# Patient Record
Sex: Male | Born: 1965
Health system: Southern US, Community
[De-identification: ages and names within clinical notes are randomized; demographics above are authoritative.]

## PROBLEM LIST (undated history)

## (undated) DIAGNOSIS — J189 Pneumonia, unspecified organism: Secondary | ICD-10-CM

## (undated) DIAGNOSIS — D649 Anemia, unspecified: Secondary | ICD-10-CM

## (undated) DIAGNOSIS — E785 Hyperlipidemia, unspecified: Secondary | ICD-10-CM

## (undated) DIAGNOSIS — Z8739 Personal history of other diseases of the musculoskeletal system and connective tissue: Secondary | ICD-10-CM

## (undated) DIAGNOSIS — L4050 Arthropathic psoriasis, unspecified: Secondary | ICD-10-CM

## (undated) HISTORY — DX: Arthropathic psoriasis, unspecified: L40.50

## (undated) HISTORY — DX: Hyperlipidemia, unspecified: E78.5

## (undated) HISTORY — DX: Personal history of other diseases of the musculoskeletal system and connective tissue: Z87.39

## (undated) HISTORY — PX: JOINT REPLACEMENT: SHX530

---

## 1999-08-06 ENCOUNTER — Emergency Department (HOSPITAL_COMMUNITY): Admission: EM | Admit: 1999-08-06 | Discharge: 1999-08-06 | Payer: Self-pay | Admitting: Emergency Medicine

## 1999-08-06 ENCOUNTER — Encounter: Payer: Self-pay | Admitting: Emergency Medicine

## 2001-05-02 DIAGNOSIS — L4050 Arthropathic psoriasis, unspecified: Secondary | ICD-10-CM | POA: Insufficient documentation

## 2004-07-29 ENCOUNTER — Encounter (INDEPENDENT_AMBULATORY_CARE_PROVIDER_SITE_OTHER): Payer: Self-pay | Admitting: *Deleted

## 2004-07-29 ENCOUNTER — Inpatient Hospital Stay (HOSPITAL_COMMUNITY): Admission: RE | Admit: 2004-07-29 | Discharge: 2004-08-02 | Payer: Self-pay | Admitting: Orthopedic Surgery

## 2004-10-04 ENCOUNTER — Inpatient Hospital Stay (HOSPITAL_COMMUNITY): Admission: RE | Admit: 2004-10-04 | Discharge: 2004-10-07 | Payer: Self-pay | Admitting: Orthopedic Surgery

## 2008-06-24 ENCOUNTER — Ambulatory Visit: Payer: Self-pay | Admitting: Family Medicine

## 2008-06-24 LAB — CONVERTED CEMR LAB
Basophils Absolute: 0 10*3/uL (ref 0.0–0.1)
Basophils Relative: 0.5 % (ref 0.0–3.0)
Bilirubin Urine: NEGATIVE
Bilirubin, Direct: 0.1 mg/dL (ref 0.0–0.3)
Calcium: 9.5 mg/dL (ref 8.4–10.5)
Chloride: 107 meq/L (ref 96–112)
Cholesterol: 201 mg/dL (ref 0–200)
Creatinine, Ser: 0.9 mg/dL (ref 0.4–1.5)
Direct LDL: 133.7 mg/dL
GFR calc non Af Amer: 97 mL/min
Glucose, Urine, Semiquant: NEGATIVE
HCT: 47.8 % (ref 39.0–52.0)
Ketones, urine, test strip: NEGATIVE
MCHC: 34.6 g/dL (ref 30.0–36.0)
MCV: 91.5 fL (ref 78.0–100.0)
Neutro Abs: 3.9 10*3/uL (ref 1.4–7.7)
Platelets: 147 10*3/uL — ABNORMAL LOW (ref 150–400)
RBC: 5.22 M/uL (ref 4.22–5.81)
Sodium: 144 meq/L (ref 135–145)
TSH: 0.81 microintl units/mL (ref 0.35–5.50)
Total CHOL/HDL Ratio: 5.4
WBC Urine, dipstick: NEGATIVE
WBC: 5.2 10*3/uL (ref 4.5–10.5)

## 2008-07-03 ENCOUNTER — Ambulatory Visit: Payer: Self-pay | Admitting: Family Medicine

## 2008-07-03 DIAGNOSIS — M87059 Idiopathic aseptic necrosis of unspecified femur: Secondary | ICD-10-CM | POA: Insufficient documentation

## 2008-07-03 DIAGNOSIS — E785 Hyperlipidemia, unspecified: Secondary | ICD-10-CM

## 2008-07-03 DIAGNOSIS — E669 Obesity, unspecified: Secondary | ICD-10-CM | POA: Insufficient documentation

## 2008-07-12 ENCOUNTER — Emergency Department (HOSPITAL_COMMUNITY): Admission: EM | Admit: 2008-07-12 | Discharge: 2008-07-12 | Payer: Self-pay | Admitting: Emergency Medicine

## 2010-03-10 ENCOUNTER — Encounter: Payer: Self-pay | Admitting: Family Medicine

## 2010-03-10 ENCOUNTER — Ambulatory Visit: Payer: Self-pay | Admitting: Family Medicine

## 2010-06-01 ENCOUNTER — Telehealth: Payer: Self-pay | Admitting: Internal Medicine

## 2010-06-01 NOTE — Assessment & Plan Note (Signed)
Summary: high bp 160/100/cjr   Vital Signs:  Patient profile:   45 year old male Weight:      236 pounds Temp:     98.4 degrees F oral BP sitting:   120 / 60  (left arm) Cuff size:   large  Vitals Entered By: Sid Falcon LPN (March 10, 2010 4:50 PM)  History of Present Illness: Patient seen with episode of atypical chest symptoms last night. This occurred at home at rest. He described "prickly sensation" anterior chest without radiation which lasted about 10 minutes. He denied any tightness, heaviness, chest wall soreness, dyspnea, nausea, or vomiting. No radiation of symptoms. No recurrent symptoms today.  Patient also noted elevated blood pressure the home machine of around 160/100 last night. His wife is an Charity fundraiser who took his blood pressure. No history of hypertension. Denies any recent headaches or dizziness. He does drink excessive caffeine with approximately 60 ounces of coffee per day. No regular alcohol use.  no family history of premature coronary artery disease. Nonsmoker. Mild dyslipidemia. No history of diabetes.  Allergies (verified): No Known Drug Allergies  Past History:  Past Medical History: Last updated: 07/03/2008 Hyperlipidemia, slight elevation Arthritis Anemia in the past avascular necrosis of the hips.  Past Surgical History: Last updated: 07/03/2008 Total hip replacement left 2006 Total hip replacement right 2007  Family History: Last updated: 07/03/2008 Glaucoma, mother father, died of head trauma complications at age 40  Social History: Last updated: 07/03/2008 Occupation:  Occupational psychologist Never Smoked Alcohol use-yes, occasionally Drug use-no Regular exercise-yes, recently started  Risk Factors: Exercise: yes (07/03/2008)  Risk Factors: Smoking Status: never (07/03/2008) PMH-FH-SH reviewed for relevance  Review of Systems  The patient denies anorexia, fever, weight loss, hoarseness, syncope, dyspnea on exertion, peripheral edema,  prolonged cough, headaches, hemoptysis, abdominal pain, melena, hematochezia, and severe indigestion/heartburn.    Physical Exam  General:  Well-developed,well-nourished,in no acute distress; alert,appropriate and cooperative throughout examination Mouth:  Oral mucosa and oropharynx without lesions or exudates.  Teeth in good repair. Neck:  No deformities, masses, or tenderness noted. Chest Wall:  No deformities, masses, tenderness or gynecomastia noted. Lungs:  Normal respiratory effort, chest expands symmetrically. Lungs are clear to auscultation, no crackles or wheezes. Heart:  Normal rate and regular rhythm. S1 and S2 normal without gallop, murmur, click, rub or other extra sounds. Abdomen:  soft, non-tender, and no masses.   Extremities:  No clubbing, cyanosis, edema, or deformity noted with normal full range of motion of all joints.     Impression & Recommendations:  Problem # 1:  CHEST PAIN, ATYPICAL (ICD-786.59) Assessment New  EKG no acute findings.  Transient elev BP by reading last night ?anxiety but normal today.  Orders: EKG w/ Interpretation (93000)  Problem # 2:  ELEVATED BLOOD PRESSURE (ICD-796.2) Assessment: New as above.  Rec gradual reduction in caffeine. Continue to monitor.  Patient Instructions: 1)  Your blood pressure is normal today 120/60. 2)  EKG shows no acute findings. 3)  Follow up promptly if you notice any exertion related chest pain or increased shortness of breath.   Orders Added: 1)  Est. Patient Level IV [61607] 2)  EKG w/ Interpretation [93000]

## 2010-06-02 ENCOUNTER — Telehealth: Payer: Self-pay | Admitting: *Deleted

## 2010-06-02 NOTE — Telephone Encounter (Signed)
Spoke with wife.  He apparently had Tamiflu called in last night though Hasn't started today.  He worked today.  Advised to f/u here if symptoms worsen or persist

## 2010-06-02 NOTE — Telephone Encounter (Signed)
[facility] Triage Call Report Triage Record Num: [id number] Operator: Martie Lee Long Patient Name: [name] Call Date & Time: 06/01/2010 6:01:33PM Patient Phone: [phone] PCP: Evelena Peat Patient Gender: Male PCP Fax : [phone] Patient DOB: 21-Dec-1965 Practice Name: [facility] Reason for Call: Judeth Porch calling about fever, body aches and headaches. Onset 05/31/10. Temp has not been taken. Wife informed that she is not currently at home with patient. Instructed that triage needed to be while with patient. Called [name]/Pt at number given by wife [phone]) no answer, left message to call office line back. Triage not able to be completed. No triage. Protocol(s) Used: Office Note Recommended Outcome per Protocol: Information Noted and Sent to Office Reason for Outcome: Caller information to office Care Advice: ~ 06/01/2010 6:13:50PM Page 1 of 1 [email or web address]_[email or web address]

## 2010-06-09 NOTE — Progress Notes (Signed)
  Phone Note Call from Patient   Caller: Dad Call For: Evelena Peat MD Summary of Call: Karin Golden Friendly Feels like he is getting the flu........Marland Kitchenfever last night, doesn't know how high, and headache with mylagias.  Wants Tamiflu. [phone]  Initial call taken by: Lynann Beaver CMA AAMA,  June 01, 2010 4:48 PM  Follow-up for Phone Call        75 mg  #10 Follow-up by: Gordy Savers  MD,  June 02, 2010 7:32 AM    New/Updated Medications: TAMIFLU 75 MG CAPS (OSELTAMIVIR PHOSPHATE) one by mouth daily Prescriptions: TAMIFLU 75 MG CAPS (OSELTAMIVIR PHOSPHATE) one by mouth daily  #10 x 0   Entered by:   Lynann Beaver CMA AAMA   Authorized by:   Gordy Savers  MD   Signed by:   Lynann Beaver CMA AAMA on 06/02/2010   Method used:   Electronically to        [facility] (retail)       [address]       [place], [place]  [address]       Ph: [phone]       Fax: [phone]   RxID:   [id number]  Left pt message.

## 2010-09-17 NOTE — Op Note (Signed)
[name], [name]                ACCOUNT NO.:  [id number]   MEDICAL RECORD NO.:  [id number]          PATIENT TYPE:  INP   LOCATION:  X002                         FACILITY:  [facility]   PHYSICIAN:  Ollen Gross, M.D.    DATE OF BIRTH:  01/17/1966   DATE OF PROCEDURE:  07/29/2004  DATE OF DISCHARGE:                                 OPERATIVE REPORT   PREOPERATIVE DIAGNOSIS:  Osteoarthritis left hip.   POSTOPERATIVE DIAGNOSIS:  Osteoarthritis left hip.   PROCEDURE:  Left total hip arthroplasty.   SURGEON:  Ollen Gross, M.D.   ASSISTANT:  Alexzandrew L. Julien Girt, P.A.   ANESTHESIA:  Spinal, which was then converted to general.   ESTIMATED BLOOD LOSS:  200.   DRAIN:  Hemovac x1.   COMPLICATIONS:  None.   CONDITION:  Stable to recovery.   BRIEF CLINICAL NOTE:  [name] is a 45 year old male who has severe arthritic  change of both hips.  He has a history of rheumatoid arthritis.  He has end-  stage arthritic change and has had intractable pain.  He presents now for  left total hip arthroplasty, as the left is more symptomatic than the right.   PROCEDURE IN DETAIL:  After successful administration of spinal anesthetic,  a Foley catheter was placed, and the patient was placed on the right lateral  decubitus position, with the left side up and held with the hip positioner.  Left lower extremity was isolated from his perineum with plastic drapes and  prepped and draped in the usual sterile fashion.  A short posterolateral  incision was made with a 10 blade through subcutaneous tissue.  The patient  felt the incision, and thus his anesthetic was converted to a general via  LMA.  Skin was cut to the subcutaneous tissue to the fascia lata.  The  fascia lata was incised in line with the skin incision.  Sciatic nerve was  palpated and protected, and the short external rotators isolated off the  femur.  Capsulectomy was performed, and the hip was dislocated.  The center  of the femoral  head was marked and a trial prosthesis placed such that the  center of the trial head corresponds to the center of his native femoral  head.  Osteotomy line was marked on the femoral neck, and osteotomy was made  with an oscillating saw.  The femoral head was removed and then the femur  retracted anteriorly to gain acetabular exposure.  On inspecting the femoral  head, he had barely any cartilage left, and the cartilage that was left was  stained black.  We subsequently sent the femoral head as a pathologic  specimen to analyze this.  Once acetabular exposure was obtained and the  labrum was removed, reaming was performed starting at 49 mm, coursing in  increments of 2 mm up to 59 mm.  A 60 mm Pinnacle acetabular shell was then  placed in anatomic position.  We had excellent press fit.  I did not need to  use any supplemental screws.  A trial 36 mm neutral liner was placed.  The  femur was then prepared with the canal finder and irrigation.  Axial reaming  was performed to 13.5 mm, proximal reaming to an 57F, and the sleeve machine  to a large.  An 57F large trial sleeve was placed, with an 18 x 13 stem, a  36 +8 neck, matching his native anteversion.  A 36 +0 head was placed, and  the hip was reduced, with excellent stability with full extension, full  external rotation, 70 degrees flexion, 40 degrees adduction, 90 degrees  internal rotation, and 90 degrees flexion, 90 degrees internal rotation.  Placing the left leg on top of the right, leg lengths were found to be  equal.  The hip was then dislocated, and all trials were removed.  The  permanent Apex hole eliminator was placed into the acetabular shell.  Then,  the permanent 36 mm neutral Ultamet metal liner was placed.  It was a metal  on metal hip replacement.  In the femur, the permanent 57F large sleeve was  placed with the 18 x 13 stem and a 36 +8 neck, matching his native  anteversion, 36 +0 head was placed, and the hip was reduced  with the same  stability parameters.  The wound was copiously irrigated with saline  solution, and the short rotators were reattached to the femur through drill  holes.  Fascia lata was closed over a Hemovac drain with interrupted #1  Vicryl.  Subcutaneous closed with #1 and 2-0 Vicryl, and subcuticular  running 4-0 Monocryl.  The incision was cleaned and dried, and Steri-Strips  and a bulky sterile dressing applied.  He was then placed into a knee  immobilizer, drains hooked to suction, and he was awakened and transported  to recovery in stable condition.      FA/MEDQ  D:  07/29/2004  T:  07/29/2004  Job:  [id number]

## 2010-09-17 NOTE — Discharge Summary (Signed)
NAMEKYLAN, LIBERATI                ACCOUNT NO.:  [id number]   MEDICAL RECORD NO.:  [id number]          PATIENT TYPE:  INP   LOCATION:  0469                         FACILITY:  [facility]   PHYSICIAN:  Ollen Gross, M.D.    DATE OF BIRTH:  [birth year]   DATE OF ADMISSION:  07/29/2004  DATE OF DISCHARGE:                                 DISCHARGE SUMMARY   ADMITTING DIAGNOSES:  1.  Bilateral hips end-stage osteoarthritis, left greater than right.  2.  Rheumatoid arthritis.   DISCHARGE DIAGNOSES:  1.  Osteoarthritis left hip status post left total hip replacement      arthroplasty.  2.  Bilateral hips end-stage osteoarthritis, left greater than right.      1.  Rheumatoid arthritis.   PROCEDURES:  July 29, 2004 - left total hip. Surgeon:  Dr. Lequita Halt.  Assistant:  Avel Peace, P.A.-C. Anesthesia:  Spinal, converted to general.  Blood loss 200 mL. Hemovac drain x1.   CONSULTS:  None.   BRIEF HISTORY:  Nieko is a 45 year old male with severe arthritic changes in  both hips. He has a history of rheumatoid arthritis, end-stage changes with  intractable pain, and now presents for a left total hip; left is more  symptomatic than right.   LABORATORY DATA:  CBC on admission:  Hemoglobin 15.8, hematocrit of 46.4,  white cell count 4.6, differential within normal limits. Postoperative  hemoglobin 13.8. Last noted H&H 13.1 and 37.7. PT/PTT preoperatively 11.5  and 28 respectively. INR 0.8. Last noted PT/INR 17.9 and 1.8. Chem panel on  admission all within normal limits. Serial BMETs were followed. Sodium  dropped from 135 to 133. Calcium dropped from 9.0 to 7.9. Urinalysis  preoperatively negative. Blood group/type O positive.   X-rays:  Pelvis:  Advanced severe arthritis right hip status post left total  hip arthroplasty. Left hip film:  Status post left hip arthroplasty.  Preoperative chest:  No active disease. Preoperative left hip film:  Severe  bilateral hip arthritis.   EKG dated  July 23, 2004:  Normal sinus rhythm with occasional premature  ectopic complexes, confirmed by Dr. Lenise Herald.   HOSPITAL COURSE:  Admitted to [facility], taken to the OR,  underwent the above procedure without complication. The patient tolerated  the procedure well, later to the recovery room and then the orthopedic floor  with continued postoperative care. Hemovac drain placed at the time of  surgery pulled on day #1. He was placed on PCA and p.o. analgesics, had some  pain on day #1. He was started on therapy, started to get up with therapy  very well by day #2. He was weaned over to p.o. medications and IV fluids  and PCA and Foley were discontinued. Dressing change was done on day #2.  Incision was healing well. Continued to progress well with his therapy on  day #3 and by Monday - August 02, 2004 - the patient did well with therapy  through the weekend, seen back on rounds by Dr. Lequita Halt, doing well, and was  discharged home.   DISCHARGE MEDICATIONS/PLAN:  1.  Discharged  home on August 02, 2004.  2.  Discharge diagnoses:  Please see above.  3.  Discharge medications:  Coumadin, Percocet, Robaxin. Recommend over-the-      counter Colace.  4.  Follow up 2 weeks from surgery.  5.  Activity:  Partial weightbearing left lower extremity 25-50%. Home      health PT, home health nursing. Total hip protocol.   FOLLOW-UP:  Two weeks from the day of surgery. Please contact the office at  [phone].   DIET:  As tolerated.   DISPOSITION:  Home.   CONDITION UPON DISCHARGE:  Improved.      ALP/MEDQ  D:  08/02/2004  T:  08/02/2004  Job:  [id number]

## 2010-09-17 NOTE — Op Note (Signed)
[name], [name]                ACCOUNT NO.:  [id number]   MEDICAL RECORD NO.:  [id number]          PATIENT TYPE:  INP   LOCATION:  0009                         FACILITY:  [facility]   PHYSICIAN:  Ollen Gross, M.D.    DATE OF BIRTH:  20-May-1965   DATE OF PROCEDURE:  10/04/2004  DATE OF DISCHARGE:                                 OPERATIVE REPORT   PREOPERATIVE DIAGNOSIS:  Rheumatoid arthritis of the right hip.   POSTOPERATIVE DIAGNOSIS:  Rheumatoid arthritis of the right hip.   PROCEDURE:  Right total hip arthroplasty.   SURGEON:  Ollen Gross, M.D.   ASSISTANT:  Alexzandrew L. Julien Girt, P.A.   ANESTHESIA:  General.   ESTIMATED BLOOD LOSS:  450.   DRAIN:  Hemovac x1.   COMPLICATIONS:  None.   CONDITION ON DISCHARGE:  Stable to recovery.   BRIEF CLINICAL NOTE:  [name] is a 45 year old male who has had severe end-  stage arthritis of both hips with intractable pain. He has had a successful  left total hip arthroplasty approximately two months ago and presents now  for right total hip arthroplasty.   PROCEDURE IN DETAIL:  After successful administration of general anesthetic,  the patient was placed in the left lateral decubitus position with his right  side up and held with the hip positioner. The right lower extremity was  isolated from his perineum with plastic drapes and prepped and draped in the  usual sterile fashion. A short posterolateral incision was made with a 10  blade through subcutaneous tissue to the level of fascia lata which was  incised in line with the skin incision. The sciatic nerve was palpated and  protected, and the short rotators isolated off the femur. Capsulectomy was  performed, and the hip was dislocated. The center of the femoral head was  marked and a trial prosthesis placed at the center of the trial head and  corresponds to the center of his native femoral head. Osteotomy lines were  marked on the femoral neck, and osteotomy made with an  oscillating saw. The  femur was retracted anteriorly to gain acetabular exposure.   Acetabular osteophytes and labrum were removed. Reaming starts at 53 in  coursing increments up to a 59 and then a 60-mm Pinnacle acetabular shell  was placed in anatomic position and transfixed with two dome screws. A trial  36-mm neutral liner was placed.   The femur was then prepared with the canal finder and irrigation. Axial  reaming was performed to 13.5 mm. Proximal reaming was then performed to a  18 and the sleeve machine to a large.  An 18-F large trial sleeve was placed  and an 18 x 13 stem with 36+8 neck matching his native anteversion. A 36-0  head was placed, and the hip was reduced with outstanding stability. Full  extension, full external rotation to 70 degrees, flexion to 40 degrees  ,adduction to 90 degrees, and  internal rotation to 90 degrees, flexion to  70 degrees internal rotation. The hip was then dislocated and the trials  removed. The 36-mm neutral Ultamet metal liner  was placed. It was a metal-on-  metal hip replacement. The 18-F large sleeve was then placed in the proximal  femur with an 18 x 13 stem and a 36+8 neck matching his native anteversion.  The permanent 36+0 head was then placed. The hip was reduced with the same  stability parameters. The wound was copiously irrigated with saline solution  and the short rotators reattached to the femur with drill holes. The fascia  was closed over a Hemovac drain with interrupted #1 Vicryl, subcutaneous  tissue closed with #1 then 2-0 Vicryl and subcuticular running 4-0 Monocryl.  The incision was cleaned and dried. Thirty cc of 0.25% Marcaine with  epinephrine were injected into the subcutaneous tissues. Steri-Strips and a  bulky sterile dressing were applied. Drain was hooked to suction. He was  then placed into a knee immobilizer, awakened, and transported to recovery  in stable condition.       FA/MEDQ  D:  10/04/2004  T:   10/04/2004  Job:  [id number]

## 2010-09-17 NOTE — H&P (Signed)
[name], Joshua                ACCOUNT NO.:  [id number]   MEDICAL RECORD NO.:  [id number]          PATIENT TYPE:  INP   LOCATION:  NA                           FACILITY:  [facility]   PHYSICIAN:  Ollen Gross, M.D.    DATE OF BIRTH:  11/10/1965   DATE OF ADMISSION:  07/29/2004  DATE OF DISCHARGE:                                HISTORY & PHYSICAL   REASON FOR ADMISSION:  Left hip pain.   HISTORY OF PRESENT ILLNESS:  [name] is a 45 year old male who has a 3-4 year  history of progressively worsening dysfunction in regard to his hips.  Currently his left hip is hurting more than the right. He never had any  specific injury leading to this. He has a history of rheumatoid arthritis.  He has developed significant stiffness in his hips. It is difficult for him  to do things such as tie his shoes, get in and out of the car, go up and  down stairs.  Stiffness is more limiting in pain originally but now the pain  is just as bad as the stiffness. 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  He is at a stage  now where it is negatively affecting his life and he is here to pursue total  hip arthroplasty.   PAST MEDICAL HISTORY:  Significant for rheumatoid arthritis.   PAST SURGICAL HISTORY:  Noncontributory.   CURRENT MEDICATIONS:  Daypro.   ALLERGIES:  He has no known drug allergies.   FAMILY HISTORY:  Not significant for any inflammatory arthritis or arthritis  at an early age.   SOCIAL HISTORY:  He is married, lives in a townhome with two floors and 14  stairs.   REVIEW OF SYMPTOMS:  Negative with the exception of one bout of pneumonia  several years ago.   PHYSICAL EXAMINATION:  GENERAL:  Well-developed male, alert and oriented in  no apparent distress.  VITAL SIGNS:  Temperature 98.6, pulse 75, respirations 16, blood pressure  120/80.  HEENT:  Within normal limits.  NECK:  Supple without adenopathy or thyromegaly.  LUNGS:   Clear to auscultation bilaterally.  CARDIAC:  Shows a regular rate and rhythm, no murmurs, rubs or gallops.  ABDOMEN:  Nontender, nondistended with positive bowel sounds and no  organomegaly.  MUSCULOSKELETAL:  Specifically the hips shows right hip flexion to 80, no  internal rotation, 20 degrees external rotation, 20 degrees of abduction.  Left hip flexion is 80, no internal or external rotation only 20 degrees of  abduction. He has intact strength, sensation and pulses both lower  extremities. Walks with a very stiff legged gait pattern.   Radiographs show an AP pelvis and lateral of his hips, end-stage arthritis  both hips, bone on bone throughout.   ASSESSMENT:  Bilateral hip pain, he has got end-stage arthritis of both  hips. At this point, the most predictable means of improving pain and  function will be a total hip arthroplasty. The left is currently more  symptomatic than the right. He presents now  for left total hip arthroplasty.  The procedure risks, potential complications and rehab course have been  discussed in detail and he elects to proceed.      FA/MEDQ  D:  07/09/2004  T:  07/09/2004  Job:  [id number]

## 2010-09-17 NOTE — H&P (Signed)
[name], [name]                ACCOUNT NO.:  [id number]   MEDICAL RECORD NO.:  [id number]          PATIENT TYPE:  INP   LOCATION:  NA                           [facility]ACILITY:  [facility]   PHYSICIAN:  Ollen Gross, M.D.    DATE OF BIRTH:  [birth year]   DATE OF ADMISSION:  10/04/2004  DATE OF DISCHARGE:                                HISTORY & PHYSICAL   CHIEF COMPLAINT:  Right hip pain.   HISTORY OF PRESENT ILLNESS:  The patient is a 44 year old male who has been  seen by Dr. Lequita Halt for ongoing hip arthritis. He has previously undergone a  left total hip arthroplasty back in the end of March and the first of April  of this year and has been doing quite well with regards to the left hip. He  has continued pain on the right side. He was known to have end stage  arthritis in both hips. He has successfully undergone a left total hip and  now presents for the right total hip replacement. Risks and benefits  discussed. The patient is subsequently admitted to the hospital.   ALLERGIES:  No known drug allergies.   CURRENT MEDICATIONS:  Tylenol.   PAST MEDICAL HISTORY:  Rheumatoid arthritis.   PAST SURGICAL HISTORY:  Left total hip arthroplasty July 29, 2004.   FAMILY HISTORY:  Mother with history of glaucoma. She is 19 years old.   SOCIAL HISTORY:  He is married. His wife works as a Engineer, civil (consulting) for [employer]. He works as a Occupational psychologist. Non-smoker. No alcohol. One child. Wife  will be assisting with care after surgery.   REVIEW OF SYSTEMS:  GENERAL:  No fever, chills, night sweats. NEUROLOGIC:  No seizures, syncope, or paralysis. RESPIRATORY:  No shortness of breath,  productive cough, or hemoptysis. CARDIOVASCULAR:  No chest pain, angina, or  orthopnea. GASTROINTESTINAL:  No nausea, vomiting, diarrhea, constipation.  GENITOURINARY:  No dysuria, hematuria, or discharge. MUSCULOSKELETAL:  Right  hip, history of present illness.   PHYSICAL EXAMINATION:  VITAL SIGNS:  Pulse 64,  respiratory rate 12, blood  pressure 122/78.  GENERAL:  A 45 year old white male, well developed, well nourished, in no  acute distress. Alert, oriented and cooperative.  HEENT:  Normocephalic and atraumatic. Pupils are equal, round, and reactive.  Oropharynx clear.  NECK:  Supple.  CHEST:  Clear anterior and posterior chest walls.  HEART:  Regular rate and rhythm. No murmur or S1 and S2 noted.  ABDOMEN:  Soft, nontender. Bowel sounds present.  RECTAL/BREAST/GENITALIA:  Not done. Not pertinent to present illness.  EXTREMITIES:  Right hip flexion of only about 85 to 90 degrees. There is no  internal rotation, about 10 degrees of external rotation, and about 10  degrees of abduction with antalgic gait.   IMPRESSION:  1.  Rheumatoid arthritis, right hip.  2.  Status post left total hip replacement arthroplasty March of 2006.   PLAN:  The patient admitted to [facility] to undergo a  right total hip arthroplasty. The surgery will be performed by Dr. Ollen Gross.  ALP/MEDQ  D:  10/03/2004  T:  10/03/2004  Job:  [id number]   cc:   [facility]

## 2011-03-31 ENCOUNTER — Other Ambulatory Visit (HOSPITAL_COMMUNITY): Payer: Self-pay | Admitting: Rheumatology

## 2011-03-31 ENCOUNTER — Ambulatory Visit (HOSPITAL_COMMUNITY)
Admission: RE | Admit: 2011-03-31 | Discharge: 2011-03-31 | Disposition: A | Discharge status: Home / Self Care | Payer: 59 | Source: Ambulatory Visit | Attending: Rheumatology | Admitting: Rheumatology

## 2011-03-31 DIAGNOSIS — Z9225 Personal history of immunosupression therapy: Secondary | ICD-10-CM

## 2011-04-01 ENCOUNTER — Other Ambulatory Visit (HOSPITAL_BASED_OUTPATIENT_CLINIC_OR_DEPARTMENT_OTHER): Payer: Self-pay | Admitting: Rheumatology

## 2011-04-01 DIAGNOSIS — M542 Cervicalgia: Secondary | ICD-10-CM

## 2011-04-05 ENCOUNTER — Other Ambulatory Visit (HOSPITAL_BASED_OUTPATIENT_CLINIC_OR_DEPARTMENT_OTHER): Payer: 59

## 2011-04-12 ENCOUNTER — Ambulatory Visit (HOSPITAL_BASED_OUTPATIENT_CLINIC_OR_DEPARTMENT_OTHER)
Admission: RE | Admit: 2011-04-12 | Discharge: 2011-04-12 | Disposition: A | Discharge status: Home / Self Care | Payer: 59 | Source: Ambulatory Visit | Attending: Rheumatology | Admitting: Rheumatology

## 2011-04-12 DIAGNOSIS — M5030 Other cervical disc degeneration, unspecified cervical region: Secondary | ICD-10-CM

## 2011-04-12 DIAGNOSIS — M542 Cervicalgia: Secondary | ICD-10-CM | POA: Insufficient documentation

## 2011-04-12 DIAGNOSIS — R209 Unspecified disturbances of skin sensation: Secondary | ICD-10-CM

## 2011-04-12 DIAGNOSIS — M5020 Other cervical disc displacement, unspecified cervical region: Secondary | ICD-10-CM | POA: Insufficient documentation

## 2011-07-22 ENCOUNTER — Other Ambulatory Visit (HOSPITAL_BASED_OUTPATIENT_CLINIC_OR_DEPARTMENT_OTHER): Payer: Self-pay | Admitting: Urology

## 2011-07-22 DIAGNOSIS — R3129 Other microscopic hematuria: Secondary | ICD-10-CM

## 2011-07-23 ENCOUNTER — Ambulatory Visit (HOSPITAL_BASED_OUTPATIENT_CLINIC_OR_DEPARTMENT_OTHER)
Admission: RE | Admit: 2011-07-23 | Discharge: 2011-07-23 | Disposition: A | Discharge status: Home / Self Care | Payer: 59 | Source: Ambulatory Visit | Attending: Urology | Admitting: Urology

## 2011-07-23 DIAGNOSIS — R3129 Other microscopic hematuria: Secondary | ICD-10-CM

## 2011-07-30 ENCOUNTER — Ambulatory Visit (HOSPITAL_BASED_OUTPATIENT_CLINIC_OR_DEPARTMENT_OTHER)
Admission: RE | Admit: 2011-07-30 | Discharge: 2011-07-30 | Disposition: A | Discharge status: Home / Self Care | Payer: 59 | Source: Ambulatory Visit | Attending: Urology | Admitting: Urology

## 2011-07-30 DIAGNOSIS — Q619 Cystic kidney disease, unspecified: Secondary | ICD-10-CM | POA: Insufficient documentation

## 2011-07-30 DIAGNOSIS — R3129 Other microscopic hematuria: Secondary | ICD-10-CM | POA: Insufficient documentation

## 2011-07-30 DIAGNOSIS — N281 Cyst of kidney, acquired: Secondary | ICD-10-CM

## 2011-07-30 DIAGNOSIS — R9389 Abnormal findings on diagnostic imaging of other specified body structures: Secondary | ICD-10-CM | POA: Insufficient documentation

## 2011-07-30 DIAGNOSIS — K5730 Diverticulosis of large intestine without perforation or abscess without bleeding: Secondary | ICD-10-CM | POA: Insufficient documentation

## 2011-07-30 DIAGNOSIS — N289 Disorder of kidney and ureter, unspecified: Secondary | ICD-10-CM

## 2011-07-30 MED ORDER — IOHEXOL 300 MG/ML  SOLN
125.0000 mL | Freq: Once | INTRAMUSCULAR | Status: AC | PRN
  Administered 2011-07-30: 125 mL via INTRAVENOUS

## 2012-04-13 ENCOUNTER — Ambulatory Visit (INDEPENDENT_AMBULATORY_CARE_PROVIDER_SITE_OTHER): Payer: Self-pay | Admitting: Pharmacist

## 2012-04-13 ENCOUNTER — Encounter: Payer: Self-pay | Admitting: Pharmacist

## 2012-04-13 VITALS — BP 157/86 | HR 73 | Ht 69.25 in | Wt 248.2 lb

## 2012-04-13 DIAGNOSIS — L4050 Arthropathic psoriasis, unspecified: Secondary | ICD-10-CM

## 2012-04-13 NOTE — Progress Notes (Signed)
  Subjective:    Patient ID: [name], male    DOB: May 19, 1965, 46 y.o.   MRN: [id number]  HPI Patient arrives in good spirits for medication review.  He is seen late in the day at 5:15PM as he is self-employed and finds it difficult to come in during the business day.  Reports seeing Evelena Peat as primary care provider at [facility], Dr. Titus Dubin as rheumatologist.  Reports being diagnosed with psoriatic arthritis for about 10 years. He had previously been diagnosed with rheumatoid arthritis since his teens.   He states he is currently under an acceptable level of control.      Review of Systems     Objective:   Physical Exam        Assessment & Plan:  Following medication review, no suggestions for change.  Complete medication list provided to patient.  Total time in face to face medication review:  15 minutes.  Patient seen with: Juanita Craver, PharmD Candidate.

## 2012-04-13 NOTE — Patient Instructions (Signed)
Thanks for coming in today.   Your medications should be eligible for lower cost after this visit.

## 2012-04-16 NOTE — Progress Notes (Signed)
  Subjective:    Patient ID: [name], male    DOB: 02-17-1966, 46 y.o.   MRN: [id number]  HPI Reviewed and agree with Dr. Macky Lower documentation and management.   Review of Systems     Objective:   Physical Exam        Assessment & Plan:

## 2012-07-03 ENCOUNTER — Encounter (INDEPENDENT_AMBULATORY_CARE_PROVIDER_SITE_OTHER): Payer: Self-pay | Admitting: General Surgery

## 2012-07-03 ENCOUNTER — Ambulatory Visit (INDEPENDENT_AMBULATORY_CARE_PROVIDER_SITE_OTHER): Payer: Commercial Managed Care - PPO | Admitting: General Surgery

## 2012-07-03 VITALS — BP 138/84 | HR 68 | Temp 97.1°F | Resp 16 | Ht 69.0 in | Wt 238.4 lb

## 2012-07-03 DIAGNOSIS — L0231 Cutaneous abscess of buttock: Secondary | ICD-10-CM

## 2012-07-03 MED ORDER — SULFAMETHOXAZOLE-TRIMETHOPRIM 800-160 MG PO TABS: 1.0000 | ORAL_TABLET | Freq: Two times a day (BID) | ORAL | Status: AC

## 2012-07-03 NOTE — Progress Notes (Signed)
Subjective:     Patient ID: [name], male   DOB: Sep 07, 1965, 47 y.o.   MRN: [id number]  HPI   Review of Systems  Constitutional: Negative.   HENT: Negative.   Respiratory: Negative.   Cardiovascular: Negative.   Gastrointestinal: Negative.   Neurological: Negative.        Objective:   Physical Exam  Constitutional: He is oriented to person, place, and time. He appears well-developed and well-nourished.  HENT:  Head: Normocephalic and atraumatic.  Eyes: Conjunctivae and EOM are normal. Pupils are equal, round, and reactive to light.  Neck: Normal range of motion. Neck supple.  Cardiovascular: Normal rate, regular rhythm and normal heart sounds.   Pulmonary/Chest: Effort normal and breath sounds normal.  Abdominal: Soft. Bowel sounds are normal. He exhibits no distension and no mass. There is no tenderness. There is no rebound and no guarding.  Musculoskeletal: Normal range of motion.  Neurological: He is alert and oriented to person, place, and time.  Skin:          Assessment:     47 year old male with a right gluteal cleft abscess     Plan:     1.  I will prescribe the patient a prescription for Bactrim for 10 days. 2. The patient follow up in 10-14 days.

## 2012-07-07 ENCOUNTER — Ambulatory Visit (INDEPENDENT_AMBULATORY_CARE_PROVIDER_SITE_OTHER): Payer: 59 | Admitting: Family Medicine

## 2012-07-07 ENCOUNTER — Encounter: Payer: Self-pay | Admitting: Family Medicine

## 2012-07-07 VITALS — BP 154/84 | HR 84 | Temp 97.1°F | Wt 238.0 lb

## 2012-07-07 DIAGNOSIS — J209 Acute bronchitis, unspecified: Secondary | ICD-10-CM

## 2012-07-07 DIAGNOSIS — J069 Acute upper respiratory infection, unspecified: Secondary | ICD-10-CM

## 2012-07-07 MED ORDER — AZITHROMYCIN 250 MG PO TABS: ORAL_TABLET | ORAL | Status: DC

## 2012-07-07 MED ORDER — MOMETASONE FURO-FORMOTEROL FUM 200-5 MCG/ACT IN AERO: INHALATION_SPRAY | RESPIRATORY_TRACT | Status: DC

## 2012-07-07 MED ORDER — HYDROCODONE-HOMATROPINE 5-1.5 MG/5ML PO SYRP: ORAL_SOLUTION | ORAL | Status: DC

## 2012-07-07 NOTE — Progress Notes (Signed)
OFFICE NOTE  07/07/2012  CC:  Chief Complaint  Patient presents with  . Pt here with c/o cough with head and chest congestion interm     HPI: Patient is a 47 y.o. Caucasian male who is here for head congestion and cough. Onset was around a month ago, waxing and waning sx's/severity, coughing makes head and chest hurt.   No recent fevers.  No wheezing.  Cough comes in "fits" with some clear mucous production.  Has thicker mucous in nose. Saw rheumatologist last week but the current illness was not addressed. Has been trying dayquil and emergen-C, mucinex DM.   Pertinent PMH:  Past Medical History  Diagnosis Date  . Psoriatic arthritis   . History of avascular necrosis of capital femoral epiphysis 2006, 2007    Bilateral Femoral Head  . HLD (hyperlipidemia)    Past Surgical History  Procedure Laterality Date  . Joint replacement Bilateral 2006, 2007    Necrosis femoral head (bilateral)    MEDS:  Outpatient Prescriptions Prior to Visit  Medication Sig Dispense Refill  . acetaminophen (TYLENOL) 500 MG tablet Take 500 mg by mouth every 6 (six) hours as needed.      . etanercept (ENBREL) 50 MG/ML injection Inject 0.98 mLs (50 mg total) into the skin once a week.      . Naproxen Sodium 220 MG CAPS Take 1 capsule by mouth as needed.       No facility-administered medications prior to visit.  **Pt's last dose of enbrel was about 5 wks ago.  PE: Blood pressure 154/84, pulse 84, temperature 97.1 F (36.2 C), temperature source Oral, weight 238 lb (107.956 kg), SpO2 95.00%. Gen: alert, appears weary/tired.  In NAD.  Still smiles and interacts fine. HEENT: eyes without injection, drainage, or swelling.  Ears: EACs clear, TMs with normal light reflex and landmarks.  Nose: Clear rhinorrhea, with some dried, crusty exudate adherent to mildly injected mucosa.  No purulent d/c.  No paranasal sinus TTP.  No facial swelling.  Throat and mouth without focal lesion.  No pharyngial swelling,  erythema, or exudate.   Neck: supple, no LAD.   LUNGS: CTA bilat, nonlabored resps.   CV: RRR, no m/r/g. EXT: no c/c/e   IMPRESSION AND PLAN:  Acute bronchitis He has had a prolonged URI that is possibly acute bacterial sinusitis at this point. Also with LRTI, but no objective signs of pneumonia/consolidation.  Will treat with Z-pack, dulera 200/5 inhaler 2 puffs bid, and hycodan 1-2 tsp q6h prn. Therapeutic expectations and side effect profiles of medications discussed today.  Patient's questions answered. Signs/symptoms to call or return for were reviewed and pt expressed understanding.    An After Visit Summary was printed and given to the patient.  FOLLOW UP: 1 wk with Dr. Caryl Never.

## 2012-07-07 NOTE — Assessment & Plan Note (Signed)
He has had a prolonged URI that is possibly acute bacterial sinusitis at this point. Also with LRTI, but no objective signs of pneumonia/consolidation.  Will treat with Z-pack, dulera 200/5 inhaler 2 puffs bid, and hycodan 1-2 tsp q6h prn. Therapeutic expectations and side effect profiles of medications discussed today.  Patient's questions answered. Signs/symptoms to call or return for were reviewed and pt expressed understanding.

## 2012-07-09 ENCOUNTER — Telehealth: Payer: Self-pay | Admitting: Family Medicine

## 2012-07-09 NOTE — Telephone Encounter (Signed)
[facility] Triage Call Report Triage Record Num: [id number] Operator: Tomasita Crumble Patient Name: [name] Call Date & Time: 07/07/2012 9:56:17AM Patient Phone: [phone] PCP: Evelena Peat Patient Gender: Male PCP Fax : [phone] Patient DOB: [birth year] Practice Name: [facility] Reason for Call: Spouse/ Maryann calling about [place]. States he has Cold for estimated 4 weeks. Cough with chest pain worsening and history of pneumonia. Currently afebrile. "Sharp, fleeting chest pain only occurring with deep breath or cough AND not responsive to home care" triggers See Provider within 24 hours per Cough protocol. Appointment at 11:15 with Dr. Dan Humphreys at [facility] Saturday clinic as caller states 1 hour travel time needed. Protocol(s) Used: Cough - Adult Recommended Outcome per Protocol: See Provider within 24 hours Reason for Outcome: Sharp, fleeting chest pain only occurring with deep breath or cough AND not responsive to home care Care Advice: ~ 03/

## 2012-07-13 ENCOUNTER — Encounter: Payer: Self-pay | Admitting: Family Medicine

## 2012-07-13 ENCOUNTER — Ambulatory Visit (INDEPENDENT_AMBULATORY_CARE_PROVIDER_SITE_OTHER): Payer: 59 | Admitting: Family Medicine

## 2012-07-13 VITALS — BP 138/88 | HR 78 | Temp 98.2°F | Wt 235.0 lb

## 2012-07-13 DIAGNOSIS — R059 Cough, unspecified: Secondary | ICD-10-CM

## 2012-07-13 DIAGNOSIS — R05 Cough: Secondary | ICD-10-CM

## 2012-07-13 NOTE — Patient Instructions (Addendum)
Take Advair one puff twice daily Let's plan to stop inhaler after 2 more weeks if cough resolved.

## 2012-07-13 NOTE — Progress Notes (Signed)
  Subjective:    Patient ID: [name], male    DOB: 11-09-1965, 47 y.o.   MRN: [id number]  HPI Patient seen recent Saturday visit to [facility]. Nonsmoker but had several weeks, if not months, of coughing He was placed on Zithromax and Dulera inhaler along with Hycodan cough syrup Dramatically improved. Cough has all but ceased. No fever. No hemoptysis. No pleuritic pain. He does take Enbrel for psoriatic arthritis per rheumatology  Patient did have some gagging while taking Dulera.   Review of Systems  Constitutional: Negative for fever, chills, appetite change and unexpected weight change.  HENT: Negative for congestion.   Respiratory: Negative for shortness of breath and wheezing.   Cardiovascular: Negative for chest pain.       Objective:   Physical Exam  Constitutional: He appears well-developed and well-nourished.  HENT:  Right Ear: External ear normal.  Left Ear: External ear normal.  Mouth/Throat: Oropharynx is clear and moist.  Neck: Neck supple.  Cardiovascular: Normal rate and regular rhythm.   Pulmonary/Chest: Effort normal and breath sounds normal. No respiratory distress. He has no rales. He exhibits no tenderness.  Couple of very faint wheezes noted  Lymphadenopathy:    He has no cervical adenopathy.         Assessment & Plan:   recent cough. Question infectious. Question reactive airway component.  Improved following antibiotic and steroid inhaler. Provided samples of Advair 250/50 milligrams one puff twice daily. Consider discontinuing in 2-3 weeks if symptoms fully resolved. Followup for any recurrent cough.  He will D/C [facility] which caused some "gagging".

## 2013-07-10 ENCOUNTER — Ambulatory Visit (INDEPENDENT_AMBULATORY_CARE_PROVIDER_SITE_OTHER): Payer: 59 | Admitting: Family Medicine

## 2013-07-10 ENCOUNTER — Encounter: Payer: Self-pay | Admitting: Family Medicine

## 2013-07-10 VITALS — BP 132/78 | HR 72 | Temp 97.8°F | Wt 242.0 lb

## 2013-07-10 DIAGNOSIS — J019 Acute sinusitis, unspecified: Secondary | ICD-10-CM

## 2013-07-10 MED ORDER — AMOXICILLIN-POT CLAVULANATE 875-125 MG PO TABS: 1.0000 | ORAL_TABLET | Freq: Two times a day (BID) | ORAL | Status: DC

## 2013-07-10 NOTE — Progress Notes (Signed)
   Subjective:    Patient ID: [name], male    DOB: [birth year], 48 y.o.   MRN: [id number]  Cough Pertinent negatives include no chills or fever.   Patient is seen for acute visit. Onset over a month ago of URI symptoms. He presents now with persistent nasal congestion, intermittent mild headaches, greenish and occasional bloody nasal discharge. He's had occasional nonproductive cough. Intermittent malaise. Denies any sore throat. He's tried saline nasal irrigation without much improvement. He has history of psoriatic arthritis and takes Enbrel though he has not taken any injections over the past week.  Past Medical History  Diagnosis Date  . Psoriatic arthritis   . History of avascular necrosis of capital femoral epiphysis 2006, 2007    Bilateral Femoral Head  . HLD (hyperlipidemia)    Past Surgical History  Procedure Laterality Date  . Joint replacement Bilateral 2006, 2007    Necrosis femoral head (bilateral)    reports that he has never smoked. He has never used smokeless tobacco. He reports that he drinks alcohol. He reports that he does not use illicit drugs. family history is not on file. Allergies  Allergen Reactions  . Gold-Containing Drug Products Rash    Denies oral or airway involvement - occurred in the 1980s.       Review of Systems  Constitutional: Positive for fatigue. Negative for fever and chills.  HENT: Positive for congestion and sinus pressure.   Respiratory: Positive for cough.        Objective:   Physical Exam  Constitutional: He appears well-developed and well-nourished.  HENT:  Right Ear: External ear normal.  Left Ear: External ear normal.  Mouth/Throat: Oropharynx is clear and moist.  Neck: Neck supple.  Cardiovascular: Normal rate.   Pulmonary/Chest: Effort normal and breath sounds normal. No respiratory distress. He has no wheezes. He has no rales.  Lymphadenopathy:    He has no cervical adenopathy.          Assessment & Plan:    Acute sinusitis. Augmentin 875 mg twice a day for 10 days. Followup as needed

## 2013-07-10 NOTE — Patient Instructions (Signed)

## 2013-07-10 NOTE — Progress Notes (Signed)
Pre visit review using our clinic review tool, if applicable. No additional management support is needed unless otherwise documented below in the visit note. 

## 2013-07-15 ENCOUNTER — Telehealth: Payer: Self-pay | Admitting: Family Medicine

## 2013-07-15 MED ORDER — BENZONATATE 200 MG PO CAPS: 200.0000 mg | ORAL_CAPSULE | Freq: Three times a day (TID) | ORAL | Status: DC | PRN

## 2013-07-15 NOTE — Telephone Encounter (Signed)
RX sent to pharmacy and pt is aware 

## 2013-07-15 NOTE — Telephone Encounter (Signed)
Tessalon Perles 200 mg one every 8 hours as needed for cough #30 with no refill

## 2013-07-15 NOTE — Telephone Encounter (Signed)
Pt would like a cough med rx. Pt was seen on 07-10-13

## 2013-12-03 ENCOUNTER — Other Ambulatory Visit (HOSPITAL_COMMUNITY): Payer: Self-pay | Admitting: Orthopedic Surgery

## 2013-12-03 DIAGNOSIS — M545 Low back pain, unspecified: Secondary | ICD-10-CM

## 2013-12-21 ENCOUNTER — Ambulatory Visit (HOSPITAL_BASED_OUTPATIENT_CLINIC_OR_DEPARTMENT_OTHER): Admission: RE | Admit: 2013-12-21 | Payer: 59 | Source: Ambulatory Visit

## 2013-12-21 ENCOUNTER — Ambulatory Visit (HOSPITAL_BASED_OUTPATIENT_CLINIC_OR_DEPARTMENT_OTHER)
Admission: RE | Admit: 2013-12-21 | Discharge: 2013-12-21 | Disposition: A | Discharge status: Home / Self Care | Payer: 59 | Source: Ambulatory Visit | Attending: Orthopedic Surgery | Admitting: Orthopedic Surgery

## 2013-12-21 DIAGNOSIS — M47817 Spondylosis without myelopathy or radiculopathy, lumbosacral region: Secondary | ICD-10-CM | POA: Insufficient documentation

## 2013-12-21 DIAGNOSIS — M545 Low back pain, unspecified: Secondary | ICD-10-CM

## 2013-12-24 ENCOUNTER — Ambulatory Visit (HOSPITAL_COMMUNITY): Payer: 59

## 2014-01-15 ENCOUNTER — Other Ambulatory Visit (HOSPITAL_COMMUNITY): Payer: Self-pay | Admitting: Orthopedic Surgery

## 2014-01-15 DIAGNOSIS — M25552 Pain in left hip: Secondary | ICD-10-CM

## 2014-01-15 DIAGNOSIS — M25551 Pain in right hip: Secondary | ICD-10-CM

## 2014-01-29 ENCOUNTER — Ambulatory Visit (HOSPITAL_COMMUNITY): Payer: 59

## 2014-02-10 ENCOUNTER — Ambulatory Visit (HOSPITAL_COMMUNITY): Admission: RE | Admit: 2014-02-10 | Payer: 59 | Source: Ambulatory Visit

## 2014-03-17 ENCOUNTER — Other Ambulatory Visit (HOSPITAL_COMMUNITY): Payer: Self-pay | Admitting: Orthopedic Surgery

## 2014-03-17 DIAGNOSIS — M25551 Pain in right hip: Secondary | ICD-10-CM

## 2014-03-26 ENCOUNTER — Ambulatory Visit (HOSPITAL_COMMUNITY)
Admission: RE | Admit: 2014-03-26 | Discharge: 2014-03-26 | Disposition: A | Discharge status: Home / Self Care | Payer: 59 | Source: Ambulatory Visit | Attending: Orthopedic Surgery | Admitting: Orthopedic Surgery

## 2014-03-26 DIAGNOSIS — M545 Low back pain: Secondary | ICD-10-CM | POA: Insufficient documentation

## 2014-03-26 DIAGNOSIS — M89552 Osteolysis, left thigh: Secondary | ICD-10-CM | POA: Insufficient documentation

## 2014-03-26 DIAGNOSIS — Z96643 Presence of artificial hip joint, bilateral: Secondary | ICD-10-CM | POA: Diagnosis not present

## 2014-03-26 DIAGNOSIS — L4050 Arthropathic psoriasis, unspecified: Secondary | ICD-10-CM | POA: Diagnosis not present

## 2014-03-26 DIAGNOSIS — M25551 Pain in right hip: Secondary | ICD-10-CM | POA: Insufficient documentation

## 2014-03-28 ENCOUNTER — Ambulatory Visit (HOSPITAL_COMMUNITY): Payer: 59

## 2014-03-28 ENCOUNTER — Other Ambulatory Visit (HOSPITAL_COMMUNITY): Payer: 59

## 2014-08-26 ENCOUNTER — Other Ambulatory Visit: Payer: Self-pay | Admitting: Family Medicine

## 2014-08-26 ENCOUNTER — Telehealth: Payer: Self-pay | Admitting: Family Medicine

## 2014-08-26 DIAGNOSIS — Z0000 Encounter for general adult medical examination without abnormal findings: Secondary | ICD-10-CM

## 2014-08-26 NOTE — Telephone Encounter (Signed)
Labs are ordered 

## 2014-08-26 NOTE — Telephone Encounter (Signed)
Patient is scheduled for CPX on 09/22/14 and his wife states he would like to go to [place] to have labs drawn.  Can you please enter the orders?

## 2014-09-15 ENCOUNTER — Other Ambulatory Visit (INDEPENDENT_AMBULATORY_CARE_PROVIDER_SITE_OTHER): Payer: 59

## 2014-09-15 DIAGNOSIS — Z0000 Encounter for general adult medical examination without abnormal findings: Secondary | ICD-10-CM | POA: Diagnosis not present

## 2014-09-15 LAB — CBC WITH DIFFERENTIAL/PLATELET
BASOS PCT: 0.4 % (ref 0.0–3.0)
Basophils Absolute: 0 10*3/uL (ref 0.0–0.1)
EOS PCT: 1.5 % (ref 0.0–5.0)
Eosinophils Absolute: 0.1 10*3/uL (ref 0.0–0.7)
HCT: 47.9 % (ref 39.0–52.0)
Hemoglobin: 16.3 g/dL (ref 13.0–17.0)
LYMPHS PCT: 30.7 % (ref 12.0–46.0)
Lymphs Abs: 1.5 10*3/uL (ref 0.7–4.0)
MCHC: 34 g/dL (ref 30.0–36.0)
MCV: 91.1 fl (ref 78.0–100.0)
Monocytes Absolute: 0.2 10*3/uL (ref 0.1–1.0)
Monocytes Relative: 4.1 % (ref 3.0–12.0)
NEUTROS PCT: 63.3 % (ref 43.0–77.0)
Neutro Abs: 3.1 10*3/uL (ref 1.4–7.7)
Platelets: 136 10*3/uL — ABNORMAL LOW (ref 150.0–400.0)
RBC: 5.26 Mil/uL (ref 4.22–5.81)
RDW: 13.8 % (ref 11.5–15.5)
WBC: 4.9 10*3/uL (ref 4.0–10.5)

## 2014-09-15 LAB — BASIC METABOLIC PANEL
BUN: 21 mg/dL (ref 6–23)
CALCIUM: 9.8 mg/dL (ref 8.4–10.5)
CO2: 28 mEq/L (ref 19–32)
Chloride: 103 mEq/L (ref 96–112)
Creatinine, Ser: 1.06 mg/dL (ref 0.40–1.50)
GFR: 78.27 mL/min (ref >60.00)
GLUCOSE: 101 mg/dL — AB (ref 70–99)
Potassium: 4.3 mEq/L (ref 3.5–5.1)
SODIUM: 139 meq/L (ref 135–145)

## 2014-09-15 LAB — LIPID PANEL
CHOLESTEROL: 240 mg/dL — AB (ref 0–200)
HDL: 52.7 mg/dL (ref >39.00)
LDL CALC: 173 mg/dL — AB (ref 0–99)
NonHDL: 187.3
Total CHOL/HDL Ratio: 5
Triglycerides: 70 mg/dL (ref 0.0–149.0)
VLDL: 14 mg/dL (ref 0.0–40.0)

## 2014-09-15 LAB — HEPATIC FUNCTION PANEL
ALT: 33 U/L (ref 0–53)
AST: 22 U/L (ref 0–37)
Albumin: 4.1 g/dL (ref 3.5–5.2)
Alkaline Phosphatase: 91 U/L (ref 39–117)
Bilirubin, Direct: 0.1 mg/dL (ref 0.0–0.3)
TOTAL PROTEIN: 7.1 g/dL (ref 6.0–8.3)
Total Bilirubin: 0.6 mg/dL (ref 0.2–1.2)

## 2014-09-15 LAB — PSA: PSA: 0.67 ng/mL (ref 0.10–4.00)

## 2014-09-22 ENCOUNTER — Ambulatory Visit (INDEPENDENT_AMBULATORY_CARE_PROVIDER_SITE_OTHER): Payer: 59 | Admitting: Family Medicine

## 2014-09-22 ENCOUNTER — Encounter: Payer: Self-pay | Admitting: Family Medicine

## 2014-09-22 VITALS — BP 130/80 | HR 74 | Temp 97.9°F | Ht 69.0 in | Wt 246.0 lb

## 2014-09-22 DIAGNOSIS — R7309 Other abnormal glucose: Secondary | ICD-10-CM

## 2014-09-22 DIAGNOSIS — Z23 Encounter for immunization: Secondary | ICD-10-CM | POA: Diagnosis not present

## 2014-09-22 DIAGNOSIS — E785 Hyperlipidemia, unspecified: Secondary | ICD-10-CM | POA: Diagnosis not present

## 2014-09-22 DIAGNOSIS — Z0000 Encounter for general adult medical examination without abnormal findings: Secondary | ICD-10-CM

## 2014-09-22 DIAGNOSIS — R7303 Prediabetes: Secondary | ICD-10-CM | POA: Insufficient documentation

## 2014-09-22 NOTE — Patient Instructions (Signed)
Fat and Cholesterol Control Diet Fat and cholesterol levels in your blood and organs are influenced by your diet. High levels of fat and cholesterol may lead to diseases of the heart, small and large blood vessels, gallbladder, liver, and pancreas. CONTROLLING FAT AND CHOLESTEROL WITH DIET Although exercise and lifestyle factors are important, your diet is key. That is because certain foods are known to raise cholesterol and others to lower it. The goal is to balance foods for their effect on cholesterol and more importantly, to replace saturated and trans fat with other types of fat, such as monounsaturated fat, polyunsaturated fat, and omega-3 fatty acids. On average, a person should consume no more than 15 to 17 g of saturated fat daily. Saturated and trans fats are considered "bad" fats, and they will raise LDL cholesterol. Saturated fats are primarily found in animal products such as meats, butter, and cream. However, that does not mean you need to give up all your favorite foods. Today, there are good tasting, low-fat, low-cholesterol substitutes for most of the things you like to eat. Choose low-fat or nonfat alternatives. Choose round or loin cuts of red meat. These types of cuts are lowest in fat and cholesterol. Chicken (without the skin), fish, veal, and ground turkey breast are great choices. Eliminate fatty meats, such as hot dogs and salami. Even shellfish have little or no saturated fat. Have a 3 oz (85 g) portion when you eat lean meat, poultry, or fish. Trans fats are also called "partially hydrogenated oils." They are oils that have been scientifically manipulated so that they are solid at room temperature resulting in a longer shelf life and improved taste and texture of foods in which they are added. Trans fats are found in stick margarine, some tub margarines, cookies, crackers, and baked goods.  When baking and cooking, oils are a great substitute for butter. The monounsaturated oils are  especially beneficial since it is believed they lower LDL and raise HDL. The oils you should avoid entirely are saturated tropical oils, such as coconut and palm.  Remember to eat a lot from food groups that are naturally free of saturated and trans fat, including fish, fruit, vegetables, beans, grains (barley, rice, couscous, bulgur wheat), and pasta (without cream sauces).  IDENTIFYING FOODS THAT LOWER FAT AND CHOLESTEROL  Soluble fiber may lower your cholesterol. This type of fiber is found in fruits such as apples, vegetables such as broccoli, potatoes, and carrots, legumes such as beans, peas, and lentils, and grains such as barley. Foods fortified with plant sterols (phytosterol) may also lower cholesterol. You should eat at least 2 g per day of these foods for a cholesterol lowering effect.  Read package labels to identify low-saturated fats, trans fat free, and low-fat foods at the supermarket. Select cheeses that have only 2 to 3 g saturated fat per ounce. Use a heart-healthy tub margarine that is free of trans fats or partially hydrogenated oil. When buying baked goods (cookies, crackers), avoid partially hydrogenated oils. Breads and muffins should be made from whole grains (whole-wheat or whole oat flour, instead of "flour" or "enriched flour"). Buy non-creamy canned soups with reduced salt and no added fats.  FOOD PREPARATION TECHNIQUES  Never deep-fry. If you must fry, either stir-fry, which uses very little fat, or use non-stick cooking sprays. When possible, broil, bake, or roast meats, and steam vegetables. Instead of putting butter or margarine on vegetables, use lemon and herbs, applesauce, and cinnamon (for squash and sweet potatoes). Use nonfat   yogurt, salsa, and low-fat dressings for salads.  LOW-SATURATED FAT / LOW-FAT FOOD SUBSTITUTES Meats / Saturated Fat (g)  Avoid: Steak, marbled (3 oz/85 g) / 11 g  Choose: Steak, lean (3 oz/85 g) / 4 g  Avoid: Hamburger (3 oz/85 g) / 7  g  Choose: Hamburger, lean (3 oz/85 g) / 5 g  Avoid: Ham (3 oz/85 g) / 6 g  Choose: Ham, lean cut (3 oz/85 g) / 2.4 g  Avoid: Chicken, with skin, dark meat (3 oz/85 g) / 4 g  Choose: Chicken, skin removed, dark meat (3 oz/85 g) / 2 g  Avoid: Chicken, with skin, light meat (3 oz/85 g) / 2.5 g  Choose: Chicken, skin removed, light meat (3 oz/85 g) / 1 g Dairy / Saturated Fat (g)  Avoid: Whole milk (1 cup) / 5 g  Choose: Low-fat milk, 2% (1 cup) / 3 g  Choose: Low-fat milk, 1% (1 cup) / 1.5 g  Choose: Skim milk (1 cup) / 0.3 g  Avoid: Hard cheese (1 oz/28 g) / 6 g  Choose: Skim milk cheese (1 oz/28 g) / 2 to 3 g  Avoid: Cottage cheese, 4% fat (1 cup) / 6.5 g  Choose: Low-fat cottage cheese, 1% fat (1 cup) / 1.5 g  Avoid: Ice cream (1 cup) / 9 g  Choose: Sherbet (1 cup) / 2.5 g  Choose: Nonfat frozen yogurt (1 cup) / 0.3 g  Choose: Frozen fruit bar / trace  Avoid: Whipped cream (1 tbs) / 3.5 g  Choose: Nondairy whipped topping (1 tbs) / 1 g Condiments / Saturated Fat (g)  Avoid: Mayonnaise (1 tbs) / 2 g  Choose: Low-fat mayonnaise (1 tbs) / 1 g  Avoid: Butter (1 tbs) / 7 g  Choose: Extra light margarine (1 tbs) / 1 g  Avoid: Coconut oil (1 tbs) / 11.8 g  Choose: Olive oil (1 tbs) / 1.8 g  Choose: Corn oil (1 tbs) / 1.7 g  Choose: Safflower oil (1 tbs) / 1.2 g  Choose: Sunflower oil (1 tbs) / 1.4 g  Choose: Soybean oil (1 tbs) / 2.4 g  Choose: Canola oil (1 tbs) / 1 g Document Released: 04/18/2005 Document Revised: 08/13/2012 Document Reviewed: 07/17/2013 [employer] Patient Information 2015 [place], [place]. This information is not intended to replace advice given to you by your health care provider. Make sure you discuss any questions you have with your health care provider. Hyperglycemia Hyperglycemia occurs when the glucose (sugar) in your blood is too high. Hyperglycemia can happen for many reasons, but it most often happens to people who do not know  they have diabetes or are not managing their diabetes properly.  CAUSES  Whether you have diabetes or not, there are other causes of hyperglycemia. Hyperglycemia can occur when you have diabetes, but it can also occur in other situations that you might not be as aware of, such as: Diabetes  If you have diabetes and are having problems controlling your blood glucose, hyperglycemia could occur because of some of the following reasons:  Not following your meal plan.  Not taking your diabetes medications or not taking it properly.  Exercising less or doing less activity than you normally do.  Being sick. Pre-diabetes  This cannot be ignored. Before people develop Type 2 diabetes, they almost always have "pre-diabetes." This is when your blood glucose levels are higher than normal, but not yet high enough to be diagnosed as diabetes. Research has shown that some long-term  damage to the body, especially the heart and circulatory system, may already be occurring during pre-diabetes. If you take action to manage your blood glucose when you have pre-diabetes, you may delay or prevent Type 2 diabetes from developing. Stress  If you have diabetes, you may be "diet" controlled or on oral medications or insulin to control your diabetes. However, you may find that your blood glucose is higher than usual in the hospital whether you have diabetes or not. This is often referred to as "stress hyperglycemia." Stress can elevate your blood glucose. This happens because of hormones put out by the body during times of stress. If stress has been the cause of your high blood glucose, it can be followed regularly by your caregiver. That way he/she can make sure your hyperglycemia does not continue to get worse or progress to diabetes. Steroids  Steroids are medications that act on the infection fighting system (immune system) to block inflammation or infection. One side effect can be a rise in blood glucose. Most  people can produce enough extra insulin to allow for this rise, but for those who cannot, steroids make blood glucose levels go even higher. It is not unusual for steroid treatments to "uncover" diabetes that is developing. It is not always possible to determine if the hyperglycemia will go away after the steroids are stopped. A special blood test called an A1c is sometimes done to determine if your blood glucose was elevated before the steroids were started. SYMPTOMS  Thirsty.  Frequent urination.  Dry mouth.  Blurred vision.  Tired or fatigue.  Weakness.  Sleepy.  Tingling in feet or leg. DIAGNOSIS  Diagnosis is made by monitoring blood glucose in one or all of the following ways:  A1c test. This is a chemical found in your blood.  Fingerstick blood glucose monitoring.  Laboratory results. TREATMENT  First, knowing the cause of the hyperglycemia is important before the hyperglycemia can be treated. Treatment may include, but is not be limited to:  Education.  Change or adjustment in medications.  Change or adjustment in meal plan.  Treatment for an illness, infection, etc.  More frequent blood glucose monitoring.  Change in exercise plan.  Decreasing or stopping steroids.  Lifestyle changes. HOME CARE INSTRUCTIONS   Test your blood glucose as directed.  Exercise regularly. Your caregiver will give you instructions about exercise. Pre-diabetes or diabetes which comes on with stress is helped by exercising.  Eat wholesome, balanced meals. Eat often and at regular, fixed times. Your caregiver or nutritionist will give you a meal plan to guide your sugar intake.  Being at an ideal weight is important. If needed, losing as little as 10 to 15 pounds may help improve blood glucose levels. SEEK MEDICAL CARE IF:   You have questions about medicine, activity, or diet.  You continue to have symptoms (problems such as increased thirst, urination, or weight gain). SEEK  IMMEDIATE MEDICAL CARE IF:   You are vomiting or have diarrhea.  Your breath smells fruity.  You are breathing faster or slower.  You are very sleepy or incoherent.  You have numbness, tingling, or pain in your feet or hands.  You have chest pain.  Your symptoms get worse even though you have been following your caregiver's orders.  If you have any other questions or concerns. Document Released: 10/12/2000 Document Revised: 07/11/2011 Document Reviewed: 08/15/2011 [facility] Patient Information 2015 [place], [place]. This information is not intended to replace advice given to you by your health  care provider. Make sure you discuss any questions you have with your health care provider.  Let me know if you change your mind regarding screening colonoscopy.

## 2014-09-22 NOTE — Progress Notes (Signed)
   Subjective:    Patient ID: [name], male    DOB: [birth year], 49 y.o.   MRN: [id number]  HPI Here for complete physical. 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 He has history of psoriatic arthritis and receives Enbrel injections. He gets yearly PPD through rheumatologist states this was normal four months ago. He has not had previous screening colonoscopy but might have had sigmoidoscopy. He was uncertain of details. No consistent exercise. Last tetanus unknown. Nonsmoker. Takes Enbrel but no other regular prescription medications.  Past Medical History  Diagnosis Date  . Psoriatic arthritis   . History of avascular necrosis of capital femoral epiphysis 2006, 2007    Bilateral Femoral Head  . HLD (hyperlipidemia)    Past Surgical History  Procedure Laterality Date  . Joint replacement Bilateral 2006, 2007    Necrosis femoral head (bilateral)    reports that he has never smoked. He has never used smokeless tobacco. He reports that he drinks alcohol. He reports that he does not use illicit drugs. family history is not on file. Allergies  Allergen Reactions  . Gold-Containing Drug Products Rash    Denies oral or airway involvement - occurred in the 1980s.       Review of Systems  Constitutional: Negative for fever, activity change, appetite change and fatigue.  HENT: Negative for congestion, ear pain and trouble swallowing.   Eyes: Negative for pain and visual disturbance.  Respiratory: Negative for cough, shortness of breath and wheezing.   Cardiovascular: Negative for chest pain and palpitations.  Gastrointestinal: Negative for nausea, vomiting, abdominal pain, diarrhea, constipation, blood in stool, abdominal distention and rectal pain.  Genitourinary: Negative for dysuria, hematuria and testicular pain.  Musculoskeletal: Negative for joint swelling and arthralgias.  Skin: Negative for rash.  Neurological: Negative for dizziness, syncope and headaches.    Hematological: Negative for adenopathy.  Psychiatric/Behavioral: Negative for confusion and dysphoric mood.       Objective:   Physical Exam  Constitutional: He is oriented to person, place, and time. He appears well-developed and well-nourished. No distress.  HENT:  Head: Normocephalic and atraumatic.  Right Ear: External ear normal.  Left Ear: External ear normal.  Mouth/Throat: Oropharynx is clear and moist.  Eyes: Conjunctivae and EOM are normal. Pupils are equal, round, and reactive to light.  Neck: Normal range of motion. Neck supple. No thyromegaly present.  Cardiovascular: Normal rate, regular rhythm and normal heart sounds.   No murmur heard. Pulmonary/Chest: No respiratory distress. He has no wheezes. He has no rales.  Abdominal: Soft. Bowel sounds are normal. He exhibits no distension and no mass. There is no tenderness. There is no rebound and no guarding.  Musculoskeletal: He exhibits no edema.  Lymphadenopathy:    He has no cervical adenopathy.  Neurological: He is alert and oriented to person, place, and time. He displays normal reflexes. No cranial nerve deficit.  Skin: No rash noted.  Psychiatric: He has a normal mood and affect.          Assessment & Plan:  Physical exam. Tetanus booster given. Recommend screening colonoscopy. He is uncertain at this point. Touch base when he is ready to schedule. Hemoccult cards given. Labs reviewed. He has blood sugar prediabetes range and hyperlipidemia. We discussed dietary and lifestyle control.

## 2014-09-22 NOTE — Progress Notes (Signed)
Pre visit review using our clinic review tool, if applicable. No additional management support is needed unless otherwise documented below in the visit note. 

## 2015-04-01 ENCOUNTER — Encounter: Payer: Self-pay | Admitting: Family Medicine

## 2015-04-01 ENCOUNTER — Telehealth: Payer: Self-pay | Admitting: Family Medicine

## 2015-04-01 ENCOUNTER — Ambulatory Visit (INDEPENDENT_AMBULATORY_CARE_PROVIDER_SITE_OTHER): Payer: 59 | Admitting: Family Medicine

## 2015-04-01 VITALS — BP 140/82 | HR 67 | Temp 98.8°F | Ht 69.0 in | Wt 250.0 lb

## 2015-04-01 DIAGNOSIS — R42 Dizziness and giddiness: Secondary | ICD-10-CM

## 2015-04-01 NOTE — Telephone Encounter (Signed)
Patient Name: [name]  DOB: 02-29-1966    Initial Comment Caller states he husband is getting dizzy, sweating. Head rush.   Nurse Assessment  Nurse: Raphael Gibney, RN, Vanita Ingles Date/Time Eilene Ghazi Time): 04/01/2015 10:23:57 AM  Confirm and document reason for call. If symptomatic, describe symptoms. ---Caller states spouse gets dizzy. He gets lightheaded. Has a little cough. History of pneumonia. No fever. Slight headache.  Has the patient traveled out of the country within the last 30 days? ---No  Does the patient have any new or worsening symptoms? ---Yes  Will a triage be completed? ---Yes  Related visit to physician within the last 2 weeks? ---No  Does the PT have any chronic conditions? (i.e. diabetes, asthma, etc.) ---Yes  List chronic conditions. ---psoriatic arthritis  Is this a behavioral health call? ---No     Guidelines    Guideline Title Affirmed Question Affirmed Notes  Dizziness - Lightheadedness [1] MODERATE dizziness (e.g., interferes with normal activities) AND [2] has NOT been evaluated by physician for this (Exception: dizziness caused by heat exposure, sudden standing, or poor fluid intake)    Final Disposition User   See Physician within 24 Hours [place], RN, Vera    Comments  Appt scheduled for 4 pm 04/01/2015 with Dr. Alysia Penna   Referrals  REFERRED TO PCP OFFICE   Disagree/Comply: Comply

## 2015-04-01 NOTE — Progress Notes (Signed)
Pre visit review using our clinic review tool, if applicable. No additional management support is needed unless otherwise documented below in the visit note. 

## 2015-04-01 NOTE — Progress Notes (Signed)
   Subjective:    Patient ID: [name], male    DOB: 09/07/1965, 49 y.o.   MRN: [id number]  HPI Here for 2 days of intermittent lightheadedness which is more pronounced when he takes a deep breath. No vertigo type sx, no HA, no fever, no nausea. He does have sinus pressure, PND, and nasal congestion. No ST or cough. No chest pain or SOB.    Review of Systems  Constitutional: Negative.   HENT: Positive for congestion, postnasal drip and sinus pressure. Negative for ear pain, facial swelling, sore throat and tinnitus.   Eyes: Negative.   Respiratory: Negative.   Cardiovascular: Negative.   Neurological: Positive for light-headedness. Negative for dizziness, tremors, seizures, syncope, facial asymmetry, speech difficulty, weakness, numbness and headaches.       Objective:   Physical Exam  Constitutional: He is oriented to person, place, and time. He appears well-developed and well-nourished. No distress.  HENT:  Right Ear: External ear normal.  Left Ear: External ear normal.  Nose: Nose normal.  Mouth/Throat: Oropharynx is clear and moist.  Eyes: Conjunctivae and EOM are normal. Pupils are equal, round, and reactive to light.  Neck: Neck supple. No thyromegaly present.  Cardiovascular: Normal rate, regular rhythm, normal heart sounds and intact distal pulses.   Pulmonary/Chest: Effort normal and breath sounds normal.  Lymphadenopathy:    He has no cervical adenopathy.  Neurological: He is alert and oriented to person, place, and time. He has normal reflexes. No cranial nerve deficit. He exhibits normal muscle tone. Coordination normal.          Assessment & Plan:  Lightheadedness, probably related to sinus congestion from allergies. Try Mucinex and Flonase prn. Drink fluids.

## 2015-04-01 NOTE — Telephone Encounter (Signed)
Pt has an appt with Dr Sarajane Jews today at 4:00

## 2015-04-02 LAB — BASIC METABOLIC PANEL
BUN: 21 mg/dL (ref 6–23)
CALCIUM: 9.5 mg/dL (ref 8.4–10.5)
CO2: 29 meq/L (ref 19–32)
Chloride: 104 mEq/L (ref 96–112)
Creatinine, Ser: 1.12 mg/dL (ref 0.40–1.50)
GFR: 73.3 mL/min (ref >60.00)
GLUCOSE: 85 mg/dL (ref 70–99)
Potassium: 4.3 mEq/L (ref 3.5–5.1)
Sodium: 141 mEq/L (ref 135–145)

## 2015-04-02 LAB — TSH: TSH: 1.42 u[IU]/mL (ref 0.35–4.50)

## 2015-05-06 MED FILL — ENBREL 50 MG/ML SYRINGE: 50 | 28 days supply | Qty: 4 | Fill #0

## 2015-06-10 DIAGNOSIS — T5691XD Toxic effect of unspecified metal, accidental (unintentional), subsequent encounter: Secondary | ICD-10-CM | POA: Diagnosis not present

## 2015-06-10 DIAGNOSIS — Z79899 Other long term (current) drug therapy: Secondary | ICD-10-CM | POA: Diagnosis not present

## 2015-06-22 DIAGNOSIS — L408 Other psoriasis: Secondary | ICD-10-CM | POA: Diagnosis not present

## 2015-06-22 DIAGNOSIS — Z09 Encounter for follow-up examination after completed treatment for conditions other than malignant neoplasm: Secondary | ICD-10-CM | POA: Diagnosis not present

## 2015-06-22 MED FILL — ENBREL 50 MG/ML SYRINGE: 50 | 28 days supply | Qty: 4 | Fill #1

## 2015-08-18 MED FILL — ENBREL 50 MG/ML SYRINGE: 50 | 28 days supply | Qty: 4 | Fill #2

## 2015-10-01 MED FILL — AMOXICILLIN 500 MG CAPSULE: 500 | 3 days supply | Qty: 12 | Fill #0

## 2015-10-12 MED FILL — ENBREL 50 MG/ML SYRINGE: 50 | 28 days supply | Qty: 4 | Fill #0

## 2015-10-26 MED FILL — CLINDAMYCIN HCL 150 MG CAP: 150 | 10 days supply | Qty: 30 | Fill #0

## 2015-10-26 MED FILL — HYDROCODON-APAP 7.5-325: 7.5-325 | 2 days supply | Qty: 8 | Fill #0

## 2015-11-04 ENCOUNTER — Telehealth: Payer: Self-pay | Admitting: Pharmacist

## 2015-11-04 NOTE — Telephone Encounter (Signed)
Returning patient call to schedule appointment for [facility]. Unable to reach patient or leave a message. Will attempt to reach out again 11/05/15.

## 2015-11-05 NOTE — Telephone Encounter (Signed)
Patient returned call, appointment scheduled.

## 2015-11-12 ENCOUNTER — Ambulatory Visit (HOSPITAL_BASED_OUTPATIENT_CLINIC_OR_DEPARTMENT_OTHER): Payer: 59 | Admitting: Pharmacist

## 2015-11-12 DIAGNOSIS — L4050 Arthropathic psoriasis, unspecified: Secondary | ICD-10-CM

## 2015-11-12 NOTE — Progress Notes (Signed)
   S: Patient presents today to the [facility].  Patient is currently taking Enbrel for psoriatic arthritis. Patient is managed by Dr. Estanislado Pandy for this.   Adherence: denies any missed doses.  Humira is a TNF blocking agent indicated for ankylosing spondylitis, Crohn's disease, Hidradenitis suppurativa, psoriatic arthritis, plaque psoriasis, ulcerative colitis, and uveitis. The most common adverse effects are infections, headache, and injection site reactions. There is the possibility of an increased risk of malignancy but it is not well understood if this increased risk is due to there medication or the disease state. There are rare cases of pancytopenia and aplastic anemia.  Dosing: Enbrel 50 mg weekly  Drug-drug interactions: none  Screening: TB test: completed per patient Hepatitis: completed per patient  Monitoring: S/sx of infection: denies CBC: done q3 months, all have been normal S/sx of hypersensitivity: denies S/sx of malignancy: denies S/sx of heart failure: denies    O:     Lab Results  Component Value Date   WBC 4.9 09/15/2014   HGB 16.3 09/15/2014   HCT 47.9 09/15/2014   MCV 91.1 09/15/2014   PLT 136.0* 09/15/2014      Chemistry      Component Value Date/Time   NA 141 04/01/2015 1705   K 4.3 04/01/2015 1705   CL 104 04/01/2015 1705   CO2 29 04/01/2015 1705   BUN 21 04/01/2015 1705   CREATININE 1.12 04/01/2015 1705      Component Value Date/Time   CALCIUM 9.5 04/01/2015 1705   ALKPHOS 91 09/15/2014 0735   AST 22 09/15/2014 0735   ALT 33 09/15/2014 0735   BILITOT 0.6 09/15/2014 0735       A/P: 1. Medication review: patient on Enbrel for psoriatic arthritis and is tolerating it well with no adverse effects and with improved control of his psoriatic arthritis. Reviewed the medication with him, including the increase risk of infection, the need for close monitoring of lab work, the possible  increased risk of malignancy, and the risk of injection site reactions. No recommendations for changes.    Nicoletta Ba, PharmD, BCPS, [facility] [phone]

## 2015-11-25 DIAGNOSIS — Z79899 Other long term (current) drug therapy: Secondary | ICD-10-CM | POA: Diagnosis not present

## 2015-11-25 DIAGNOSIS — Z9225 Personal history of immunosupression therapy: Secondary | ICD-10-CM | POA: Diagnosis not present

## 2015-12-02 ENCOUNTER — Other Ambulatory Visit: Payer: Self-pay | Admitting: Pharmacist

## 2015-12-02 DIAGNOSIS — L4050 Arthropathic psoriasis, unspecified: Secondary | ICD-10-CM

## 2015-12-02 MED ORDER — ETANERCEPT 50 MG/ML ~~LOC~~ SOSY: 50.0000 mg | PREFILLED_SYRINGE | SUBCUTANEOUS | 0 refills | Status: DC

## 2015-12-02 MED FILL — ENBREL 50 MG/ML SYRINGE: 50 | 28 days supply | Qty: 4 | Fill #0

## 2015-12-25 DIAGNOSIS — M79672 Pain in left foot: Secondary | ICD-10-CM | POA: Diagnosis not present

## 2015-12-25 DIAGNOSIS — M79671 Pain in right foot: Secondary | ICD-10-CM | POA: Diagnosis not present

## 2015-12-25 DIAGNOSIS — M79642 Pain in left hand: Secondary | ICD-10-CM | POA: Diagnosis not present

## 2015-12-25 DIAGNOSIS — L408 Other psoriasis: Secondary | ICD-10-CM | POA: Diagnosis not present

## 2015-12-25 DIAGNOSIS — Z09 Encounter for follow-up examination after completed treatment for conditions other than malignant neoplasm: Secondary | ICD-10-CM | POA: Diagnosis not present

## 2015-12-25 DIAGNOSIS — M79641 Pain in right hand: Secondary | ICD-10-CM | POA: Diagnosis not present

## 2015-12-28 ENCOUNTER — Other Ambulatory Visit: Payer: Self-pay | Admitting: Pharmacist

## 2015-12-28 MED ORDER — ETANERCEPT 50 MG/ML ~~LOC~~ SOSY: 50.0000 mg | PREFILLED_SYRINGE | SUBCUTANEOUS | 0 refills | Status: DC

## 2016-02-08 MED FILL — ENBREL 50 MG/ML SYRINGE: 50 | 28 days supply | Qty: 4 | Fill #0

## 2016-03-21 ENCOUNTER — Other Ambulatory Visit: Payer: Self-pay | Admitting: Rheumatology

## 2016-03-21 DIAGNOSIS — Z79899 Other long term (current) drug therapy: Secondary | ICD-10-CM

## 2016-03-21 MED FILL — AMOXICILLIN 500 MG CAPSULE: 500 | 3 days supply | Qty: 12 | Fill #1

## 2016-03-21 NOTE — Telephone Encounter (Signed)
8/25/17last visit Labs WNL 11/24/15 TB neg7/25/17 Patients labs are due/ lab orders placed  Do not have valid phone numbers in either computer system

## 2016-03-23 ENCOUNTER — Other Ambulatory Visit: Payer: Self-pay | Admitting: Internal Medicine

## 2016-03-23 ENCOUNTER — Other Ambulatory Visit: Payer: Self-pay | Admitting: Rheumatology

## 2016-03-28 ENCOUNTER — Telehealth: Payer: Self-pay | Admitting: Radiology

## 2016-03-28 NOTE — Telephone Encounter (Signed)
Call patient / CBC and hepatic profile are due for refill of Enbrel, TB gold neg 11/30/15

## 2016-03-28 NOTE — Telephone Encounter (Signed)
Patient would like a sample of Enbrel since he cannot get the refill until labs are done. Patient states he is in pain

## 2016-03-28 NOTE — Telephone Encounter (Signed)
I called patient to discuss, his number is busy.. If he comes by for labs I can ask about providing a sample

## 2016-03-29 ENCOUNTER — Other Ambulatory Visit: Payer: Self-pay | Admitting: Rheumatology

## 2016-03-29 DIAGNOSIS — Z79899 Other long term (current) drug therapy: Secondary | ICD-10-CM | POA: Diagnosis not present

## 2016-03-29 LAB — CBC WITH DIFFERENTIAL/PLATELET
BASOS ABS: 0 {cells}/uL (ref 0–200)
Basophils Relative: 0 %
EOS PCT: 1 %
Eosinophils Absolute: 68 cells/uL (ref 15–500)
HEMATOCRIT: 46.7 % (ref 38.5–50.0)
HEMOGLOBIN: 15.9 g/dL (ref 13.2–17.1)
LYMPHS ABS: 1904 {cells}/uL (ref 850–3900)
Lymphocytes Relative: 28 %
MCH: 31.2 pg (ref 27.0–33.0)
MCHC: 34 g/dL (ref 32.0–36.0)
MCV: 91.7 fL (ref 80.0–100.0)
MONO ABS: 272 {cells}/uL (ref 200–950)
MPV: 11.1 fL (ref 7.5–12.5)
Monocytes Relative: 4 %
NEUTROS ABS: 4556 {cells}/uL (ref 1500–7800)
Neutrophils Relative %: 67 %
Platelets: 149 10*3/uL (ref 140–400)
RBC: 5.09 MIL/uL (ref 4.20–5.80)
RDW: 13.6 % (ref 11.0–15.0)
WBC: 6.8 10*3/uL (ref 3.8–10.8)

## 2016-03-29 LAB — COMPLETE METABOLIC PANEL WITH GFR
ALBUMIN: 4.1 g/dL (ref 3.6–5.1)
ALK PHOS: 68 U/L (ref 40–115)
ALT: 31 U/L (ref 9–46)
AST: 24 U/L (ref 10–35)
BUN: 23 mg/dL (ref 7–25)
CHLORIDE: 105 mmol/L (ref 98–110)
CO2: 26 mmol/L (ref 20–31)
Calcium: 9.4 mg/dL (ref 8.6–10.3)
Creat: 1.12 mg/dL (ref 0.70–1.33)
GFR, EST NON AFRICAN AMERICAN: 75 mL/min (ref >=60)
GFR, Est African American: 87 mL/min (ref >=60)
GLUCOSE: 82 mg/dL (ref 65–99)
POTASSIUM: 4.5 mmol/L (ref 3.5–5.3)
SODIUM: 139 mmol/L (ref 135–146)
Total Bilirubin: 0.7 mg/dL (ref 0.2–1.2)
Total Protein: 7 g/dL (ref 6.1–8.1)

## 2016-03-30 ENCOUNTER — Telehealth: Payer: Self-pay | Admitting: Radiology

## 2016-03-30 ENCOUNTER — Other Ambulatory Visit: Payer: Self-pay | Admitting: Pharmacist

## 2016-03-30 MED ORDER — ETANERCEPT 50 MG/ML ~~LOC~~ SOSY
50.0000 mg | PREFILLED_SYRINGE | SUBCUTANEOUS | 2 refills | Status: DC
Start: 2016-03-30 — End: 2016-07-18

## 2016-03-30 MED ORDER — ETANERCEPT 50 MG/ML ~~LOC~~ SOSY: 50.0000 mg | PREFILLED_SYRINGE | SUBCUTANEOUS | 2 refills | Status: DC

## 2016-03-30 MED FILL — ENBREL 50 MG/ML SYRINGE: 50 | 28 days supply | Qty: 4 | Fill #0

## 2016-03-30 NOTE — Telephone Encounter (Signed)
12/25/15 last visit  05/17/16 next visit  Ok to refill per Dr Estanislado Pandy

## 2016-03-30 NOTE — Addendum Note (Signed)
Addended byCandice Camp on: 03/30/2016 01:35 PM   Modules accepted: Orders

## 2016-03-30 NOTE — Telephone Encounter (Signed)
I have called patient to advise labs are normal his Enbrel refill was sent in for him, line busy x2

## 2016-04-05 MED FILL — AMOXICILLIN 875 MG TABLET: 875 | 10 days supply | Qty: 20 | Fill #0

## 2016-04-13 MED FILL — CLINDAMYCIN HCL 150 MG CAPS: 150 | 3 days supply | Qty: 12 | Fill #0

## 2016-04-18 MED FILL — HYDROCODON-APAP 7.5-325: 7.5-325 | 2 days supply | Qty: 8 | Fill #0

## 2016-04-18 MED FILL — AMOXICILLIN 875 MG TABLET: 875 | 7 days supply | Qty: 14 | Fill #0

## 2016-04-20 MED FILL — ENBREL 50 MG/ML SYRINGE: 50 | 28 days supply | Qty: 4 | Fill #1

## 2016-05-17 ENCOUNTER — Ambulatory Visit (INDEPENDENT_AMBULATORY_CARE_PROVIDER_SITE_OTHER): Payer: 59 | Admitting: Rheumatology

## 2016-05-17 ENCOUNTER — Encounter: Payer: Self-pay | Admitting: Rheumatology

## 2016-05-17 VITALS — BP 134/70 | HR 70 | Resp 16 | Ht 70.0 in | Wt 246.0 lb

## 2016-05-17 DIAGNOSIS — L408 Other psoriasis: Secondary | ICD-10-CM

## 2016-05-17 DIAGNOSIS — M79642 Pain in left hand: Secondary | ICD-10-CM

## 2016-05-17 DIAGNOSIS — Z79899 Other long term (current) drug therapy: Secondary | ICD-10-CM | POA: Diagnosis not present

## 2016-05-17 DIAGNOSIS — M79641 Pain in right hand: Secondary | ICD-10-CM

## 2016-05-17 DIAGNOSIS — L4050 Arthropathic psoriasis, unspecified: Secondary | ICD-10-CM

## 2016-05-17 LAB — COMPLETE METABOLIC PANEL WITH GFR
ALBUMIN: 4.4 g/dL (ref 3.6–5.1)
ALK PHOS: 73 U/L (ref 40–115)
ALT: 46 U/L (ref 9–46)
AST: 29 U/L (ref 10–35)
BUN: 20 mg/dL (ref 7–25)
CALCIUM: 9.3 mg/dL (ref 8.6–10.3)
CO2: 25 mmol/L (ref 20–31)
Chloride: 104 mmol/L (ref 98–110)
Creat: 1.01 mg/dL (ref 0.70–1.33)
GFR, EST NON AFRICAN AMERICAN: 85 mL/min (ref >=60)
Glucose, Bld: 86 mg/dL (ref 65–99)
POTASSIUM: 4.7 mmol/L (ref 3.5–5.3)
Sodium: 139 mmol/L (ref 135–146)
Total Bilirubin: 0.8 mg/dL (ref 0.2–1.2)
Total Protein: 7.3 g/dL (ref 6.1–8.1)

## 2016-05-17 LAB — CBC WITH DIFFERENTIAL/PLATELET
BASOS ABS: 0 {cells}/uL (ref 0–200)
Basophils Relative: 0 %
Eosinophils Absolute: 122 cells/uL (ref 15–500)
Eosinophils Relative: 2 %
HEMATOCRIT: 49.1 % (ref 38.5–50.0)
HEMOGLOBIN: 16.4 g/dL (ref 13.2–17.1)
LYMPHS ABS: 1952 {cells}/uL (ref 850–3900)
Lymphocytes Relative: 32 %
MCH: 31.2 pg (ref 27.0–33.0)
MCHC: 33.4 g/dL (ref 32.0–36.0)
MCV: 93.5 fL (ref 80.0–100.0)
MONO ABS: 305 {cells}/uL (ref 200–950)
MPV: 10.7 fL (ref 7.5–12.5)
Monocytes Relative: 5 %
NEUTROS PCT: 61 %
Neutro Abs: 3721 cells/uL (ref 1500–7800)
Platelets: 141 10*3/uL (ref 140–400)
RBC: 5.25 MIL/uL (ref 4.20–5.80)
RDW: 13.8 % (ref 11.0–15.0)
WBC: 6.1 10*3/uL (ref 3.8–10.8)

## 2016-05-17 NOTE — Addendum Note (Signed)
Addended byEliezer Lofts on: 05/17/2016 09:15 AM   Modules accepted: Orders

## 2016-05-17 NOTE — Progress Notes (Signed)
Office Visit Note  Patient: [name]             Date of Birth: [birth year]           MRN: [id number]             PCP: Eulas Post, MD Referring: Eulas Post, MD Visit Date: 05/17/2016 Occupation: _0 @    Subjective:  Follow-up (does not feel well today feels stiffness ) and Fatigue Follow-up on psoriatic arthritis and psoriasis and high risk prescription  History of Present Illness: [name] is a 51 y.o. male  Last seen 12/25/2015 Doing well with Enbrel according to the patient. He is taking it every 10 days. He does state that he has some swelling and stiffness in his hands at times where he feels like he cannot make a full fist like he normally does. He already has decreased first formation in the past.  History of DDD of the C and L-spine with decreased range of motion on ongoing stiffness.  Psoriasis is doing well. No flare.  We discussed the importance of taking Enbrel on the schedule that works for his joints. We don't want any subclinical swelling and erosions to be happening and we discussed the importance of getting a repeat ultrasound to verify this. In the meanwhile, if every 10 day schedule is appropriate for the patient would like him to continue but perhaps every 7 days might be appropriate. As a result we've encouraged the patient to do every 7 days of Enbrel. If he sees a noticeable difference between every 7 days and every 10 days, he is to notify me. In the meanwhile we will schedule an ultrasound which will allow [place] a more insight into how his joints are actually doing to his current every 10 day schedule.  He is due for labs in a couple of weeks but it is more convenient to get them done today.  He has some fatigue and I'll draw labs again today to see how he's doing on his labs if he can explain some of his fatigue. If not he will need to speak with his PCP regarding this.   Activities of Daily Living:  Patient reports morning  stiffness for 10 minutes.   Patient Denies nocturnal pain.  Difficulty dressing/grooming: Denies Difficulty climbing stairs: Denies Difficulty getting out of chair: Denies Difficulty using hands for taps, buttons, cutlery, and/or writing: Reports   Review of Systems  Constitutional: Negative for fatigue.  HENT: Negative for mouth sores and mouth dryness.   Eyes: Negative for dryness.  Respiratory: Negative for shortness of breath.   Gastrointestinal: Negative for constipation and diarrhea.  Musculoskeletal: Negative for myalgias and myalgias.  Skin: Negative for sensitivity to sunlight.  Neurological: Negative for memory loss.  Psychiatric/Behavioral: Negative for sleep disturbance.    PMFS History:  Patient Active Problem List   Diagnosis Date Noted  . Prediabetes 09/22/2014  . Hyperlipidemia 09/22/2014  . URI (upper respiratory infection) 07/07/2012  . Acute bronchitis 07/07/2012  . HYPERLIPIDEMIA 07/03/2008  . OBESITY 07/03/2008  . AVASCULAR NECROSIS, FEMORAL HEAD 07/03/2008  . Psoriatic arthritis ([place]) 05/02/2001    Past Medical History:  Diagnosis Date  . History of avascular necrosis of capital femoral epiphysis 2006, 2007   Bilateral Femoral Head  . HLD (hyperlipidemia)   . Psoriatic arthritis ([place])     No family history on file. Past Surgical History:  Procedure Laterality Date  . JOINT REPLACEMENT Bilateral 2006, 2007   Necrosis  femoral head (bilateral)   Social History   Social History Narrative  . No narrative on file     Objective: Vital Signs: BP 134/70   Pulse 70   Resp 16   Ht 5' 10" (1.778 m)   Wt 246 lb (111.6 kg)   BMI 35.30 kg/m    Physical Exam  Constitutional: He is oriented to person, place, and time. He appears well-developed and well-nourished.  HENT:  Head: Normocephalic and atraumatic.  Eyes: Conjunctivae and EOM are normal. Pupils are equal, round, and reactive to light.  Neck: Normal range of motion. Neck supple.    Cardiovascular: Normal rate, regular rhythm and normal heart sounds.  Exam reveals no gallop and no friction rub.   No murmur heard. Pulmonary/Chest: Effort normal and breath sounds normal. No respiratory distress. He has no wheezes. He has no rales. He exhibits no tenderness.  Abdominal: Soft. He exhibits no distension and no mass. There is no tenderness. There is no guarding.  Musculoskeletal: Normal range of motion.  Lymphadenopathy:    He has no cervical adenopathy.  Neurological: He is alert and oriented to person, place, and time. He exhibits normal muscle tone. Coordination normal.  Skin: Skin is warm and dry. Capillary refill takes less than 2 seconds. No rash noted.  Psychiatric: He has a normal mood and affect. His behavior is normal. Judgment and thought content normal.  Nursing note and vitals reviewed.    Musculoskeletal Exam:  Full range of motion of all joints except neck and back About 80% fist formation bilaterally Fiber myalgia tender points are all absent  CDAI Exam: CDAI Homunculus Exam:   Joint Counts:  CDAI Tender Joint count: 0 CDAI Swollen Joint count: 0   No synovitis on examination but patient does have decreased fist formation and occasional swelling/stiffness of his hands. I'm uncertain whether this is from old damage or this is from new temporary inflammation from taking Enbrel every 10 days.  Investigation: No additional findings.  Orders Only on 03/29/2016  Component Date Value Ref Range Status  . Sodium 03/29/2016 139  135 - 146 mmol/L Final  . Potassium 03/29/2016 4.5  3.5 - 5.3 mmol/L Final  . Chloride 03/29/2016 105  98 - 110 mmol/L Final  . CO2 03/29/2016 26  20 - 31 mmol/L Final  . Glucose, Bld 03/29/2016 82  65 - 99 mg/dL Final  . BUN 03/29/2016 23  7 - 25 mg/dL Final  . Creat 03/29/2016 1.12  0.70 - 1.33 mg/dL Final   Comment:   For patients > or = 53 years of age: The upper reference limit for Creatinine is approximately 13% higher  for people identified as African-American.     . Total Bilirubin 03/29/2016 0.7  0.2 - 1.2 mg/dL Final  . Alkaline Phosphatase 03/29/2016 68  40 - 115 U/L Final  . AST 03/29/2016 24  10 - 35 U/L Final  . ALT 03/29/2016 31  9 - 46 U/L Final  . Total Protein 03/29/2016 7.0  6.1 - 8.1 g/dL Final  . Albumin 03/29/2016 4.1  3.6 - 5.1 g/dL Final  . Calcium 03/29/2016 9.4  8.6 - 10.3 mg/dL Final  . GFR, Est African American 03/29/2016 87  >=60 mL/min Final  . GFR, Est Non African American 03/29/2016 75  >=60 mL/min Final  . WBC 03/29/2016 6.8  3.8 - 10.8 K/uL Final  . RBC 03/29/2016 5.09  4.20 - 5.80 MIL/uL Final  . Hemoglobin 03/29/2016 15.9  13.2 - 17.1  g/dL Final  . HCT 03/29/2016 46.7  38.5 - 50.0 % Final  . MCV 03/29/2016 91.7  80.0 - 100.0 fL Final  . MCH 03/29/2016 31.2  27.0 - 33.0 pg Final  . MCHC 03/29/2016 34.0  32.0 - 36.0 g/dL Final  . RDW 03/29/2016 13.6  11.0 - 15.0 % Final  . Platelets 03/29/2016 149  140 - 400 K/uL Final  . MPV 03/29/2016 11.1  7.5 - 12.5 fL Final  . Neutro Abs 03/29/2016 4556  1,500 - 7,800 cells/uL Final  . Lymphs Abs 03/29/2016 1904  850 - 3,900 cells/uL Final  . Monocytes Absolute 03/29/2016 272  200 - 950 cells/uL Final  . Eosinophils Absolute 03/29/2016 68  15 - 500 cells/uL Final  . Basophils Absolute 03/29/2016 0  0 - 200 cells/uL Final  . Neutrophils Relative % 03/29/2016 67  % Final  . Lymphocytes Relative 03/29/2016 28  % Final  . Monocytes Relative 03/29/2016 4  % Final  . Eosinophils Relative 03/29/2016 1  % Final  . Basophils Relative 03/29/2016 0  % Final  . Smear Review 03/29/2016 Criteria for review not met   Final     Imaging: No results found.  Speciality Comments: No specialty comments available.    Procedures:  No procedures performed Allergies: Gold-containing drug products   Assessment / Plan:     Visit Diagnoses: Psoriatic arthropathy ([place])  Other psoriasis  High risk medications (not anticoagulants) long-term use  - Plan: CBC with Differential/Platelet, COMPLETE METABOLIC PANEL WITH GFR  Bilateral hand pain   Plan: #1: Psoriatic arthritis. No synovitis on examination but patient does have minor stiffness/swelling in his joints of the hands. Possibility of old damage versus new inflammation due to taking Enbrel every 10 days.  #2: Psoriasis. No active lesions  #3: High-risk prescription.; Taking Enbrel every 10 days. This may be adequate for the patient but I'm concerned that the minor swelling that he is having in his hands from time to time may be coming from taking Enbrel every 10 days instead of every 7 days. The patient and I discuss switching his schedule to every 7 days. He is going to take a mental note of how he's feeling now versus how he feels a month from now after taking for Enbrel injections one week apart and see how he feels. We will also schedule ultrasound for the patient to be sure there is no erosions going on. Reviewing his SRS notes shows that the last ultrasound was done [facility] 12/19/2012.  #4: DDD of the C-spine. Ongoing pain with decreased range of motion  #5: DDD of the L-spine. Decreased range of motion and ongoing pain.  #6: CBC with differential CMP with GFR today and then again 3 months from now.; He'll be due for TB Gold in July 2018.  #7: Physical therapy evaluate and treat, neck bilateral shoulders, bilateral elbows, bilateral wrists, bilateral hands, bilateral hips, bilateral knees, bilateral ankles and feet with decreased range of motion and stiffness. Evaluate and treat.  #8: Sample of Enbrel to go so that patient can take it every 7 days  #9: Schedule ultrasound of bilateral hands and wrist to rule out erosions  #10:  Room 1 CBC with differential CMP with GFR Last labs were 03/28/2016 and so we will do labs today. He'll be due for repeat labs in 3 months from today and then 3 months after that and we will add TB gold at that time which will be due in July  2018     Orders: Orders Placed This Encounter  Procedures  . CBC with Differential/Platelet  . COMPLETE METABOLIC PANEL WITH GFR   No orders of the defined types were placed in this encounter.   Face-to-face time spent with patient was 30 minutes. 50% of time was spent in counseling and coordination of care.  Follow-Up Instructions: Return in about 5 months (around 10/15/2016) for PsA, PS, EBREL Q 10 days, hand pain, .   Eliezer Lofts, PA-C I examined patient today. He did not have much synovitis but continues to have some tenderness in his joints. He is also having intermittent increased pain. He's been taking Enbrel every 10 days right now we discussed switching to once a week for right now. We will schedule ultrasound of his bilateral hands to look for synovitis.  Bo Merino, MD, FACR  Note - This record has been created using [employer].  Chart creation errors have been sought, but may not always  have been located. Such creation errors do not reflect on  the standard of medical care.

## 2016-05-17 NOTE — Progress Notes (Signed)
Rheumatology Medication Review by a Pharmacist Does the patient feel that his/her medications are working for him/her?  Yes Has the patient been experiencing any side effects to the medications prescribed?  No Does the patient have any problems obtaining medications?  No  Issues to address at subsequent visits: None   Pharmacist comments:  [name] is a pleasant 51 yo M who presents for follow up of his psoriatic arthritis, psoriasis.  He is currently taking Enbrel 50 mg every 10 days.  Patient reports his symptoms were well controlled so he started spacing his Enbrel out.  Patient had most recent standing labs on 03/29/16.  CBC and CMP were normal.  He will be due for standing labs again in February 2018.  Most recent TB Gold was in July 2017 which was negative.  He will be due for TB Gold again in July 2018.  Patient denies any questions regarding his medications at this time.     Elisabeth Most, Pharm.D., BCPS, CPP Clinical Pharmacist Pager: [phone] Phone: [phone] 05/17/2016 8:34 AM

## 2016-05-17 NOTE — Patient Instructions (Addendum)
Standing Labs We placed an order today for your standing lab work.    Please come back and get your standing labs in February 2018 and every 3 months.  You will be due for your TB Gold in July 2018.    We have open lab Monday through Friday from 8:30-11:30 AM and 1:30-4 PM at the office of Dr. Tresa Moore, PA.   The office is located at [address], [place], [place], [place] [address] No appointment is necessary.   Labs are drawn by [facility].  You may receive a bill from [facility] for your lab work.

## 2016-05-20 NOTE — Progress Notes (Signed)
Please tell patient his labs are okay. Please send copy of the labs to PCP

## 2016-06-06 ENCOUNTER — Telehealth: Payer: Self-pay

## 2016-06-06 MED FILL — ENBREL 50 MG/ML SYRINGE: 50 | 28 days supply | Qty: 4 | Fill #2

## 2016-06-06 NOTE — Telephone Encounter (Signed)
Noted patient's last refill of Enbrel was on 04/20/16 at [facility] I called patient with a refill reminder call.  Patient confirms he does need a refill at this time.  He says "[name] suggested that I take it every 7 days vs 9 days. There is no difference as of yet." Talked to him about his copay and applying for a new enbrel co-pay card. He must call [phone] to get enrolled and they should send him a new card. The refill was processed at the pharmacy. Patient voiced understanding and denies any other questions.  [name], [place], CPhT

## 2016-06-10 ENCOUNTER — Ambulatory Visit: Payer: 59 | Attending: Rheumatology | Admitting: Rehabilitation

## 2016-06-10 DIAGNOSIS — R2689 Other abnormalities of gait and mobility: Secondary | ICD-10-CM | POA: Insufficient documentation

## 2016-06-10 DIAGNOSIS — R29898 Other symptoms and signs involving the musculoskeletal system: Secondary | ICD-10-CM | POA: Insufficient documentation

## 2016-06-10 NOTE — Therapy (Signed)
[facility] [address] [place], [place], [address] Phone: [phone]   Fax:  [phone]  Physical Therapy Evaluation  Patient Details  Name: [name] MRN: [id number] Date of Birth: 04-09-1966 Referring Provider: Eliezer Lofts, PA  Encounter Date: 06/10/2016      PT End of Session - 06/10/16 0957    Visit Number 1   Number of Visits 4   Date for PT Re-Evaluation 08/09/16   Authorization Type UMR   PT Start Time 0900   PT Stop Time 0950   PT Time Calculation (min) 50 min   Activity Tolerance Patient tolerated treatment well   Behavior During Therapy [facility] for tasks assessed/performed      Past Medical History:  Diagnosis Date  . History of avascular necrosis of capital femoral epiphysis 2006, 2007   Bilateral Femoral Head  . HLD (hyperlipidemia)   . Psoriatic arthritis [facility])     Past Surgical History:  Procedure Laterality Date  . JOINT REPLACEMENT Bilateral 2006, 2007   Necrosis femoral head (bilateral)    There were no vitals filed for this visit.       Subjective Assessment - 06/10/16 0903    Subjective "I was diagnosed with this in my early 20's with RA, however was then changed to psoriatic.  I was told early on to apply for disability which was gloomy.  I pushed through and worked til 47.  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  I tried to go back and do some work, but it wasn't working.  I have good times and not so good times.  I used to walk for exercise, but lately with the cold it is hard.  I need more discipline."   Limitations House hold activities;Walking   How long can you stand comfortably? If needed, up to 15-20 mins   How long can you walk comfortably? 30-40 mins (grocery shopping trip)   Patient Stated Goals "better drive and good habits to do some exercises."    Currently in Pain? Yes   Pain Score 3    Pain Location Generalized   Pain Descriptors / Indicators Tightness   Pain Type Chronic pain   Pain Onset More than a month ago   Pain Frequency Intermittent   Aggravating Factors  sitting for long periods of time   Pain Relieving Factors getting up and moving, shower            [facility] PT Assessment - 06/10/16 0912      Assessment   Medical Diagnosis psoriatic arthritis   Referring Provider Eliezer Lofts, PA   Onset Date/Surgical Date --  has good periods, but winter brings on harder times   Hand Dominance Right   Prior Therapy following THP, had HHPT     Precautions   Precautions Fall     Restrictions   Weight Bearing Restrictions No     Balance Screen   Has the patient fallen in the past 6 months Yes   How many times? 1   Has the patient had a decrease in activity level because of a fear of falling?  No   Is the patient reluctant to leave their home because of a fear of falling?  No     Home Environment   Living Environment Private residence   Living Arrangements Spouse/significant other   Available Help at Discharge Family;Available PRN/intermittently   Type of Home House  townhome   Home Access Stairs  to enter  1 small stoop   Entrance Stairs-Number of Steps 1   Entrance Stairs-Rails None   Home Layout Two level;Bed/bath upstairs   Alternate Level Stairs-Number of Steps 16-20   Alternate Level Stairs-Rails Right   Home Equipment Walker - 2 wheels;Cane - single point  walk in shower     Prior Function   Level of Independence Independent   Vocation On disability   Leisure walking more, biking, light hiking     Cognition   Overall Cognitive Status Within Functional Limits for tasks assessed     Sensation   Light Touch Appears Intact   Hot/Cold Appears Intact   Proprioception Appears Intact     Coordination   Gross Motor Movements are Fluid and Coordinated Yes   Fine Motor Movements are Fluid and Coordinated Yes     ROM / Strength   AROM / PROM / Strength Strength     Strength   Overall Strength Within functional  limits for tasks performed   Overall Strength Comments overall 4/5 strength     Flexibility   Soft Tissue Assessment /Muscle Length yes   Hamstrings tightness noted   Quadriceps tightness noted   Piriformis tightness noted     Transfers   Transfers Sit to Stand;Stand to Sit   Sit to Stand 7: Independent   Stand to Sit 7: Independent     Ambulation/Gait   Ambulation/Gait Yes   Ambulation/Gait Assistance 6: Modified independent (Device/Increase time)   Ambulation Distance (Feet) 100 Feet   Assistive device None   Gait Pattern Decreased stride length  stiff gait pattern initially, better as he continued   Ambulation Surface Level;Indoor                   [facility] Adult PT Treatment/Exercise - 06/10/16 0001      Transfers   Transfers Sit to Stand;Stand to Sit   Sit to Stand 7: Independent   Stand to Sit 7: Independent     Ambulation/Gait   Ambulation/Gait Yes   Ambulation/Gait Assistance 6: Modified independent (Device/Increase time)   Ambulation Distance (Feet) 100 Feet   Assistive device None   Gait Pattern Decreased stride length  pt with stiff gait initially, improved with movement   Ambulation Surface Level;Indoor                PT Education - 06/10/16 [phone]    Education provided Yes   Education Details Education on stretching program, see pt instruction, education on initiation of gym program, POC, goals   Person(s) Educated Patient   Methods Explanation;Demonstration;Handout   Comprehension Verbalized understanding;Returned demonstration          PT Short Term Goals - 06/10/16 1004      PT SHORT TERM GOAL #1   Title Pt will report compliance with initial HEP.  (Target Date:  by the end of 1st visit)   Status New           PT Long Term Goals - 06/10/16 1004      PT LONG TERM GOAL #1   Title Pt will be independent with HEP and report compliance with HEP daily.  (Target Date: By the end of 3rd visit)   Status New     PT LONG TERM  GOAL #2   Title Pt will verbalize being able to tolerate up to 20 mins on recumbant bike at personal gym in order to indicate improved cardiovascular endurance.    Status New  PT LONG TERM GOAL #3   Title Pt will report plans to attend pool for therapy either indoor or outdoor when weather permits in order to maintain gains made with gym program and maintain joint mobility.     Status New               Plan - 06/10/16 [id number]    Clinical Impression Statement Pt presents with diagnosis of psoriatic arthritis (diagnosed approx 30 years ago) with recent increase in tightness, esp during the winter months and lack of motivation to work out.  Note history of B hip replacements as well.  Upon PT evaluation, note that pt demonstrates decreased flexibility in B hamstrings, B quadriceps, and B piriformis, therefore provided initial HEP to address this.  Pt also reports performing lower pelvic rotation but cued to perform at slower pace for increased stretch.  Also eduated on initiaion of gym program (reccumbant bike) at his complex's gym to improve cardiovascular health and maintain joint mobility.  Educated on ways to make this easier for compliance.  Pt is of evolving presentation and low complexity from PT POC standpoint.  Pt will benefit from 3 follow up visits to ensure gym/exercise program going well or adapt as needed.     Rehab Potential Good   Clinical Impairments Affecting Rehab Potential pt motivation   PT Frequency Other (comment)  1 visit every 2 weeks for 3 visits total   PT Duration Other (comment)  see above   PT Treatment/Interventions Functional mobility training;Therapeutic activities;Therapeutic exercise;Patient/family education;Passive range of motion;Energy conservation;Manual techniques   PT Next Visit Plan check compliance with HEP, make additions as needed, assess on nustep to assist with gym program at home (can try elliptical),    PT Home Exercise Plan provided HEP/gym  program 06/10/16   Consulted and Agree with Plan of Care Patient      Patient will benefit from skilled therapeutic intervention in order to improve the following deficits and impairments:  Decreased mobility, Decreased range of motion, Hypomobility, Impaired perceived functional ability, Impaired flexibility, Improper body mechanics, Postural dysfunction  Visit Diagnosis: Other abnormalities of gait and mobility - Plan: PT plan of care cert/re-cert  Other symptoms and signs involving the musculoskeletal system - Plan: PT plan of care cert/re-cert     Problem List Patient Active Problem List   Diagnosis Date Noted  . Prediabetes 09/22/2014  . Hyperlipidemia 09/22/2014  . URI (upper respiratory infection) 07/07/2012  . Acute bronchitis 07/07/2012  . HYPERLIPIDEMIA 07/03/2008  . OBESITY 07/03/2008  . AVASCULAR NECROSIS, FEMORAL HEAD 07/03/2008  . Psoriatic arthritis ([place]) 05/02/2001    Cameron Sprang, PT, MPT [facility] [address] [place], [place], [address] Phone: [phone]   Fax:  [phone] 06/10/16, 10:13 AM  Name: [name] MRN: [id number] Date of Birth: 1966-02-21

## 2016-06-10 NOTE — Patient Instructions (Addendum)
Knee to Chest    Lying supine, bend involved knee to chest _2-3__ times. Repeat with other leg.  Try to put your hands on the outside of your leg just below your knee and hold for at least 60 seconds.   Do _2-3__ times per day.  Copyright  VHI. All rights reserved.   Hamstring Step 1    Straighten left knee. Keep knee level with other knee or on bolster. Hold _60__ seconds. Relax knee by returning foot to start. Keep knee straight.  Repeat _2-3__ times.  Extensors / Rotators, Supine    Lie supine, one leg straight, other leg bent, knee held by opposite hand. Gently pull knee toward opposite shoulder. Feel stretch in buttocks and outside of hip. Hold _60_ seconds. Repeat _2-3__ times per session. Do _2-3_ sessions per day.  Copyright  VHI. All rights reserved.     Start going to the gym at your complex 3 times per week:  Set an alarm on your phone so you are reminded and pick the same time to go all three days so that it becomes routine.  Start with 10 mins and use low resistance.  Build up from there time wise.  Always keep resistance mid to low range, I wouldn't go above that.    If you can get to a pool (indoor now and outdoor when warmer outside).  Start with just walking in the pool, gentle swimming and keeping arms on the outside of the pool and gently kicking legs up and down.

## 2016-06-13 ENCOUNTER — Telehealth: Payer: Self-pay | Admitting: Rheumatology

## 2016-06-13 NOTE — Telephone Encounter (Signed)
Yes, ok to give sample of enbrel

## 2016-06-13 NOTE — Telephone Encounter (Signed)
Patient advised to reach bake out to the Enbrel Coupon Support to make sure that his request has been processed.   Patient is in need of a sample.   Okay to provide sample?

## 2016-06-13 NOTE — Telephone Encounter (Signed)
Patient states he takes enbrel every 7 days and he had reapplied for the coupon card over a week ago and has not heard anything yet. Patient is now completely out of enbrel. Patient is requesting a call back as to what he should do next.

## 2016-06-14 NOTE — Telephone Encounter (Signed)
Medication Samples have been provided to the patient.  Drug name: Enbrel       Strength: 50 mg/mL       Qty: 1  [id number]  Exp.Date: 05/2018  Dosing instructions: Inject one pen SQ weekly  The patient has been instructed regarding the correct time, dose, and frequency of taking this medication, including desired effects and most common side effects.   Gwenlyn Perking 8:20 AM 06/14/2016

## 2016-06-27 ENCOUNTER — Ambulatory Visit: Payer: 59 | Admitting: Rehabilitation

## 2016-06-27 ENCOUNTER — Encounter: Payer: Self-pay | Admitting: Rehabilitation

## 2016-06-27 DIAGNOSIS — R29898 Other symptoms and signs involving the musculoskeletal system: Secondary | ICD-10-CM

## 2016-06-27 DIAGNOSIS — R2689 Other abnormalities of gait and mobility: Secondary | ICD-10-CM | POA: Diagnosis not present

## 2016-06-27 NOTE — Patient Instructions (Signed)
Bracing With Leg Raise (Quadruped)    On hands and knees find neutral spine. Tighten pelvic floor and abdominals and hold. Alternating legs, straighten and lift to hip level. Repeat _10__ times. Do __1_ times a day.  You can push through your fists if your wrist are painful placed how pictured.    Copyright  VHI. All rights reserved.   Functional Quadriceps: Sit to Stand    Sit on edge of chair, feet flat on floor. Stand upright, extending knees fully. Repeat _10___ times per set. Do __1__ sets per session. Do __2__ sessions per day.  http://orth.exer.us/734   Copyright  VHI. All rights reserved.       Start on both knees with couch or sturdy chair beside of you.  Lift one leg into position pictured above.  Then lift into standing.  You can use the couch for support as you need, but as you get stronger, try to do more and more with your legs.  Do 5 on one side (lifting and lowering the same leg) then switch and do 5 more on the other side.

## 2016-06-27 NOTE — Therapy (Signed)
[facility] [address] [place], [place], [address] Phone: [phone]   Fax:  [phone]  Physical Therapy Treatment  Patient Details  Name: [name] MRN: [id number] Date of Birth: 05/02/1966 Referring Provider: Eliezer Lofts, PA  Encounter Date: 06/27/2016      PT End of Session - 06/27/16 1303    Visit Number 2   Number of Visits 4   Date for PT Re-Evaluation 08/09/16   Authorization Type UMR   PT Start Time 0846   PT Stop Time 0929   PT Time Calculation (min) 43 min   Activity Tolerance Patient tolerated treatment well   Behavior During Therapy [facility] for tasks assessed/performed      Past Medical History:  Diagnosis Date  . History of avascular necrosis of capital femoral epiphysis 2006, 2007   Bilateral Femoral Head  . HLD (hyperlipidemia)   . Psoriatic arthritis [facility])     Past Surgical History:  Procedure Laterality Date  . JOINT REPLACEMENT Bilateral 2006, 2007   Necrosis femoral head (bilateral)    There were no vitals filed for this visit.      Subjective Assessment - 06/27/16 0851    Subjective "I've not been motivated to go to the gym yet, but I have been stretching some."    Limitations House hold activities;Walking   How long can you stand comfortably? If needed, up to 15-20 mins   How long can you walk comfortably? 30-40 mins (grocery shopping trip)   Patient Stated Goals "better drive and good habits to do some exercises."    Currently in Pain? Yes   Pain Score 0-No pain                         [facility] Adult PT Treatment/Exercise - 06/27/16 0001      Self-Care   Self-Care Other Self-Care Comments   Other Self-Care Comments  Continue to have lengthy discussion with pt regarding being motivated to go to gym to work out.  Mentioned that he may benefit from going to gym with partner/friend, walking dogs to make task more functional, again trying to plan certain  time of day and make routine.  Pt with questions regarding stretching.  Recommended he do them both before and after work out for improved flexibility and to avoid soreness.  Also recommended searching online for short work out routines that he could initiate at home.  Pt verbalized understanding.      Exercises   Exercises Other Exercises   Other Exercises  Performed seated nustep x 5 mins at level 4 resistance with BUE/LEs maintaining pace in the 80's.  Discussed with pt trying to get to 10 mins at his gym at home and therefore decreasing speed to allow for improved distance.  Performed child's pose stretch and discussed how to perform to the R or L in order to improve stretch to one side.  Performed quadruped alternating LEs x 10 reps and transitions from tall kneeling>half kneeling>standing and vice versa.  Added this to HEP, see pt instruction.                 PT Education - 06/27/16 1302    Education provided Yes   Education Details see self care, additions to HEP   Person(s) Educated Patient   Methods Explanation;Demonstration;Handout   Comprehension Verbalized understanding;Returned demonstration          PT Short Term Goals - 06/27/16 1305  PT SHORT TERM GOAL #1   Title Pt will report compliance with initial HEP.  (Target Date:  by the end of 1st visit)   Baseline partially met   Status Partially Met           PT Long Term Goals - 06/10/16 1004      PT LONG TERM GOAL #1   Title Pt will be independent with HEP and report compliance with HEP daily.  (Target Date: By the end of 3rd visit)   Status New     PT LONG TERM GOAL #2   Title Pt will verbalize being able to tolerate up to 20 mins on recumbant bike at personal gym in order to indicate improved cardiovascular endurance.    Status New     PT LONG TERM GOAL #3   Title Pt will report plans to attend pool for therapy either indoor or outdoor when weather permits in order to maintain gains made with gym  program and maintain joint mobility.     Status New               Plan - 06/27/16 1303    Clinical Impression Statement Skilled session with long discussion regarding motivation and working out and how to make as functional and compliant as possible.  Performed several therex tasks during session including nustep that he can carry over to his gym and also that he can perform in home.  Pt tolerated all exercises well.    Rehab Potential Good   Clinical Impairments Affecting Rehab Potential pt motivation   PT Frequency Other (comment)  1 visit every 2 weeks for 3 visits total   PT Duration Other (comment)  see above   PT Treatment/Interventions Functional mobility training;Therapeutic activities;Therapeutic exercise;Patient/family education;Passive range of motion;Energy conservation;Manual techniques   PT Next Visit Plan check compliance with HEP, make additions as needed, can try elliptical   PT Home Exercise Plan provided HEP/gym program 06/10/16   Consulted and Agree with Plan of Care Patient      Patient will benefit from skilled therapeutic intervention in order to improve the following deficits and impairments:  Decreased mobility, Decreased range of motion, Hypomobility, Impaired perceived functional ability, Impaired flexibility, Improper body mechanics, Postural dysfunction  Visit Diagnosis: Other abnormalities of gait and mobility  Other symptoms and signs involving the musculoskeletal system     Problem List Patient Active Problem List   Diagnosis Date Noted  . Prediabetes 09/22/2014  . Hyperlipidemia 09/22/2014  . URI (upper respiratory infection) 07/07/2012  . Acute bronchitis 07/07/2012  . HYPERLIPIDEMIA 07/03/2008  . OBESITY 07/03/2008  . AVASCULAR NECROSIS, FEMORAL HEAD 07/03/2008  . Psoriatic arthritis ([place]) 05/02/2001   Cameron Sprang, PT, MPT [facility] [address] [place], [place], [address] Phone:  [phone]   Fax:  [phone] 06/27/16, 1:14 PM  Name: [name] MRN: [id number] Date of Birth: 05/31/1965

## 2016-06-29 ENCOUNTER — Inpatient Hospital Stay (INDEPENDENT_AMBULATORY_CARE_PROVIDER_SITE_OTHER): Payer: Self-pay

## 2016-06-29 ENCOUNTER — Ambulatory Visit (INDEPENDENT_AMBULATORY_CARE_PROVIDER_SITE_OTHER): Payer: 59 | Admitting: Rheumatology

## 2016-06-29 DIAGNOSIS — M79641 Pain in right hand: Secondary | ICD-10-CM

## 2016-06-29 DIAGNOSIS — M79642 Pain in left hand: Secondary | ICD-10-CM | POA: Diagnosis not present

## 2016-06-29 NOTE — Progress Notes (Signed)
Chief complaint: Pain in bilateral hands  Patient was here to get ultrasound examination of bilateral hands. The findings are described in the imaging section. As he had very mild synovitis only we decided not to change therapy at this point. He is been spacing his Enbrel to every 10 days have advised him to go back to every 7 days on his Enbrel injections.  He'll be returning for follow-up and schedule. Joshua Merino, MD

## 2016-07-11 ENCOUNTER — Ambulatory Visit: Payer: 59 | Attending: Rheumatology | Admitting: Rehabilitation

## 2016-07-11 ENCOUNTER — Encounter: Payer: Self-pay | Admitting: Rehabilitation

## 2016-07-11 DIAGNOSIS — R29898 Other symptoms and signs involving the musculoskeletal system: Secondary | ICD-10-CM | POA: Insufficient documentation

## 2016-07-11 DIAGNOSIS — R2689 Other abnormalities of gait and mobility: Secondary | ICD-10-CM | POA: Diagnosis not present

## 2016-07-11 NOTE — Therapy (Signed)
[facility] [address] [place], [place], [address] Phone: [phone]   Fax:  [phone]  Physical Therapy Treatment  Patient Details  Name: [name] MRN: [id number] Date of Birth: Oct 24, 1965 Referring Provider: Eliezer Lofts, PA  Encounter Date: 07/11/2016      PT End of Session - 07/11/16 1137    Visit Number 3   Number of Visits 4   Date for PT Re-Evaluation 08/09/16   Authorization Type UMR   PT Start Time 0847   PT Stop Time 0930   PT Time Calculation (min) 43 min   Activity Tolerance Patient tolerated treatment well   Behavior During Therapy [facility] for tasks assessed/performed      Past Medical History:  Diagnosis Date  . History of avascular necrosis of capital femoral epiphysis 2006, 2007   Bilateral Femoral Head  . HLD (hyperlipidemia)   . Psoriatic arthritis [facility])     Past Surgical History:  Procedure Laterality Date  . JOINT REPLACEMENT Bilateral 2006, 2007   Necrosis femoral head (bilateral)    There were no vitals filed for this visit.      Subjective Assessment - 07/11/16 0852    Subjective "I did go to the gym the last day I saw you, but I have been doing the exercises pretty religiously."   Limitations House hold activities;Walking   How long can you stand comfortably? If needed, up to 15-20 mins   How long can you walk comfortably? 30-40 mins (grocery shopping trip)   Patient Stated Goals "better drive and good habits to do some exercises."    Currently in Pain? Yes   Pain Score 1    Pain Location Generalized   Pain Descriptors / Indicators Tightness   Pain Type Chronic pain   Pain Onset More than a month ago   Pain Frequency Intermittent   Aggravating Factors  sitting for long periods of time   Pain Relieving Factors getting up and moving, shower                         [facility] Adult PT Treatment/Exercise - 07/11/16 0001      Self-Care   Self-Care  Other Self-Care Comments   Other Self-Care Comments  Continue to discuss making exercise regimine and the need for continuous stretching throughout the day to maintain flexibility.      Exercises   Exercises Other Exercises   Other Exercises  Attempted to perform elliptical however once he performed x 30 secs, note increased pain in R hip, therefore discontinued.  Prior to therex, performed several stretches as pt states he did not this morning and feels somewhat tight.  Perfomed supine knee to chest stretch x 1 min each side, hamstring stretch x 1 min each side, R hip flex stretch off EOM x 2 mins.  Also attempted hip flex stretch in tall kneeling sitting back onto heels as much as possible with 30 sec hold (tolerated well).  Performed Thomas test type stretch for RLE and tolerated well with no real "stretch" therefore did not have him continue this stretch.  Did provide EOB one for home, see pt instructions.  quadruped alternating UE/LE x 5 reps with cues for maintaining wrist position as comfortable as possible.   Ended session with seated nustep x 5 mins at level 3 resistance without rest break.  Pt maintained steps per minute in the 80's.  Tolerated well but with tightness following exercise.  Educated to stretch once home.                 PT Education - 07/11/16 1137    Education provided Yes   Education Details see self care   Person(s) Educated Patient   Methods Explanation   Comprehension Verbalized understanding          PT Short Term Goals - 06/27/16 1305      PT SHORT TERM GOAL #1   Title Pt will report compliance with initial HEP.  (Target Date:  by the end of 1st visit)   Baseline partially met   Status Partially Met           PT Long Term Goals - 06/10/16 1004      PT LONG TERM GOAL #1   Title Pt will be independent with HEP and report compliance with HEP daily.  (Target Date: By the end of 3rd visit)   Status New     PT LONG TERM GOAL #2   Title Pt will  verbalize being able to tolerate up to 20 mins on recumbant bike at personal gym in order to indicate improved cardiovascular endurance.    Status New     PT LONG TERM GOAL #3   Title Pt will report plans to attend pool for therapy either indoor or outdoor when weather permits in order to maintain gains made with gym program and maintain joint mobility.     Status New               Plan - 07/11/16 1138    Clinical Impression Statement Skilled session reviewed home stretches with addition of hip flexor stretch and therex for BLE strengthening and endurance.  Continue to educate on importance of stretching multiple times per day and encouraging him to go to gym to use bike daily.    Rehab Potential Good   Clinical Impairments Affecting Rehab Potential pt motivation   PT Frequency Other (comment)  1 visit every 2 weeks for 3 visits total   PT Duration Other (comment)  see above   PT Treatment/Interventions Functional mobility training;Therapeutic activities;Therapeutic exercise;Patient/family education;Passive range of motion;Energy conservation;Manual techniques   PT Next Visit Plan check goals   PT Home Exercise Plan provided HEP/gym program 06/10/16   Consulted and Agree with Plan of Care Patient      Patient will benefit from skilled therapeutic intervention in order to improve the following deficits and impairments:  Decreased mobility, Decreased range of motion, Hypomobility, Impaired perceived functional ability, Impaired flexibility, Improper body mechanics, Postural dysfunction  Visit Diagnosis: Other abnormalities of gait and mobility  Other symptoms and signs involving the musculoskeletal system     Problem List Patient Active Problem List   Diagnosis Date Noted  . Prediabetes 09/22/2014  . Hyperlipidemia 09/22/2014  . URI (upper respiratory infection) 07/07/2012  . Acute bronchitis 07/07/2012  . HYPERLIPIDEMIA 07/03/2008  . OBESITY 07/03/2008  . AVASCULAR  NECROSIS, FEMORAL HEAD 07/03/2008  . Psoriatic arthritis ([place]) 05/02/2001    Cameron Sprang, PT, MPT [facility] [address] [place], [place], [address] Phone: [phone]   Fax:  [phone] 07/11/16, 11:40 AM  Name: [name] MRN: [id number] Date of Birth: November 10, 1965

## 2016-07-11 NOTE — Patient Instructions (Signed)
Hip flexor stretch     Lie on edge of bed or couch and let right leg hang off edge like pictured above.  You do not have to hold your left leg, just let it be comfortable.  As you improve, the more you bend your right knee, the more stretch you will get.  Hold for at least 2 mins   http://orth.exer.us/1032   Copyright  VHI. All rights reserved.

## 2016-07-18 ENCOUNTER — Telehealth: Payer: Self-pay

## 2016-07-18 NOTE — Telephone Encounter (Signed)
Last Visit: 05/17/16 Next Visit: 10/17/16 Labs: 05/17/16 WNL TB Gold: 10/2015 Neg  Okay to refill Enbrel?

## 2016-07-18 NOTE — Telephone Encounter (Signed)
Okay to refill Enbrel

## 2016-07-18 NOTE — Addendum Note (Signed)
Addended by: Carole Binning on: 07/18/2016 11:28 AM   Modules accepted: Orders

## 2016-07-18 NOTE — Telephone Encounter (Addendum)
Patient called with a refill request for Enbrel. It was last filed at [facility] on 06/06/2016 for a 30 day supply. He is out of refills and is due for his next dose on Thursday, March 22nd. Can a new Rx be sent in for him? He is requesting a 90 day supply.    Thanks! Teryl Gubler, [place], CPhT

## 2016-07-19 ENCOUNTER — Other Ambulatory Visit: Payer: Self-pay | Admitting: Pharmacist

## 2016-07-19 MED ORDER — ETANERCEPT 50 MG/ML ~~LOC~~ SOSY: 50.0000 mg | PREFILLED_SYRINGE | SUBCUTANEOUS | 0 refills | Status: DC

## 2016-07-19 MED FILL — ENBREL 50 MG/ML SYRINGE: 50 | 28 days supply | Qty: 4 | Fill #0

## 2016-07-19 NOTE — Addendum Note (Signed)
Addended by: Carole Binning on: 07/19/2016 08:20 AM   Modules accepted: Orders

## 2016-07-19 NOTE — Telephone Encounter (Signed)
Prescription sent to the pharmacy and left message to advise patient  

## 2016-07-25 ENCOUNTER — Ambulatory Visit: Payer: 59 | Admitting: Rehabilitation

## 2016-07-25 ENCOUNTER — Encounter: Payer: Self-pay | Admitting: Rehabilitation

## 2016-07-25 DIAGNOSIS — R2689 Other abnormalities of gait and mobility: Secondary | ICD-10-CM | POA: Diagnosis not present

## 2016-07-25 DIAGNOSIS — R29898 Other symptoms and signs involving the musculoskeletal system: Secondary | ICD-10-CM | POA: Diagnosis not present

## 2016-07-25 NOTE — Therapy (Signed)
[facility] [address] [place], [place], [address] Phone: [phone]   Fax:  [phone]  Physical Therapy Treatment and D/C Summary  Patient Details  Name: [name] MRN: [id number] Date of Birth: 09-12-1965 Referring Provider: Eliezer Lofts, PA  Encounter Date: 07/25/2016      PT End of Session - 07/25/16 0926    Visit Number 4   Number of Visits 4   Date for PT Re-Evaluation 08/09/16   Authorization Type UMR   PT Start Time 0847   PT Stop Time 0930   PT Time Calculation (min) 43 min   Activity Tolerance Patient tolerated treatment well   Behavior During Therapy [facility] for tasks assessed/performed      Past Medical History:  Diagnosis Date  . History of avascular necrosis of capital femoral epiphysis 2006, 2007   Bilateral Femoral Head  . HLD (hyperlipidemia)   . Psoriatic arthritis [facility])     Past Surgical History:  Procedure Laterality Date  . JOINT REPLACEMENT Bilateral 2006, 2007   Necrosis femoral head (bilateral)    There were no vitals filed for this visit.      Subjective Assessment - 07/25/16 0853    Subjective "I haven't been going to the gym.  I'm just not motivated to go and my medication makes me feel drained.  When I did the one exercise where I kick my right leg behind me, I got a sharp pain that took me out of commission for a few days."   Limitations House hold activities;Walking   How long can you stand comfortably? If needed, up to 15-20 mins   How long can you walk comfortably? 30-40 mins (grocery shopping trip)   Patient Stated Goals "better drive and good habits to do some exercises."    Currently in Pain? No/denies               TE:  Went over current HEP, see pt instruction.  Removed quadruped activity as this was causing increased R hip pain.    Self care:  See education section.                   PT Education - 07/25/16 1210    Education  provided Yes   Education Details joining gym if possible for pool therapy/exercise, continue to educate on making exercises a routine that he does at the same time daily for increased compliance.   Recommended he seek ortho MD consult again if R hip pain continues to be severe and continues to radiate to R knee.     Person(s) Educated Patient   Methods Explanation   Comprehension Verbalized understanding          PT Short Term Goals - 06/27/16 1305      PT SHORT TERM GOAL #1   Title Pt will report compliance with initial HEP.  (Target Date:  by the end of 1st visit)   Baseline partially met   Status Partially Met           PT Long Term Goals - 07/25/16 0856      PT LONG TERM GOAL #1   Title Pt will be independent with HEP and report compliance with HEP daily.  (Target Date: By the end of 3rd visit)   Baseline met 07/25/16   Status Achieved     PT LONG TERM GOAL #2   Title Pt will verbalize being able to tolerate up to 20 mins on  recumbant bike at personal gym in order to indicate improved cardiovascular endurance.    Baseline has not been going to gym regularly.   Status Not Met     PT LONG TERM GOAL #3   Title Pt will report plans to attend pool for therapy either indoor or outdoor when weather permits in order to maintain gains made with gym program and maintain joint mobility.     Status Not Met               Plan - 07/25/16 1211    Clinical Impression Statement Pt has met 1/3 LTGs due to lack of motivation to seek out gym/pool programs or attend his complex's gym.  Will D/C at this time due to lack of progression.  He has been performing his exercises, however is still only stretching up to 2 times daily.     Rehab Potential Good   Clinical Impairments Affecting Rehab Potential pt motivation   PT Frequency Other (comment)  1 visit every 2 weeks for 3 visits total   PT Duration Other (comment)  see above   PT Treatment/Interventions Functional mobility  training;Therapeutic activities;Therapeutic exercise;Patient/family education;Passive range of motion;Energy conservation;Manual techniques   PT Home Exercise Plan provided HEP/gym program 06/10/16   Consulted and Agree with Plan of Care Patient      Patient will benefit from skilled therapeutic intervention in order to improve the following deficits and impairments:  Decreased mobility, Decreased range of motion, Hypomobility, Impaired perceived functional ability, Impaired flexibility, Improper body mechanics, Postural dysfunction  Visit Diagnosis: Other abnormalities of gait and mobility  Other symptoms and signs involving the musculoskeletal system    PHYSICAL THERAPY DISCHARGE SUMMARY  Visits from Start of Care: 4  Current functional level related to goals / functional outcomes: See LTGs   Remaining deficits: Pt continues to be limited by joint stiffness, overall weakness, poor compliance with HEP and decreased motivation to attend gym/exercise program outside of therapy.     Education / Equipment: HEP  Plan: Patient agrees to discharge.  Patient goals were partially met. Patient is being discharged due to meeting the stated rehab goals.  ?????        Problem List Patient Active Problem List   Diagnosis Date Noted  . Prediabetes 09/22/2014  . Hyperlipidemia 09/22/2014  . URI (upper respiratory infection) 07/07/2012  . Acute bronchitis 07/07/2012  . HYPERLIPIDEMIA 07/03/2008  . OBESITY 07/03/2008  . AVASCULAR NECROSIS, FEMORAL HEAD 07/03/2008  . Psoriatic arthritis ([place]) 05/02/2001    Cameron Sprang, PT, MPT [facility] [address] [place], [place], [address] Phone: [phone]   Fax:  [phone] 07/25/16, 12:14 PM  Name: [name] MRN: [id number] Date of Birth: 12/27/1965

## 2016-07-25 NOTE — Patient Instructions (Addendum)
Knee to Chest    Lying supine, bend involved knee to chest _2-3__ times. Repeat with other leg.  Try to put your hands on the outside of your leg just below your knee and hold for at least 60 seconds.   Do _2-3__ times per day.  Copyright  VHI. All rights reserved.   Hamstring Step 1    Straighten left knee. Keep knee level with other knee or on bolster. Hold _60__ seconds. Relax knee by returning foot to start. Keep knee straight.  Repeat _2-3__ times.  Extensors / Rotators, Supine    Lie supine, one leg straight, other leg bent, knee held by opposite hand. Gently pull knee toward opposite shoulder. Feel stretch in buttocks and outside of hip. Hold _60_ seconds. Repeat _2-3__ times per session. Do _2-3_ sessions per day.  Copyright  VHI. All rights reserved.     Start going to the gym at your complex 3 times per week:  Set an alarm on your phone so you are reminded and pick the same time to go all three days so that it becomes routine.  Start with 10 mins and use low resistance.  Build up from there time wise.  Always keep resistance mid to low range, I wouldn't go above that.    If you can get to a pool (indoor now and outdoor when warmer outside).  Start with just walking in the pool, gentle swimming and keeping arms on the outside of the pool and gently kicking legs up and down.      Functional Quadriceps: Sit to Stand    Sit on edge of chair, feet flat on floor. Stand upright, extending knees fully. Repeat _10___ times per set. Do __1__ sets per session. Do __2__ sessions per day.  http://orth.exer.us/734   Copyright  VHI. All rights reserved.       Start on both knees with couch or sturdy chair beside of you.  Lift one leg into position pictured above.  Then lift into standing.  You can use the couch for support as you need, but as you get stronger, try to do more and more with your legs.  Do 5 on one side (lifting and lowering the same leg) then  switch and do 5 more on the other side.    Note we took out quadruped alternating UEs/LEs due to increased pain.

## 2016-08-12 MED FILL — ENBREL 50 MG/ML SYRINGE: 50 | 28 days supply | Qty: 4 | Fill #1

## 2016-08-19 ENCOUNTER — Other Ambulatory Visit: Payer: Self-pay | Admitting: Radiology

## 2016-08-19 ENCOUNTER — Telehealth: Payer: Self-pay

## 2016-08-19 DIAGNOSIS — Z79899 Other long term (current) drug therapy: Secondary | ICD-10-CM | POA: Diagnosis not present

## 2016-08-19 LAB — COMPLETE METABOLIC PANEL WITH GFR
ALT: 26 U/L (ref 9–46)
AST: 22 U/L (ref 10–35)
Albumin: 4.3 g/dL (ref 3.6–5.1)
Alkaline Phosphatase: 81 U/L (ref 40–115)
BILIRUBIN TOTAL: 0.7 mg/dL (ref 0.2–1.2)
BUN: 18 mg/dL (ref 7–25)
CHLORIDE: 105 mmol/L (ref 98–110)
CO2: 24 mmol/L (ref 20–31)
Calcium: 9.6 mg/dL (ref 8.6–10.3)
Creat: 1.08 mg/dL (ref 0.70–1.33)
GFR, EST NON AFRICAN AMERICAN: 78 mL/min (ref >=60)
Glucose, Bld: 90 mg/dL (ref 65–99)
POTASSIUM: 4.7 mmol/L (ref 3.5–5.3)
Sodium: 139 mmol/L (ref 135–146)
TOTAL PROTEIN: 7.3 g/dL (ref 6.1–8.1)

## 2016-08-19 LAB — CBC WITH DIFFERENTIAL/PLATELET
BASOS ABS: 0 {cells}/uL (ref 0–200)
Basophils Relative: 0 %
EOS ABS: 59 {cells}/uL (ref 15–500)
EOS PCT: 1 %
HCT: 47.8 % (ref 38.5–50.0)
Hemoglobin: 16.1 g/dL (ref 13.2–17.1)
LYMPHS PCT: 33 %
Lymphs Abs: 1947 cells/uL (ref 850–3900)
MCH: 31.1 pg (ref 27.0–33.0)
MCHC: 33.7 g/dL (ref 32.0–36.0)
MCV: 92.5 fL (ref 80.0–100.0)
MONOS PCT: 3 %
MPV: 11.2 fL (ref 7.5–12.5)
Monocytes Absolute: 177 cells/uL — ABNORMAL LOW (ref 200–950)
NEUTROS PCT: 63 %
Neutro Abs: 3717 cells/uL (ref 1500–7800)
PLATELETS: 143 10*3/uL (ref 140–400)
RBC: 5.17 MIL/uL (ref 4.20–5.80)
RDW: 13.9 % (ref 11.0–15.0)
WBC: 5.9 10*3/uL (ref 3.8–10.8)

## 2016-08-19 NOTE — Telephone Encounter (Signed)
Noted patient's last refill of Enbrel was on 07/19/16 at [facility].I called patient with a refill reminder call.  Patient confirms he refilled his Rx on 4/13 for a 30 day supply. He is aware he is due for labs and plans to have his labs done in clinic today. Lab hours are Monday-Friday 8:30-11:30 and 1:30-4pm. Patient voices understanding and denies any questions about his medication at this time.   [name], [place], CPhT 9:13 AM

## 2016-08-22 ENCOUNTER — Telehealth: Payer: Self-pay | Admitting: Radiology

## 2016-08-22 NOTE — Progress Notes (Signed)
Labs are within normal limits.

## 2016-08-22 NOTE — Telephone Encounter (Signed)
I have called patient to advise labs are normal  

## 2016-08-22 NOTE — Telephone Encounter (Signed)
-----   Message from Bo Merino, MD sent at 08/22/2016  9:21 AM EDT ----- Labs are within normal limits

## 2016-09-02 MED FILL — ENBREL 50 MG/ML SYRINGE: 50 | 28 days supply | Qty: 4 | Fill #2

## 2016-09-05 DIAGNOSIS — T5691XD Toxic effect of unspecified metal, accidental (unintentional), subsequent encounter: Secondary | ICD-10-CM | POA: Diagnosis not present

## 2016-09-06 DIAGNOSIS — Z96643 Presence of artificial hip joint, bilateral: Secondary | ICD-10-CM | POA: Diagnosis not present

## 2016-09-06 DIAGNOSIS — Z471 Aftercare following joint replacement surgery: Secondary | ICD-10-CM | POA: Diagnosis not present

## 2016-09-06 DIAGNOSIS — Z96642 Presence of left artificial hip joint: Secondary | ICD-10-CM | POA: Diagnosis not present

## 2016-09-06 DIAGNOSIS — Z96641 Presence of right artificial hip joint: Secondary | ICD-10-CM | POA: Diagnosis not present

## 2016-09-13 ENCOUNTER — Telehealth: Payer: Self-pay | Admitting: Rheumatology

## 2016-09-13 DIAGNOSIS — R251 Tremor, unspecified: Secondary | ICD-10-CM

## 2016-09-13 DIAGNOSIS — Z79899 Other long term (current) drug therapy: Secondary | ICD-10-CM

## 2016-09-13 NOTE — Telephone Encounter (Signed)
Patient calling because he has some concerns about Enbrel. Patient thanks he is having some side effects, and does not know if he wants to continue the meds. Patient thinks he's having tremors from it. Also, patient experiencing a huge lack of energy. Please call to advise.

## 2016-09-14 NOTE — Telephone Encounter (Signed)
Patient states he has not been feeling awful lately. Patient states he has noticed some tremors lately. Patient states when he had a cup of water in his hand this past weekend and had a noticeable shake to his left hand. Patient states he is walking very slowly and unable to walk any faster. Patient states he feels in poor condition. Patient has been advised to stop the Enbrel and to follow up with PCP.

## 2016-09-14 NOTE — Telephone Encounter (Signed)
Mr. Carlyon Shadow notifed and agrees with patient stopping Enbrel and following up with PCP.

## 2016-09-15 NOTE — Addendum Note (Signed)
Addended by: Carole Binning on: 09/15/2016 01:56 PM   Modules accepted: Orders

## 2016-09-15 NOTE — Telephone Encounter (Signed)
Discussed further with Dr. Estanislado Pandy.  Due to concern about Enbrel related adverse effects with the nervous system, she agreed with discontinuation of Enbrel, and would like to refer the patient to neurology for urgent referral.    I have informed patient.    Seth Bake, can your place neurology referral?

## 2016-09-20 ENCOUNTER — Ambulatory Visit: Payer: 59 | Admitting: Rheumatology

## 2016-09-20 ENCOUNTER — Telehealth: Payer: Self-pay | Admitting: Pharmacist

## 2016-09-20 DIAGNOSIS — M79642 Pain in left hand: Secondary | ICD-10-CM

## 2016-09-20 DIAGNOSIS — L409 Psoriasis, unspecified: Secondary | ICD-10-CM | POA: Insufficient documentation

## 2016-09-20 DIAGNOSIS — M19041 Primary osteoarthritis, right hand: Secondary | ICD-10-CM | POA: Insufficient documentation

## 2016-09-20 DIAGNOSIS — M5136 Other intervertebral disc degeneration, lumbar region: Secondary | ICD-10-CM | POA: Insufficient documentation

## 2016-09-20 DIAGNOSIS — M722 Plantar fascial fibromatosis: Secondary | ICD-10-CM | POA: Insufficient documentation

## 2016-09-20 DIAGNOSIS — M19071 Primary osteoarthritis, right ankle and foot: Secondary | ICD-10-CM | POA: Insufficient documentation

## 2016-09-20 DIAGNOSIS — Z79899 Other long term (current) drug therapy: Secondary | ICD-10-CM | POA: Insufficient documentation

## 2016-09-20 DIAGNOSIS — M79641 Pain in right hand: Secondary | ICD-10-CM | POA: Insufficient documentation

## 2016-09-20 DIAGNOSIS — M19042 Primary osteoarthritis, left hand: Secondary | ICD-10-CM | POA: Insufficient documentation

## 2016-09-20 DIAGNOSIS — M170 Bilateral primary osteoarthritis of knee: Secondary | ICD-10-CM | POA: Insufficient documentation

## 2016-09-20 DIAGNOSIS — M5030 Other cervical disc degeneration, unspecified cervical region: Secondary | ICD-10-CM | POA: Insufficient documentation

## 2016-09-20 DIAGNOSIS — M19072 Primary osteoarthritis, left ankle and foot: Secondary | ICD-10-CM

## 2016-09-20 DIAGNOSIS — Z96643 Presence of artificial hip joint, bilateral: Secondary | ICD-10-CM | POA: Insufficient documentation

## 2016-09-20 NOTE — Progress Notes (Deleted)
Office Visit Note  Patient: [name]             Date of Birth: [birth year]           MRN: [id number]             PCP: Eulas Post, MD Referring: Eulas Post, MD Visit Date: 10/03/2016 Occupation: @GUAROCC @    Subjective:  No chief complaint on file.   History of Present Illness: [name] is a 51 y.o. male ***   Activities of Daily Living:  Patient reports morning stiffness for *** {minute/hour:19697}.   Patient {ACTIONS;DENIES/REPORTS:21021675::"Denies"} nocturnal pain.  Difficulty dressing/grooming: {ACTIONS;DENIES/REPORTS:21021675::"Denies"} Difficulty climbing stairs: {ACTIONS;DENIES/REPORTS:21021675::"Denies"} Difficulty getting out of chair: {ACTIONS;DENIES/REPORTS:21021675::"Denies"} Difficulty using hands for taps, buttons, cutlery, and/or writing: {ACTIONS;DENIES/REPORTS:21021675::"Denies"}   No Rheumatology ROS completed.   PMFS History:  Patient Active Problem List   Diagnosis Date Noted  . Other psoriasis 09/20/2016  . High risk medications (not anticoagulants) long-term use 09/20/2016  . Bilateral hand pain 09/20/2016  . DDD (degenerative disc disease), cervical 09/20/2016  . History of total hip replacement, right 09/20/2016  . DDD (degenerative disc disease), lumbar 09/20/2016  . Prediabetes 09/22/2014  . Hyperlipidemia 09/22/2014  . URI (upper respiratory infection) 07/07/2012  . Acute bronchitis 07/07/2012  . HYPERLIPIDEMIA 07/03/2008  . OBESITY 07/03/2008  . AVASCULAR NECROSIS, FEMORAL HEAD 07/03/2008  . Psoriatic arthritis ([place]) 05/02/2001    Past Medical History:  Diagnosis Date  . History of avascular necrosis of capital femoral epiphysis 2006, 2007   Bilateral Femoral Head  . HLD (hyperlipidemia)   . Psoriatic arthritis ([place])     No family history on file. Past Surgical History:  Procedure Laterality Date  . JOINT REPLACEMENT Bilateral 2006, 2007   Necrosis femoral head (bilateral)   Social History   Social  History Narrative  . No narrative on file     Objective: Vital Signs: There were no vitals taken for this visit.   Physical Exam   Musculoskeletal Exam: ***  CDAI Exam: No CDAI exam completed.    Investigation: Findings:  July 2017:TB Gold was negative.   12/25/2015 X-rays of bilateral hands showed bilateral PIP and DIP narrowing.  Intercarpal joint and radiocarpal joint space narrowing.  Left ulnar styloid cyst was noted.  Erosion was noted, but there was no interval change from his previous x-rays of 2016.  Bilateral feet x-rays showed bilateral MTP and PIP narrowing with erosive changes.  Tibiotalar and talocalcaneal joint space narrowing.  There was no interval change from his previous x-rays of 2016.  labs from November of 2008 showed CMP, CBC, hepatitis panel, rheumatoid factor, CCP, UA, ANA, HLA-B27, and sedimentation rate were all within normal limits  CBC Latest Ref Rng & Units 08/19/2016 05/17/2016 03/29/2016  WBC 3.8 - 10.8 K/uL 5.9 6.1 6.8  Hemoglobin 13.2 - 17.1 g/dL 16.1 16.4 15.9  Hematocrit 38.5 - 50.0 % 47.8 49.1 46.7  Platelets 140 - 400 K/uL 143 141 149   CMP Latest Ref Rng & Units 08/19/2016 05/17/2016 03/29/2016  Glucose 65 - 99 mg/dL 90 86 82  BUN 7 - 25 mg/dL 18 20 23   Creatinine 0.70 - 1.33 mg/dL 1.08 1.01 1.12  Sodium 135 - 146 mmol/L 139 139 139  Potassium 3.5 - 5.3 mmol/L 4.7 4.7 4.5  Chloride 98 - 110 mmol/L 105 104 105  CO2 20 - 31 mmol/L 24 25 26   Calcium 8.6 - 10.3 mg/dL 9.6 9.3 9.4  Total Protein 6.1 - 8.1 g/dL  7.3 7.3 7.0  Total Bilirubin 0.2 - 1.2 mg/dL 0.7 0.8 0.7  Alkaline Phos 40 - 115 U/L 81 73 68  AST 10 - 35 U/L 22 29 24   ALT 9 - 46 U/L 26 46 31     Imaging: No results found.  Speciality Comments: No specialty comments available.    Procedures:  No procedures performed Allergies: Gold-containing drug products   Assessment / Plan:     Visit Diagnoses: Psoriatic arthritis ([place])  High risk medications (not anticoagulants)  long-term use  Bilateral hand pain  DDD (degenerative disc disease), cervical  History of total hip replacement, right  DDD (degenerative disc disease), lumbar    Orders: No orders of the defined types were placed in this encounter.  No orders of the defined types were placed in this encounter.   Face-to-face time spent with patient was *** minutes. 50% of time was spent in counseling and coordination of care.  Follow-Up Instructions: No Follow-up on file.   Amy Littrell, RT  Note - This record has been created using [employer].  Chart creation errors have been sought, but may not always  have been located. Such creation errors do not reflect on  the standard of medical care.

## 2016-09-20 NOTE — Telephone Encounter (Signed)
Patient had been worked in for visit today while Dr. Estanislado Pandy was out of the office last week.  After discussing with her, she advised that patient be evaluated by neurology before follow up with our office.  Patient's appointment was cancelled and neurology referral was placed.    Patient showed up for cancelled appointment today.  Advised patient that Dr. Estanislado Pandy wanted him to see neurology.  Patient has neurology appointment 09/30/16.  He was scheduled for follow up with Dr. Estanislado Pandy 10/03/16.    Elisabeth Most, Pharm.D., BCPS, CPP Clinical Pharmacist Pager: [phone] Phone: [phone] 09/20/2016 1:24 PM

## 2016-09-28 ENCOUNTER — Encounter: Payer: Self-pay | Admitting: Gastroenterology

## 2016-09-28 ENCOUNTER — Ambulatory Visit (INDEPENDENT_AMBULATORY_CARE_PROVIDER_SITE_OTHER): Payer: 59 | Admitting: Family Medicine

## 2016-09-28 ENCOUNTER — Encounter: Payer: Self-pay | Admitting: Family Medicine

## 2016-09-28 VITALS — BP 124/82 | HR 80 | Temp 98.8°F | Ht 69.0 in | Wt 245.0 lb

## 2016-09-28 DIAGNOSIS — Z0000 Encounter for general adult medical examination without abnormal findings: Secondary | ICD-10-CM

## 2016-09-28 LAB — LIPID PANEL
CHOLESTEROL: 210 mg/dL — AB (ref 0–200)
HDL: 46.8 mg/dL (ref >39.00)
LDL CALC: 142 mg/dL — AB (ref 0–99)
NonHDL: 163.22
TRIGLYCERIDES: 104 mg/dL (ref 0.0–149.0)
Total CHOL/HDL Ratio: 4
VLDL: 20.8 mg/dL (ref 0.0–40.0)

## 2016-09-28 LAB — CBC WITH DIFFERENTIAL/PLATELET
BASOS PCT: 0.3 % (ref 0.0–3.0)
Basophils Absolute: 0 10*3/uL (ref 0.0–0.1)
EOS ABS: 0.1 10*3/uL (ref 0.0–0.7)
Eosinophils Relative: 1.1 % (ref 0.0–5.0)
HCT: 45.4 % (ref 39.0–52.0)
Hemoglobin: 15.4 g/dL (ref 13.0–17.0)
LYMPHS ABS: 1.5 10*3/uL (ref 0.7–4.0)
Lymphocytes Relative: 24.8 % (ref 12.0–46.0)
MCHC: 33.8 g/dL (ref 30.0–36.0)
MCV: 91.4 fl (ref 78.0–100.0)
MONO ABS: 0.3 10*3/uL (ref 0.1–1.0)
Monocytes Relative: 4.4 % (ref 3.0–12.0)
NEUTROS ABS: 4.2 10*3/uL (ref 1.4–7.7)
NEUTROS PCT: 69.4 % (ref 43.0–77.0)
PLATELETS: 159 10*3/uL (ref 150.0–400.0)
RBC: 4.96 Mil/uL (ref 4.22–5.81)
RDW: 13.6 % (ref 11.5–15.5)
WBC: 6.1 10*3/uL (ref 4.0–10.5)

## 2016-09-28 LAB — HEPATIC FUNCTION PANEL
ALK PHOS: 95 U/L (ref 39–117)
ALT: 22 U/L (ref 0–53)
AST: 20 U/L (ref 0–37)
Albumin: 4.7 g/dL (ref 3.5–5.2)
BILIRUBIN DIRECT: 0.2 mg/dL (ref 0.0–0.3)
BILIRUBIN TOTAL: 0.9 mg/dL (ref 0.2–1.2)
Total Protein: 7.5 g/dL (ref 6.0–8.3)

## 2016-09-28 LAB — BASIC METABOLIC PANEL
BUN: 21 mg/dL (ref 6–23)
CHLORIDE: 106 meq/L (ref 96–112)
CO2: 30 meq/L (ref 19–32)
Calcium: 9.6 mg/dL (ref 8.4–10.5)
Creatinine, Ser: 1 mg/dL (ref 0.40–1.50)
GFR: 83.06 mL/min (ref >60.00)
Glucose, Bld: 91 mg/dL (ref 70–99)
POTASSIUM: 4.6 meq/L (ref 3.5–5.1)
Sodium: 141 mEq/L (ref 135–145)

## 2016-09-28 LAB — VITAMIN D 25 HYDROXY (VIT D DEFICIENCY, FRACTURES): VITD: 17.91 ng/mL — ABNORMAL LOW (ref 30.00–100.00)

## 2016-09-28 LAB — PSA: PSA: 1.01 ng/mL (ref 0.10–4.00)

## 2016-09-28 LAB — TSH: TSH: 0.97 u[IU]/mL (ref 0.35–4.50)

## 2016-09-28 NOTE — Progress Notes (Signed)
Subjective:     Patient ID: [name], male   DOB: 1965/10/19, 51 y.o.   MRN: [id number]  HPI Patient seen for physical exam. He has history of psoriatic arthritis and also osteoarthritis. Had previous bilateral hip replacements 2006 ? Secondary to  avascular necrosis. He is followed by rheumatology. He had been taking Enbrel but developed some upper extremity tremor and patient was concerned this was possible side effects from Enbrel. His tremor has persisted -though he stopped this about a month ago. He has pending follow-up with neurology. His wife has noted that he has somewhat of a shuffling gait which is new. No family history of Parkinson's disease.  He has frequent back pain and has benefited from muscle massage and is requesting prescription for this.  No history of colonoscopy screening. Nonsmoker. No regular alcohol use. Low risk for hepatitis C with no prior screening.  He complains of general fatigue especially with prolonged ambulation. No chest pains. No dyspnea.  Past Medical History:  Diagnosis Date  . History of avascular necrosis of capital femoral epiphysis 2006, 2007   Bilateral Femoral Head  . HLD (hyperlipidemia)   . Psoriatic arthritis [facility])    Past Surgical History:  Procedure Laterality Date  . JOINT REPLACEMENT Bilateral 2006, 2007   Necrosis femoral head (bilateral)    reports that he has never smoked. He has never used smokeless tobacco. He reports that he drinks alcohol. He reports that he does not use drugs. family history is not on file. Allergies  Allergen Reactions  . Gold-Containing Drug Products Rash    Denies oral or airway involvement - occurred in the 1980s.      Review of Systems  Constitutional: Positive for fatigue. Negative for activity change, appetite change and fever.  HENT: Negative for congestion, ear pain and trouble swallowing.   Eyes: Negative for pain and visual disturbance.  Respiratory: Negative for cough, shortness of  breath and wheezing.   Cardiovascular: Negative for chest pain and palpitations.  Gastrointestinal: Negative for abdominal distention, abdominal pain, blood in stool, constipation, diarrhea, nausea, rectal pain and vomiting.  Endocrine: Negative for polydipsia and polyuria.  Genitourinary: Negative for dysuria, hematuria and testicular pain.  Musculoskeletal: Negative for arthralgias and joint swelling.  Skin: Negative for rash.  Neurological: Positive for tremors and weakness. Negative for dizziness, seizures, syncope, numbness and headaches.  Hematological: Negative for adenopathy.  Psychiatric/Behavioral: Negative for confusion and dysphoric mood.       Objective:   Physical Exam  Constitutional: He is oriented to person, place, and time. He appears well-developed and well-nourished. No distress.  HENT:  Head: Normocephalic and atraumatic.  Right Ear: External ear normal.  Left Ear: External ear normal.  Mouth/Throat: Oropharynx is clear and moist.  Eyes: Conjunctivae and EOM are normal. Pupils are equal, round, and reactive to light.  Neck: Normal range of motion. Neck supple. No thyromegaly present.  Cardiovascular: Normal rate, regular rhythm and normal heart sounds.   No murmur heard. Pulmonary/Chest: No respiratory distress. He has no wheezes. He has no rales.  Abdominal: Soft. Bowel sounds are normal. He exhibits no distension and no mass. There is no tenderness. There is no rebound and no guarding.  Musculoskeletal: He exhibits no edema.  Lymphadenopathy:    He has no cervical adenopathy.  Neurological: He is alert and oriented to person, place, and time. He displays normal reflexes. No cranial nerve deficit.  Shows very subtle tremor upper extremities.  Skin: No rash noted.  Psychiatric:  He has a normal mood and affect.       Assessment:     Physical exam. Patient's history osteoarthritis and avascular necrosis of the hips with prior total hip replacements  bilaterally. He also psoriatic arthritis recently taken off Enbrel. He needs colonoscopy screening. Low risk for hepatitis C.  He has tremor and pending workup per neurology- rule out early Parkinson's Disease.    Plan:     -Check lab work including hepatitis C antibody -Set up screening colonoscopy -Continue close follow-up with rheumatology and neurology as scheduled -He is encouraged to lose some weight  Eulas Post MD [facility] [place]

## 2016-09-29 ENCOUNTER — Other Ambulatory Visit: Payer: Self-pay | Admitting: Family Medicine

## 2016-09-29 DIAGNOSIS — E559 Vitamin D deficiency, unspecified: Secondary | ICD-10-CM

## 2016-09-29 LAB — HEPATITIS C ANTIBODY: HCV Ab: NEGATIVE

## 2016-09-29 MED ORDER — VITAMIN D (ERGOCALCIFEROL) 1.25 MG (50000 UNIT) PO CAPS: 50000.0000 [IU] | ORAL_CAPSULE | ORAL | 0 refills | Status: DC

## 2016-09-29 MED FILL — VIT D2 1.25 MG (50,000 UNIT: 1.25 MG | 84 days supply | Qty: 12 | Fill #0

## 2016-09-30 ENCOUNTER — Ambulatory Visit: Payer: 59 | Admitting: Neurology

## 2016-09-30 ENCOUNTER — Ambulatory Visit (INDEPENDENT_AMBULATORY_CARE_PROVIDER_SITE_OTHER): Payer: 59 | Admitting: Neurology

## 2016-09-30 ENCOUNTER — Encounter: Payer: Self-pay | Admitting: Neurology

## 2016-09-30 ENCOUNTER — Other Ambulatory Visit: Payer: Self-pay | Admitting: Orthopedic Surgery

## 2016-09-30 VITALS — BP 134/87 | HR 68 | Ht 69.0 in | Wt 245.0 lb

## 2016-09-30 DIAGNOSIS — T5691XD Toxic effect of unspecified metal, accidental (unintentional), subsequent encounter: Secondary | ICD-10-CM

## 2016-09-30 DIAGNOSIS — R251 Tremor, unspecified: Secondary | ICD-10-CM | POA: Diagnosis not present

## 2016-09-30 MED FILL — traMADol HCL 50 MG TABS: 50 | 10 days supply | Qty: 80 | Fill #0

## 2016-09-30 NOTE — Progress Notes (Signed)
[facility]    Provider:  Dr Jaynee Eagles Referring Provider: Eulas Post, MD, [facility] rheumatology Dr. Patrecia Pour Primary Care Physician:  Eulas Post, MD  CC:  Tremor  HPI:  [name] is a 51 y.o. male here as a referral from Dr. [name] for tremors. Patient has a past medical history of avascular necrosis, hyperlipidemia, psoriatic arthritis on Enbrel. He has been on Enbrel for 4 years. He was shaking a little bit and didn;t think much about it but his Doristine Bosworth mentioned Parkinson's Disease. No resting tremor. He sees it in his hands mostly with action like when eating or holding water. Tremors are stable, not worsening. No FHx of tremors or Parkinson's disease. Not sure when the tremors started, maybe in the last year. No resting tremor. He drinks a lot of coffee 2-3 cups a day but unclear if this makes it worse. No other caffeinated beverages. Nothing makes it better. Most of the time it is just minor he is here just to make sure he does not have Parkinson's disease. No memory changes, he does feel a little "cloudy" sometimes but nothing significant. No other focal neurologic deficits, associated symptoms, inciting events or modifiable factors.  Reviewed notes, labs and imaging from outside physicians, which showed:  TSH wnl  Patient is seen by Dr. Patrecia Pour in rheumatology for psoriatic arthritis. He has mild synovitis.  Personally reviewed MRI images of the cervical spine from 2012 images and agree with the following:  Findings: Vertebral body height and signal are unremarkable.  0.2 cm of anterolisthesis of C4 on C5 due to facet degenerative disease is noted.  The craniocervical junction is normal and cervical cord signal is normal.  Visualized paraspinous structures are unremarkable.  C2-3:  Negative.  C3-4:  There is partial autologous fusion across the disc interspace.  The facets are ankylosed.  Central canal and foramina are widely  patent.  C4-5:  The patient has left worse than right facet degenerative disease.  The disc is uncovered without bulge or protrusion. Central canal and foramina remain open.  C5-6:  There is a disc osteophyte complex and right worse than left uncovertebral disease.  Facet arthropathy is present with some marrow edema about the joint.  The ventral thecal sac is nearly effaced.  Mild left and moderate right foraminal narrowing is identified.  C6-7:  Disc bulge and uncovertebral disease without central canal narrowing.  Mild to moderate foraminal narrowing appears worse on the left.  C7-T1:  There is an annular tear and shallow left paracentral protrusion but the central canal and foramina remain open.  IMPRESSION:  1.  Moderate right and mild left foraminal narrowing C5-6 where a disc bulge nearly effaces the ventral thecal sac.  Facet arthropathy with mild marrow edema about the left facet joint noted. 2.  Mild to moderate foraminal narrowing C6-7, worse on the left. 3.  0.2 cm anterolisthesis of C4 on C5 due to facet degenerative disease without central canal or foraminal narrowing.  Review of Systems: Patient complains of symptoms per HPI as well as the following symptoms: fatigue, fatigue. Pertinent negatives and positives per HPI. All others negative.   Social History   Social History  . Marital status: Married    Spouse name: N/A  . Number of children: 1  . Years of education: N/A   Occupational History  . Disabled    Social History Main Topics  . Smoking status: Never Smoker  . Smokeless tobacco: Never Used  . Alcohol  use 0.0 oz/week     Comment: 3-4 drinks per week  . Drug use: No  . Sexual activity: Not on file     Comment: Married   Other Topics Concern  . Not on file   Social History Narrative   Lives at home w/ his wife   Right-handed   Caffeine: 2-3 cups per day    Family History  Problem Relation Age of Onset  . Pneumonia Father   .  Tremor Neg Hx     Past Medical History:  Diagnosis Date  . History of avascular necrosis of capital femoral epiphysis 2006, 2007   Bilateral Femoral Head  . HLD (hyperlipidemia)   . Psoriatic arthritis [facility])     Past Surgical History:  Procedure Laterality Date  . JOINT REPLACEMENT Bilateral 2006, 2007   Necrosis femoral head (bilateral)    Current Outpatient Prescriptions  Medication Sig Dispense Refill  . acetaminophen (TYLENOL) 500 MG tablet Take 500 mg by mouth every 6 (six) hours as needed.    . Cholecalciferol ([employer] VITAMIN D3 DROPS/INFANT) 400 UT/0.028ML LIQD Take by mouth daily.    [name] ibuprofen (ADVIL,MOTRIN) 200 MG tablet Take 200 mg by mouth every 6 (six) hours as needed.    . Vitamin D, Ergocalciferol, (DRISDOL) 50000 units CAPS capsule Take 1 capsule (50,000 Units total) by mouth every 7 (seven) days. 12 capsule 0   No current facility-administered medications for this visit.     Allergies as of 09/30/2016 - Review Complete 09/30/2016  Allergen Reaction Noted  . Gold-containing drug products Rash 05/03/1983    Vitals: BP 134/87   Pulse 68   Ht 5\' 9"  (1.753 m)   Wt 245 lb (111.1 kg)   BMI 36.18 kg/m  Last Weight:  Wt Readings from Last 1 Encounters:  09/30/16 245 lb (111.1 kg)   Last Height:   Ht Readings from Last 1 Encounters:  09/30/16 5\' 9"  (1.753 m)   Physical exam: Exam: Gen: NAD, conversant, well nourised, obese, well groomed                     CV: RRR, no MRG. No Carotid Bruits. No peripheral edema, warm, nontender Eyes: Conjunctivae clear without exudates or hemorrhage  Neuro: Detailed Neurologic Exam  Speech:    Speech is normal; fluent and spontaneous with normal comprehension.  Cognition:    The patient is oriented to person, place, and time;     recent and remote memory intact;     language fluent;     normal attention, concentration,     fund of knowledge Cranial Nerves:    The pupils are equal, round, and reactive to light. The  fundi are normal and spontaneous venous pulsations are present. Visual fields are full to finger confrontation. Extraocular movements are intact. Trigeminal sensation is intact and the muscles of mastication are normal. The face is symmetric. The palate elevates in the midline. Hearing intact. Voice is normal. Shoulder shrug is normal. The tongue has normal motion without fasciculations.   Coordination:    Normal finger to nose and heel to shin. Normal rapid alternating movements.   Gait:    Heel-toe and tandem gait are normal.  His gait is wide based and antalgic (previous bilateral hip replacements), good arm swing.   Motor Observation:    No asymmetry, no atrophy, Mild high frquency postural tremor.  Tone:    Normal muscle tone.    Posture:    Posture is normal. normal  erect    Strength: 4/5 right hip flexion otherwise strength is V/V in the upper and lower limbs.      Sensation: intact to LT     Reflex Exam:  DTR's:    Deep tendon reflexes in the upper and lower extremities are normal bilaterally.   Toes:    The toes are downgoing bilaterally.   Clonus:    Clonus is absent.       Assessment/Plan:  51 year old male with minimal postural tremor. No parkinsonian signs pon exam. Tremor is likely medication related, essential tremor also he drinks much caffeine daily. Recommend following clinically.  Will check one lab today.   Cc:  Eulas Post, MD, [facility] rheumatology Dr. Joslyn Devon, MD  [facility] [address] [place], [place] [address]  Phone [phone] Fax [phone]

## 2016-09-30 NOTE — Patient Instructions (Addendum)
Remember to drink plenty of fluid, eat healthy meals and do not skip any meals. Try to eat protein with a every meal and eat a healthy snack such as fruit or nuts in between meals. Try to keep a regular sleep-wake schedule and try to exercise daily, particularly in the form of walking, 20-30 minutes a day, if you can.   As far as diagnostic testing: Lab today  I would like to see you back in 6 months, sooner if we need to. Please call us with any interim questions, concerns, problems, updates or refill requests.   Our phone number is [phone]. We also have an after hours call service for urgent matters and there is a physician on-call for urgent questions. For any emergencies you know to call 911 or go to the nearest emergency room  Tremor is likely one or all of the following: medication related, Essential Tremor or caffeine related.   Essential Tremor A tremor is trembling or shaking that you cannot control. Most tremors affect the hands or arms. Tremors can also affect the head, vocal cords, face, and other parts of the body. Essential tremor is a tremor without a known cause. What are the causes? Essential tremor has no known cause. What increases the risk? You may be at greater risk of essential tremor if:  You have a family member with essential tremor.  You are age 53 or older.  You take certain medicines.  What are the signs or symptoms? The main sign of a tremor is uncontrolled and unintentional rhythmic shaking of a body part.  You may have difficulty eating with a spoon or fork.  You may have difficulty writing.  You may nod your head up and down or side to side.  You may have a quivering voice.  Your tremors:  May get worse over time.  May come and go.  May be more noticeable on one side of your body.  May get worse due to stress, fatigue, caffeine, and extreme heat or cold.  How is this diagnosed? Your health care provider can diagnose essential tremor  based on your symptoms, medical history, and a physical examination. There is no single test to diagnose an essential tremor. However, your health care provider may perform a variety of tests to rule out other conditions. Tests may include:  Blood and urine tests.  Imaging studies of your brain, such as: ? CT scan. ? MRI.  A test that measures involuntary muscle movement (electromyogram).  How is this treated? Your tremors may go away without treatment. Mild tremors may not need treatment if they do not affect your day-to-day life. Severe tremors may need to be treated using one or a combination of the following options:  Medicines. This may include medicine that is injected.  Lifestyle changes.  Physical therapy.  Follow these instructions at home:  Take medicines only as directed by your health care provider.  Limit alcohol intake to no more than 1 drink per day for nonpregnant women and 2 drinks per day for men. One drink equals 12 oz of beer, 5 oz of wine, or 1 oz of hard liquor.  Do not use any tobacco products, including cigarettes, chewing tobacco, or electronic cigarettes. If you need help quitting, ask your health care provider.  Take medicines only as directed by your health care provider.  Avoid extreme heat or cold.  Limit the amount of caffeine you consumeas directed by your health care provider.  Try to get eight  hours of sleep each night.  Find ways to manage your stress, such as meditation or yoga.  Keep all follow-up visits as directed by your health care provider. This is important. This includes any physical therapy visits. Contact a health care provider if:  You experience any changes in the location or intensity of your tremors.  You start having a tremor after starting a new medicine.  You have tremor with other symptoms such as: ? Numbness. ? Tingling. ? Pain. ? Weakness.  Your tremor gets worse.  Your tremor interferes with your daily  life. This information is not intended to replace advice given to you by your health care provider. Make sure you discuss any questions you have with your health care provider. Document Released: 05/09/2014 Document Revised: 09/24/2015 Document Reviewed: 10/14/2013 Elsevier Interactive Patient Education  2018 [employer].  Tremor A tremor is trembling or shaking that you cannot control. Most tremors affect the hands or arms. Tremors can also affect the head, vocal cords, face, and other parts of the body. There are many types of tremors. Common types include:  Essential tremor. These usually occur in people over the age of 57. It may run in families and can happen in otherwise healthy people.  Resting tremor. These occur when the muscles are at rest, such as when your hands are resting in your lap. People with Parkinson disease often have resting tremors.  Postural tremor. These occur when you try to hold a pose, such as keeping your hands outstretched.  Kinetic tremor. These occur during purposeful movement, such as trying to touch a finger to your nose.  Task-specific tremor. These may occur when you perform tasks such as handwriting, speaking, or standing.  Psychogenic tremor. These dramatically lessen or disappear when you are distracted. They can happen in people of all ages.  Some types of tremors have no known cause. Tremors can also be a symptom of nervous system problems (neurological disorders) that may occur with aging. Some tremors go away with treatment while others do not. Follow these instructions at home: Watch your tremor for any changes. The following actions may help to lessen any discomfort you are feeling:  Take medicines only as directed by your health care provider.  Limit alcohol intake to no more than 1 drink per day for nonpregnant women and 2 drinks per day for men. One drink equals 12 oz of beer, 5 oz of wine, or 1 oz of hard liquor.  Do not use any tobacco  products, including cigarettes, chewing tobacco, or electronic cigarettes. If you need help quitting, ask your health care provider.  Avoid extreme heat or cold.  Limit the amount of caffeine you consumeas directed by your health care provider.  Try to get 8 hours of sleep each night.  Find ways to manage your stress, such as meditation or yoga.  Keep all follow-up visits as directed by your health care provider. This is important.  Contact a health care provider if:  You start having a tremor after starting a new medicine.  You have tremor with other symptoms such as: ? Numbness. ? Tingling. ? Pain. ? Weakness.  Your tremor gets worse.  Your tremor interferes with your day-to-day life. This information is not intended to replace advice given to you by your health care provider. Make sure you discuss any questions you have with your health care provider. Document Released: 04/08/2002 Document Revised: 12/20/2015 Document Reviewed: 10/14/2013 Elsevier Interactive Patient Education  [employer].

## 2016-10-02 ENCOUNTER — Encounter: Payer: Self-pay | Admitting: Neurology

## 2016-10-03 ENCOUNTER — Ambulatory Visit: Payer: 59 | Admitting: Rheumatology

## 2016-10-04 ENCOUNTER — Telehealth: Payer: Self-pay | Admitting: *Deleted

## 2016-10-04 LAB — HEAVY METALS, BLOOD
Arsenic: 7 ug/L (ref 2–23)
Lead, Blood: NOT DETECTED ug/dL (ref 0–19)
Mercury: 1.1 ug/L (ref 0.0–14.9)

## 2016-10-04 NOTE — Telephone Encounter (Signed)
Called and spoke with patient about normal labs per AA,MD note. Pt verbalized understanding and has no further questions.

## 2016-10-04 NOTE — Telephone Encounter (Signed)
-----   Message from Melvenia Beam, MD sent at 10/04/2016  1:26 PM EDT ----- Labs normal

## 2016-10-12 ENCOUNTER — Other Ambulatory Visit: Payer: 59

## 2016-10-14 NOTE — Progress Notes (Signed)
Office Visit Note  Patient: [name]             Date of Birth: 1966-04-24           MRN: [id number]             PCP: Kristian Covey, MD Referring: Kristian Covey, MD Visit Date: 10/17/2016 Occupation: @GUAROCC @    Subjective:  Pain of the Right Knee; Pain of the Right Hip; and Medication Management (d/c Enbrel 2- months ago )   History of Present Illness: [name] is a 51 y.o. male  Last seen in our office on 06/29/2016 for ultrasound examination of bilateral hands by Dr. 07/01/2016. She noted that there was very mild synovitis on examination and there was no need to change his therapy at this point. Because he was spacing his Enbrel to every 10 days, Dr. Corliss Skains advised patient to go back to every 7 days.  Today, patient presents for follow-up. On the last visit 06/29/2016, the ultrasound showed some synovitis and Dr. 07/01/2016 advised him to do Enbrel every 7 days instead of every 10 days to manage this synovitis. Patient has been off of Enbrel now for about 2 months while he sorts out some tremors that he's been having. Specifically, patient has shaking of his hands while at rest. This is when it's most prominent. He does not have any tremors while actively using his hands. He saw Dr. Corliss Skains on 09/30/2016  Hip replacement in 2006 with metal hardware. Patient was told the metal hardware is seeping into patient's blood.  As a result, Dr. 2007, orthopedist, will replace the metal with a ceramic hardware. The potential surgery date for this is 11/23/2016 .  Pt off of ENBREL due to sorting out "tremors" for 2 months. When patient did flare in the past, he had joint pain all over. He reports that despite being off of Enbrel for 2 months, he is not flaring anywhere. His main pain is coming from her ears right hip going to the right knee.  As reported earlier, that's probably because of the right hip replacement that needs to be updated. He has seen Dr. 11/25/2016,  neurologist, 09/30/2016.  Please see notes in Epic for full details from her office visit.   Activities of Daily Living:  Patient reports morning stiffness for 15 minutes.   Patient Reports nocturnal pain.  Difficulty dressing/grooming: Reports Difficulty climbing stairs: Reports Difficulty getting out of chair: Reports Difficulty using hands for taps, buttons, cutlery, and/or writing: Reports   Review of Systems  Constitutional: Negative for fatigue.  HENT: Negative for mouth sores and mouth dryness.   Eyes: Negative for dryness.  Respiratory: Negative for shortness of breath.   Gastrointestinal: Negative for constipation and diarrhea.  Musculoskeletal: Negative for myalgias and myalgias.  Skin: Negative for sensitivity to sunlight.  Neurological: Negative for memory loss.  Psychiatric/Behavioral: Negative for sleep disturbance.    PMFS History:  Patient Active Problem List   Diagnosis Date Noted  . High risk medications (not anticoagulants) long-term use 09/20/2016  . Bilateral hand pain 09/20/2016  . DDD (degenerative disc disease), cervical 09/20/2016  . History of total hip replacement, bilateral 09/20/2016  . DDD (degenerative disc disease), lumbar 09/20/2016  . Bilateral plantar fasciitis 09/20/2016  . Psoriasis 09/20/2016  . Primary osteoarthritis of both feet 09/20/2016  . Primary osteoarthritis of both hands 09/20/2016  . Primary osteoarthritis of both knees 09/20/2016  . Prediabetes 09/22/2014  . Hyperlipidemia 09/22/2014  .  URI (upper respiratory infection) 07/07/2012  . Acute bronchitis 07/07/2012  . HYPERLIPIDEMIA 07/03/2008  . OBESITY 07/03/2008  . AVASCULAR NECROSIS, FEMORAL HEAD 07/03/2008  . Psoriatic arthritis ([place]) 05/02/2001    Past Medical History:  Diagnosis Date  . History of avascular necrosis of capital femoral epiphysis 2006, 2007   Bilateral Femoral Head  . HLD (hyperlipidemia)   . Psoriatic arthritis ([place])     Family History  Problem  Relation Age of Onset  . Pneumonia Father   . Tremor Neg Hx    Past Surgical History:  Procedure Laterality Date  . JOINT REPLACEMENT Bilateral 2006, 2007   Necrosis femoral head (bilateral)   Social History   Social History Narrative   Lives at home w/ his wife   Right-handed   Caffeine: 2-3 cups per day     Objective: Vital Signs: BP 140/84   Pulse 80   Resp 14   Ht _0  (1.753 m)   Wt 245 lb (111.1 kg)   BMI 36.18 kg/m    Physical Exam  Constitutional: He is oriented to person, place, and time. He appears well-developed and well-nourished.  HENT:  Head: Normocephalic and atraumatic.  Eyes: Conjunctivae and EOM are normal. Pupils are equal, round, and reactive to light.  Neck: Normal range of motion. Neck supple.  Cardiovascular: Normal rate, regular rhythm and normal heart sounds.  Exam reveals no gallop and no friction rub.   No murmur heard. Pulmonary/Chest: Effort normal and breath sounds normal. No respiratory distress. He has no wheezes. He has no rales. He exhibits no tenderness.  Abdominal: Soft. He exhibits no distension and no mass. There is no tenderness. There is no guarding.  Musculoskeletal: Normal range of motion.  Lymphadenopathy:    He has no cervical adenopathy.  Neurological: He is alert and oriented to person, place, and time. He exhibits normal muscle tone. Coordination normal.  Skin: Skin is warm and dry. Capillary refill takes less than 2 seconds. No rash noted.  Psychiatric: He has a normal mood and affect. His behavior is normal. Judgment and thought content normal.  Vitals reviewed.    Musculoskeletal Exam:  Full range of motion of all joints except decreased fist formation. (History of this for years.) Grip strength decreased bilaterally Fiber myalgia tender points absent  CDAI Exam: CDAI Homunculus Exam:   Swelling:  Left hand: 1st MCP and 3rd MCP  Joint Counts:  CDAI Tender Joint count: 0 CDAI Swollen Joint count: 2  Global  Assessments:  Patient Global Assessment: 8 Provider Global Assessment: 8  CDAI Calculated Score: 18 Left first and third MCP with swelling but no synovitis. Patient is not tender to palpation. Patient's pain is coming from his right hip going to his right knee secondary to hardware problems. He describes the pain between 3 and 8 on a scale of 0-10. When he is taking his Tylenol and other pain medication, his pain goes down to her 3 but he is never without any pain. I do not think this is arthritis related.   Investigation: No additional findings. Office Visit on 09/30/2016  Component Date Value Ref Range Status  . Lead, Blood 09/30/2016 None Detected  0 - 19 ug/dL Final   Comment:                           Environmental Exposure:  WHO Recommendation    <20                           Occupational Exposure:                            OSHA Lead Std          40                            BEI                    30                                 Detection Limit =  1   . Arsenic 09/30/2016 7  2 - 23 ug/L Final                                   Detection Limit = 1  . Mercury 09/30/2016 1.1  0.0 - 14.9 ug/L Final   Comment:                         Environmental Exposure:  <15.0                         Occupational Exposure:                          BEI - Inorganic Mercury: 15.0                                 Detection Limit =  1.0   Office Visit on 09/28/2016  Component Date Value Ref Range Status  . Sodium 09/28/2016 141  135 - 145 mEq/L Final  . Potassium 09/28/2016 4.6  3.5 - 5.1 mEq/L Final  . Chloride 09/28/2016 106  96 - 112 mEq/L Final  . CO2 09/28/2016 30  19 - 32 mEq/L Final  . Glucose, Bld 09/28/2016 91  70 - 99 mg/dL Final  . BUN 09/28/2016 21  6 - 23 mg/dL Final  . Creatinine, Ser 09/28/2016 1.00  0.40 - 1.50 mg/dL Final  . Calcium 09/28/2016 9.6  8.4 - 10.5 mg/dL Final  . GFR 09/28/2016 83.06  >60.00 mL/min Final  . Cholesterol 09/28/2016  210* 0 - 200 mg/dL Final   ATP III Classification       Desirable:  < 200 mg/dL               Borderline High:  200 - 239 mg/dL          High:  > = 240 mg/dL  . Triglycerides 09/28/2016 104.0  0.0 - 149.0 mg/dL Final   Normal:  <150 mg/dLBorderline High:  150 - 199 mg/dL  . HDL 09/28/2016 46.80  >39.00 mg/dL Final  . VLDL 09/28/2016 20.8  0.0 - 40.0 mg/dL Final  . LDL Cholesterol 09/28/2016 142* 0 - 99 mg/dL Final  . Total CHOL/HDL Ratio 09/28/2016 4   Final  Men          Women1/2 Average Risk     3.4          3.3Average Risk          5.0          4.42X Average Risk          9.6          7.13X Average Risk          15.0          11.0                      . NonHDL 09/28/2016 163.22   Final   NOTE:  Non-HDL goal should be 30 mg/dL higher than patient's LDL goal (i.e. LDL goal of < 70 mg/dL, would have non-HDL goal of < 100 mg/dL)  . WBC 09/28/2016 6.1  4.0 - 10.5 K/uL Final  . RBC 09/28/2016 4.96  4.22 - 5.81 Mil/uL Final  . Hemoglobin 09/28/2016 15.4  13.0 - 17.0 g/dL Final  . HCT 09/28/2016 45.4  39.0 - 52.0 % Final  . MCV 09/28/2016 91.4  78.0 - 100.0 fl Final  . MCHC 09/28/2016 33.8  30.0 - 36.0 g/dL Final  . RDW 09/28/2016 13.6  11.5 - 15.5 % Final  . Platelets 09/28/2016 159.0  150.0 - 400.0 K/uL Final  . Neutrophils Relative % 09/28/2016 69.4  43.0 - 77.0 % Final  . Lymphocytes Relative 09/28/2016 24.8  12.0 - 46.0 % Final  . Monocytes Relative 09/28/2016 4.4  3.0 - 12.0 % Final  . Eosinophils Relative 09/28/2016 1.1  0.0 - 5.0 % Final  . Basophils Relative 09/28/2016 0.3  0.0 - 3.0 % Final  . Neutro Abs 09/28/2016 4.2  1.4 - 7.7 K/uL Final  . Lymphs Abs 09/28/2016 1.5  0.7 - 4.0 K/uL Final  . Monocytes Absolute 09/28/2016 0.3  0.1 - 1.0 K/uL Final  . Eosinophils Absolute 09/28/2016 0.1  0.0 - 0.7 K/uL Final  . Basophils Absolute 09/28/2016 0.0  0.0 - 0.1 K/uL Final  . TSH 09/28/2016 0.97  0.35 - 4.50 uIU/mL Final  . Total Bilirubin 09/28/2016 0.9  0.2 - 1.2 mg/dL  Final  . Bilirubin, Direct 09/28/2016 0.2  0.0 - 0.3 mg/dL Final  . Alkaline Phosphatase 09/28/2016 95  39 - 117 U/L Final  . AST 09/28/2016 20  0 - 37 U/L Final  . ALT 09/28/2016 22  0 - 53 U/L Final  . Total Protein 09/28/2016 7.5  6.0 - 8.3 g/dL Final  . Albumin 09/28/2016 4.7  3.5 - 5.2 g/dL Final  . VITD 09/28/2016 17.91* 30.00 - 100.00 ng/mL Final  . HCV Ab 09/28/2016 NEGATIVE  NEGATIVE Final  . PSA 09/28/2016 1.01  0.10 - 4.00 ng/mL Final  Orders Only on 08/19/2016  Component Date Value Ref Range Status  . WBC 08/19/2016 5.9  3.8 - 10.8 K/uL Final  . RBC 08/19/2016 5.17  4.20 - 5.80 MIL/uL Final  . Hemoglobin 08/19/2016 16.1  13.2 - 17.1 g/dL Final  . HCT 08/19/2016 47.8  38.5 - 50.0 % Final  . MCV 08/19/2016 92.5  80.0 - 100.0 fL Final  . MCH 08/19/2016 31.1  27.0 - 33.0 pg Final  . MCHC 08/19/2016 33.7  32.0 - 36.0 g/dL Final  . RDW 08/19/2016 13.9  11.0 - 15.0 % Final  . Platelets 08/19/2016 143  140 - 400 K/uL Final  . MPV 08/19/2016 11.2  7.5 - 12.5 fL Final  . Neutro Abs 08/19/2016 3717  1,500 - 7,800 cells/uL Final  . Lymphs Abs 08/19/2016 1947  850 - 3,900 cells/uL Final  . Monocytes Absolute 08/19/2016 177* 200 - 950 cells/uL Final  . Eosinophils Absolute 08/19/2016 59  15 - 500 cells/uL Final  . Basophils Absolute 08/19/2016 0  0 - 200 cells/uL Final  . Neutrophils Relative % 08/19/2016 63  % Final  . Lymphocytes Relative 08/19/2016 33  % Final  . Monocytes Relative 08/19/2016 3  % Final  . Eosinophils Relative 08/19/2016 1  % Final  . Basophils Relative 08/19/2016 0  % Final  . Smear Review 08/19/2016 Criteria for review not met   Final  . Sodium 08/19/2016 139  135 - 146 mmol/L Final  . Potassium 08/19/2016 4.7  3.5 - 5.3 mmol/L Final  . Chloride 08/19/2016 105  98 - 110 mmol/L Final  . CO2 08/19/2016 24  20 - 31 mmol/L Final  . Glucose, Bld 08/19/2016 90  65 - 99 mg/dL Final  . BUN 08/19/2016 18  7 - 25 mg/dL Final  . Creat 08/19/2016 1.08  0.70 - 1.33 mg/dL  Final   Comment:   For patients > or = 53 years of age: The upper reference limit for Creatinine is approximately 13% higher for people identified as African-American.     . Total Bilirubin 08/19/2016 0.7  0.2 - 1.2 mg/dL Final  . Alkaline Phosphatase 08/19/2016 81  40 - 115 U/L Final  . AST 08/19/2016 22  10 - 35 U/L Final  . ALT 08/19/2016 26  9 - 46 U/L Final  . Total Protein 08/19/2016 7.3  6.1 - 8.1 g/dL Final  . Albumin 08/19/2016 4.3  3.6 - 5.1 g/dL Final  . Calcium 08/19/2016 9.6  8.6 - 10.3 mg/dL Final  . GFR, Est African American 08/19/2016 >89  >=60 mL/min Final  . GFR, Est Non African American 08/19/2016 78  >=60 mL/min Final  Office Visit on 05/17/2016  Component Date Value Ref Range Status  . WBC 05/17/2016 6.1  3.8 - 10.8 K/uL Final  . RBC 05/17/2016 5.25  4.20 - 5.80 MIL/uL Final  . Hemoglobin 05/17/2016 16.4  13.2 - 17.1 g/dL Final  . HCT 05/17/2016 49.1  38.5 - 50.0 % Final  . MCV 05/17/2016 93.5  80.0 - 100.0 fL Final  . MCH 05/17/2016 31.2  27.0 - 33.0 pg Final  . MCHC 05/17/2016 33.4  32.0 - 36.0 g/dL Final  . RDW 05/17/2016 13.8  11.0 - 15.0 % Final  . Platelets 05/17/2016 141  140 - 400 K/uL Final  . MPV 05/17/2016 10.7  7.5 - 12.5 fL Final  . Neutro Abs 05/17/2016 3721  1,500 - 7,800 cells/uL Final  . Lymphs Abs 05/17/2016 1952  850 - 3,900 cells/uL Final  . Monocytes Absolute 05/17/2016 305  200 - 950 cells/uL Final  . Eosinophils Absolute 05/17/2016 122  15 - 500 cells/uL Final  . Basophils Absolute 05/17/2016 0  0 - 200 cells/uL Final  . Neutrophils Relative % 05/17/2016 61  % Final  . Lymphocytes Relative 05/17/2016 32  % Final  . Monocytes Relative 05/17/2016 5  % Final  . Eosinophils Relative 05/17/2016 2  % Final  . Basophils Relative 05/17/2016 0  % Final  . Smear Review 05/17/2016 Criteria for review not met   Final  . Sodium 05/17/2016 139  135 - 146 mmol/L Final  . Potassium 05/17/2016 4.7  3.5 - 5.3  mmol/L Final  . Chloride 05/17/2016 104   98 - 110 mmol/L Final  . CO2 05/17/2016 25  20 - 31 mmol/L Final  . Glucose, Bld 05/17/2016 86  65 - 99 mg/dL Final  . BUN 05/17/2016 20  7 - 25 mg/dL Final  . Creat 05/17/2016 1.01  0.70 - 1.33 mg/dL Final   Comment:   For patients > or = 53 years of age: The upper reference limit for Creatinine is approximately 13% higher for people identified as African-American.     . Total Bilirubin 05/17/2016 0.8  0.2 - 1.2 mg/dL Final  . Alkaline Phosphatase 05/17/2016 73  40 - 115 U/L Final  . AST 05/17/2016 29  10 - 35 U/L Final  . ALT 05/17/2016 46  9 - 46 U/L Final  . Total Protein 05/17/2016 7.3  6.1 - 8.1 g/dL Final  . Albumin 05/17/2016 4.4  3.6 - 5.1 g/dL Final  . Calcium 05/17/2016 9.3  8.6 - 10.3 mg/dL Final  . GFR, Est African American 05/17/2016 >89  >=60 mL/min Final  . GFR, Est Non African American 05/17/2016 85  >=60 mL/min Final     Imaging: No results found.     Rash to right anterior tib-fib for about a week. Perhaps it psoriasis. Patient states it is slightly painful to palpation. He has been off of Enbrel for about 2 months.  Speciality Comments: No specialty comments available.    Procedures:  No procedures performed Allergies: Gold-containing drug products   Assessment / Plan:     Visit Diagnoses: Psoriatic arthropathy ([place])  Other psoriasis  High risk medications (not anticoagulants) long-term use - 10/17/2016: Enbrel every 7 days; since ultrasound showed patient had mild synovitis on ultrasound exam in march 2018.  Bilateral hand pain   Plan: #1: Psoriatic arthritis. No active disease. No joint pain from psoriatic arthritis. Off of Enbrel for the last 2 months. Was on hold because patient wanted to be evaluated for tremors and wanted to see if holding off on the Enbrel would resolve his tremors. He has not seen any improvement in his tremors while being off of Enbrel.  #2: Psoriasis. No flare of psoriasis. Over the last 5 days, he's noticed a  rash on the anterior tib-fib area. Dr. Estanislado Pandy looked at it and does not think it is a psoriatic lesion. Patient does report that it's mild tenderness to palpation.  #3: High risk prescription. On Enbrel every 10 days but based on ultrasound of bilateral hands which showed some synovitis on examination by Dr. Estanislado Pandy in February 2018 visit, patient was advised to go back to Enbrel every 7 days. Patient had labs done on 09/28/2016.  #4: Bilateral hand pain. Unable to make a full fist. History of same.  #5: Vitamin D deficiency on 09/28/2016 labs:===> VITD 09/28/2016 17.91* 30.00 - 100.00 ng/mL   Dr. [name] treated his low vitamin D. In addition, patient is taking over-the-counter liquid vitamin D, 1000 IUs every. Patient's wife, an Therapist, sports, feels that the liquid vitamin D may be better absorbed since since she has a history of low vitamin D, she felt the liquid vitamin D in addition to the prescription vitamin D would help him get his serum vitamin D levels better. Since he has an upcoming surgery on 11/23/2016, the lab to draw blood for that surgery and patient will get a repeat vitamin D level checked at that time.  #6: Patient's tremor has been diagnosed as essential tremors by Dr. Jaynee Eagles. See 09/30/2016  visit for full details. Reviewing the patient's chart, the tremors have been diagnosed as essential tremors. Patient was advised to decrease his caffeine intake. Since patient has been off of Enbrel for 2 months and has not noted any improvement in his tremors, it is unlikely that his tremors are as a result of Enbrel use.  Patient was advised that he can restart Enbrel now and take it every week. When he has a right hip surgery, scheduled for 11/23/2016, he can stop his Enbrel a week before the surgery. He can restart the Enbrel a week after cleared by his surgeon after the surgery. Patient states that he does not have any flares despite being off of Enbrel for the last 2 months. He  feels that he can continue to be off of Enbrel until after the surgery. Dr. Estanislado Pandy and I felt that if this is patient's preference, it will be fine to hold off on Enbrel until after the surgery.  #7: Return to clinic in mid to late August 2018.   Orders: No orders of the defined types were placed in this encounter.  No orders of the defined types were placed in this encounter.   Face-to-face time spent with patient was 30 minutes. 50% of time was spent in counseling and coordination of care.  Follow-Up Instructions: Return in about 2 months (around 12/17/2016) for PsA,Ps,(off of ENBREL since april 2018), lesion right anterior tib/fib, Rt hip pending surgery 7-25.   Eliezer Lofts, PA-C  I had detailed discussion with the pt.  Pt. Had no synovitis on exam today. He would prefer to hold off Enbrel till his THR. I examined and evaluated the patient with Eliezer Lofts PA. The plan of care was discussed as noted above.  Bo Merino, MD Note - This record has been created using Editor, commissioning.  Chart creation errors have been sought, but may not always  have been located. Such creation errors do not reflect on  the standard of medical care.

## 2016-10-17 ENCOUNTER — Ambulatory Visit (INDEPENDENT_AMBULATORY_CARE_PROVIDER_SITE_OTHER): Payer: 59 | Admitting: Rheumatology

## 2016-10-17 ENCOUNTER — Encounter: Payer: Self-pay | Admitting: Rheumatology

## 2016-10-17 VITALS — BP 140/84 | HR 80 | Resp 14 | Ht 69.0 in | Wt 245.0 lb

## 2016-10-17 DIAGNOSIS — Z79899 Other long term (current) drug therapy: Secondary | ICD-10-CM | POA: Diagnosis not present

## 2016-10-17 DIAGNOSIS — L4050 Arthropathic psoriasis, unspecified: Secondary | ICD-10-CM

## 2016-10-17 DIAGNOSIS — M79641 Pain in right hand: Secondary | ICD-10-CM | POA: Diagnosis not present

## 2016-10-17 DIAGNOSIS — L408 Other psoriasis: Secondary | ICD-10-CM | POA: Diagnosis not present

## 2016-10-17 DIAGNOSIS — M79642 Pain in left hand: Secondary | ICD-10-CM

## 2016-10-31 ENCOUNTER — Ambulatory Visit: Payer: Self-pay | Admitting: Orthopedic Surgery

## 2016-10-31 NOTE — Progress Notes (Signed)
Please place orders in EPIC as patient is being scheduled for a pre-op appointment! Thank you! 

## 2016-11-14 ENCOUNTER — Other Ambulatory Visit (HOSPITAL_COMMUNITY): Payer: Self-pay | Admitting: Emergency Medicine

## 2016-11-14 NOTE — Progress Notes (Signed)
LOV neuro 10-17-16 Dr Carlyon Shadow epic

## 2016-11-14 NOTE — Patient Instructions (Signed)
[name]  11/14/2016   Your procedure is scheduled on: 11-23-16  Report to [name] Main  Entrance   Report to admitting at 815AM   Call this number if you have problems the morning of surgery  [phone]   Remember: ONLY 1 PERSON MAY GO WITH YOU TO SHORT STAY TO GET  READY MORNING OF YOUR SURGERY.  Do not eat food or drink liquids :After Midnight.     Take these medicines the morning of surgery with A SIP OF WATER: tylenol as needed, tramadol as needed                                You may not have any metal on your body including hair pins and              piercings  Do not wear jewelry, make-up, lotions, powders or perfumes, deodorant              Men may shave face and neck.   Do not bring valuables to the hospital. [name].  Contacts, dentures or bridgework may not be worn into surgery.  Leave suitcase in the car. After surgery it may be brought to your room.                Please read over the following fact sheets you were given: _____________________________________________________________________        [name] - Preparing for Surgery Before surgery, you can play an important role.  Because skin is not sterile, your skin needs to be as free of germs as possible.  You can reduce the number of germs on your skin by washing with CHG (chlorahexidine gluconate) soap before surgery.  CHG is an antiseptic cleaner which kills germs and bonds with the skin to continue killing germs even after washing. Please DO NOT use if you have an allergy to CHG or antibacterial soaps.  If your skin becomes reddened/irritated stop using the CHG and inform your nurse when you arrive at Short Stay. Do not shave (including legs and underarms) for at least 48 hours prior to the first CHG shower.  You may shave your face/neck. Please follow these instructions carefully:  1.  Shower with CHG Soap the night  before surgery and the  morning of Surgery.  2.  If you choose to wash your hair, wash your hair first as usual with your  normal  shampoo.  3.  After you shampoo, rinse your hair and body thoroughly to remove the  shampoo.                           4.  Use CHG as you would any other liquid soap.  You can apply chg directly  to the skin and wash                       Gently with a scrungie or clean washcloth.  5.  Apply the CHG Soap to your body ONLY FROM THE NECK DOWN.   Do not use on face/ open  Wound or open sores. Avoid contact with eyes, ears mouth and genitals (private parts).                       Wash face,  Genitals (private parts) with your normal soap.             6.  Wash thoroughly, paying special attention to the area where your surgery  will be performed.  7.  Thoroughly rinse your body with warm water from the neck down.  8.  DO NOT shower/wash with your normal soap after using and rinsing off  the CHG Soap.                9.  Pat yourself dry with a clean towel.            10.  Wear clean pajamas.            11.  Place clean sheets on your bed the night of your first shower and do not  sleep with pets. Day of Surgery : Do not apply any lotions/deodorants the morning of surgery.  Please wear clean clothes to the hospital/surgery center.  FAILURE TO FOLLOW THESE INSTRUCTIONS MAY RESULT IN THE CANCELLATION OF YOUR SURGERY PATIENT SIGNATURE_________________________________  NURSE SIGNATURE__________________________________  ________________________________________________________________________   [name]  An incentive spirometer is a tool that can help keep your lungs clear and active. This tool measures how well you are filling your lungs with each breath. Taking long deep breaths may help reverse or decrease the chance of developing breathing (pulmonary) problems (especially infection) following:  A long period of time when you are  unable to move or be active. BEFORE THE PROCEDURE   If the spirometer includes an indicator to show your best effort, your nurse or respiratory therapist will set it to a desired goal.  If possible, sit up straight or lean slightly forward. Try not to slouch.  Hold the incentive spirometer in an upright position. INSTRUCTIONS FOR USE  1. Sit on the edge of your bed if possible, or sit up as far as you can in bed or on a chair. 2. Hold the incentive spirometer in an upright position. 3. Breathe out normally. 4. Place the mouthpiece in your mouth and seal your lips tightly around it. 5. Breathe in slowly and as deeply as possible, raising the piston or the ball toward the top of the column. 6. Hold your breath for 3-5 seconds or for as long as possible. Allow the piston or ball to fall to the bottom of the column. 7. Remove the mouthpiece from your mouth and breathe out normally. 8. Rest for a few seconds and repeat Steps 1 through 7 at least 10 times every 1-2 hours when you are awake. Take your time and take a few normal breaths between deep breaths. 9. The spirometer may include an indicator to show your best effort. Use the indicator as a goal to work toward during each repetition. 10. After each set of 10 deep breaths, practice coughing to be sure your lungs are clear. If you have an incision (the cut made at the time of surgery), support your incision when coughing by placing a pillow or rolled up towels firmly against it. Once you are able to get out of bed, walk around indoors and cough well. You may stop using the incentive spirometer when instructed by your caregiver.  RISKS AND COMPLICATIONS  Take your time so you do not get  dizzy or light-headed.  If you are in pain, you may need to take or ask for pain medication before doing incentive spirometry. It is harder to take a deep breath if you are having pain. AFTER USE  Rest and breathe slowly and easily.  It can be helpful to  keep track of a log of your progress. Your caregiver can provide you with a simple table to help with this. If you are using the spirometer at home, follow these instructions: [name] IF:   You are having difficultly using the spirometer.  You have trouble using the spirometer as often as instructed.  Your pain medication is not giving enough relief while using the spirometer.  You develop fever of 100.5 F (38.1 C) or higher. SEEK IMMEDIATE MEDICAL CARE IF:   You cough up bloody sputum that had not been present before.  You develop fever of 102 F (38.9 C) or greater.  You develop worsening pain at or near the incision site. MAKE SURE YOU:   Understand these instructions.  Will watch your condition.  Will get help right away if you are not doing well or get worse. Document Released: 08/29/2006 Document Revised: 07/11/2011 Document Reviewed: 10/30/2006 ExitCare Patient Information 2014 ExitCare, [place].   ________________________________________________________________________  WHAT IS A BLOOD TRANSFUSION? Blood Transfusion Information  A transfusion is the replacement of blood or some of its parts. Blood is made up of multiple cells which provide different functions.  Red blood cells carry oxygen and are used for blood loss replacement.  White blood cells fight against infection.  Platelets control bleeding.  Plasma helps clot blood.  Other blood products are available for specialized needs, such as hemophilia or other clotting disorders. BEFORE THE TRANSFUSION  Who gives blood for transfusions?   Healthy volunteers who are fully evaluated to make sure their blood is safe. This is blood bank blood. Transfusion therapy is the safest it has ever been in the practice of medicine. Before blood is taken from a donor, a complete history is taken to make sure that person has no history of diseases nor engages in risky social behavior (examples are intravenous drug  use or sexual activity with multiple partners). The donor's travel history is screened to minimize risk of transmitting infections, such as malaria. The donated blood is tested for signs of infectious diseases, such as HIV and hepatitis. The blood is then tested to be sure it is compatible with you in order to minimize the chance of a transfusion reaction. If you or a relative donates blood, this is often done in anticipation of surgery and is not appropriate for emergency situations. It takes many days to process the donated blood. RISKS AND COMPLICATIONS Although transfusion therapy is very safe and saves many lives, the main dangers of transfusion include:   Getting an infectious disease.  Developing a transfusion reaction. This is an allergic reaction to something in the blood you were given. Every precaution is taken to prevent this. The decision to have a blood transfusion has been considered carefully by your caregiver before blood is given. Blood is not given unless the benefits outweigh the risks. AFTER THE TRANSFUSION  Right after receiving a blood transfusion, you will usually feel much better and more energetic. This is especially true if your red blood cells have gotten low (anemic). The transfusion raises the level of the red blood cells which carry oxygen, and this usually causes an energy increase.  The nurse administering the transfusion will  monitor you carefully for complications. HOME CARE INSTRUCTIONS  No special instructions are needed after a transfusion. You may find your energy is better. Speak with your caregiver about any limitations on activity for underlying diseases you may have. SEEK MEDICAL CARE IF:   Your condition is not improving after your transfusion.  You develop redness or irritation at the intravenous (IV) site. SEEK IMMEDIATE MEDICAL CARE IF:  Any of the following symptoms occur over the next 12 hours:  Shaking chills.  You have a temperature by mouth  above 102 F (38.9 C), not controlled by medicine.  Chest, back, or muscle pain.  People around you feel you are not acting correctly or are confused.  Shortness of breath or difficulty breathing.  Dizziness and fainting.  You get a rash or develop hives.  You have a decrease in urine output.  Your urine turns a dark color or changes to pink, red, or brown. Any of the following symptoms occur over the next 10 days:  You have a temperature by mouth above 102 F (38.9 C), not controlled by medicine.  Shortness of breath.  Weakness after normal activity.  The white part of the eye turns yellow (jaundice).  You have a decrease in the amount of urine or are urinating less often.  Your urine turns a dark color or changes to pink, red, or brown. Document Released: 04/15/2000 Document Revised: 07/11/2011 Document Reviewed: 12/03/2007 [facility] Patient Information 2014 [place], [place].  _______________________________________________________________________

## 2016-11-16 ENCOUNTER — Encounter (HOSPITAL_COMMUNITY): Payer: Self-pay

## 2016-11-16 ENCOUNTER — Encounter (HOSPITAL_COMMUNITY)
Admission: RE | Admit: 2016-11-16 | Discharge: 2016-11-16 | Disposition: A | Discharge status: Home / Self Care | Payer: 59 | Source: Ambulatory Visit | Attending: Orthopedic Surgery | Admitting: Orthopedic Surgery

## 2016-11-16 DIAGNOSIS — Z01812 Encounter for preprocedural laboratory examination: Secondary | ICD-10-CM | POA: Diagnosis not present

## 2016-11-16 DIAGNOSIS — Z96641 Presence of right artificial hip joint: Secondary | ICD-10-CM | POA: Diagnosis not present

## 2016-11-16 DIAGNOSIS — Z0183 Encounter for blood typing: Secondary | ICD-10-CM | POA: Diagnosis not present

## 2016-11-16 LAB — COMPREHENSIVE METABOLIC PANEL
ALBUMIN: 4.3 g/dL (ref 3.5–5.0)
ALT: 23 U/L (ref 17–63)
AST: 22 U/L (ref 15–41)
Alkaline Phosphatase: 80 U/L (ref 38–126)
Anion gap: 8 (ref 5–15)
BUN: 28 mg/dL — AB (ref 6–20)
CHLORIDE: 106 mmol/L (ref 101–111)
CO2: 24 mmol/L (ref 22–32)
CREATININE: 1.08 mg/dL (ref 0.61–1.24)
Calcium: 9 mg/dL (ref 8.9–10.3)
GFR calc Af Amer: >60 mL/min (ref >60)
GLUCOSE: 93 mg/dL (ref 65–99)
Potassium: 4.4 mmol/L (ref 3.5–5.1)
Sodium: 138 mmol/L (ref 135–145)
Total Bilirubin: 0.6 mg/dL (ref 0.3–1.2)
Total Protein: 7.4 g/dL (ref 6.5–8.1)

## 2016-11-16 LAB — CBC
HEMATOCRIT: 43.6 % (ref 39.0–52.0)
Hemoglobin: 14.6 g/dL (ref 13.0–17.0)
MCH: 30.5 pg (ref 26.0–34.0)
MCHC: 33.5 g/dL (ref 30.0–36.0)
MCV: 91.2 fL (ref 78.0–100.0)
PLATELETS: 136 10*3/uL — AB (ref 150–400)
RBC: 4.78 MIL/uL (ref 4.22–5.81)
RDW: 13.7 % (ref 11.5–15.5)
WBC: 5.1 10*3/uL (ref 4.0–10.5)

## 2016-11-16 LAB — ABO/RH: ABO/RH(D): O POS

## 2016-11-16 LAB — SURGICAL PCR SCREEN
MRSA, PCR: NEGATIVE
STAPHYLOCOCCUS AUREUS: NEGATIVE

## 2016-11-16 LAB — PROTIME-INR
INR: 0.99
PROTHROMBIN TIME: 13.1 s (ref 11.4–15.2)

## 2016-11-16 LAB — APTT: APTT: 29 s (ref 24–36)

## 2016-11-16 NOTE — Progress Notes (Signed)
CMP routed via epic to Dr Aluisio 

## 2016-11-17 LAB — HEMOGLOBIN A1C
Hgb A1c MFr Bld: 5.1 % (ref 4.8–5.6)
Mean Plasma Glucose: 100 mg/dL

## 2016-11-18 ENCOUNTER — Other Ambulatory Visit: Payer: Self-pay | Admitting: *Deleted

## 2016-11-18 NOTE — Patient Outreach (Signed)
[facility] [facility]) Care Management  11/18/2016  AMON COSTILLA Feb 15, 1966 [id number]  Subjective: Telephone call to patient's home / mobile number, spoke with patient, states he is not comfortable with stating address, and date of birth.   Advised information needed to verify HIPAA and call back information given to caller.  Call disconnected by patient.   Objective: Per chart review, patient scheduled to be admitted for Right hip bearing surface versus total hip arthroplasty revision on 11/23/16.     Assessment: Received UMR Preoperative Call follow up referral on 11/11/16.   Preoperative call follow up pending patient contact.    Plan: RNCM will call patient for 2nd telephone outreach attempt, preoperative call follow up, within 10 business days if no return call.    Toby Ayad H. Annia Friendly, BSN, [place] Management [facility] Telephonic CM Phone: [phone] Fax: [phone]

## 2016-11-20 ENCOUNTER — Ambulatory Visit: Payer: Self-pay | Admitting: Orthopedic Surgery

## 2016-11-20 NOTE — H&P (Signed)
[name] DOB: 22-Jun-1965 Married / Language: English / Race: White Male Date of Admission:  11/23/2016 CC:  Right hip pain History of Present Illness The patient is a 51 year old male who comes in for a preoperative History and Physical. The patient is scheduled for a right hip bearing surface exhcnage versus total hip revision to be performed by Dr. Dione Plover. Aluisio, MD at [facility] on 11/23/2016. The patient is a 51 year old male who presented for follow up of their hip. The patient is being followed for their bilateral hip pain. They are 12 year(s) out from total hip surgery (left THA March 2006; right THA June 2006). Symptoms reported include: pain (right hip tightness/stiffness. pain getting in/out of car. Pt. can't tie his shoe on the right side). The patient feels that they are doing poorly and report their pain level to be moderate (lateral side pain). The following medication has been used for pain control: antiinflammatory medication (Enbrel injection) and Tylenol (not helping). The patient has reported improvement of their symptoms with: physical therapy. [name] has a history of bilateral hip replacements, which are metal on metal. He had this done in 2006. He has had no problems with the left hip. On the right hip, he has had some ongoing trouble. He notes pain on the lateral aspect of his hip as well as in the groin, especially when he is doing rotational movements such as putting shoes and socks on and getting in and out of a car. He does have some discomfort lying on his hips at night. Does feel some tenderness along the lateral aspect, has not noticed any swelling, has not noticed any complications of the incision. No fevers or chills, but does feel overall fatigue. In the past, he has had some elevated cobalt and chromium levels. He did get labs redrawn. Patient had repeat Chromium level which was 2.0 and Cobalt level was elevated at 7.9.  They have been treated  conservatively in the past for the above stated problem and despite conservative measures, they continue to have progressive pain and severe functional limitations and dysfunction. They have failed non-operative management including home exercise, medications, and formal therpay. It is felt that they would benefit from undergoing revisoin of the total joint replacement. Risks and benefits of the procedure have been discussed with the patient and they elect to proceed with surgery. There are no active contraindications to surgery such as ongoing infection or rapidly progressive neurological disease.   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 Osteoarthritis  Mother. Osteoporosis  Mother. Cerebrovascular Accident  Mother. Heart Disease  Mother. Hypertension  Mother. Kidney disease  Mother.  Social History Tobacco / smoke exposure  11/27/2013: no Living situation  live with spouse Tobacco use  Never smoker. 11/27/2013 Current drinker  11/27/2013: Currently drinks beer only occasionally per week Children  1 Exercise  Exercises rarely; does running / walking Not under pain contract  No history of drug/alcohol rehab  Number of flights of stairs before winded  2-3 Current work status  disabled Marital status  married  Medication History  Enbrel (50MG /ML Soln Pref Syr, Subcutaneous) Active.  Tylenol Extra Strength (500MG  Tablet, Oral) Active. (when needed) Aleve (220MG  Tablet, Oral) Active. (or Ibuprofen when needed) Vitamin d Active.  Past Surgical History Total Hip  Replacement  bilateral   Review of Systems General Present- Shakiness and Tiredness. Not Present- Chills, Fatigue, Fever, Memory Loss, Night Sweats, Weight Gain and Weight Loss. Skin Present- Psoriasis. Not Present- Eczema, Hives, Itching, Lesions and Rash. HEENT Not Present- Dentures, Double Vision, Headache, Hearing Loss, Tinnitus and Visual Loss. Neck Present- Neck Stiffness. Respiratory Not Present- Allergies, Chronic Cough, Coughing up blood, Shortness of breath at rest and Shortness of breath with exertion. Cardiovascular Not Present- Chest Pain, Difficulty Breathing Lying Down, Murmur, Palpitations, Racing/skipping heartbeats and Swelling. Gastrointestinal Not Present- Abdominal Pain, Bloody Stool, Constipation, Diarrhea, Difficulty Swallowing, Heartburn, Jaundice, Loss of appetitie, Nausea and Vomiting. Male Genitourinary Not Present- Blood in Urine, Discharge, Flank Pain, Incontinence, Painful Urination, Urgency, Urinary frequency, Urinary Retention, Urinating at Night and Weak urinary stream. Musculoskeletal Present- Back Pain, Decreased Range of Motion, Joint Stiffness, Morning Stiffness, Muscle Weakness and Swelling of Extremities. Not Present- Joint Pain, Joint Swelling, Muscle Pain and Spasms. Neurological Not Present- Blackout spells, Difficulty with balance, Dizziness, Paralysis, Tremor and Weakness. Psychiatric Not Present- Insomnia.  Vitals  Weight: 245 lb Height: 69in Body Surface Area: 2.25 m Body Mass Index: 36.18 kg/m  Pulse: 68 (Regular)  BP: 128/70 (Sitting, Right Arm, Standard)   Physical Exam General Mental Status -Alert, cooperative and good historian. General Appearance-pleasant, Not in acute distress. Orientation-Oriented X3. Build & Nutrition-Well nourished and Well developed.  Head and Neck Head-normocephalic, atraumatic . Neck Global Assessment - supple, no bruit auscultated on the right, no bruit auscultated on the  left.  Eye Pupil - Bilateral-Regular and Round. Motion - Bilateral-EOMI.  Chest and Lung Exam Auscultation Breath sounds - clear at anterior chest wall and clear at posterior chest wall. Adventitious sounds - No Adventitious sounds.  Cardiovascular Auscultation Rhythm - Regular rate and rhythm. Heart Sounds - S1 WNL and S2 WNL. Murmurs & Other Heart Sounds - Auscultation of the heart reveals - No Murmurs.  Abdomen Palpation/Percussion Tenderness - Abdomen is non-tender to palpation. Rigidity (guarding) - Abdomen is soft. Auscultation Auscultation of the abdomen reveals - Bowel sounds normal.  Male Genitourinary Note: Not done, not pertinent to present illness   Musculoskeletal Note: On physical exam, he is alert and oriented. He is in no acute distress. The left hip, he does have normal painless range of motion. Incision is healed. No signs of infection. The right hip, he does have some discomfort with passive and active range of motion, but no deficit range of motion is appreciated. No swelling noted over the hips. Calves are soft and nontender. Distal pulses 2+. Sensation and motor function intact to lower extremities.  IMAGING AP and lateral views of the hip show no signs of loosening of the prosthesis. No periprosthetic fractures. Prosthesis does look to be in good position.  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 [name]  IV TXA  Patient was instructed on what medications to stop prior to surgery.  Signed electronically by Joelene Millin, III PA-C

## 2016-11-21 ENCOUNTER — Other Ambulatory Visit: Payer: Self-pay | Admitting: *Deleted

## 2016-11-21 NOTE — Patient Outreach (Addendum)
[place] [facility]) Care Management  11/21/2016  ERRON WENGERT [birth year] [id number]   Subjective: Telephone call to patient's home / mobile number, spoke with male answering the phone, asked to speak with Mr. Schumm, call disconnected by call receipent, and unable to leave a message.    Objective: Per chart review, patient scheduled to be admitted for Right hip bearing surface versus total hip arthroplasty revision on 11/23/16.     Assessment: Received UMR Preoperative Call follow up referral on 11/11/16.   Preoperative call follow up pending patient contact.    Plan: RNCM will call patient for 3rd telephone outreach attempt, preoperative call follow up, within 10 business days if no return call.    Lailany Enoch H. Annia Friendly, BSN, [place] Management [facility] Telephonic CM Phone: [phone] Fax: [phone]

## 2016-11-22 ENCOUNTER — Other Ambulatory Visit: Payer: Self-pay | Admitting: *Deleted

## 2016-11-22 NOTE — Patient Outreach (Signed)
[facility]) Care Management  11/22/2016  [name] 06/10/1965 [id number]  Subjective: Telephone call to patient's home / mobile number, spoke with male answering the phone, asked to speak with [name], states he is hearing echo on the phone, and request that caller, calls him back on a different line. Telephone call to patient's home / mobile number, spoke with patient, and HIPAA verified.  Discussed [facility] Care Management UMR Transition of care follow up, preoperative call follow up, patient voiced understanding, and is in agreement to both types of follow up.   Patient states he is doing well, ready for surgery tomorrow at [facility]. Patient voices understanding of medical diagnosis and treatment plan.  States he is accessing the following [facility] benefits: outpatient pharmacy, hospital indemnity (did not choose this benefit), and wife has  family medical leave act (FMLA) in place to be off with him during recovery.   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  Patient states he does not have any preoperative questions, care coordination, disease management, disease monitoring, transportation, community resource, or pharmacy needs at this time.  States he is very appreciative of the follow up and is in agreement to receive [place] Management information post transition of care follow up.    Objective:Per chart review, patient scheduled to be admitted for Right hip bearing surface versus total hip arthroplasty revision on 11/23/16.    Assessment:Received UMR Preoperative Call follow up referral on 11/11/16. Preoperative call completed, and transition of care follow up pending notification of patient discharge.  Plan: RNCM will call patient for  telephone outreach attempt, transition of care follow up, within 3 business days of hospital discharge notification.    Escarlet Saathoff H. Annia Friendly, BSN, [place] Management [facility] Telephonic  CM Phone: [phone] Fax: [phone]

## 2016-11-23 ENCOUNTER — Encounter (HOSPITAL_COMMUNITY)
Admission: RE | Disposition: A | Discharge status: Home / Self Care | Payer: Self-pay | Source: Ambulatory Visit | Attending: Orthopedic Surgery

## 2016-11-23 ENCOUNTER — Encounter (HOSPITAL_COMMUNITY): Payer: Self-pay | Admitting: *Deleted

## 2016-11-23 ENCOUNTER — Inpatient Hospital Stay (HOSPITAL_COMMUNITY): Payer: 59 | Admitting: Registered Nurse

## 2016-11-23 ENCOUNTER — Inpatient Hospital Stay (HOSPITAL_COMMUNITY)
Admission: RE | Admit: 2016-11-23 | Discharge: 2016-11-24 | DRG: 468 | Disposition: A | Discharge status: Home / Self Care | Payer: 59 | Source: Ambulatory Visit | Attending: Orthopedic Surgery | Admitting: Orthopedic Surgery

## 2016-11-23 ENCOUNTER — Inpatient Hospital Stay (HOSPITAL_COMMUNITY): Payer: 59

## 2016-11-23 DIAGNOSIS — T84018A Broken internal joint prosthesis, other site, initial encounter: Secondary | ICD-10-CM

## 2016-11-23 DIAGNOSIS — Z6836 Body mass index (BMI) 36.0-36.9, adult: Secondary | ICD-10-CM

## 2016-11-23 DIAGNOSIS — Z825 Family history of asthma and other chronic lower respiratory diseases: Secondary | ICD-10-CM

## 2016-11-23 DIAGNOSIS — Z96642 Presence of left artificial hip joint: Secondary | ICD-10-CM | POA: Diagnosis present

## 2016-11-23 DIAGNOSIS — E785 Hyperlipidemia, unspecified: Secondary | ICD-10-CM | POA: Diagnosis not present

## 2016-11-23 DIAGNOSIS — G8929 Other chronic pain: Secondary | ICD-10-CM | POA: Diagnosis present

## 2016-11-23 DIAGNOSIS — T8489XA Other specified complication of internal orthopedic prosthetic devices, implants and grafts, initial encounter: Secondary | ICD-10-CM | POA: Diagnosis not present

## 2016-11-23 DIAGNOSIS — Z471 Aftercare following joint replacement surgery: Secondary | ICD-10-CM | POA: Diagnosis not present

## 2016-11-23 DIAGNOSIS — L4050 Arthropathic psoriasis, unspecified: Secondary | ICD-10-CM | POA: Diagnosis present

## 2016-11-23 DIAGNOSIS — Z8701 Personal history of pneumonia (recurrent): Secondary | ICD-10-CM | POA: Diagnosis not present

## 2016-11-23 DIAGNOSIS — M5441 Lumbago with sciatica, right side: Secondary | ICD-10-CM | POA: Diagnosis present

## 2016-11-23 DIAGNOSIS — T84091A Other mechanical complication of internal left hip prosthesis, initial encounter: Secondary | ICD-10-CM | POA: Diagnosis not present

## 2016-11-23 DIAGNOSIS — M87059 Idiopathic aseptic necrosis of unspecified femur: Secondary | ICD-10-CM | POA: Diagnosis not present

## 2016-11-23 DIAGNOSIS — R7303 Prediabetes: Secondary | ICD-10-CM

## 2016-11-23 DIAGNOSIS — Z96649 Presence of unspecified artificial hip joint: Secondary | ICD-10-CM

## 2016-11-23 DIAGNOSIS — T84090A Other mechanical complication of internal right hip prosthesis, initial encounter: Principal | ICD-10-CM | POA: Diagnosis present

## 2016-11-23 DIAGNOSIS — Z96641 Presence of right artificial hip joint: Secondary | ICD-10-CM | POA: Diagnosis not present

## 2016-11-23 DIAGNOSIS — M25551 Pain in right hip: Secondary | ICD-10-CM | POA: Diagnosis present

## 2016-11-23 DIAGNOSIS — Z79899 Other long term (current) drug therapy: Secondary | ICD-10-CM

## 2016-11-23 HISTORY — PX: TOTAL HIP REVISION: SHX763

## 2016-11-23 LAB — TYPE AND SCREEN
ABO/RH(D): O POS
ANTIBODY SCREEN: NEGATIVE

## 2016-11-23 SURGERY — TOTAL HIP REVISION
Anesthesia: Spinal | Site: Hip | Laterality: Right

## 2016-11-23 MED ORDER — TRANEXAMIC ACID 1000 MG/10ML IV SOLN
1000.0000 mg | INTRAVENOUS | Status: AC
  Administered 2016-11-23: 1000 mg via INTRAVENOUS
  Filled 2016-11-23: qty 10

## 2016-11-23 MED ORDER — CEFAZOLIN SODIUM-DEXTROSE 2-4 GM/100ML-% IV SOLN
INTRAVENOUS | Status: AC
  Filled 2016-11-23: qty 100

## 2016-11-23 MED ORDER — PROPOFOL 10 MG/ML IV BOLUS
INTRAVENOUS | Status: AC
  Filled 2016-11-23: qty 20

## 2016-11-23 MED ORDER — METOCLOPRAMIDE HCL 5 MG PO TABS: 5.0000 mg | ORAL_TABLET | Freq: Three times a day (TID) | ORAL | Status: DC | PRN

## 2016-11-23 MED ORDER — BISACODYL 10 MG RE SUPP: 10.0000 mg | Freq: Every day | RECTAL | Status: DC | PRN

## 2016-11-23 MED ORDER — METHOCARBAMOL 500 MG PO TABS
500.0000 mg | ORAL_TABLET | Freq: Four times a day (QID) | ORAL | Status: DC | PRN
  Administered 2016-11-23 – 2016-11-24 (×3): 500 mg via ORAL
  Filled 2016-11-23 (×3): qty 1

## 2016-11-23 MED ORDER — PROPOFOL 500 MG/50ML IV EMUL
INTRAVENOUS | Status: DC | PRN
  Administered 2016-11-23: 50 ug/kg/min via INTRAVENOUS

## 2016-11-23 MED ORDER — ONDANSETRON HCL 4 MG/2ML IJ SOLN: 4.0000 mg | Freq: Four times a day (QID) | INTRAMUSCULAR | Status: DC | PRN

## 2016-11-23 MED ORDER — SODIUM CHLORIDE 0.9 % IR SOLN
Status: DC | PRN
  Administered 2016-11-23: 1000 mL

## 2016-11-23 MED ORDER — MORPHINE SULFATE (PF) 4 MG/ML IV SOLN: 1.0000 mg | INTRAVENOUS | Status: DC | PRN

## 2016-11-23 MED ORDER — MENTHOL 3 MG MT LOZG: 1.0000 | LOZENGE | OROMUCOSAL | Status: DC | PRN

## 2016-11-23 MED ORDER — CEFAZOLIN SODIUM-DEXTROSE 2-4 GM/100ML-% IV SOLN
2.0000 g | INTRAVENOUS | Status: AC
  Administered 2016-11-23: 2 g via INTRAVENOUS

## 2016-11-23 MED ORDER — ACETAMINOPHEN 650 MG RE SUPP: 650.0000 mg | Freq: Four times a day (QID) | RECTAL | Status: DC | PRN

## 2016-11-23 MED ORDER — LIDOCAINE 2% (20 MG/ML) 5 ML SYRINGE
INTRAMUSCULAR | Status: DC | PRN
  Administered 2016-11-23: 100 mg via INTRAVENOUS

## 2016-11-23 MED ORDER — FENTANYL CITRATE (PF) 100 MCG/2ML IJ SOLN
INTRAMUSCULAR | Status: DC | PRN
  Administered 2016-11-23: 50 ug via INTRAVENOUS

## 2016-11-23 MED ORDER — POLYETHYLENE GLYCOL 3350 17 G PO PACK: 17.0000 g | PACK | Freq: Every day | ORAL | Status: DC | PRN

## 2016-11-23 MED ORDER — LACTATED RINGERS IV SOLN
INTRAVENOUS | Status: DC
  Administered 2016-11-23: 09:00:00 via INTRAVENOUS

## 2016-11-23 MED ORDER — MEPERIDINE HCL 50 MG/ML IJ SOLN: 6.2500 mg | INTRAMUSCULAR | Status: DC | PRN

## 2016-11-23 MED ORDER — PROMETHAZINE HCL 25 MG/ML IJ SOLN: 6.2500 mg | INTRAMUSCULAR | Status: DC | PRN

## 2016-11-23 MED ORDER — METHOCARBAMOL 1000 MG/10ML IJ SOLN
500.0000 mg | Freq: Four times a day (QID) | INTRAVENOUS | Status: DC | PRN
  Filled 2016-11-23: qty 5

## 2016-11-23 MED ORDER — TRANEXAMIC ACID 1000 MG/10ML IV SOLN
1000.0000 mg | Freq: Once | INTRAVENOUS | Status: AC
  Administered 2016-11-23: 1000 mg via INTRAVENOUS
  Filled 2016-11-23: qty 10

## 2016-11-23 MED ORDER — EPHEDRINE 5 MG/ML INJ
INTRAVENOUS | Status: AC
  Filled 2016-11-23: qty 10

## 2016-11-23 MED ORDER — MIDAZOLAM HCL 5 MG/5ML IJ SOLN
INTRAMUSCULAR | Status: DC | PRN
  Administered 2016-11-23: 2 mg via INTRAVENOUS

## 2016-11-23 MED ORDER — PHENOL 1.4 % MT LIQD: 1.0000 | OROMUCOSAL | Status: DC | PRN

## 2016-11-23 MED ORDER — DEXAMETHASONE SODIUM PHOSPHATE 10 MG/ML IJ SOLN
INTRAMUSCULAR | Status: AC
  Filled 2016-11-23: qty 1

## 2016-11-23 MED ORDER — EPHEDRINE SULFATE-NACL 50-0.9 MG/10ML-% IV SOSY
PREFILLED_SYRINGE | INTRAVENOUS | Status: DC | PRN
  Administered 2016-11-23: 10 mg via INTRAVENOUS
  Administered 2016-11-23 (×4): 5 mg via INTRAVENOUS

## 2016-11-23 MED ORDER — ONDANSETRON HCL 4 MG/2ML IJ SOLN
INTRAMUSCULAR | Status: AC
  Filled 2016-11-23: qty 2

## 2016-11-23 MED ORDER — TRAMADOL HCL 50 MG PO TABS: 50.0000 mg | ORAL_TABLET | Freq: Four times a day (QID) | ORAL | Status: DC | PRN

## 2016-11-23 MED ORDER — DEXAMETHASONE SODIUM PHOSPHATE 10 MG/ML IJ SOLN
10.0000 mg | Freq: Once | INTRAMUSCULAR | Status: AC
  Administered 2016-11-24: 10 mg via INTRAVENOUS
  Filled 2016-11-23: qty 1

## 2016-11-23 MED ORDER — ONDANSETRON HCL 4 MG PO TABS: 4.0000 mg | ORAL_TABLET | Freq: Four times a day (QID) | ORAL | Status: DC | PRN

## 2016-11-23 MED ORDER — ACETAMINOPHEN 10 MG/ML IV SOLN
INTRAVENOUS | Status: AC
  Filled 2016-11-23: qty 100

## 2016-11-23 MED ORDER — HYDROMORPHONE HCL-NACL 0.5-0.9 MG/ML-% IV SOSY: 0.2500 mg | PREFILLED_SYRINGE | INTRAVENOUS | Status: DC | PRN

## 2016-11-23 MED ORDER — ACETAMINOPHEN 10 MG/ML IV SOLN
1000.0000 mg | Freq: Once | INTRAVENOUS | Status: AC
  Administered 2016-11-23: 1000 mg via INTRAVENOUS

## 2016-11-23 MED ORDER — BUPIVACAINE HCL (PF) 0.25 % IJ SOLN
INTRAMUSCULAR | Status: AC
  Filled 2016-11-23: qty 30

## 2016-11-23 MED ORDER — DEXAMETHASONE SODIUM PHOSPHATE 10 MG/ML IJ SOLN
10.0000 mg | Freq: Once | INTRAMUSCULAR | Status: AC
  Administered 2016-11-23: 10 mg via INTRAVENOUS

## 2016-11-23 MED ORDER — RIVAROXABAN 10 MG PO TABS
10.0000 mg | ORAL_TABLET | Freq: Every day | ORAL | Status: DC
  Administered 2016-11-24: 10 mg via ORAL
  Filled 2016-11-23: qty 1

## 2016-11-23 MED ORDER — BUPIVACAINE IN DEXTROSE 0.75-8.25 % IT SOLN
INTRATHECAL | Status: DC | PRN
  Administered 2016-11-23: 2 mL via INTRATHECAL

## 2016-11-23 MED ORDER — ACETAMINOPHEN 500 MG PO TABS
1000.0000 mg | ORAL_TABLET | Freq: Four times a day (QID) | ORAL | Status: AC
  Administered 2016-11-23 – 2016-11-24 (×4): 1000 mg via ORAL
  Filled 2016-11-23 (×4): qty 2

## 2016-11-23 MED ORDER — BUPIVACAINE HCL 0.25 % IJ SOLN
INTRAMUSCULAR | Status: DC | PRN
  Administered 2016-11-23: 30 mL

## 2016-11-23 MED ORDER — MIDAZOLAM HCL 2 MG/2ML IJ SOLN
INTRAMUSCULAR | Status: AC
  Filled 2016-11-23: qty 2

## 2016-11-23 MED ORDER — LIDOCAINE 2% (20 MG/ML) 5 ML SYRINGE
INTRAMUSCULAR | Status: AC
  Filled 2016-11-23: qty 5

## 2016-11-23 MED ORDER — DIPHENHYDRAMINE HCL 12.5 MG/5ML PO ELIX: 12.5000 mg | ORAL_SOLUTION | ORAL | Status: DC | PRN

## 2016-11-23 MED ORDER — ACETAMINOPHEN 325 MG PO TABS: 650.0000 mg | ORAL_TABLET | Freq: Four times a day (QID) | ORAL | Status: DC | PRN

## 2016-11-23 MED ORDER — CHLORHEXIDINE GLUCONATE 4 % EX LIQD: 60.0000 mL | Freq: Once | CUTANEOUS | Status: DC

## 2016-11-23 MED ORDER — SODIUM CHLORIDE 0.9 % IV SOLN: INTRAVENOUS | Status: DC

## 2016-11-23 MED ORDER — ONDANSETRON HCL 4 MG/2ML IJ SOLN
INTRAMUSCULAR | Status: DC | PRN
  Administered 2016-11-23: 4 mg via INTRAVENOUS

## 2016-11-23 MED ORDER — FLEET ENEMA 7-19 GM/118ML RE ENEM: 1.0000 | ENEMA | Freq: Once | RECTAL | Status: DC | PRN

## 2016-11-23 MED ORDER — CEFAZOLIN SODIUM-DEXTROSE 2-4 GM/100ML-% IV SOLN
2.0000 g | Freq: Four times a day (QID) | INTRAVENOUS | Status: AC
  Administered 2016-11-23 (×2): 2 g via INTRAVENOUS
  Filled 2016-11-23 (×2): qty 100

## 2016-11-23 MED ORDER — OXYCODONE HCL 5 MG PO TABS
5.0000 mg | ORAL_TABLET | ORAL | Status: DC | PRN
  Administered 2016-11-23 (×2): 10 mg via ORAL
  Administered 2016-11-23: 5 mg via ORAL
  Administered 2016-11-24: 10 mg via ORAL
  Administered 2016-11-24 (×2): 5 mg via ORAL
  Filled 2016-11-23 (×3): qty 2
  Filled 2016-11-23: qty 1
  Filled 2016-11-23 (×2): qty 2

## 2016-11-23 MED ORDER — FENTANYL CITRATE (PF) 100 MCG/2ML IJ SOLN
INTRAMUSCULAR | Status: AC
  Filled 2016-11-23: qty 2

## 2016-11-23 MED ORDER — MIDAZOLAM HCL 2 MG/2ML IJ SOLN: 0.5000 mg | Freq: Once | INTRAMUSCULAR | Status: DC | PRN

## 2016-11-23 MED ORDER — DOCUSATE SODIUM 100 MG PO CAPS
100.0000 mg | ORAL_CAPSULE | Freq: Two times a day (BID) | ORAL | Status: DC
  Administered 2016-11-23 – 2016-11-24 (×2): 100 mg via ORAL
  Filled 2016-11-23 (×3): qty 1

## 2016-11-23 MED ORDER — METOCLOPRAMIDE HCL 5 MG/ML IJ SOLN: 5.0000 mg | Freq: Three times a day (TID) | INTRAMUSCULAR | Status: DC | PRN

## 2016-11-23 SURGICAL SUPPLY — 69 items
BAG DECANTER FOR FLEXI CONT (MISCELLANEOUS) ×2 IMPLANT
BAG SPEC THK2 15X12 ZIP CLS (MISCELLANEOUS) ×3
BAG ZIPLOCK 12X15 (MISCELLANEOUS) ×6 IMPLANT
BIT DRILL 2.8X128 (BIT) ×2 IMPLANT
BLADE EXTENDED COATED 6.5IN (ELECTRODE) ×2 IMPLANT
BLADE SAW SAG 73X25 THK (BLADE) ×1
BLADE SAW SGTL 73X25 THK (BLADE) ×1 IMPLANT
BRUSH FEMORAL CANAL (MISCELLANEOUS) IMPLANT
COVER SURGICAL LIGHT HANDLE (MISCELLANEOUS) ×2 IMPLANT
DRAPE INCISE IOBAN 66X45 STRL (DRAPES) ×2 IMPLANT
DRAPE ORTHO SPLIT 77X108 STRL (DRAPES) ×4
DRAPE POUCH INSTRU U-SHP 10X18 (DRAPES) ×2 IMPLANT
DRAPE SURG ORHT 6 SPLT 77X108 (DRAPES) ×2 IMPLANT
DRAPE U-SHAPE 47X51 STRL (DRAPES) ×2 IMPLANT
DRSG ADAPTIC 3X8 NADH LF (GAUZE/BANDAGES/DRESSINGS) ×1 IMPLANT
DRSG EMULSION OIL 3X16 NADH (GAUZE/BANDAGES/DRESSINGS) ×2 IMPLANT
DRSG MEPILEX BORDER 4X12 (GAUZE/BANDAGES/DRESSINGS) ×1 IMPLANT
DRSG MEPILEX BORDER 4X4 (GAUZE/BANDAGES/DRESSINGS) ×4 IMPLANT
DRSG MEPILEX BORDER 4X8 (GAUZE/BANDAGES/DRESSINGS) ×2 IMPLANT
DURAPREP 26ML APPLICATOR (WOUND CARE) ×2 IMPLANT
ELECT REM PT RETURN 15FT ADLT (MISCELLANEOUS) ×2 IMPLANT
EVACUATOR 1/8 PVC DRAIN (DRAIN) ×2 IMPLANT
FACESHIELD WRAPAROUND (MASK) ×8 IMPLANT
FACESHIELD WRAPAROUND OR TEAM (MASK) ×4 IMPLANT
GAUZE SPONGE 4X4 12PLY STRL (GAUZE/BANDAGES/DRESSINGS) ×2 IMPLANT
GLOVE BIO SURGEON STRL SZ7.5 (GLOVE) ×2 IMPLANT
GLOVE BIO SURGEON STRL SZ8 (GLOVE) ×2 IMPLANT
GLOVE BIOGEL PI IND STRL 8 (GLOVE) ×3 IMPLANT
GLOVE BIOGEL PI INDICATOR 8 (GLOVE) ×3
GLOVE SURG SS PI 6.5 STRL IVOR (GLOVE) ×4 IMPLANT
GOWN STRL REUS W/TWL LRG LVL3 (GOWN DISPOSABLE) ×4 IMPLANT
GOWN STRL REUS W/TWL XL LVL3 (GOWN DISPOSABLE) ×2 IMPLANT
HANDPIECE INTERPULSE COAX TIP (DISPOSABLE)
HEAD M SROM 36MM PLUS (Hips) IMPLANT
IMMOBILIZER KNEE 20 (SOFTGOODS) ×1 IMPLANT
IMMOBILIZER KNEE 20 THIGH 36 (SOFTGOODS) IMPLANT
LINER MARATHON NEUT +4X60X36 (Hips) ×1 IMPLANT
MANIFOLD NEPTUNE II (INSTRUMENTS) ×2 IMPLANT
MARKER SKIN DUAL TIP RULER LAB (MISCELLANEOUS) ×2 IMPLANT
NDL SAFETY ECLIPSE 18X1.5 (NEEDLE) ×1 IMPLANT
NEEDLE HYPO 18GX1.5 SHARP (NEEDLE) ×2
NS IRRIG 1000ML POUR BTL (IV SOLUTION) ×2 IMPLANT
PADDING CAST COTTON 6X4 STRL (CAST SUPPLIES) ×2 IMPLANT
PASSER SUT SWANSON 36MM LOOP (INSTRUMENTS) ×2 IMPLANT
POSITIONER SURGICAL ARM (MISCELLANEOUS) ×2 IMPLANT
PRESSURIZER FEMORAL UNIV (MISCELLANEOUS) IMPLANT
SET HNDPC FAN SPRY TIP SCT (DISPOSABLE) IMPLANT
SPONGE LAP 18X18 X RAY DECT (DISPOSABLE) ×2 IMPLANT
SROM M HEAD 36MM PLUS (Hips) ×2 IMPLANT
STAPLER VISISTAT 35W (STAPLE) ×2 IMPLANT
STRIP CLOSURE SKIN 1/2X4 (GAUZE/BANDAGES/DRESSINGS) ×1 IMPLANT
SUCTION FRAZIER HANDLE 10FR (MISCELLANEOUS) ×1
SUCTION TUBE FRAZIER 10FR DISP (MISCELLANEOUS) ×1 IMPLANT
SUT ETHIBOND NAB CT1 #1 30IN (SUTURE) ×4 IMPLANT
SUT MNCRL AB 4-0 PS2 18 (SUTURE) ×1 IMPLANT
SUT VIC AB 1 CT1 27 (SUTURE) ×6
SUT VIC AB 1 CT1 27XBRD ANTBC (SUTURE) ×3 IMPLANT
SUT VIC AB 2-0 CT1 27 (SUTURE) ×6
SUT VIC AB 2-0 CT1 TAPERPNT 27 (SUTURE) ×3 IMPLANT
SUT VLOC 180 0 24IN GS25 (SUTURE) ×4 IMPLANT
SWAB COLLECTION DEVICE MRSA (MISCELLANEOUS) ×2 IMPLANT
SWAB CULTURE ESWAB REG 1ML (MISCELLANEOUS) IMPLANT
SYR 50ML LL SCALE MARK (SYRINGE) ×2 IMPLANT
TOWEL OR 17X26 10 PK STRL BLUE (TOWEL DISPOSABLE) ×4 IMPLANT
TOWER CARTRIDGE SMART MIX (DISPOSABLE) IMPLANT
TRAY FOLEY W/METER SILVER 16FR (SET/KITS/TRAYS/PACK) ×2 IMPLANT
TUBE KAMVAC SUCTION (TUBING) IMPLANT
WATER STERILE IRR 1500ML POUR (IV SOLUTION) ×2 IMPLANT
YANKAUER SUCT BULB TIP 10FT TU (MISCELLANEOUS) ×2 IMPLANT

## 2016-11-23 NOTE — Interval H&P Note (Signed)
History and Physical Interval Note:  11/23/2016 8:33 AM  [name]  has presented today for surgery, with the diagnosis of Right hip metallosis   The various methods of treatment have been discussed with the patient and family. After consideration of risks, benefits and other options for treatment, the patient has consented to  Procedure(s): Right hip bearing surface versus total hip arthroplasty revision (Right) as a surgical intervention .  The patient's history has been reviewed, patient examined, no change in status, stable for surgery.  I have reviewed the patient's chart and labs.  Questions were answered to the patient's satisfaction.     Gearlean Alf

## 2016-11-23 NOTE — Anesthesia Preprocedure Evaluation (Addendum)
Anesthesia Evaluation  Patient identified by MRN, date of birth, ID band Patient awake    Reviewed: Allergy & Precautions, NPO status , Patient's Chart, lab work & pertinent test results  History of Anesthesia Complications Negative for: history of anesthetic complications  Airway Mallampati: IV  TM Distance: >3 FB Neck ROM: Full  Mouth opening: Limited Mouth Opening  Dental  (+) Dental Advisory Given   Pulmonary neg pulmonary ROS,    breath sounds clear to auscultation       Cardiovascular (-) anginanegative cardio ROS   Rhythm:Regular Rate:Normal     Neuro/Psych DDD    GI/Hepatic negative GI ROS, Neg liver ROS,   Endo/Other  Morbid obesity  Renal/GU negative Renal ROS     Musculoskeletal  (+) Arthritis  (psoriatic arthritis),   Abdominal (+) + obese,   Peds  Hematology negative hematology ROS (+)   Anesthesia Other Findings   Reproductive/Obstetrics                            Anesthesia Physical Anesthesia Plan  ASA: II  Anesthesia Plan: Spinal   Post-op Pain Management:    Induction:   PONV Risk Score and Plan: 1 and Ondansetron and Dexamethasone  Airway Management Planned: Natural Airway and Simple Face Mask  Additional Equipment:   Intra-op Plan:   Post-operative Plan:   Informed Consent: I have reviewed the patients History and Physical, chart, labs and discussed the procedure including the risks, benefits and alternatives for the proposed anesthesia with the patient or authorized representative who has indicated his/her understanding and acceptance.   Dental advisory given  Plan Discussed with: Surgeon and CRNA  Anesthesia Plan Comments: (Plan routine monitors, SAB)        Anesthesia Quick Evaluation

## 2016-11-23 NOTE — Interval H&P Note (Signed)
History and Physical Interval Note:  11/23/2016 8:42 AM  [name]  has presented today for surgery, with the diagnosis of Right hip metallosis   The various methods of treatment have been discussed with the patient and family. After consideration of risks, benefits and other options for treatment, the patient has consented to  Procedure(s): Right hip bearing surface versus total hip arthroplasty revision (Right) as a surgical intervention .  The patient's history has been reviewed, patient examined, no change in status, stable for surgery.  I have reviewed the patient's chart and labs.  Questions were answered to the patient's satisfaction.     Gearlean Alf

## 2016-11-23 NOTE — Discharge Instructions (Addendum)
Dr. [name] Total Joint Specialist [facility] [address] [place], [place] [address] [phone]   POSTERIOR TOTAL HIP REPLACEMENT POSTOPERATIVE DIRECTIONS  Hip Rehabilitation, Guidelines Following Surgery  The results of a hip operation are greatly improved after range of motion and muscle strengthening exercises. Follow all safety measures which are given to protect your hip. If any of these exercises cause increased pain or swelling in your joint, decrease the amount until you are comfortable again. Then slowly increase the exercises. Call your caregiver if you have problems or questions.   HOME CARE INSTRUCTIONS  Remove items at home which could result in a fall. This includes throw rugs or furniture in walking pathways.   ICE to the affected hip every three hours for 30 minutes at a time and then as needed for pain and swelling.  Continue to use ice on the hip for pain and swelling from surgery. You may notice swelling that will progress down to the foot and ankle.  This is normal after surgery.  Elevate the leg when you are not up walking on it.    Continue to use the breathing machine which will help keep your temperature down.  It is common for your temperature to cycle up and down following surgery, especially at night when you are not up moving around and exerting yourself.  The breathing machine keeps your lungs expanded and your temperature down.  DIET You may resume your previous home diet once your are discharged from the hospital.  DRESSING / WOUND CARE / SHOWERING You may shower 3 days after surgery, but keep the wounds dry during showering.  You may use an occlusive plastic wrap (Press'n Seal for example), NO SOAKING/SUBMERGING IN THE BATHTUB.  If the bandage gets wet, change with a clean dry gauze.  If the incision gets wet, pat the wound dry with a clean towel. You may start showering once you are discharged home but do not submerge  the incision under water. Just pat the incision dry and apply a dry gauze dressing on daily. Change the surgical dressing daily and reapply a dry dressing each time.    ACTIVITY Walk with your walker as instructed. Use walker as long as suggested by your caregivers. Avoid periods of inactivity such as sitting longer than an hour when not asleep. This helps prevent blood clots.  You may resume a sexual relationship in one month or when given the OK by your doctor.  You may return to work once you are cleared by your doctor.  Do not drive a car for 6 weeks or until released by you surgeon.  Do not drive while taking narcotics.  WEIGHT BEARING Weight bearing as tolerated with assist device (walker, cane, etc) as directed, use it as long as suggested by your surgeon or therapist, typically at least 4-6 weeks.  POSTOPERATIVE CONSTIPATION PROTOCOL Constipation - defined medically as fewer than three stools per week and severe constipation as less than one stool per week.  One of the most common issues patients have following surgery is constipation.  Even if you have a regular bowel pattern at home, your normal regimen is likely to be disrupted due to multiple reasons following surgery.  Combination of anesthesia, postoperative narcotics, change in appetite and fluid intake all can affect your bowels.  In order to avoid complications following surgery, here are some recommendations in order to help you during your recovery period.  Colace (docusate) - Pick up an over-the-counter  form of Colace or another stool softener and take twice a day as long as you are requiring postoperative pain medications.  Take with a full glass of water daily.  If you experience loose stools or diarrhea, hold the colace until you stool forms back up.  If your symptoms do not get better within 1 week or if they get worse, check with your doctor.  Dulcolax (bisacodyl) - Pick up over-the-counter and take as directed by the  product packaging as needed to assist with the movement of your bowels.  Take with a full glass of water.  Use this product as needed if not relieved by Colace only.   MiraLax (polyethylene glycol) - Pick up over-the-counter to have on hand.  MiraLax is a solution that will increase the amount of water in your bowels to assist with bowel movements.  Take as directed and can mix with a glass of water, juice, soda, coffee, or tea.  Take if you go more than two days without a movement. Do not use MiraLax more than once per day. Call your doctor if you are still constipated or irregular after using this medication for 7 days in a row.  If you continue to have problems with postoperative constipation, please contact the office for further assistance and recommendations.  If you experience "the worst abdominal pain ever" or develop nausea or vomiting, please contact the office immediatly for further recommendations for treatment.  ITCHING  If you experience itching with your medications, try taking only a single pain pill, or even half a pain pill at a time.  You can also use Benadryl over the counter for itching or also to help with sleep.   TED HOSE STOCKINGS Wear the elastic stockings on both legs for three weeks following surgery during the day but you may remove then at night for sleeping.  MEDICATIONS See your medication summary on the After Visit Summary that the nursing staff will review with you prior to discharge.  You may have some home medications which will be placed on hold until you complete the course of blood thinner medication.  It is important for you to complete the blood thinner medication as prescribed by your surgeon.  Continue your approved medications as instructed at time of discharge.  PRECAUTIONS If you experience chest pain or shortness of breath - call 911 immediately for transfer to the hospital emergency department.  If you develop a fever greater that 101 F, purulent  drainage from wound, increased redness or drainage from wound, foul odor from the wound/dressing, or calf pain - CONTACT YOUR SURGEON.                                                   FOLLOW-UP APPOINTMENTS Make sure you keep all of your appointments after your operation with your surgeon and caregivers. You should call the office at the above phone number and make an appointment for approximately two weeks after the date of your surgery or on the date instructed by your surgeon outlined in the "After Visit Summary".  RANGE OF MOTION AND STRENGTHENING EXERCISES  These exercises are designed to help you keep full movement of your hip joint. Follow your caregiver's or physical therapist's instructions. Perform all exercises about fifteen times, three times per day or as directed. Exercise both hips, even if you  have had only one joint replacement. These exercises can be done on a training (exercise) mat, on the floor, on a table or on a bed. Use whatever works the best and is most comfortable for you. Use music or television while you are exercising so that the exercises are a pleasant break in your day. This will make your life better with the exercises acting as a break in routine you can look forward to.  Lying on your back, slowly slide your foot toward your buttocks, raising your knee up off the floor. Then slowly slide your foot back down until your leg is straight again.  Lying on your back spread your legs as far apart as you can without causing discomfort.  Lying on your side, raise your upper leg and foot straight up from the floor as far as is comfortable. Slowly lower the leg and repeat.  Lying on your back, tighten up the muscle in the front of your thigh (quadriceps muscles). You can do this by keeping your leg straight and trying to raise your heel off the floor. This helps strengthen the largest muscle supporting your knee.  Lying on your back, tighten up the muscles of your buttocks both  with the legs straight and with the knee bent at a comfortable angle while keeping your heel on the floor.      IF YOU ARE TRANSFERRED TO A SKILLED REHAB FACILITY If the patient is transferred to a skilled rehab facility following release from the hospital, a list of the current medications will be sent to the facility for the patient to continue.  When discharged from the skilled rehab facility, please have the facility set up the patient's [place] prior to being released. Also, the skilled facility will be responsible for providing the patient with their medications at time of release from the facility to include their pain medication, the muscle relaxants, and their blood thinner medication. If the patient is still at the rehab facility at time of the two week follow up appointment, the skilled rehab facility will also need to assist the patient in arranging follow up appointment in our office and any transportation needs.  MAKE SURE YOU:  Understand these instructions.  Get help right away if you are not doing well or get worse.    Pick up stool softner and laxative for home use following surgery while on pain medications. Do not submerge incision under water. Please use good hand washing techniques while changing dressing each day. May shower starting three days after surgery. Please use a clean towel to pat the incision dry following showers. Continue to use ice for pain and swelling after surgery. Do not use any lotions or creams on the incision until instructed by your surgeon.  Take Xarelto for two and a half more weeks following discharge from the hospital, then discontinue Xarelto. Once the patient has completed the blood thinner regimen, then take a Baby 81 mg Aspirin daily for three more weeks.    Information on my medicine - XARELTO (Rivaroxaban)  Why was Xarelto prescribed for you? Xarelto was prescribed for you to reduce the risk of blood clots  forming after orthopedic surgery. The medical term for these abnormal blood clots is venous thromboembolism (VTE).  What do you need to know about xarelto ? Take your Xarelto ONCE DAILY at the same time every day. You may take it either with or without food.  If you have difficulty swallowing the tablet whole,  you may crush it and mix in applesauce just prior to taking your dose.  Take Xarelto exactly as prescribed by your doctor and DO NOT stop taking Xarelto without talking to the doctor who prescribed the medication.  Stopping without other VTE prevention medication to take the place of Xarelto may increase your risk of developing a clot.  After discharge, you should have regular check-up appointments with your healthcare provider that is prescribing your Xarelto.    What do you do if you miss a dose? If you miss a dose, take it as soon as you remember on the same day then continue your regularly scheduled once daily regimen the next day. Do not take two doses of Xarelto on the same day.   Important Safety Information A possible side effect of Xarelto is bleeding. You should call your healthcare provider right away if you experience any of the following: ? Bleeding from an injury or your nose that does not stop. ? Unusual colored urine (red or dark brown) or unusual colored stools (red or black). ? Unusual bruising for unknown reasons. ? A serious fall or if you hit your head (even if there is no bleeding).  Some medicines may interact with Xarelto and might increase your risk of bleeding while on Xarelto. To help avoid this, consult your healthcare provider or pharmacist prior to using any new prescription or non-prescription medications, including herbals, vitamins, non-steroidal anti-inflammatory drugs (NSAIDs) and supplements.  This website has more information on Xarelto: [email or web address]

## 2016-11-23 NOTE — H&P (View-Only) (Signed)
[name] DOB: 1965/09/26 Married / Language: English / Race: White Male Date of Admission:  11/23/2016 CC:  Right hip pain History of Present Illness The patient is a 51 year old male who comes in for a preoperative History and Physical. The patient is scheduled for a right hip bearing surface exhcnage versus total hip revision to be performed by Dr. Dione Plover. Aluisio, MD at [facility] on 11/23/2016. The patient is a 51 year old male who presented for follow up of their hip. The patient is being followed for their bilateral hip pain. They are 12 year(s) out from total hip surgery (left THA March 2006; right THA June 2006). Symptoms reported include: pain (right hip tightness/stiffness. pain getting in/out of car. Pt. can't tie his shoe on the right side). The patient feels that they are doing poorly and report their pain level to be moderate (lateral side pain). The following medication has been used for pain control: antiinflammatory medication (Enbrel injection) and Tylenol (not helping). The patient has reported improvement of their symptoms with: physical therapy. [name] has a history of bilateral hip replacements, which are metal on metal. He had this done in 2006. He has had no problems with the left hip. On the right hip, he has had some ongoing trouble. He notes pain on the lateral aspect of his hip as well as in the groin, especially when he is doing rotational movements such as putting shoes and socks on and getting in and out of a car. He does have some discomfort lying on his hips at night. Does feel some tenderness along the lateral aspect, has not noticed any swelling, has not noticed any complications of the incision. No fevers or chills, but does feel overall fatigue. In the past, he has had some elevated cobalt and chromium levels. He did get labs redrawn. Patient had repeat Chromium level which was 2.0 and Cobalt level was elevated at 7.9.  They have been treated  conservatively in the past for the above stated problem and despite conservative measures, they continue to have progressive pain and severe functional limitations and dysfunction. They have failed non-operative management including home exercise, medications, and formal therpay. It is felt that they would benefit from undergoing revisoin of the total joint replacement. Risks and benefits of the procedure have been discussed with the patient and they elect to proceed with surgery. There are no active contraindications to surgery such as ongoing infection or rapidly progressive neurological disease.   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 Osteoarthritis  Mother. Osteoporosis  Mother. Cerebrovascular Accident  Mother. Heart Disease  Mother. Hypertension  Mother. Kidney disease  Mother.  Social History Tobacco / smoke exposure  11/27/2013: no Living situation  live with spouse Tobacco use  Never smoker. 11/27/2013 Current drinker  11/27/2013: Currently drinks beer only occasionally per week Children  1 Exercise  Exercises rarely; does running / walking Not under pain contract  No history of drug/alcohol rehab  Number of flights of stairs before winded  2-3 Current work status  disabled Marital status  married  Medication History  Enbrel (50MG /ML Soln Pref Syr, Subcutaneous) Active.  Tylenol Extra Strength (500MG  Tablet, Oral) Active. (when needed) Aleve (220MG  Tablet, Oral) Active. (or Ibuprofen when needed) Vitamin d Active.  Past Surgical History Total Hip  Replacement  bilateral   Review of Systems General Present- Shakiness and Tiredness. Not Present- Chills, Fatigue, Fever, Memory Loss, Night Sweats, Weight Gain and Weight Loss. Skin Present- Psoriasis. Not Present- Eczema, Hives, Itching, Lesions and Rash. HEENT Not Present- Dentures, Double Vision, Headache, Hearing Loss, Tinnitus and Visual Loss. Neck Present- Neck Stiffness. Respiratory Not Present- Allergies, Chronic Cough, Coughing up blood, Shortness of breath at rest and Shortness of breath with exertion. Cardiovascular Not Present- Chest Pain, Difficulty Breathing Lying Down, Murmur, Palpitations, Racing/skipping heartbeats and Swelling. Gastrointestinal Not Present- Abdominal Pain, Bloody Stool, Constipation, Diarrhea, Difficulty Swallowing, Heartburn, Jaundice, Loss of appetitie, Nausea and Vomiting. Male Genitourinary Not Present- Blood in Urine, Discharge, Flank Pain, Incontinence, Painful Urination, Urgency, Urinary frequency, Urinary Retention, Urinating at Night and Weak urinary stream. Musculoskeletal Present- Back Pain, Decreased Range of Motion, Joint Stiffness, Morning Stiffness, Muscle Weakness and Swelling of Extremities. Not Present- Joint Pain, Joint Swelling, Muscle Pain and Spasms. Neurological Not Present- Blackout spells, Difficulty with balance, Dizziness, Paralysis, Tremor and Weakness. Psychiatric Not Present- Insomnia.  Vitals  Weight: 245 lb Height: 69in Body Surface Area: 2.25 m Body Mass Index: 36.18 kg/m  Pulse: 68 (Regular)  BP: 128/70 (Sitting, Right Arm, Standard)   Physical Exam General Mental Status -Alert, cooperative and good historian. General Appearance-pleasant, Not in acute distress. Orientation-Oriented X3. Build & Nutrition-Well nourished and Well developed.  Head and Neck Head-normocephalic, atraumatic . Neck Global Assessment - supple, no bruit auscultated on the right, no bruit auscultated on the  left.  Eye Pupil - Bilateral-Regular and Round. Motion - Bilateral-EOMI.  Chest and Lung Exam Auscultation Breath sounds - clear at anterior chest wall and clear at posterior chest wall. Adventitious sounds - No Adventitious sounds.  Cardiovascular Auscultation Rhythm - Regular rate and rhythm. Heart Sounds - S1 WNL and S2 WNL. Murmurs & Other Heart Sounds - Auscultation of the heart reveals - No Murmurs.  Abdomen Palpation/Percussion Tenderness - Abdomen is non-tender to palpation. Rigidity (guarding) - Abdomen is soft. Auscultation Auscultation of the abdomen reveals - Bowel sounds normal.  Male Genitourinary Note: Not done, not pertinent to present illness   Musculoskeletal Note: On physical exam, he is alert and oriented. He is in no acute distress. The left hip, he does have normal painless range of motion. Incision is healed. No signs of infection. The right hip, he does have some discomfort with passive and active range of motion, but no deficit range of motion is appreciated. No swelling noted over the hips. Calves are soft and nontender. Distal pulses 2+. Sensation and motor function intact to lower extremities.  IMAGING AP and lateral views of the hip show no signs of loosening of the prosthesis. No periprosthetic fractures. Prosthesis does look to be in good position.  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 [name]  IV TXA  Patient was instructed on what medications to stop prior to surgery.  Signed electronically by Joelene Millin, III PA-C

## 2016-11-23 NOTE — Transfer of Care (Signed)
Immediate Anesthesia Transfer of Care Note  Patient: STANLEY LYNESS  Procedure(s) Performed: Procedure(s): Acetabulum liner and femoral head revision (Right)  Patient Location: PACU  Anesthesia Type:Spinal  Level of Consciousness: awake, alert , oriented and patient cooperative  Airway & Oxygen Therapy: Patient Spontanous Breathing and Patient connected to face mask oxygen  Post-op Assessment: Report given to RN and Post -op Vital signs reviewed and stable  Post vital signs: Reviewed and stable  Last Vitals:  Vitals:   11/23/16 0822  BP: (!) 166/94  Pulse: 84  Resp: 18  Temp: 36.8 C    Last Pain:  Vitals:   11/23/16 0822  TempSrc: Oral      Patients Stated Pain Goal: 3 (76/28/31 5176)  Complications: No apparent anesthesia complications

## 2016-11-23 NOTE — Evaluation (Signed)
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 Plans DC tomorrow if able. Pt admitted with above diagnosis. Pt currently with functional limitations due to the deficits listed below (see PT Problem List).  Pt will benefit from skilled PT to increase their independence and safety with mobility to allow discharge to the venue listed below.       Follow Up Recommendations Home health PT    Equipment Recommendations  Crutches    Recommendations for Other Services       Precautions / Restrictions Precautions Precautions: Posterior Hip Precaution Comments: KI was on. removed for mobility. CNA to replace after in bed.  Required Braces or Orthoses: Knee Immobilizer - Right Restrictions RLE Weight Bearing: Weight bearing as tolerated      Mobility  Bed Mobility Overal bed mobility: Needs Assistance Bed Mobility: Supine to Sit     Supine to sit: Min assist;HOB elevated     General bed mobility comments: cues for precautions  Transfers Overall transfer level: Needs assistance Equipment used: Rolling walker (2 wheeled) Transfers: Sit to/from Stand Sit to Stand: Min assist;From elevated surface         General transfer comment: cues for hand and right leg position, precautions  Ambulation/Gait Ambulation/Gait assistance: Min assist Ambulation Distance (Feet): 60 Feet Assistive device: Rolling walker (2 wheeled) Gait Pattern/deviations: Step-to pattern;Step-through pattern Gait velocity: decr   General Gait Details: gait is smoothe, not antalgic, cues for sequence  Stairs            Wheelchair Mobility    Modified Rankin (Stroke Patients Only)       Balance                                             Pertinent Vitals/Pain  Pain Assessment: 0-10 Pain Score: 1  Pain Location: right hip Pain Descriptors / Indicators: Sore Pain Intervention(s): Monitored during session;Premedicated before session;Ice applied;Repositioned    Home Living Family/patient expects to be discharged to:: Private residence Living Arrangements: Spouse/significant other Available Help at Discharge: Family Type of Home: House Home Access: Level entry     Home Layout: Two level;Bed/bath upstairs;1/2 bath on main level Home Equipment: Walker - standard Additional Comments: has been sleeping in recliner due to hip    Prior Function Level of Independence: Independent               Hand Dominance        Extremity/Trunk Assessment   Upper Extremity Assessment Upper Extremity Assessment: Defer to OT evaluation    Lower Extremity Assessment Lower Extremity Assessment: RLE deficits/detail RLE Deficits / Details: advances the leg    Cervical / Trunk Assessment Cervical / Trunk Assessment: Normal  Communication   Communication: No difficulties  Cognition Arousal/Alertness: Awake/alert Behavior During Therapy: WFL for tasks assessed/performed Overall Cognitive Status: Within Functional Limits for tasks assessed                                        General Comments      Exercises     Assessment/Plan    PT Assessment  Patient needs continued PT services  PT Problem List Decreased strength;Decreased range of motion;Decreased activity tolerance;Decreased mobility;Decreased knowledge of precautions;Decreased safety awareness;Decreased knowledge of use of DME;Pain       PT Treatment Interventions DME instruction;Gait training;Stair training;Functional mobility training;Therapeutic activities;Patient/family education;Therapeutic exercise    PT Goals (Current goals can be found in the Care Plan section)  Acute Rehab PT Goals Patient Stated Goal: to walk PT Goal Formulation: With patient/family Time  For Goal Achievement: 11/26/16 Potential to Achieve Goals: Good    Frequency 7X/week   Barriers to discharge        Co-evaluation               AM-PAC PT "6 Clicks" Daily Activity  Outcome Measure Difficulty turning over in bed (including adjusting bedclothes, sheets and blankets)?: Total Difficulty moving from lying on back to sitting on the side of the bed? : Total Difficulty sitting down on and standing up from a chair with arms (e.g., wheelchair, bedside commode, etc,.)?: Total Help needed moving to and from a bed to chair (including a wheelchair)?: Total Help needed walking in hospital room?: Total Help needed climbing 3-5 steps with a railing? : Total 6 Click Score: 6    End of Session Equipment Utilized During Treatment: Gait belt Activity Tolerance: Patient tolerated treatment well Patient left: in chair;with call bell/phone within reach;with family/visitor present;with nursing/sitter in room Nurse Communication: Mobility status PT Visit Diagnosis: Difficulty in walking, not elsewhere classified (R26.2);Pain Pain - Right/Left: Right Pain - part of body: Hip    Time: 1219-7588 PT Time Calculation (min) (ACUTE ONLY): 22 min   Charges:   PT Evaluation $PT Eval Low Complexity: 1 Procedure     PT G CodesTresa Endo PT [phone]   Claretha Cooper 11/23/2016, 5:35 PM

## 2016-11-23 NOTE — Anesthesia Procedure Notes (Signed)
Spinal  Start time: 11/23/2016 9:45 AM End time: 11/23/2016 9:47 AM Staffing Resident/CRNA: Carleene Cooper A Preanesthetic Checklist Completed: patient identified, site marked, surgical consent, pre-op evaluation, timeout performed, IV checked, risks and benefits discussed and monitors and equipment checked Spinal Block Patient position: sitting Prep: ChloraPrep and site prepped and draped Patient monitoring: heart rate, continuous pulse ox and blood pressure Approach: midline Location: L2-3 Injection technique: single-shot Needle Needle type: Pencan  Needle gauge: 24 G Needle length: 10 cm Assessment Sensory level: T4 Additional Notes Pt placed in sitting position. Spinal kit date checked and verified. One attempt by CRNA. - heme, + CSF. Pt tolerated well.

## 2016-11-23 NOTE — Brief Op Note (Signed)
11/23/2016  11:21 AM  PATIENT:  Corliss Parish  51 y.o. male  PRE-OPERATIVE DIAGNOSIS:  Right hip metallosis   POST-OPERATIVE DIAGNOSIS:  Right hip metallosis   PROCEDURE:  Procedure(s): Acetabulum liner and femoral head revision (Right)  SURGEON:  Surgeon(s) and Role:    [name], MD - Primary  PHYSICIAN ASSISTANT:   ASSISTANTS: Arlee Muslim, PA-C   ANESTHESIA:   spinal  EBL:  Total I/O In: 1000 [I.V.:1000] Out: 575 [Urine:300; Blood:275]  BLOOD ADMINISTERED:none  DRAINS: (Medium) Hemovact drain(s) in the right hip with  Suction Open   LOCAL MEDICATIONS USED:  MARCAINE     COUNTS:  YES  TOURNIQUET:  * No tourniquets in log *  DICTATION: .Other Dictation: Dictation Number [id number]  PLAN OF CARE: Admit to inpatient   PATIENT DISPOSITION:  PACU - hemodynamically stable.

## 2016-11-23 NOTE — Anesthesia Postprocedure Evaluation (Signed)
Anesthesia Post Note  Patient: [name]  Procedure(s) Performed: Procedure(s) (LRB): Acetabulum liner and femoral head revision (Right)     Patient location during evaluation: PACU Anesthesia Type: Spinal Level of consciousness: patient cooperative, awake and alert and oriented Pain management: pain level controlled Vital Signs Assessment: post-procedure vital signs reviewed and stable Respiratory status: spontaneous breathing, nonlabored ventilation, respiratory function stable and patient connected to nasal cannula oxygen Cardiovascular status: blood pressure returned to baseline and stable Postop Assessment: patient able to bend at knees, no signs of nausea or vomiting and spinal receding Anesthetic complications: no    Last Vitals:  Vitals:   11/23/16 1230 11/23/16 1245  BP: 139/75 138/76  Pulse: 77 69  Resp: 13 13  Temp:      Last Pain:  Vitals:   11/23/16 0822  TempSrc: Oral    LLE Motor Response: Purposeful movement;Responds to commands (11/23/16 1245) LLE Sensation: Decreased (11/23/16 1245) RLE Motor Response: Purposeful movement;Responds to commands (11/23/16 1245) RLE Sensation: Decreased (11/23/16 1245) L Sensory Level: L4-Anterior knee, lower leg (11/23/16 1245) R Sensory Level: L2-Upper inner thigh, upper buttock (11/23/16 1245)  Neo Yepiz,E. Graylin Sperling

## 2016-11-24 ENCOUNTER — Encounter (HOSPITAL_COMMUNITY): Payer: Self-pay | Admitting: Orthopedic Surgery

## 2016-11-24 LAB — CBC
HCT: 36.5 % — ABNORMAL LOW (ref 39.0–52.0)
HEMOGLOBIN: 12.5 g/dL — AB (ref 13.0–17.0)
MCH: 30.8 pg (ref 26.0–34.0)
MCHC: 34.2 g/dL (ref 30.0–36.0)
MCV: 89.9 fL (ref 78.0–100.0)
Platelets: 135 10*3/uL — ABNORMAL LOW (ref 150–400)
RBC: 4.06 MIL/uL — ABNORMAL LOW (ref 4.22–5.81)
RDW: 13.7 % (ref 11.5–15.5)
WBC: 8.3 10*3/uL (ref 4.0–10.5)

## 2016-11-24 LAB — BASIC METABOLIC PANEL
Anion gap: 8 (ref 5–15)
BUN: 17 mg/dL (ref 6–20)
CO2: 24 mmol/L (ref 22–32)
Calcium: 8.5 mg/dL — ABNORMAL LOW (ref 8.9–10.3)
Chloride: 107 mmol/L (ref 101–111)
Creatinine, Ser: 0.91 mg/dL (ref 0.61–1.24)
GFR calc Af Amer: >60 mL/min (ref >60)
GFR calc non Af Amer: >60 mL/min (ref >60)
Glucose, Bld: 113 mg/dL — ABNORMAL HIGH (ref 65–99)
Potassium: 4.2 mmol/L (ref 3.5–5.1)
Sodium: 139 mmol/L (ref 135–145)

## 2016-11-24 MED ORDER — OXYCODONE HCL 5 MG PO TABS: 5.0000 mg | ORAL_TABLET | ORAL | 0 refills | Status: DC | PRN

## 2016-11-24 MED ORDER — METHOCARBAMOL 500 MG PO TABS: 500.0000 mg | ORAL_TABLET | Freq: Four times a day (QID) | ORAL | 0 refills | Status: DC | PRN

## 2016-11-24 MED ORDER — RIVAROXABAN 10 MG PO TABS: 10.0000 mg | ORAL_TABLET | Freq: Every day | ORAL | 0 refills | Status: DC

## 2016-11-24 MED FILL — METHOCARBAMOL 500 MG TABLET: 500 | 20 days supply | Qty: 80 | Fill #0

## 2016-11-24 MED FILL — XARELTO 10 MG TABLET: 10 | 20 days supply | Qty: 20 | Fill #0

## 2016-11-24 MED FILL — oxyCODONE HCL 5 MG TABS: 5 | 5 days supply | Qty: 60 | Fill #0

## 2016-11-24 NOTE — Consult Note (Signed)
   [facility] Inpatient Consult   11/24/2016  CAYLOR TALLARICO 1966/03/25 [id number]   Came to bedside to speak with Mr. Bennett and wife about [facility] to Wellness program for [facility] employees/dependents with [facility] insurance.   Mr. Bonura indicates he recalls receiving a pre- admission call. Made him aware that this is the same staff that will follow up with post discharge call. He remains agreeable to post discharge telephonic contact.   Denies having any current Link to Wellness needs at this time. Provided Link to [employer], contact information, and 24-hr nurse line magnet.  Expressed appreciation of visit.   Inpatient RNCM aware of bedside encounter.   Marthenia Rolling, MSN-Ed, RN,BSN [facility] Liaison [phone]

## 2016-11-24 NOTE — Progress Notes (Signed)
   Subjective: 1 Day Post-Op Procedure(s) (LRB): Acetabulum liner and femoral head revision (Right) Patient reports pain as mild.   Patient seen in rounds with Dr. Wynelle Link. Doing okay this morning. Patient is well, but has had some minor complaints of pain in the hip, requiring pain medications We will start therapy today.  If they do well with therapy and meets all goals, then will allow home later this afternoon following therapy. Plan is to go Home after hospital stay.  Objective: Vital signs in last 24 hours: Temp:  [97.5 F (36.4 C)-97.9 F (36.6 C)] 97.9 F (36.6 C) (07/26 0628) Pulse Rate:  [62-96] 76 (07/26 0628) Resp:  [12-15] 14 (07/26 0628) BP: (123-140)/(68-91) 132/76 (07/26 0628) SpO2:  [96 %-100 %] 98 % (07/26 0628) Weight:  [111.3 kg (245 lb 6.4 oz)] 111.3 kg (245 lb 6.4 oz) (07/25 0848)  Intake/Output from previous day:  Intake/Output Summary (Last 24 hours) at 11/24/16 0833 Last data filed at 11/24/16 0629  Gross per 24 hour  Intake          5038.33 ml  Output             2945 ml  Net          2093.33 ml    Intake/Output this shift: No intake/output data recorded.  Labs:  Recent Labs  11/24/16 0545  HGB 12.5*    Recent Labs  11/24/16 0545  WBC 8.3  RBC 4.06*  HCT 36.5*  PLT 135*    Recent Labs  11/24/16 0545  NA 139  K 4.2  CL 107  CO2 24  BUN 17  CREATININE 0.91  GLUCOSE 113*  CALCIUM 8.5*   No results for input(s): LABPT, INR in the last 72 hours.  EXAM General - Patient is Alert, Appropriate and Oriented Extremity - Neurovascular intact Sensation intact distally Intact pulses distally Dorsiflexion/Plantar flexion intact Dressing - dressing C/D/I Motor Function - intact, moving foot and toes well on exam.  Hemovac pulled without difficulty.  Past Medical History:  Diagnosis Date  . History of avascular necrosis of capital femoral epiphysis 2006, 2007   Bilateral Femoral Head  . HLD (hyperlipidemia)   . Psoriatic  arthritis (HCC)     Assessment/Plan: 1 Day Post-Op Procedure(s) (LRB): Acetabulum liner and femoral head revision (Right) Principal Problem:   Failed total hip arthroplasty (HCC)  Estimated body mass index is 36.24 kg/m as calculated from the following:   Height as of this encounter: 5\' 9"  (1.753 m).   Weight as of this encounter: 111.3 kg (245 lb 6.4 oz). Up with therapy Discharge home with home health  DVT Prophylaxis - Xarelto Weight Bearing As Tolerated right Leg Hemovac Pulled Begin Therapy  If meets goals and able to go home: Discharge home with home health Diet - Cardiac diet Follow up - in 2 weeks Activity - WBAT Disposition - Home Condition Upon Discharge - Good D/C Meds - See DC Summary DVT Prophylaxis - [place], PA-C Orthopaedic Surgery 11/24/2016, 8:33 AM

## 2016-11-24 NOTE — Progress Notes (Signed)
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  Ready for DC.  Follow Up Recommendations  Home health PT     Equipment Recommendations  None recommended by PT    Recommendations for Other Services       Precautions / Restrictions Precautions Precautions: Posterior Hip Restrictions RLE Weight Bearing: Weight bearing as tolerated    Mobility  Bed Mobility Overal bed mobility: Needs Assistance Bed Mobility: Supine to Sit;Sit to Supine     Supine to sit: Supervision Sit to supine: Supervision   General bed mobility comments: patient managed right leg  Transfers Overall transfer level: Needs assistance Equipment used: Rolling walker (2 wheeled) Transfers: Sit to/from Stand Sit to Stand: Supervision         General transfer comment: cues for hand and right leg position, precautions  Ambulation/Gait Ambulation/Gait assistance: Supervision Ambulation Distance (Feet): 20 Feet Assistive device: Standard walker Gait Pattern/deviations: Step-through pattern     General Gait Details: gait is smoothe, not antalgic, cues for sequence   Stairs Stairs: Yes   Stair Management: One rail Right;Step to pattern;Forwards;With crutches Number of Stairs: 4 General stair comments: wife present to  assist in case patient wants to get to second level  Wheelchair Mobility    Modified Rankin (Stroke Patients Only)       Balance                                            Cognition Arousal/Alertness: Awake/alert                                            Exercises Total Joint Exercises Ankle Circles/Pumps: AROM;10 reps Short Arc Quad: AROM;Right;10 reps Heel Slides: AAROM;Right;10 reps Hip ABduction/ADduction:  AAROM;Right;10 reps    General Comments        Pertinent Vitals/Pain Pain Score: 4  Pain Location: right hip Pain Descriptors / Indicators: Sore;Tightness Pain Intervention(s): Repositioned;Premedicated before session;Monitored during session    Home Living                      Prior Function            PT Goals (current goals can now be found in the care plan section) Progress towards PT goals: Progressing toward goals    Frequency    7X/week      PT Plan Current plan remains appropriate    Co-evaluation              AM-PAC PT "6 Clicks" Daily Activity  Outcome Measure  Difficulty turning over in bed (including adjusting bedclothes, sheets and blankets)?: A Little Difficulty moving from lying on back to sitting on the side of the bed? : A Little Difficulty sitting down on and standing up from a chair with arms (e.g., wheelchair, bedside commode, etc,.)?: Total Help needed moving to and from a bed to chair (including a wheelchair)?: A Little Help needed walking in hospital room?: A Little Help needed climbing 3-5 steps with a railing? : A Lot 6 Click Score: 15  End of Session   Activity Tolerance: Patient tolerated treatment well Patient left: in chair;with call bell/phone within reach Nurse Communication: Mobility status PT Visit Diagnosis: Difficulty in walking, not elsewhere classified (R26.2);Pain Pain - Right/Left: Right Pain - part of body: Hip     Time: 4695-0722 PT Time Calculation (min) (ACUTE ONLY): 40 min  Charges:  $Gait Training: 8-22 mins $Therapeutic Exercise: 8-22 mins $Self Care/Home Management: 8-22                    G CodesTresa Endo PT [phone]    Claretha Cooper 11/24/2016, 2:43 PM

## 2016-11-24 NOTE — Op Note (Signed)
NAME:  [name], [name]                     ACCOUNT NO.:  MEDICAL RECORD NO.:  [id number]  LOCATION:                                 FACILITY:  PHYSICIAN:  [name], M.D.    DATE OF BIRTH:  06/20/1965  DATE OF PROCEDURE:  11/23/2016 DATE OF DISCHARGE:                              OPERATIVE REPORT   PREOPERATIVE DIAGNOSIS:  Metallosis, right hip.  POSTOPERATIVE DIAGNOSIS:  Metallosis, right hip.  PROCEDURE:  Right hip acetabular liner and femoral head revision.  SURGEON:  [name], M.D.  ASSISTANT:  Alexzandrew L. Perkins, P.A.C.  ANESTHESIA:  Spinal.  ESTIMATED BLOOD LOSS:  275.  DRAINS:  Hemovac x1.  COMPLICATIONS:  None.  CONDITION:  Stable to recovery.  BRIEF CLINICAL NOTE:  [name] is a 51 year old male, had a right total hip arthroplasty done several years ago with metal-on-metal technology.  He has had worsening pain in his right hip and MARS MRI showed fluid collection versus pseudotumor.  Blood levels were showing a highly elevated cobalt level.  He presents now for bearing surface versus total hip arthroplasty revision.  PROCEDURE IN DETAIL:  After successful administration of spinal anesthetic, the patient was placed in the left lateral decubitus position with the right side up and held with the hip positioner.  Right lower extremity was isolated from his perineum with plastic drapes, and prepped and draped in usual sterile fashion.  Short posterolateral incisions were utilized, skin cut with a 10-blade through the subcutaneous tissue to the fascia lata, which was incised in line with the skin incision.  Sciatic nerve was palpated and protected.  The posterior pseudocapsule identified and incised.  There was a fair amount of debris in there consistent with pseudotumor.  It was not fluid, but it was a thickened blood-type material with necrotic tissue. Fortunately, the muscle did not have any damage.  There was some bone loss in the greater  trochanter, but the trochanter overall remained intact.  I was then able to dislocate the hip.  I removed the femoral head from the neck.  The neck did not have any trunnionosis or wear on the trunnion.  The femur was then retracted anteriorly to gain acetabular exposure.  Acetabular retractors were placed and I removed the metal liner from the acetabular shell.  The shell was in excellent position and no damage to the inner-aspect of the shell.  It was well fixed.  I then thoroughly irrigated out the shell and placed a new 36-mm neutral +4 polyethylene marathon liner.  This locked into the acetabular shell.  We then placed a 36+ 0 metal head.  He did not want to place a ceramic head on the old prosthesis.  The femur was well fixed and in excellent position.  I did not want to revise the stem as it was well fixed and in excellent position.  We placed the 36+ 0 metal head, reduced the hip with outstanding stability.  There was full extension, full external rotation, 70 degrees of flexion, 40 degrees of adduction and 90 degrees of internal rotation, then 90 degrees of flexion and 70 degrees of internal rotation.  By placing  right leg on top of the left, the leg lengths were equal.  The wound was copiously irrigated with saline solution and the posterior pseudocapsule reattached to the femur with Ethibond suture.  Fascia lata was closed over Hemovac drain with a running #1 V-Loc suture.  Subcu was closed in 2 layers with V-Loc and 2- 0 Vicryl.  The subcuticular was closed with a running 4-0 Monocryl.  The drains hooked to suction.  Incision cleaned and dried, and Steri-Strips and a bulky sterile dressing were applied.  He was placed into a knee immobilizer, awakened and transported to recovery in stable condition.  Note that a surgical assistant is a medical necessity for this procedure to do it in a safe and expeditious manner.  Surgical assistance was necessary for retraction of vital  neurovascular structures and for proper positioning of the limb for removal of the old implant and for safe, accurate and proper placement of the new implant.     [name], M.D.     FA/MEDQ  D:  11/23/2016  T:  11/23/2016  Job:  [id number]

## 2016-11-24 NOTE — Discharge Summary (Signed)
Physician Discharge Summary   Patient ID: [name] MRN: [id number] DOB/AGE: 1965-07-13 51 y.o.  Admit date: 11/23/2016 Discharge date: 11/24/2016  Primary Diagnosis:  Metallosis, right hip.  Admission Diagnoses:  Past Medical History:  Diagnosis Date  . History of avascular necrosis of capital femoral epiphysis 2006, 2007   Bilateral Femoral Head  . HLD (hyperlipidemia)   . Psoriatic arthritis [facility])    Discharge Diagnoses:   Principal Problem:   Failed total hip arthroplasty ([place])  Estimated body mass index is 36.24 kg/m as calculated from the following:   Height as of this encounter: 5' 9" (1.753 m).   Weight as of this encounter: 111.3 kg (245 lb 6.4 oz).  Procedure(s) (LRB): Acetabulum liner and femoral head revision (Right)   Consults: None  HPI: [name] is a 51 year old male, had a right total hip arthroplasty done several years ago with metal-on-metal technology.  He has had worsening pain in his right hip and MARS MRI showed fluid collection versus pseudotumor.  Blood levels were showing a highly elevated cobalt level.  He presents now for bearing surface versus total hip arthroplasty revision.  Laboratory Data: Admission on 11/23/2016  Component Date Value Ref Range Status  . WBC 11/24/2016 8.3  4.0 - 10.5 K/uL Final  . RBC 11/24/2016 4.06* 4.22 - 5.81 MIL/uL Final  . Hemoglobin 11/24/2016 12.5* 13.0 - 17.0 g/dL Final  . HCT 11/24/2016 36.5* 39.0 - 52.0 % Final  . MCV 11/24/2016 89.9  78.0 - 100.0 fL Final  . MCH 11/24/2016 30.8  26.0 - 34.0 pg Final  . MCHC 11/24/2016 34.2  30.0 - 36.0 g/dL Final  . RDW 11/24/2016 13.7  11.5 - 15.5 % Final  . Platelets 11/24/2016 135* 150 - 400 K/uL Final  . Sodium 11/24/2016 139  135 - 145 mmol/L Final  . Potassium 11/24/2016 4.2  3.5 - 5.1 mmol/L Final  . Chloride 11/24/2016 107  101 - 111 mmol/L Final  . CO2 11/24/2016 24  22 - 32 mmol/L Final  . Glucose, Bld 11/24/2016 113* 65 - 99 mg/dL Final  . BUN  11/24/2016 17  6 - 20 mg/dL Final  . Creatinine, Ser 11/24/2016 0.91  0.61 - 1.24 mg/dL Final  . Calcium 11/24/2016 8.5* 8.9 - 10.3 mg/dL Final  . GFR calc non Af Amer 11/24/2016 >60  >60 mL/min Final  . GFR calc Af Amer 11/24/2016 >60  >60 mL/min Final   Comment: (NOTE) The eGFR has been calculated using the CKD EPI equation. This calculation has not been validated in all clinical situations. eGFR's persistently <60 mL/min signify possible Chronic Kidney Disease.   Georgiann Hahn gap 11/24/2016 8  5 - 15 Final  Hospital Outpatient Visit on 11/16/2016  Component Date Value Ref Range Status  . Hgb A1c MFr Bld 11/16/2016 5.1  4.8 - 5.6 % Final   Comment: (NOTE)         Pre-diabetes: 5.7 - 6.4         Diabetes: >6.4         Glycemic control for adults with diabetes: <7.0   . Mean Plasma Glucose 11/16/2016 100  mg/dL Final   Comment: (NOTE) Performed At: [facility] [place], [place] [id number] Lindon Romp MD [id number]   . aPTT 11/16/2016 29  24 - 36 seconds Final  . WBC 11/16/2016 5.1  4.0 - 10.5 K/uL Final  . RBC 11/16/2016 4.78  4.22 - 5.81 MIL/uL Final  . Hemoglobin 11/16/2016 14.6  13.0 - 17.0 g/dL Final  . HCT 11/16/2016 43.6  39.0 - 52.0 % Final  . MCV 11/16/2016 91.2  78.0 - 100.0 fL Final  . MCH 11/16/2016 30.5  26.0 - 34.0 pg Final  . MCHC 11/16/2016 33.5  30.0 - 36.0 g/dL Final  . RDW 11/16/2016 13.7  11.5 - 15.5 % Final  . Platelets 11/16/2016 136* 150 - 400 K/uL Final  . Sodium 11/16/2016 138  135 - 145 mmol/L Final  . Potassium 11/16/2016 4.4  3.5 - 5.1 mmol/L Final  . Chloride 11/16/2016 106  101 - 111 mmol/L Final  . CO2 11/16/2016 24  22 - 32 mmol/L Final  . Glucose, Bld 11/16/2016 93  65 - 99 mg/dL Final  . BUN 11/16/2016 28* 6 - 20 mg/dL Final  . Creatinine, Ser 11/16/2016 1.08  0.61 - 1.24 mg/dL Final  . Calcium 11/16/2016 9.0  8.9 - 10.3 mg/dL Final  . Total Protein 11/16/2016 7.4  6.5 - 8.1 g/dL Final  . Albumin 11/16/2016 4.3   3.5 - 5.0 g/dL Final  . AST 11/16/2016 22  15 - 41 U/L Final  . ALT 11/16/2016 23  17 - 63 U/L Final  . Alkaline Phosphatase 11/16/2016 80  38 - 126 U/L Final  . Total Bilirubin 11/16/2016 0.6  0.3 - 1.2 mg/dL Final  . GFR calc non Af Amer 11/16/2016 >60  >60 mL/min Final  . GFR calc Af Amer 11/16/2016 >60  >60 mL/min Final   Comment: (NOTE) The eGFR has been calculated using the CKD EPI equation. This calculation has not been validated in all clinical situations. eGFR's persistently <60 mL/min signify possible Chronic Kidney Disease.   . Anion gap 11/16/2016 8  5 - 15 Final  . Prothrombin Time 11/16/2016 13.1  11.4 - 15.2 seconds Final  . INR 11/16/2016 0.99   Final  . ABO/RH(D) 11/16/2016 O POS   Final  . Antibody Screen 11/16/2016 NEG   Final  . Sample Expiration 11/16/2016 11/26/2016   Final  . Extend sample reason 11/16/2016 NO TRANSFUSIONS OR PREGNANCY IN THE PAST 3 MONTHS   Final  . MRSA, PCR 11/16/2016 NEGATIVE  NEGATIVE Final  . Staphylococcus aureus 11/16/2016 NEGATIVE  NEGATIVE Final   Comment:        The Xpert SA Assay (FDA approved for NASAL specimens in patients over 17 years of age), is one component of a comprehensive surveillance program.  Test performance has been validated by [facility] for patients greater than or equal to 58 year old. It is not intended to diagnose infection nor to guide or monitor treatment.   . ABO/RH(D) 11/16/2016 O POS   Final  Office Visit on 09/30/2016  Component Date Value Ref Range Status  . Lead, Blood 09/30/2016 None Detected  0 - 19 ug/dL Final   Comment:                           Environmental Exposure:                            WHO Recommendation    <20                           Occupational Exposure:  OSHA Lead Std          40                            BEI                    30                                 Detection Limit =  1   . Arsenic 09/30/2016 7  2 - 23 ug/L Final                                    Detection Limit = 1  . Mercury 09/30/2016 1.1  0.0 - 14.9 ug/L Final   Comment:                         Environmental Exposure:  <15.0                         Occupational Exposure:                          BEI - Inorganic Mercury: 15.0                                 Detection Limit =  1.0      X-Rays:Dg Pelvis Portable  Result Date: 11/23/2016 CLINICAL DATA:  RIGHT hip surgery EXAM: PORTABLE PELVIS 1-2 VIEWS COMPARISON:  Portable exam 1200 hours compared to MRI 03/27/2014 and radiograph of 10/04/2004 FINDINGS: Osseous demineralization. BILATERAL hip joint prostheses. No acute fracture or dislocation. Lucencies identified adjacent to the proximal portions of the femoral prostheses bilaterally likely representing osteolysis due to particle disease. Surgical drain noted laterally at the RIGHT hip. IMPRESSION: RIGHT hip prosthesis without acute complication. Lucencies at the proximal femoral bilaterally adjacent to the femoral components of the hip prostheses most likely representing particle disease. Electronically Signed   By: Lavonia Dana M.D.   On: 11/23/2016 12:31    EKG: Orders placed or performed in visit on 03/10/10  . Converted CEMR EKG     Hospital Course: Patient was admitted to [facility] and taken to the OR and underwent the above state procedure without complications.  Patient tolerated the procedure well and was later transferred to the recovery room and then to the orthopaedic floor for postoperative care.  They were given PO and IV analgesics for pain control following their surgery.  They were given 24 hours of postoperative antibiotics of  Anti-infectives    Start     Dose/Rate Route Frequency Ordered Stop   11/23/16 1600  ceFAZolin (ANCEF) IVPB 2g/100 mL premix     2 g 200 mL/hr over 30 Minutes Intravenous Every 6 hours 11/23/16 1330 11/23/16 2325   11/23/16 0818  ceFAZolin (ANCEF) 2-4 GM/100ML-% IVPB    Comments:  [place] Session   : cabinet  override      11/23/16 0818 11/23/16 0949   11/23/16 0816  ceFAZolin (ANCEF) IVPB 2g/100 mL premix     2 g 200 mL/hr over 30 Minutes Intravenous On call to O.R. 11/23/16 0762 11/23/16 1009  and started on DVT prophylaxis in the form of Xarelto.   PT and OT were ordered for total hip protocol.  The patient was allowed to be WBAT with therapy. Discharge planning was consulted to help with postop disposition and equipment needs.  Patient had a good night on the evening of surgery.  They started to get up OOB with therapy on day one.  Hemovac drain was pulled without difficulty.  The knee immobilizer was removed and discontinued.  Dressing was checked and was clean and dry.  Patient was seen in rounds on POD 1 by Dr. Wynelle Link and it was felt that as long as they did well with therapy they would be ready to go home later that same day.   Diet: Cardiac diet Activity:WBAT No bending hip over 90 degrees- A "L" Angle Do not cross legs Do not let foot roll inward When turning these patients a pillow should be placed between the patient's legs to prevent crossing. Patients should have the affected knee fully extended when trying to sit or stand from all surfaces to prevent excessive hip flexion. When ambulating and turning toward the affected side the affected leg should have the toes turned out prior to moving the walker and the rest of patient's body as to prevent internal rotation/ turning in of the leg. Abduction pillows are the most effective way to prevent a patient from not crossing legs or turning toes in at rest. If an abduction pillow is not ordered placing a regular pillow length wise between the patient's legs is also an effective reminder. It is imperative that these precautions be maintained so that the surgical hip does not dislocate. Follow-up:in 2 weeks Disposition - Home Discharged Condition: stable   Discharge Instructions    Call MD / Call 911    Complete by:  As directed    If  you experience chest pain or shortness of breath, CALL 911 and be transported to the hospital emergency room.  If you develope a fever above 101 F, pus (white drainage) or increased drainage or redness at the wound, or calf pain, call your surgeon's office.   Change dressing    Complete by:  As directed    You may change your dressing dressing daily with sterile 4 x 4 inch gauze dressing and paper tape.  Do not submerge the incision under water.   Constipation Prevention    Complete by:  As directed    Drink plenty of fluids.  Prune juice may be helpful.  You may use a stool softener, such as Colace (over the counter) 100 mg twice a day.  Use MiraLax (over the counter) for constipation as needed.   Diet - low sodium heart healthy    Complete by:  As directed    Discharge instructions    Complete by:  As directed    Take Xarelto for two and a half more weeks, then discontinue Xarelto. Once the patient has completed the blood thinner regimen, then take a Baby 81 mg Aspirin daily for three more weeks.  Pick up stool softner and laxative for home use following surgery while on pain medications. Do not submerge incision under water. Please use good hand washing techniques while changing dressing each day. May shower starting three days after surgery. Please use a clean towel to pat the incision dry following showers. Continue to use ice for pain and swelling after surgery. Do not use any lotions or creams on the incision until instructed by your  Psychologist, sport and exercise.  Wear both TED hose on both legs during the day every day for three weeks, but may remove the TED hose at night at home.  Postoperative Constipation Protocol  Constipation - defined medically as fewer than three stools per week and severe constipation as less than one stool per week.  One of the most common issues patients have following surgery is constipation.  Even if you have a regular bowel pattern at home, your normal regimen is likely  to be disrupted due to multiple reasons following surgery.  Combination of anesthesia, postoperative narcotics, change in appetite and fluid intake all can affect your bowels.  In order to avoid complications following surgery, here are some recommendations in order to help you during your recovery period.  Colace (docusate) - Pick up an over-the-counter form of Colace or another stool softener and take twice a day as long as you are requiring postoperative pain medications.  Take with a full glass of water daily.  If you experience loose stools or diarrhea, hold the colace until you stool forms back up.  If your symptoms do not get better within 1 week or if they get worse, check with your doctor.  Dulcolax (bisacodyl) - Pick up over-the-counter and take as directed by the product packaging as needed to assist with the movement of your bowels.  Take with a full glass of water.  Use this product as needed if not relieved by Colace only.   MiraLax (polyethylene glycol) - Pick up over-the-counter to have on hand.  MiraLax is a solution that will increase the amount of water in your bowels to assist with bowel movements.  Take as directed and can mix with a glass of water, juice, soda, coffee, or tea.  Take if you go more than two days without a movement. Do not use MiraLax more than once per day. Call your doctor if you are still constipated or irregular after using this medication for 7 days in a row.  If you continue to have problems with postoperative constipation, please contact the office for further assistance and recommendations.  If you experience "the worst abdominal pain ever" or develop nausea or vomiting, please contact the office immediatly for further recommendations for treatment.   Do not sit on low chairs, stoools or toilet seats, as it may be difficult to get up from low surfaces    Complete by:  As directed    Driving restrictions    Complete by:  As directed    No driving until  released by the physician.   Follow the hip precautions as taught in Physical Therapy    Complete by:  As directed    Increase activity slowly as tolerated    Complete by:  As directed    Lifting restrictions    Complete by:  As directed    No lifting until released by the physician.   Patient may shower    Complete by:  As directed    You may shower without a dressing once there is no drainage.  Do not wash over the wound.  If drainage remains, do not shower until drainage stops.   TED hose    Complete by:  As directed    Use stockings (TED hose) for 3 weeks on both leg(s).  You may remove them at night for sleeping.   Weight bearing as tolerated    Complete by:  As directed    Laterality:  right   Extremity:  Lower  Allergies as of 11/24/2016      Reactions   Gold-containing Drug Products Rash   Denies oral or airway involvement - occurred in the 1980s.       Medication List    STOP taking these medications   CO Q-10 PO   [employer] VITAMIN D3 DROPS/INFANT 400 UT/0.028ML Liqd Generic drug:  Cholecalciferol   ibuprofen 200 MG tablet Commonly known as:  ADVIL,MOTRIN   Vitamin B-12 5000 MCG Subl   Vitamin D (Ergocalciferol) 50000 units Caps capsule Commonly known as:  DRISDOL   VITAMIN D3 PO     TAKE these medications   acetaminophen 500 MG tablet Commonly known as:  TYLENOL Take 500-1,000 mg by mouth 3 (three) times daily as needed for mild pain.   methocarbamol 500 MG tablet Commonly known as:  ROBAXIN Take 1 tablet (500 mg total) by mouth every 6 (six) hours as needed for muscle spasms.   oxyCODONE 5 MG immediate release tablet Commonly known as:  Oxy IR/ROXICODONE Take 1-2 tablets (5-10 mg total) by mouth every 4 (four) hours as needed for moderate pain or severe pain.   rivaroxaban 10 MG Tabs tablet Commonly known as:  XARELTO Take 1 tablet (10 mg total) by mouth daily with breakfast. Take Xarelto for two and a half more weeks following discharge from the  hospital, then discontinue Xarelto. Once the patient has completed the blood thinner regimen, then take a Baby 81 mg Aspirin daily for three more weeks.   traMADol 50 MG tablet Commonly known as:  ULTRAM Take 50 mg by mouth 2 (two) times daily as needed for moderate pain.      Follow-up Information    [name], MD. Schedule an appointment as soon as possible for a visit on 12/06/2016.   Specialty:  Orthopedic Surgery Contact information: [address] [place] [address] [phone]           Signed: Arlee Muslim, PA-C Orthopaedic Surgery 11/24/2016, 8:40 AM

## 2016-11-24 NOTE — Progress Notes (Signed)
Physical Therapy Treatment Patient Details Name: [name] MRN: [id number] DOB: 02-06-1966 Today's Date: 11/24/2016    History of Present Illness revision R posterior THA due to metallosis    PT Comments    Patient is progressing very well. Plans DC todAY AFTER PT.  Follow Up Recommendations  Home health PT     Equipment Recommendations  None recommended by PT    Recommendations for Other Services       Precautions / Restrictions Precautions Precautions: Posterior Hip Restrictions Weight Bearing Restrictions: No RLE Weight Bearing: Weight bearing as tolerated    Mobility  Bed Mobility Overal bed mobility: Needs Assistance Bed Mobility: Supine to Sit     Supine to sit: Min guard     General bed mobility comments: cues for precautions  Transfers Overall transfer level: Needs assistance Equipment used: Rolling walker (2 wheeled) Transfers: Sit to/from Stand Sit to Stand: Supervision         General transfer comment: cues for hand and right leg position, precautions  Ambulation/Gait Ambulation/Gait assistance: Supervision Ambulation Distance (Feet): 140 Feet Assistive device: Standard walker Gait Pattern/deviations: Step-through pattern     General Gait Details: gait is smoothe, not antalgic, cues for sequence   Stairs            Wheelchair Mobility    Modified Rankin (Stroke Patients Only)       Balance                                            Cognition Arousal/Alertness: Awake/alert                                            Exercises      General Comments        Pertinent Vitals/Pain Pain Score: 1  Pain Location: right hip Pain Descriptors / Indicators: Sore Pain Intervention(s): Monitored during session;Premedicated before session;Repositioned;Ice applied    Home Living                      Prior Function            PT Goals (current goals can now be found in the  care plan section) Progress towards PT goals: Progressing toward goals    Frequency    7X/week      PT Plan Current plan remains appropriate    Co-evaluation              AM-PAC PT "6 Clicks" Daily Activity  Outcome Measure  Difficulty turning over in bed (including adjusting bedclothes, sheets and blankets)?: Total Difficulty moving from lying on back to sitting on the side of the bed? : Total Difficulty sitting down on and standing up from a chair with arms (e.g., wheelchair, bedside commode, etc,.)?: Total Help needed moving to and from a bed to chair (including a wheelchair)?: Total Help needed walking in hospital room?: Total Help needed climbing 3-5 steps with a railing? : Total 6 Click Score: 6    End of Session   Activity Tolerance: Patient tolerated treatment well Patient left: in chair;with call bell/phone within reach Nurse Communication: Mobility status PT Visit Diagnosis: Difficulty in walking, not elsewhere classified (R26.2);Pain Pain - Right/Left: Right Pain - part of body: Hip  Time: 0920-0956 PT Time Calculation (min) (ACUTE ONLY): 36 min  Charges:  $Gait Training: 23-37 mins                    G Codes:          Claretha Cooper 11/24/2016, 11:28 AM

## 2016-11-26 DIAGNOSIS — Z96643 Presence of artificial hip joint, bilateral: Secondary | ICD-10-CM | POA: Diagnosis not present

## 2016-11-26 DIAGNOSIS — Z79891 Long term (current) use of opiate analgesic: Secondary | ICD-10-CM | POA: Diagnosis not present

## 2016-11-26 DIAGNOSIS — T5691XD Toxic effect of unspecified metal, accidental (unintentional), subsequent encounter: Secondary | ICD-10-CM | POA: Diagnosis not present

## 2016-11-26 DIAGNOSIS — L4050 Arthropathic psoriasis, unspecified: Secondary | ICD-10-CM | POA: Diagnosis not present

## 2016-11-26 DIAGNOSIS — Z7901 Long term (current) use of anticoagulants: Secondary | ICD-10-CM | POA: Diagnosis not present

## 2016-11-28 ENCOUNTER — Other Ambulatory Visit: Payer: Self-pay | Admitting: *Deleted

## 2016-11-28 NOTE — Patient Outreach (Signed)
[facility]) Care Management  11/28/2016  [name] Jan 04, 1966 [id number]   Subjective: Telephone call to patient's home  / mobile number, no answer, no voicemail, and unable to leave a message.   Objective:Per chart review, patient scheduled to be admitted for Right hip bearing surface versus total hip arthroplasty revision on 11/23/16.  Patient hospitalized  11/23/16 - 11/24/16 for Metallosis, right hip.   Status post Right hip acetabular liner and femoral head revision on 11/23/16.     Assessment:Received UMR Preoperative Call follow up referral on 11/11/16. Preoperative call completed and transition of care follow up pending patient contact.  Plan: RNCM will call patient for 2nd telephone outreach attempt, transition of care follow up, within 10 business days if no return call.    Khadar Monger H. Annia Friendly, BSN, [place] Management [facility] Telephonic CM Phone: [phone] Fax: [phone]

## 2016-11-29 ENCOUNTER — Encounter: Payer: 59 | Admitting: Gastroenterology

## 2016-11-29 ENCOUNTER — Other Ambulatory Visit: Payer: Self-pay | Admitting: *Deleted

## 2016-11-29 ENCOUNTER — Encounter: Payer: Self-pay | Admitting: *Deleted

## 2016-11-29 DIAGNOSIS — Z79891 Long term (current) use of opiate analgesic: Secondary | ICD-10-CM | POA: Diagnosis not present

## 2016-11-29 DIAGNOSIS — T5691XD Toxic effect of unspecified metal, accidental (unintentional), subsequent encounter: Secondary | ICD-10-CM | POA: Diagnosis not present

## 2016-11-29 DIAGNOSIS — L4050 Arthropathic psoriasis, unspecified: Secondary | ICD-10-CM | POA: Diagnosis not present

## 2016-11-29 DIAGNOSIS — Z96643 Presence of artificial hip joint, bilateral: Secondary | ICD-10-CM | POA: Diagnosis not present

## 2016-11-29 DIAGNOSIS — Z7901 Long term (current) use of anticoagulants: Secondary | ICD-10-CM | POA: Diagnosis not present

## 2016-11-29 NOTE — Patient Outreach (Signed)
[facility]) Care Management  11/29/2016  [name] [birth year] [id number]  Subjective:  Telephone call to patient's home / mobile number, spoke with patient, and HIPAA verified.  Discussed [facility] Care Management UMR Transition of care follow up, patient voiced understanding, and is in agreement to follow up.   Patient states he remembers this RNCM from previous call, he is doing well, has set goals to increase number of steps taken each day, has a follow up appointment next with surgeon (does not remember the exact date), and has a follow up appointment with rheumatologist on 12/27/16.  Patient voices understanding of medical diagnosis, surgery,  and treatment plan.  Patient states he has seen information on the [place].  Patient is aware of how to obtain information regarding classes.   He states he will follow up once he is feeling better and will verify it is ok with his MD to participate if appropriate.  Discussed other exercise community resources,  possible [facility] discounts with [employer] and [employer].   Patient voices understanding and states he may  follow up regarding possible [facility] discounts if he decides to pursue community resources in the future.  States he is accessing the following [facility] benefits: outpatient pharmacy and will access [facility] program in the future if appropriate. Patient states he does not have any education material, transition of care, care coordination, disease management, disease monitoring, transportation, community resource, or pharmacy needs at this time. States he is very appreciative of the follow up and is in agreement to receive [place] [facility] information.    Objective:Per chart review, patient scheduled to be admitted for Right hip bearing surface versus total hip arthroplasty revision on 11/23/16.  Patient hospitalized  11/23/16 - 11/24/16 for Metallosis, right hip.   Status post Right hip acetabular  liner and femoral head revision on 11/23/16.     Assessment:Received UMR Preoperative Call follow up referral on 11/11/16. Preoperative call completed on 11/22/16.   Transition of care follow up completed, no care management needs, and will proceed with case closure.   Plan: RNCM will send patient successful outreach letter, [facility] pamphlet, and magnet. RNCM will send case closure due to follow up completed / no care management needs request to [facility] [facility].     Juniper Cobey H. Annia Friendly, BSN, [place] Management [facility] Telephonic CM Phone: [phone] Fax: [phone]

## 2016-12-01 DIAGNOSIS — T5691XD Toxic effect of unspecified metal, accidental (unintentional), subsequent encounter: Secondary | ICD-10-CM | POA: Diagnosis not present

## 2016-12-01 DIAGNOSIS — Z7901 Long term (current) use of anticoagulants: Secondary | ICD-10-CM | POA: Diagnosis not present

## 2016-12-01 DIAGNOSIS — Z96643 Presence of artificial hip joint, bilateral: Secondary | ICD-10-CM | POA: Diagnosis not present

## 2016-12-01 DIAGNOSIS — Z79891 Long term (current) use of opiate analgesic: Secondary | ICD-10-CM | POA: Diagnosis not present

## 2016-12-01 DIAGNOSIS — L4050 Arthropathic psoriasis, unspecified: Secondary | ICD-10-CM | POA: Diagnosis not present

## 2016-12-05 DIAGNOSIS — L4050 Arthropathic psoriasis, unspecified: Secondary | ICD-10-CM | POA: Diagnosis not present

## 2016-12-05 DIAGNOSIS — Z7901 Long term (current) use of anticoagulants: Secondary | ICD-10-CM | POA: Diagnosis not present

## 2016-12-05 DIAGNOSIS — Z96643 Presence of artificial hip joint, bilateral: Secondary | ICD-10-CM | POA: Diagnosis not present

## 2016-12-05 DIAGNOSIS — T5691XD Toxic effect of unspecified metal, accidental (unintentional), subsequent encounter: Secondary | ICD-10-CM | POA: Diagnosis not present

## 2016-12-05 DIAGNOSIS — Z79891 Long term (current) use of opiate analgesic: Secondary | ICD-10-CM | POA: Diagnosis not present

## 2016-12-06 DIAGNOSIS — L4050 Arthropathic psoriasis, unspecified: Secondary | ICD-10-CM | POA: Diagnosis not present

## 2016-12-06 DIAGNOSIS — Z79891 Long term (current) use of opiate analgesic: Secondary | ICD-10-CM | POA: Diagnosis not present

## 2016-12-06 DIAGNOSIS — T5691XD Toxic effect of unspecified metal, accidental (unintentional), subsequent encounter: Secondary | ICD-10-CM | POA: Diagnosis not present

## 2016-12-06 DIAGNOSIS — Z7901 Long term (current) use of anticoagulants: Secondary | ICD-10-CM | POA: Diagnosis not present

## 2016-12-22 NOTE — Progress Notes (Signed)
Office Visit Note  Patient: [name]             Date of Birth: 05-05-1965           MRN: [id number]             PCP: Eulas Post, MD Referring: Eulas Post, MD Visit Date: 12/27/2016 Occupation: @GUAROCC @    Subjective:  Lower back pain.   History of Present Illness: KEIGEN CADDELL is a 51 y.o. male with history of psoriatic arthritis and psoriasis. He been off Enbrel for almost 2 months now. He had surgery for right total hip revision in July 2018. He states he is recovering quite well from the hip replacement. He's been having increased lower back pain. He is not having any joint swelling. His psoriasis has not flared.  Activities of Daily Living:  Patient reports morning stiffness for 30 minutes.   Patient Denies nocturnal pain.  Difficulty dressing/grooming: Denies Difficulty climbing stairs: Reports Difficulty getting out of chair: Reports Difficulty using hands for taps, buttons, cutlery, and/or writing: Denies   Review of Systems  Constitutional: Negative for night sweats and weakness ( ).  HENT: Negative for mouth sores, mouth dryness and nose dryness.   Eyes: Negative for redness and dryness.  Respiratory: Negative for shortness of breath and difficulty breathing.   Cardiovascular: Negative for chest pain, palpitations, hypertension, irregular heartbeat and swelling in legs/feet.  Gastrointestinal: Negative for constipation and diarrhea.  Endocrine: Negative for increased urination.  Genitourinary: Positive for urgency.  Musculoskeletal: Positive for arthralgias, joint pain and morning stiffness. Negative for joint swelling, myalgias, muscle weakness, muscle tenderness and myalgias.  Skin: Negative for color change, rash, hair loss, nodules/bumps, skin tightness, ulcers and sensitivity to sunlight.  Allergic/Immunologic: Negative for susceptible to infections.  Neurological: Negative for dizziness, fainting, memory loss and night sweats.    Hematological: Negative for swollen glands.  Psychiatric/Behavioral: Positive for sleep disturbance. Negative for depressed mood. The patient is not nervous/anxious.     PMFS History:  Patient Active Problem List   Diagnosis Date Noted  . Failed total hip arthroplasty ([place]) 11/23/2016  . High risk medications (not anticoagulants) long-term use 09/20/2016  . Bilateral hand pain 09/20/2016  . DDD (degenerative disc disease), cervical 09/20/2016  . History of total hip replacement, bilateral 09/20/2016  . DDD (degenerative disc disease), lumbar 09/20/2016  . Bilateral plantar fasciitis 09/20/2016  . Psoriasis 09/20/2016  . Primary osteoarthritis of both feet 09/20/2016  . Primary osteoarthritis of both hands 09/20/2016  . Primary osteoarthritis of both knees 09/20/2016  . Prediabetes 09/22/2014  . Hyperlipidemia 09/22/2014  . URI (upper respiratory infection) 07/07/2012  . Acute bronchitis 07/07/2012  . HYPERLIPIDEMIA 07/03/2008  . OBESITY 07/03/2008  . AVASCULAR NECROSIS, FEMORAL HEAD 07/03/2008  . Psoriatic arthritis ([place]) 05/02/2001    Past Medical History:  Diagnosis Date  . History of avascular necrosis of capital femoral epiphysis 2006, 2007   Bilateral Femoral Head  . HLD (hyperlipidemia)   . Psoriatic arthritis ([place])     Family History  Problem Relation Age of Onset  . Pneumonia Father   . Tremor Neg Hx    Past Surgical History:  Procedure Laterality Date  . JOINT REPLACEMENT Bilateral 2006, 2007   Necrosis femoral head (bilateral)  . TOTAL HIP REVISION Right 11/23/2016   Procedure: Acetabulum liner and femoral head revision;  Surgeon: [name], MD;  Location: [facility];  Service: Orthopedics;  Laterality: Right;   Social History  Social History Narrative   Lives at home w/ his wife   Right-handed   Caffeine: 2-3 cups per day     Objective: Vital Signs: BP (!) 164/101 (BP Location: Left Arm, Patient Position: Sitting, Cuff Size: Normal)   Pulse 70    Ht 5\' 9"  (1.753 m)   Wt 243 lb (110.2 kg)   BMI 35.88 kg/m    Physical Exam  Constitutional: He is oriented to person, place, and time. He appears well-developed and well-nourished.  HENT:  Head: Normocephalic and atraumatic.  Eyes: Pupils are equal, round, and reactive to light. Conjunctivae and EOM are normal.  Neck: Normal range of motion. Neck supple.  Cardiovascular: Normal rate, regular rhythm and normal heart sounds.   Pulmonary/Chest: Effort normal and breath sounds normal.  Abdominal: Soft. Bowel sounds are normal.  Neurological: He is alert and oriented to person, place, and time.  Skin: Skin is warm and dry. Capillary refill takes less than 2 seconds.  Psychiatric: He has a normal mood and affect. His behavior is normal.  Nursing note and vitals reviewed.    Musculoskeletal Exam: C-spine and thoracic lumbar spine limited range of motion. Shoulder joints good range of motion. He has contracture in his bilateral elbow joints. Limited range of motion of his wrist joints. He has incomplete fist formation bilaterally. His thickening of PIP/DIP joints but no active synovitis. Right hip replacement has limited range of motion with some discomfort. He joints ankles were good range of motion. He has some thickening of PIP/DIP joints in his feet. Tenderness was noted on palpation of bilateral SI joints.  CDAI Exam: CDAI Homunculus Exam:   Joint Counts:  CDAI Tender Joint count: 0 CDAI Swollen Joint count: 0  Global Assessments:  Patient Global Assessment: 3 Provider Global Assessment: 3  CDAI Calculated Score: 6  CBC Latest Ref Rng & Units 11/24/2016 11/16/2016 09/28/2016  WBC 4.0 - 10.5 K/uL 8.3 5.1 6.1  Hemoglobin 13.0 - 17.0 g/dL 12.5(L) 14.6 15.4  Hematocrit 39.0 - 52.0 % 36.5(L) 43.6 45.4  Platelets 150 - 400 K/uL 135(L) 136(L) 159.0   CMP Latest Ref Rng & Units 11/24/2016 11/16/2016 09/28/2016  Glucose 65 - 99 mg/dL 113(H) 93 91  BUN 6 - 20 mg/dL 17 28(H) 21  Creatinine  0.61 - 1.24 mg/dL 0.91 1.08 1.00  Sodium 135 - 145 mmol/L 139 138 141  Potassium 3.5 - 5.1 mmol/L 4.2 4.4 4.6  Chloride 101 - 111 mmol/L 107 106 106  CO2 22 - 32 mmol/L 24 24 30   Calcium 8.9 - 10.3 mg/dL 8.5(L) 9.0 9.6  Total Protein 6.5 - 8.1 g/dL - 7.4 7.5  Total Bilirubin 0.3 - 1.2 mg/dL - 0.6 0.9  Alkaline Phos 38 - 126 U/L - 80 95  AST 15 - 41 U/L - 22 20  ALT 17 - 63 U/L - 23 22     Investigation: Findings:  July 2017 negative TB gold     Imaging: No results found.  Speciality Comments: No specialty comments available.    Procedures:  No procedures performed Allergies: Gold-containing drug products   Assessment / Plan:     Visit Diagnoses: Psoriatic arthritis ([place]): He's been having increased pain and discomfort since he's been off Enbrel. He's been off Enbrel for 2 months now. He came off Enbrel for his hip replacement surgery but did not resume it. He's been having increase arthralgias especially in the SI joint area.  Psoriasis: He has no active lesions.  High risk medications  long-term use - off of ENBREL since April 2018 - Plan: CBC with Differential/Platelet, COMPLETE METABOLIC PANEL WITH GFR, Quantiferon tb gold assay (blood) today and then we will check labs every 3 months to monitor for drug toxicity.  Primary osteoarthritis of both hands: He has limited range of motion of his bilateral hands incomplete fist formation.  History of total hip replacement, bilateral - 11/23/2016 Revision of right hip by Dr. Maureen Ralphs. He still having some stiffness.  Primary osteoarthritis of both knees: Not much discomfort.  Primary osteoarthritis of both feet: Stiffness  DDD (degenerative disc disease), cervical: He has some discomfort range of motion  DDD (degenerative disc disease), lumbar: Lower back pain persist.  Vitamin D deficiency - Plan: VITAMIN D 25 Hydroxy (Vit-D Deficiency, Fractures)  History of hyperlipidemia  History of prediabetes   Hypertension: His  blood pressure is elevated today have advised him to monitor blood pressure closely and follow up with his PCP.    Orders: Orders Placed This Encounter  Procedures  . CBC with Differential/Platelet  . COMPLETE METABOLIC PANEL WITH GFR  . Quantiferon tb gold assay (blood)  . VITAMIN D 25 Hydroxy (Vit-D Deficiency, Fractures)   No orders of the defined types were placed in this encounter.   Face-to-face time spent with patient was 15minutes. Greater than 50% of time was spent in counseling and coordination of care.  Follow-Up Instructions: Return in about 5 months (around 05/29/2017) for Psoriatic arthritis, Osteoarthritis.   Bo Merino, MD  Note - This record has been created using Editor, commissioning.  Chart creation errors have been sought, but may not always  have been located. Such creation errors do not reflect on  the standard of medical care.

## 2016-12-27 ENCOUNTER — Encounter: Payer: Self-pay | Admitting: Rheumatology

## 2016-12-27 ENCOUNTER — Ambulatory Visit (INDEPENDENT_AMBULATORY_CARE_PROVIDER_SITE_OTHER): Payer: 59 | Admitting: Rheumatology

## 2016-12-27 VITALS — BP 164/101 | HR 70 | Ht 69.0 in | Wt 243.0 lb

## 2016-12-27 DIAGNOSIS — L4050 Arthropathic psoriasis, unspecified: Secondary | ICD-10-CM | POA: Diagnosis not present

## 2016-12-27 DIAGNOSIS — Z79899 Other long term (current) drug therapy: Secondary | ICD-10-CM | POA: Diagnosis not present

## 2016-12-27 DIAGNOSIS — M5136 Other intervertebral disc degeneration, lumbar region: Secondary | ICD-10-CM

## 2016-12-27 DIAGNOSIS — M19071 Primary osteoarthritis, right ankle and foot: Secondary | ICD-10-CM

## 2016-12-27 DIAGNOSIS — M19042 Primary osteoarthritis, left hand: Secondary | ICD-10-CM

## 2016-12-27 DIAGNOSIS — M170 Bilateral primary osteoarthritis of knee: Secondary | ICD-10-CM

## 2016-12-27 DIAGNOSIS — Z96643 Presence of artificial hip joint, bilateral: Secondary | ICD-10-CM | POA: Diagnosis not present

## 2016-12-27 DIAGNOSIS — Z471 Aftercare following joint replacement surgery: Secondary | ICD-10-CM | POA: Diagnosis not present

## 2016-12-27 DIAGNOSIS — E559 Vitamin D deficiency, unspecified: Secondary | ICD-10-CM | POA: Diagnosis not present

## 2016-12-27 DIAGNOSIS — L409 Psoriasis, unspecified: Secondary | ICD-10-CM

## 2016-12-27 DIAGNOSIS — Z8639 Personal history of other endocrine, nutritional and metabolic disease: Secondary | ICD-10-CM

## 2016-12-27 DIAGNOSIS — Z87898 Personal history of other specified conditions: Secondary | ICD-10-CM

## 2016-12-27 DIAGNOSIS — M19041 Primary osteoarthritis, right hand: Secondary | ICD-10-CM | POA: Diagnosis not present

## 2016-12-27 DIAGNOSIS — M19072 Primary osteoarthritis, left ankle and foot: Secondary | ICD-10-CM

## 2016-12-27 DIAGNOSIS — M51369 Other intervertebral disc degeneration, lumbar region without mention of lumbar back pain or lower extremity pain: Secondary | ICD-10-CM

## 2016-12-27 DIAGNOSIS — M5030 Other cervical disc degeneration, unspecified cervical region: Secondary | ICD-10-CM | POA: Diagnosis not present

## 2016-12-27 DIAGNOSIS — Z96641 Presence of right artificial hip joint: Secondary | ICD-10-CM | POA: Diagnosis not present

## 2016-12-27 LAB — CBC WITH DIFFERENTIAL/PLATELET
Basophils Absolute: 0 cells/uL (ref 0–200)
Basophils Relative: 0 %
EOS PCT: 2 %
Eosinophils Absolute: 92 cells/uL (ref 15–500)
HCT: 45.2 % (ref 38.5–50.0)
HEMOGLOBIN: 14.8 g/dL (ref 13.2–17.1)
LYMPHS ABS: 1380 {cells}/uL (ref 850–3900)
Lymphocytes Relative: 30 %
MCH: 30 pg (ref 27.0–33.0)
MCHC: 32.7 g/dL (ref 32.0–36.0)
MCV: 91.7 fL (ref 80.0–100.0)
MONOS PCT: 5 %
MPV: 11 fL (ref 7.5–12.5)
Monocytes Absolute: 230 cells/uL (ref 200–950)
NEUTROS ABS: 2898 {cells}/uL (ref 1500–7800)
Neutrophils Relative %: 63 %
PLATELETS: 141 10*3/uL (ref 140–400)
RBC: 4.93 MIL/uL (ref 4.20–5.80)
RDW: 14.7 % (ref 11.0–15.0)
WBC: 4.6 10*3/uL (ref 3.8–10.8)

## 2016-12-27 NOTE — Patient Instructions (Addendum)
Standing Labs We placed an order today for your standing lab work.    Please come back and get your standing labs in November and every 3 months  We have open lab Monday through Friday from 8:30-11:30 AM and 1:30-4 PM at the office of Dr. Bo Merino.   The office is located at [address], [place], [place],  [address] No appointment is necessary.   Labs are drawn by [facility].  You may receive a bill from [facility] for your lab work. If you have any questions regarding directions or hours of operation,  please call [phone].   Natural anti-inflammatories  You can purchase these at [employer], [employer] or online.  . Turmeric (capsules)  . Ginger (ginger root or capsules)  . Omega 3 (Fish, flax seeds, chia seeds, walnuts, almonds)  . Tart cherry (dried or extract)   Patient should be under the care of a physician while taking these supplements. This may not be reproduced without the permission of Dr. Bo Merino.

## 2016-12-28 LAB — COMPLETE METABOLIC PANEL WITH GFR
ALBUMIN: 4.4 g/dL (ref 3.6–5.1)
ALK PHOS: 98 U/L (ref 40–115)
ALT: 21 U/L (ref 9–46)
AST: 17 U/L (ref 10–35)
BUN: 21 mg/dL (ref 7–25)
CO2: 24 mmol/L (ref 20–32)
Calcium: 9.3 mg/dL (ref 8.6–10.3)
Chloride: 103 mmol/L (ref 98–110)
Creat: 1.06 mg/dL (ref 0.70–1.33)
GFR, Est African American: >89 mL/min (ref >=60)
GFR, Est Non African American: 80 mL/min (ref >=60)
GLUCOSE: 85 mg/dL (ref 65–99)
POTASSIUM: 4.4 mmol/L (ref 3.5–5.3)
SODIUM: 137 mmol/L (ref 135–146)
TOTAL PROTEIN: 7.2 g/dL (ref 6.1–8.1)
Total Bilirubin: 0.7 mg/dL (ref 0.2–1.2)

## 2016-12-28 LAB — VITAMIN D 25 HYDROXY (VIT D DEFICIENCY, FRACTURES): VIT D 25 HYDROXY: 44 ng/mL (ref 30–100)

## 2016-12-29 ENCOUNTER — Other Ambulatory Visit: Payer: Self-pay

## 2016-12-29 ENCOUNTER — Other Ambulatory Visit (INDEPENDENT_AMBULATORY_CARE_PROVIDER_SITE_OTHER): Payer: 59

## 2016-12-29 ENCOUNTER — Telehealth: Payer: Self-pay | Admitting: Family Medicine

## 2016-12-29 ENCOUNTER — Encounter: Payer: Self-pay | Admitting: Family Medicine

## 2016-12-29 DIAGNOSIS — E559 Vitamin D deficiency, unspecified: Secondary | ICD-10-CM | POA: Diagnosis not present

## 2016-12-29 LAB — VITAMIN D 25 HYDROXY (VIT D DEFICIENCY, FRACTURES): VITD: 40.18 ng/mL (ref 30.00–100.00)

## 2016-12-29 NOTE — Telephone Encounter (Signed)
The patient came in for labs today and wanted to let his Dr know that his BP has been 161/101 and stagnant the last few months. Please Advise

## 2016-12-29 NOTE — Telephone Encounter (Signed)
I called the pt and informed her of the message below and offered an appt for tomorrow with Dr [name].  Patient declined an appt as he stated he is going to the beach.  Appt scheduled for 9/4.

## 2016-12-29 NOTE — Telephone Encounter (Signed)
Please schedule appointment with his PCP.

## 2016-12-30 LAB — QUANTIFERON TB GOLD ASSAY (BLOOD)
Interferon Gamma Release Assay: NEGATIVE
QUANTIFERON TB AG MINUS NIL: 0.07 [IU]/mL
Quantiferon Nil Value: 0.12 IU/mL

## 2017-01-03 ENCOUNTER — Ambulatory Visit (INDEPENDENT_AMBULATORY_CARE_PROVIDER_SITE_OTHER): Payer: 59 | Admitting: Family Medicine

## 2017-01-03 ENCOUNTER — Encounter: Payer: Self-pay | Admitting: Family Medicine

## 2017-01-03 VITALS — BP 154/88 | HR 78 | Temp 98.9°F | Wt 243.8 lb

## 2017-01-03 DIAGNOSIS — R030 Elevated blood-pressure reading, without diagnosis of hypertension: Secondary | ICD-10-CM

## 2017-01-03 NOTE — Progress Notes (Signed)
Subjective:     Patient ID: [name], male   DOB: [birth year], 52 y.o.   MRN: [id number]  HPI Patient here to discuss elevated blood pressure. He had recent hip surgery and also has history of psoriatic arthritis and this has limited his activities. Even taking Enbrel for psoriatic arthritis he developed some tremors. He thinks that Cobalt from his hip surgery may be somehow related to his tremors. He had recent evaluation per neurology and they did not feel he had any Parkinson's issues. He had recent blood pressure at home 154/111. His wife is an Therapist, sports and she has checked his blood pressure a few times. They apparently are using automated cuff though. He denies any headaches. No chest pains. He states he feels better than he has in quite some time.  No alcohol use. No nonsteroidal use.  Past Medical History:  Diagnosis Date  . History of avascular necrosis of capital femoral epiphysis 2006, 2007   Bilateral Femoral Head  . HLD (hyperlipidemia)   . Psoriatic arthritis [facility])    Past Surgical History:  Procedure Laterality Date  . JOINT REPLACEMENT Bilateral 2006, 2007   Necrosis femoral head (bilateral)  . TOTAL HIP REVISION Right 11/23/2016   Procedure: Acetabulum liner and femoral head revision;  Surgeon: [name], MD;  Location: [facility];  Service: Orthopedics;  Laterality: Right;    reports that he has never smoked. He has never used smokeless tobacco. He reports that he drinks alcohol. He reports that he does not use drugs. family history includes Pneumonia in his father. Allergies  Allergen Reactions  . Gold-Containing Drug Products Rash    Denies oral or airway involvement - occurred in the 1980s.      Review of Systems  Constitutional: Negative for fatigue.  Eyes: Negative for visual disturbance.  Respiratory: Negative for cough, chest tightness and shortness of breath.   Cardiovascular: Negative for chest pain, palpitations and leg swelling.  Neurological: Negative for  dizziness, syncope, weakness, light-headedness and headaches.       Objective:   Physical Exam  Constitutional: He is oriented to person, place, and time. He appears well-developed and well-nourished.  HENT:  Right Ear: External ear normal.  Left Ear: External ear normal.  Mouth/Throat: Oropharynx is clear and moist.  Eyes: Pupils are equal, round, and reactive to light.  Neck: Neck supple. No thyromegaly present.  Cardiovascular: Normal rate and regular rhythm.   Pulmonary/Chest: Effort normal and breath sounds normal. No respiratory distress. He has no wheezes. He has no rales.  Musculoskeletal: He exhibits no edema.  Neurological: He is alert and oriented to person, place, and time.       Assessment:     Elevated blood pressure without prior diagnosis of hypertension. Repeat left arm seated after rest 154/88 and similar reading right arm    Plan:     -We recommend he try to lose some weight -Keep sodium intake less than 2000 mg daily -Continue close monitoring of blood pressure -Reassess here in one month and bring his cuff with him along with some home readings. If not further improved that point consider initiation of blood pressure medication  Eulas Post MD [facility]

## 2017-01-03 NOTE — Patient Instructions (Signed)
Try to lose some weight Keep sodium intake < 2 grams per day Continue with regular exercise Our goal BP is < 130/80 Let's plan on 3 month follow up and bring your cuff in then to compare with ours.

## 2017-01-17 DIAGNOSIS — Z0101 Encounter for examination of eyes and vision with abnormal findings: Secondary | ICD-10-CM | POA: Diagnosis not present

## 2017-01-19 ENCOUNTER — Encounter: Payer: Self-pay | Admitting: Family Medicine

## 2017-02-03 ENCOUNTER — Other Ambulatory Visit: Payer: Self-pay | Admitting: Rheumatology

## 2017-02-03 ENCOUNTER — Encounter: Payer: Self-pay | Admitting: Family Medicine

## 2017-02-03 ENCOUNTER — Ambulatory Visit (INDEPENDENT_AMBULATORY_CARE_PROVIDER_SITE_OTHER): Payer: 59 | Admitting: Family Medicine

## 2017-02-03 DIAGNOSIS — R030 Elevated blood-pressure reading, without diagnosis of hypertension: Secondary | ICD-10-CM

## 2017-02-03 DIAGNOSIS — Z23 Encounter for immunization: Secondary | ICD-10-CM

## 2017-02-03 NOTE — Progress Notes (Signed)
Subjective:     Patient ID: [name], male   DOB: Sep 13, 1965, 51 y.o.   MRN: [id number]  HPI Patient seen for follow-up regarding elevated blood pressure reading. He has done tremendous job with scaling back sugars and starches and has lost about 6 pounds already. He brings in home blood pressure cuff and is gotten several readings still running 166M systolic and 80 diastolic. He has had some readings in the 130s. No headaches. No chest pains. No dizziness. Plans to start more consistent aerobic exercise.  Past Medical History:  Diagnosis Date  . History of avascular necrosis of capital femoral epiphysis 2006, 2007   Bilateral Femoral Head  . HLD (hyperlipidemia)   . Psoriatic arthritis [facility])    Past Surgical History:  Procedure Laterality Date  . JOINT REPLACEMENT Bilateral 2006, 2007   Necrosis femoral head (bilateral)  . TOTAL HIP REVISION Right 11/23/2016   Procedure: Acetabulum liner and femoral head revision;  Surgeon: [name], MD;  Location: [facility];  Service: Orthopedics;  Laterality: Right;    reports that he has never smoked. He has never used smokeless tobacco. He reports that he drinks alcohol. He reports that he does not use drugs. family history includes Pneumonia in his father. Allergies  Allergen Reactions  . Gold-Containing Drug Products Rash    Denies oral or airway involvement - occurred in the 1980s.      Review of Systems  Constitutional: Negative for fatigue.  Eyes: Negative for visual disturbance.  Respiratory: Negative for cough, chest tightness and shortness of breath.   Cardiovascular: Negative for chest pain, palpitations and leg swelling.  Endocrine: Negative for polydipsia and polyuria.  Neurological: Negative for dizziness, syncope, weakness, light-headedness and headaches.       Objective:   Physical Exam  Constitutional: He appears well-developed and well-nourished.  Cardiovascular: Normal rate and regular rhythm.   Pulmonary/Chest:  Effort normal and breath sounds normal. No respiratory distress. He has no wheezes. He has no rales.  Musculoskeletal: He exhibits no edema.       Assessment:     Elevated blood pressure. Greatly improved today. Last week was up in the 600K systolic and today repeat left arm seated large cuff turned 30/78    Plan:     -Flu vaccine given -We recommended continue lifestyle modification with weight loss, regular aerobic exercise, and watch his sodium intake and reassess blood pressure 2 months  Eulas Post MD [facility]

## 2017-02-03 NOTE — Patient Instructions (Signed)
Continue with weight loss efforts.  You are doing great and I would like to give this another 2 months. Continue with regular exercise.

## 2017-02-06 NOTE — Telephone Encounter (Signed)
Last Visit: 12/27/16 Next Visit: 06/06/17 Labs: 12/27/16 WNL TB Gold:  12/27/16 Neg  Okay to refill per Dr. Estanislado Pandy

## 2017-02-07 ENCOUNTER — Other Ambulatory Visit: Payer: Self-pay | Admitting: Pharmacist

## 2017-02-07 ENCOUNTER — Encounter: Payer: Self-pay | Admitting: Pharmacist

## 2017-02-07 MED ORDER — ETANERCEPT 50 MG/ML ~~LOC~~ SOSY: 50.0000 mg | PREFILLED_SYRINGE | SUBCUTANEOUS | 1 refills | Status: DC

## 2017-02-07 MED FILL — ENBREL 50 MG/ML SYRINGE: 50 | 28 days supply | Qty: 4 | Fill #0

## 2017-02-07 NOTE — Progress Notes (Signed)
   S: Patient presents today to the [facility].  Patient is currently taking Enbrel for psoriatic arthritis. Patient is managed by Dr. Estanislado Pandy for this.   Adherence: he was off Enbrel over the summer due to surgery.   Dosing: Enbrel 50 mg weekly  Drug-drug interactions: none  Screening: TB test: completed per patient (negative) Hepatitis: completed per patient  Monitoring: S/sx of infection: denies CBC: done q3 months, all have been normal S/sx of hypersensitivity: denies S/sx of malignancy: denies S/sx of heart failure: denies    O:     Lab Results  Component Value Date   WBC 4.6 12/27/2016   HGB 14.8 12/27/2016   HCT 45.2 12/27/2016   MCV 91.7 12/27/2016   PLT 141 12/27/2016      Chemistry      Component Value Date/Time   NA 137 12/27/2016 0935   K 4.4 12/27/2016 0935   CL 103 12/27/2016 0935   CO2 24 12/27/2016 0935   BUN 21 12/27/2016 0935   CREATININE 1.06 12/27/2016 0935      Component Value Date/Time   CALCIUM 9.3 12/27/2016 0935   ALKPHOS 98 12/27/2016 0935   AST 17 12/27/2016 0935   ALT 21 12/27/2016 0935   BILITOT 0.7 12/27/2016 0935       A/P: 1. Medication review: patient on Enbrel for psoriatic arthritis and has restarted it since being off of it over the summer due to surgery. No questions or concerns about medication. Reviewed the medication with him, including the increase risk of infection, the need for close monitoring of lab work, the possible increased risk of malignancy, and the risk of injection site reactions. No recommendations for changes. See labs above.   Christella Hartigan, PharmD, BCPS, BCACP, [place] [facility] [phone]

## 2017-02-14 ENCOUNTER — Telehealth: Payer: Self-pay | Admitting: Family Medicine

## 2017-02-14 NOTE — Telephone Encounter (Signed)
Pt states cologuard is covered by his insurance and he would like to proceed

## 2017-02-15 NOTE — Telephone Encounter (Signed)
Order faxed and confirmed.

## 2017-02-22 DIAGNOSIS — Z1211 Encounter for screening for malignant neoplasm of colon: Secondary | ICD-10-CM | POA: Diagnosis not present

## 2017-02-22 DIAGNOSIS — Z1212 Encounter for screening for malignant neoplasm of rectum: Secondary | ICD-10-CM | POA: Diagnosis not present

## 2017-02-22 LAB — COLOGUARD: COLOGUARD: NEGATIVE

## 2017-03-03 ENCOUNTER — Telehealth: Payer: Self-pay

## 2017-03-03 LAB — COLOGUARD: Cologuard: NEGATIVE

## 2017-03-03 NOTE — Telephone Encounter (Signed)
Received a message from [place] stating that patient has not had a refill of Enbrel since 09/02/16 and recently received a refill to restart the medication on 02/07/17. Please advise. Thank you!  Lanika Colgate, [place], CPhT 10:24 AM

## 2017-03-03 NOTE — Telephone Encounter (Signed)
Attempted to contact the patient and left message for patient to call the office. Need to check with patient as he has has been off Enbrel for 5 months and recently restarted it on his own at home.

## 2017-03-06 MED FILL — ENBREL 50 MG/ML SYRINGE: 50 | 28 days supply | Qty: 4 | Fill #1

## 2017-03-07 ENCOUNTER — Encounter: Payer: Self-pay | Admitting: *Deleted

## 2017-03-07 ENCOUNTER — Encounter: Payer: Self-pay | Admitting: Family Medicine

## 2017-03-13 ENCOUNTER — Encounter: Payer: Self-pay | Admitting: Family Medicine

## 2017-03-13 NOTE — Telephone Encounter (Signed)
Spoke with patient and he states he has restarted the Enbrel and he is doing well. Patient states he has had no problems from restarting the Enbrel .

## 2017-04-03 ENCOUNTER — Ambulatory Visit (INDEPENDENT_AMBULATORY_CARE_PROVIDER_SITE_OTHER): Payer: 59 | Admitting: Neurology

## 2017-04-03 ENCOUNTER — Other Ambulatory Visit: Payer: Self-pay | Admitting: *Deleted

## 2017-04-03 ENCOUNTER — Other Ambulatory Visit: Payer: Self-pay

## 2017-04-03 ENCOUNTER — Encounter: Payer: Self-pay | Admitting: Neurology

## 2017-04-03 VITALS — BP 148/88 | HR 62 | Ht 69.0 in | Wt 243.0 lb

## 2017-04-03 DIAGNOSIS — Z79899 Other long term (current) drug therapy: Secondary | ICD-10-CM | POA: Diagnosis not present

## 2017-04-03 DIAGNOSIS — G250 Essential tremor: Secondary | ICD-10-CM | POA: Insufficient documentation

## 2017-04-03 NOTE — Progress Notes (Signed)
[facility]    Provider:  Dr Jaynee Eagles Referring Provider: Eulas Post, MD Primary Care Physician:  Eulas Post, MD  CC:  Tremor  Interval history 04/03/2017: Patient is here for follow-up of tremors, differential included essential tremor, caffeine related tremor or medication related.  Asked him to stop caffeine, thyroid normal, here for follow-up today. His tremor is better since he had surgery because his Cobalt level was high.He still has a tremor however, Tremor is fine, doesn't bother him happens when drinking water, not resting, happens with action. He is still drinking lots of coffee however.   HPI:  [name] is a 51 y.o. male here as a referral from Dr. [name] for tremors. Patient has a past medical history of avascular necrosis, hyperlipidemia, psoriatic arthritis on Enbrel. He has been on Enbrel for 4 years. He was shaking a little bit and didn;t think much about it but his Doristine Bosworth mentioned Parkinson's Disease. No resting tremor. He sees it in his hands mostly with action like when eating or holding water. Tremors are stable, not worsening. No FHx of tremors or Parkinson's disease. Not sure when the tremors started, maybe in the last year. No resting tremor. He drinks a lot of coffee 2-3 cups a day but unclear if this makes it worse. No other caffeinated beverages. Nothing makes it better. Most of the time it is just minor he is here just to make sure he does not have Parkinson's disease. No memory changes, he does feel a little "cloudy" sometimes but nothing significant. No other focal neurologic deficits, associated symptoms, inciting events or modifiable factors.  Reviewed notes, labs and imaging from outside physicians, which showed:  TSH wnl  Patient is seen by Dr. Patrecia Pour in rheumatology for psoriatic arthritis. He has mild synovitis.  Personally reviewed MRI images of the cervical spine from 2012 images and agree with the  following:  Findings: Vertebral body height and signal are unremarkable. 0.2 cm of anterolisthesis of C4 on C5 due to facet degenerative disease is noted. The craniocervical junction is normal and cervical cord signal is normal. Visualized paraspinous structures are unremarkable.  C2-3: Negative.  C3-4: There is partial autologous fusion across the disc interspace. The facets are ankylosed. Central canal and foramina are widely patent.  C4-5: The patient has left worse than right facet degenerative disease. The disc is uncovered without bulge or protrusion. Central canal and foramina remain open.  C5-6: There is a disc osteophyte complex and right worse than left uncovertebral disease. Facet arthropathy is present with some marrow edema about the joint. The ventral thecal sac is nearly effaced. Mild left and moderate right foraminal narrowing is identified.  C6-7: Disc bulge and uncovertebral disease without central canal narrowing. Mild to moderate foraminal narrowing appears worse on the left.  C7-T1: There is an annular tear and shallow left paracentral protrusion but the central canal and foramina remain open.  IMPRESSION:  1. Moderate right and mild left foraminal narrowing C5-6 where a disc bulge nearly effaces the ventral thecal sac. Facet arthropathy with mild marrow edema about the left facet joint noted. 2. Mild to moderate foraminal narrowing C6-7, worse on the left. 3. 0.2 cm anterolisthesis of C4 on C5 due to facet degenerative disease without central canal or foraminal narrowing.  Review of Systems: Patient complains of symptoms per HPI as well as the following symptoms: Tremors, joint pain, joint swelling, back pain, aching muscles, muscle cramps, neck pain, neck stiffness. Pertinent negatives and positives  per HPI. All others negative.     Social History   Socioeconomic History  . Marital status: Married    Spouse name:  Not on file  . Number of children: 1  . Years of education: Not on file  . Highest education level: Not on file  Social Needs  . Financial resource strain: Not on file  . Food insecurity - worry: Not on file  . Food insecurity - inability: Not on file  . Transportation needs - medical: Not on file  . Transportation needs - non-medical: Not on file  Occupational History  . Occupation: Disabled  Tobacco Use  . Smoking status: Never Smoker  . Smokeless tobacco: Never Used  Substance and Sexual Activity  . Alcohol use: Yes    Alcohol/week: 0.0 oz    Comment: 3-4 drinks per week  . Drug use: No  . Sexual activity: Not on file    Comment: Married  Other Topics Concern  . Not on file  Social History Narrative   Lives at home w/ his wife   Right-handed   Caffeine: 2-3 cups per day    Family History  Problem Relation Age of Onset  . Pneumonia Father   . Tremor Neg Hx     Past Medical History:  Diagnosis Date  . History of avascular necrosis of capital femoral epiphysis 2006, 2007   Bilateral Femoral Head  . HLD (hyperlipidemia)   . Psoriatic arthritis [facility])     Past Surgical History:  Procedure Laterality Date  . JOINT REPLACEMENT Bilateral 2006, 2007   Necrosis femoral head (bilateral)  . TOTAL HIP REVISION Right 11/23/2016   Procedure: Acetabulum liner and femoral head revision;  Surgeon: [name], MD;  Location: [facility];  Service: Orthopedics;  Laterality: Right;    Current Outpatient Medications  Medication Sig Dispense Refill  . acetaminophen (TYLENOL) 500 MG tablet Take 500-1,000 mg by mouth 3 (three) times daily as needed for mild pain.     . calcium-vitamin D (OSCAL WITH D) 500-200 MG-UNIT tablet Take 1 tablet by mouth.    . cholecalciferol (VITAMIN D) 1000 units tablet Take 1,000 Units by mouth daily.    [name] co-enzyme Q-10 30 MG capsule Take 30 mg by mouth daily.    [name] etanercept (ENBREL) 50 MG/ML injection Inject 0.98 mLs (50 mg total) into the skin once a  week. 4 Syringe 1  . ibuprofen (ADVIL,MOTRIN) 200 MG tablet Take 200 mg by mouth every 8 (eight) hours as needed.    . TURMERIC PO Take by mouth.    . vitamin B-12 (CYANOCOBALAMIN) 100 MCG tablet Take 100 mcg by mouth daily.    . Vitamin D, Ergocalciferol, (DRISDOL) 50000 units CAPS capsule Take 50,000 Units by mouth daily.  0   No current facility-administered medications for this visit.     Allergies as of 04/03/2017 - Review Complete 04/03/2017  Allergen Reaction Noted  . Gold-containing drug products Rash 05/03/1983    Vitals: Ht 5\' 9"  (1.753 m)   Wt 243 lb (110.2 kg)   BMI 35.88 kg/m  Last Weight:  Wt Readings from Last 1 Encounters:  04/03/17 243 lb (110.2 kg)   Last Height:   Ht Readings from Last 1 Encounters:  04/03/17 5\' 9"  (1.753 m)     Stable tremor, mostly left, positional and action, no resting tremor    Assessment/Plan:  51 year old male with minimal postural tremor. No parkinsonian signs pon exam. Tremor is likely medication related, essential tremor  also he drinks much caffeine daily. Recommend following clinically.    -Essential tremor is improved or stable.  Discussed there is possibly also contributions from his daily caffeine use.  Reviewed essential tremor, thought to be a benign tremor.  Discussed possibilities of medication even propranolol 10 mg as needed.  At this time patient feels the tremor is not bothering him, not progressive, we would just continue to watch it closely.  Follow-up as needed.  Cc:  Eulas Post, MD, [facility] rheumatology Dr. Joslyn Devon, MD  [facility] [address] [place], [place] [address]  Phone [phone] Fax [phone]  A total of 15 minutes was spent face-to-face with this patient. Over half this time was spent on counseling patient on the tremor diagnosis and different diagnostic and therapeutic options available.

## 2017-04-03 NOTE — Patient Instructions (Signed)
Essential Tremor A tremor is trembling or shaking that you cannot control. Most tremors affect the hands or arms. Tremors can also affect the head, vocal cords, face, and other parts of the body. Essential tremor is a tremor without a known cause. What are the causes? Essential tremor has no known cause. What increases the risk? You may be at greater risk of essential tremor if:  You have a family member with essential tremor.  You are age 53 or older.  You take certain medicines.  What are the signs or symptoms? The main sign of a tremor is uncontrolled and unintentional rhythmic shaking of a body part.  You may have difficulty eating with a spoon or fork.  You may have difficulty writing.  You may nod your head up and down or side to side.  You may have a quivering voice.  Your tremors:  May get worse over time.  May come and go.  May be more noticeable on one side of your body.  May get worse due to stress, fatigue, caffeine, and extreme heat or cold.  How is this diagnosed? Your health care provider can diagnose essential tremor based on your symptoms, medical history, and a physical examination. There is no single test to diagnose an essential tremor. However, your health care provider may perform a variety of tests to rule out other conditions. Tests may include:  Blood and urine tests.  Imaging studies of your brain, such as: ? CT scan. ? MRI.  A test that measures involuntary muscle movement (electromyogram).  How is this treated? Your tremors may go away without treatment. Mild tremors may not need treatment if they do not affect your day-to-day life. Severe tremors may need to be treated using one or a combination of the following options:  Medicines. This may include medicine that is injected.  Lifestyle changes.  Physical therapy.  Follow these instructions at home:  Take medicines only as directed by your health care provider.  Limit alcohol  intake to no more than 1 drink per day for nonpregnant women and 2 drinks per day for men. One drink equals 12 oz of beer, 5 oz of wine, or 1 oz of hard liquor.  Do not use any tobacco products, including cigarettes, chewing tobacco, or electronic cigarettes. If you need help quitting, ask your health care provider.  Take medicines only as directed by your health care provider.  Avoid extreme heat or cold.  Limit the amount of caffeine you consumeas directed by your health care provider.  Try to get eight hours of sleep each night.  Find ways to manage your stress, such as meditation or yoga.  Keep all follow-up visits as directed by your health care provider. This is important. This includes any physical therapy visits. Contact a health care provider if:  You experience any changes in the location or intensity of your tremors.  You start having a tremor after starting a new medicine.  You have tremor with other symptoms such as: ? Numbness. ? Tingling. ? Pain. ? Weakness.  Your tremor gets worse.  Your tremor interferes with your daily life. This information is not intended to replace advice given to you by your health care provider. Make sure you discuss any questions you have with your health care provider. Document Released: 05/09/2014 Document Revised: 09/24/2015 Document Reviewed: 10/14/2013 Elsevier Interactive Patient Education  2018 Elsevier Inc.  

## 2017-04-04 LAB — CBC WITH DIFFERENTIAL/PLATELET
BASOS PCT: 0.4 %
Basophils Absolute: 18 cells/uL (ref 0–200)
EOS PCT: 1.5 %
Eosinophils Absolute: 68 cells/uL (ref 15–500)
HCT: 46.7 % (ref 38.5–50.0)
Hemoglobin: 15.8 g/dL (ref 13.2–17.1)
Lymphs Abs: 1724 cells/uL (ref 850–3900)
MCH: 30.4 pg (ref 27.0–33.0)
MCHC: 33.8 g/dL (ref 32.0–36.0)
MCV: 89.8 fL (ref 80.0–100.0)
MONOS PCT: 5.3 %
MPV: 10.9 fL (ref 7.5–12.5)
NEUTROS PCT: 54.5 %
Neutro Abs: 2453 cells/uL (ref 1500–7800)
PLATELETS: 139 10*3/uL — AB (ref 140–400)
RBC: 5.2 10*6/uL (ref 4.20–5.80)
RDW: 13.6 % (ref 11.0–15.0)
TOTAL LYMPHOCYTE: 38.3 %
WBC: 4.5 10*3/uL (ref 3.8–10.8)
WBCMIX: 239 {cells}/uL (ref 200–950)

## 2017-04-04 LAB — COMPLETE METABOLIC PANEL WITH GFR
AG RATIO: 1.7 (calc) (ref 1.0–2.5)
ALBUMIN MSPROF: 4.3 g/dL (ref 3.6–5.1)
ALKALINE PHOSPHATASE (APISO): 70 U/L (ref 40–115)
ALT: 32 U/L (ref 9–46)
AST: 22 U/L (ref 10–35)
BILIRUBIN TOTAL: 0.6 mg/dL (ref 0.2–1.2)
BUN: 23 mg/dL (ref 7–25)
CHLORIDE: 105 mmol/L (ref 98–110)
CO2: 28 mmol/L (ref 20–32)
Calcium: 9.9 mg/dL (ref 8.6–10.3)
Creat: 1.08 mg/dL (ref 0.70–1.33)
GFR, Est African American: 90 mL/min/{1.73_m2} (ref >=60)
GFR, Est Non African American: 78 mL/min/{1.73_m2} (ref >=60)
GLOBULIN: 2.6 g/dL (ref 1.9–3.7)
GLUCOSE: 98 mg/dL (ref 65–99)
POTASSIUM: 5.2 mmol/L (ref 3.5–5.3)
SODIUM: 139 mmol/L (ref 135–146)
Total Protein: 6.9 g/dL (ref 6.1–8.1)

## 2017-04-04 NOTE — Progress Notes (Signed)
stable °

## 2017-04-05 ENCOUNTER — Other Ambulatory Visit: Payer: Self-pay | Admitting: *Deleted

## 2017-04-05 ENCOUNTER — Ambulatory Visit: Payer: 59 | Admitting: Family Medicine

## 2017-04-05 ENCOUNTER — Ambulatory Visit (INDEPENDENT_AMBULATORY_CARE_PROVIDER_SITE_OTHER): Payer: 59 | Admitting: Family Medicine

## 2017-04-05 ENCOUNTER — Encounter: Payer: Self-pay | Admitting: Family Medicine

## 2017-04-05 VITALS — BP 102/64 | HR 70 | Temp 98.5°F | Wt 241.8 lb

## 2017-04-05 DIAGNOSIS — R030 Elevated blood-pressure reading, without diagnosis of hypertension: Secondary | ICD-10-CM

## 2017-04-05 MED ORDER — ETANERCEPT 50 MG/ML ~~LOC~~ SOSY: 50.0000 mg | PREFILLED_SYRINGE | SUBCUTANEOUS | 2 refills | Status: DC

## 2017-04-05 NOTE — Progress Notes (Signed)
Subjective:     Patient ID: [name], male   DOB: 1966/03/13, 51 y.o.   MRN: [id number]  HPI Patient for follow-up elevated blood pressure. Borderline elevations and we had recommended follow-up to reassess. He's lost a few pounds by his home scales and feels good overall. No headaches. No dizziness. No alcohol use.   No regular nonsteroidal use  Past Medical History:  Diagnosis Date  . History of avascular necrosis of capital femoral epiphysis 2006, 2007   Bilateral Femoral Head  . HLD (hyperlipidemia)   . Psoriatic arthritis [facility])    Past Surgical History:  Procedure Laterality Date  . JOINT REPLACEMENT Bilateral 2006, 2007   Necrosis femoral head (bilateral)  . TOTAL HIP REVISION Right 11/23/2016   Procedure: Acetabulum liner and femoral head revision;  Surgeon: [name], MD;  Location: [facility];  Service: Orthopedics;  Laterality: Right;    reports that  has never smoked. he has never used smokeless tobacco. He reports that he drinks alcohol. He reports that he does not use drugs. family history includes Pneumonia in his father. Allergies  Allergen Reactions  . Gold-Containing Drug Products Rash    Denies oral or airway involvement - occurred in the 1980s.      Review of Systems  Constitutional: Negative for fatigue.  Eyes: Negative for visual disturbance.  Respiratory: Negative for cough, chest tightness and shortness of breath.   Cardiovascular: Negative for chest pain, palpitations and leg swelling.  Neurological: Negative for dizziness, syncope, weakness, light-headedness and headaches.       Objective:   Physical Exam  Constitutional: He is oriented to person, place, and time. He appears well-developed and well-nourished.  HENT:  Right Ear: External ear normal.  Left Ear: External ear normal.  Mouth/Throat: Oropharynx is clear and moist.  Eyes: Pupils are equal, round, and reactive to light.  Neck: Neck supple. No thyromegaly present.  Cardiovascular:  Normal rate and regular rhythm.  Pulmonary/Chest: Effort normal and breath sounds normal. No respiratory distress. He has no wheezes. He has no rales.  Musculoskeletal: He exhibits no edema.  Neurological: He is alert and oriented to person, place, and time.       Assessment:     Elevated blood pressure. Much improved at this time    Plan:     -we've challenged him to try to lose another 5-10 pounds -Engage in regular aerobic exercise as much as possible -Monitor blood pressure and be in touch if consistently greater than 130/85  Eulas Post MD [facility]

## 2017-04-05 NOTE — Telephone Encounter (Signed)
Next Visit: 12/27/16 Last Visit:04/27/17 Labs: 04/04/17 Stable TB Gold: 12/27/16 Neg   Okay to refill Enbrel per Dr. Estanislado Pandy

## 2017-04-05 NOTE — Patient Instructions (Signed)
Continue with weight loss efforts. Monitor blood pressure and be in touch if consistently > 130/85.

## 2017-04-06 ENCOUNTER — Other Ambulatory Visit: Payer: Self-pay | Admitting: Pharmacist

## 2017-04-06 MED ORDER — ETANERCEPT 50 MG/ML ~~LOC~~ SOSY: 50.0000 mg | PREFILLED_SYRINGE | SUBCUTANEOUS | 2 refills | Status: DC

## 2017-04-06 MED FILL — ENBREL 50 MG/ML SYRINGE: 50 | 28 days supply | Qty: 4 | Fill #0

## 2017-04-17 NOTE — Progress Notes (Signed)
Office Visit Note  Patient: [name]             Date of Birth: 1965-09-22           MRN: [id number]             PCP: Eulas Post, MD Referring: Eulas Post, MD Visit Date: 04/27/2017 Occupation: @GUAROCC @    Subjective:  Joint stiffness.   History of Present Illness: [name] is a 51 y.o. male with history of psoriatic arthritis, psoriasis and osteoarthritis. He states he continues to have some joint stiffness. He denies any joint swelling. His been tolerating Enbrel weekly without any side effects. He has not had any rash from psoriasis. His bilateral hip replacements are doing well. His neck and lower back pain is tolerable. He has had no recurrence of plantar fasciitis recently.  Activities of Daily Living:  Patient reports morning stiffness for 2 minutes.   Patient Reports nocturnal pain. Bilateral hips Difficulty dressing/grooming: Denies Difficulty climbing stairs: Denies Difficulty getting out of chair: Denies Difficulty using hands for taps, buttons, cutlery, and/or writing: Denies   Review of Systems  Constitutional: Negative.  Negative for fatigue, night sweats and weakness ( ).  HENT: Negative.  Negative for mouth sores, mouth dryness and nose dryness.   Eyes: Negative.  Negative for redness and dryness.  Respiratory: Negative.  Negative for shortness of breath and difficulty breathing.   Cardiovascular: Negative.  Negative for chest pain, palpitations, hypertension, irregular heartbeat and swelling in legs/feet.  Gastrointestinal: Negative.  Negative for constipation and diarrhea.  Endocrine: Negative.  Negative for increased urination.  Genitourinary: Negative.   Musculoskeletal: Positive for arthralgias, joint pain and morning stiffness. Negative for joint swelling, myalgias, muscle weakness, muscle tenderness and myalgias.  Skin: Negative.  Negative for color change, rash, hair loss, nodules/bumps, skin tightness, ulcers and sensitivity  to sunlight.  Allergic/Immunologic: Negative for susceptible to infections.  Neurological: Negative.  Negative for dizziness, fainting, memory loss and night sweats.  Hematological: Negative.  Negative for swollen glands.  Psychiatric/Behavioral: Positive for depressed mood. Negative for sleep disturbance. The patient is not nervous/anxious.        He lost his mother recently.    PMFS History:  Patient Active Problem List   Diagnosis Date Noted  . Essential tremor 04/03/2017  . Elevated blood pressure reading 02/03/2017  . Failed total hip arthroplasty ([place]) 11/23/2016  . High risk medications (not anticoagulants) long-term use 09/20/2016  . Bilateral hand pain 09/20/2016  . DDD (degenerative disc disease), cervical 09/20/2016  . History of total hip replacement, bilateral 09/20/2016  . DDD (degenerative disc disease), lumbar 09/20/2016  . Bilateral plantar fasciitis 09/20/2016  . Psoriasis 09/20/2016  . Primary osteoarthritis of both feet 09/20/2016  . Primary osteoarthritis of both hands 09/20/2016  . Primary osteoarthritis of both knees 09/20/2016  . Prediabetes 09/22/2014  . Hyperlipidemia 09/22/2014  . URI (upper respiratory infection) 07/07/2012  . Acute bronchitis 07/07/2012  . HYPERLIPIDEMIA 07/03/2008  . OBESITY 07/03/2008  . AVASCULAR NECROSIS, FEMORAL HEAD 07/03/2008  . Psoriatic arthritis ([place]) 05/02/2001    Past Medical History:  Diagnosis Date  . History of avascular necrosis of capital femoral epiphysis 2006, 2007   Bilateral Femoral Head  . HLD (hyperlipidemia)   . Psoriatic arthritis ([place])     Family History  Problem Relation Age of Onset  . Pneumonia Father   . Tremor Neg Hx    Past Surgical History:  Procedure Laterality Date  .  JOINT REPLACEMENT Bilateral 2006, 2007   Necrosis femoral head (bilateral)  . TOTAL HIP REVISION Right 11/23/2016   Procedure: Acetabulum liner and femoral head revision;  Surgeon: [name], MD;  Location: [facility];   Service: Orthopedics;  Laterality: Right;   Social History   Social History Narrative   Lives at home w/ his wife   Right-handed   Caffeine: 2-3 cups per day     Objective: Vital Signs: BP 133/76 (BP Location: Left Arm, Patient Position: Sitting, Cuff Size: Normal)   Pulse 66   Ht 5\' 9"  (1.753 m)   Wt 242 lb (109.8 kg)   BMI 35.74 kg/m    Physical Exam  Constitutional: He is oriented to person, place, and time. He appears well-developed and well-nourished.  HENT:  Head: Normocephalic and atraumatic.  Eyes: Conjunctivae and EOM are normal. Pupils are equal, round, and reactive to light.  Neck: Normal range of motion. Neck supple.  Cardiovascular: Normal rate, regular rhythm and normal heart sounds.  Pulmonary/Chest: Effort normal and breath sounds normal.  Abdominal: Soft. Bowel sounds are normal.  Neurological: He is alert and oriented to person, place, and time.  Skin: Skin is warm and dry. Capillary refill takes less than 2 seconds.  Psychiatric: He has a normal mood and affect. His behavior is normal.  Nursing note and vitals reviewed.    Musculoskeletal Exam: C-spine and very limited range of motion. Thoracic and lumbar spine limited range of motion. Shoulder joints full range of motion without discomfort. He has bilateral elbow joint contractures which are unchanged. His weight limited range of motion of his bilateral wrist joint. He has incomplete fist formation bilaterally with PIP/DIP limitation. No synovitis was noted. He has synovial thickening. Bilateral hip replacements are doing fairly well. He had no synovitis or swelling over his knee joints. Ankle joints were good range of motion. He had no tenderness over MCPs or PIPs.  CDAI Exam: CDAI Homunculus Exam:   Joint Counts:  CDAI Tender Joint count: 0 CDAI Swollen Joint count: 0  Global Assessments:  Patient Global Assessment: 2 Provider Global Assessment: 2  CDAI Calculated Score: 4    Investigation: No  additional findings.TB Gold: 12/27/2016 Negative  CBC Latest Ref Rng & Units 04/03/2017 12/27/2016 11/24/2016  WBC 3.8 - 10.8 Thousand/uL 4.5 4.6 8.3  Hemoglobin 13.2 - 17.1 g/dL 15.8 14.8 12.5(L)  Hematocrit 38.5 - 50.0 % 46.7 45.2 36.5(L)  Platelets 140 - 400 Thousand/uL 139(L) 141 135(L)   CMP Latest Ref Rng & Units 04/03/2017 12/27/2016 11/24/2016  Glucose 65 - 99 mg/dL 98 85 113(H)  BUN 7 - 25 mg/dL 23 21 17   Creatinine 0.70 - 1.33 mg/dL 1.08 1.06 0.91  Sodium 135 - 146 mmol/L 139 137 139  Potassium 3.5 - 5.3 mmol/L 5.2 4.4 4.2  Chloride 98 - 110 mmol/L 105 103 107  CO2 20 - 32 mmol/L 28 24 24   Calcium 8.6 - 10.3 mg/dL 9.9 9.3 8.5(L)  Total Protein 6.1 - 8.1 g/dL 6.9 7.2 -  Total Bilirubin 0.2 - 1.2 mg/dL 0.6 0.7 -  Alkaline Phos 40 - 115 U/L - 98 -  AST 10 - 35 U/L 22 17 -  ALT 9 - 46 U/L 32 21 -    Imaging: No results found.  Speciality Comments: No specialty comments available.    Procedures:  No procedures performed Allergies: Gold-containing drug products   Assessment / Plan:     Visit Diagnoses: Psoriatic arthritis ([place]): He has no active synovitis  on examination. Although he does have multiple contractures and limitation of range of motion of multiple joints. I offered repeat x-rays today. He would like to do them at following visit. His arthritis seems to be very well controlled on an Brohl which she is tolerating well.  Psoriasis: He has no active lesions of psoriasis.  High risk medications (not anticoagulants) long-term use - Enbrel 50 mg sq q wk. His labs have been stable we'll continue to monitor his labs every 3 months.  Primary osteoarthritis of both hands: He has some osteoarthritis in his hands.  History of total hip replacement, bilateral - 11/23/2016 Revision of right hip by Dr. Maureen Ralphs. Doing well.  Primary osteoarthritis of both knees: He has some stiffness in his knee joints after prolonged sitting.  Primary osteoarthritis of both feet: Doing better  he has not had any recent problems with plantar fasciitis.   DDD (degenerative disc disease), cervical: He has limited range of motion and stiffness.  DDD (degenerative disc disease), lumbar: Limited range of motion  History of hypertension: His blood pressure is well controlled.  History of vitamin D deficiency: He's been taking vitamin D on a regular basis.  History of prediabetes  History of hyperlipidemia    Orders: No orders of the defined types were placed in this encounter.  No orders of the defined types were placed in this encounter.   Face-to-face time spent with patient was 30 minutes. 50% of time was spent in counseling and coordination of care.  Follow-Up Instructions: Return in about 5 months (around 09/25/2017) for Psoriatic arthritis, Osteoarthritis.   Bo Merino, MD  Note - This record has been created using Editor, commissioning.  Chart creation errors have been sought, but may not always  have been located. Such creation errors do not reflect on  the standard of medical care.

## 2017-04-27 ENCOUNTER — Ambulatory Visit (INDEPENDENT_AMBULATORY_CARE_PROVIDER_SITE_OTHER): Payer: 59 | Admitting: Rheumatology

## 2017-04-27 ENCOUNTER — Encounter: Payer: Self-pay | Admitting: Rheumatology

## 2017-04-27 VITALS — BP 133/76 | HR 66 | Ht 69.0 in | Wt 242.0 lb

## 2017-04-27 DIAGNOSIS — M5136 Other intervertebral disc degeneration, lumbar region: Secondary | ICD-10-CM | POA: Diagnosis not present

## 2017-04-27 DIAGNOSIS — Z8679 Personal history of other diseases of the circulatory system: Secondary | ICD-10-CM

## 2017-04-27 DIAGNOSIS — L409 Psoriasis, unspecified: Secondary | ICD-10-CM

## 2017-04-27 DIAGNOSIS — L4050 Arthropathic psoriasis, unspecified: Secondary | ICD-10-CM

## 2017-04-27 DIAGNOSIS — M5030 Other cervical disc degeneration, unspecified cervical region: Secondary | ICD-10-CM

## 2017-04-27 DIAGNOSIS — M19071 Primary osteoarthritis, right ankle and foot: Secondary | ICD-10-CM

## 2017-04-27 DIAGNOSIS — Z96643 Presence of artificial hip joint, bilateral: Secondary | ICD-10-CM

## 2017-04-27 DIAGNOSIS — Z79899 Other long term (current) drug therapy: Secondary | ICD-10-CM | POA: Diagnosis not present

## 2017-04-27 DIAGNOSIS — M19041 Primary osteoarthritis, right hand: Secondary | ICD-10-CM

## 2017-04-27 DIAGNOSIS — Z8639 Personal history of other endocrine, nutritional and metabolic disease: Secondary | ICD-10-CM | POA: Diagnosis not present

## 2017-04-27 DIAGNOSIS — M170 Bilateral primary osteoarthritis of knee: Secondary | ICD-10-CM

## 2017-04-27 DIAGNOSIS — M19072 Primary osteoarthritis, left ankle and foot: Secondary | ICD-10-CM

## 2017-04-27 DIAGNOSIS — M19042 Primary osteoarthritis, left hand: Secondary | ICD-10-CM

## 2017-04-27 DIAGNOSIS — Z87898 Personal history of other specified conditions: Secondary | ICD-10-CM

## 2017-04-27 NOTE — Patient Instructions (Signed)
Standing Labs We placed an order today for your standing lab work.    Please come back and get your standing labs in March and every 3 months  We have open lab Monday through Friday from 8:30-11:30 AM and 1:30-4 PM at the office of Dr. Renessa Wellnitz.   The office is located at [address], [address], [place], [place] [address] No appointment is necessary.   Labs are drawn by [facility].  You may receive a bill from [facility] for your lab work. If you have any questions regarding directions or hours of operation,  please call [phone].    

## 2017-05-08 ENCOUNTER — Telehealth: Payer: Self-pay

## 2017-05-08 NOTE — Telephone Encounter (Signed)
Received a prior authorization request for Enbrel. Authorization was submitted to pts insurance via cover my meds. Will update once we receive a response.   [name], [place], CPhT 10:52 AM

## 2017-05-11 ENCOUNTER — Telehealth: Payer: Self-pay | Admitting: Rheumatology

## 2017-05-11 NOTE — Telephone Encounter (Signed)
Patient advised he may come by the office to get a sample of Enbrel.

## 2017-05-11 NOTE — Telephone Encounter (Signed)
Patient called stating that he is out of his Enbrel and needs a refill.  Patient is checking to see if there are samples he could have because he is going out of town tomorrow 05/12/17.  CB# [phone]

## 2017-05-12 ENCOUNTER — Telehealth: Payer: Self-pay | Admitting: Rheumatology

## 2017-05-12 NOTE — Telephone Encounter (Signed)
Calling back about prior approval for patients Enbrel. Ref # [id number]

## 2017-05-15 NOTE — Telephone Encounter (Signed)
Received a fax from [facility] regarding a PA for Enbrel for pt. It asks if pt has achieved or maintained clear or minimal disease or a decrease in PASI of at least 50% or more while on therapy?  Called and left message on the secure line [phone] with the answer of yes. Will send document to scan center and update once we receive a response.   Keah Lamba, CPhT 1:20 PM

## 2017-05-16 MED FILL — ENBREL 50 MG/ML SYRINGE: 50 | 28 days supply | Qty: 4 | Fill #1

## 2017-05-16 NOTE — Telephone Encounter (Signed)
Received a fax from [facility] regarding a prior authorization approval for ENBREL from 05/15/17 to 05/15/2018.   Reference number:[id number] Phone number: [phone]  Will send document to scan center.  Called pt to update. Patient is due for his next injection on 05/20/2017. His Rx was filled at [facility]). He voices understanding and denies any questions at this time.  Alphonse Asbridge, [place], CPhT 1:31 PM

## 2017-05-17 DIAGNOSIS — T560X1D Toxic effect of lead and its compounds, accidental (unintentional), subsequent encounter: Secondary | ICD-10-CM | POA: Diagnosis not present

## 2017-06-05 ENCOUNTER — Telehealth: Payer: Self-pay | Admitting: Family Medicine

## 2017-06-05 NOTE — Telephone Encounter (Signed)
We could call in prophylactic dose- Tamiflu 75 mg po qd for 10 days.

## 2017-06-05 NOTE — Telephone Encounter (Signed)
Spoke with pt and he denies any flu-like s/s. He states wife has been sick since late last week and tested pos today for Flu. He would like to know if you would recommend he start Tamiflu.  Dr. [name] - Please advise. Thanks!

## 2017-06-05 NOTE — Telephone Encounter (Signed)
Copied from [place] [id number]. Topic: Quick Communication - See Telephone Encounter >> Jun 05, 2017  3:26 PM Bea Graff, NT wrote: CRM for notification. See Telephone encounter for: Pts wife tested positive for the flu today. Pt wants to see if tamiflu can be called in for him to his pharmacy. [place].   06/05/17.

## 2017-06-06 ENCOUNTER — Ambulatory Visit: Payer: 59 | Admitting: Rheumatology

## 2017-06-06 MED ORDER — OSELTAMIVIR PHOSPHATE 75 MG PO CAPS: 75.0000 mg | ORAL_CAPSULE | Freq: Every day | ORAL | 0 refills | Status: AC

## 2017-06-06 MED FILL — OSELTAMIVIR PHOSPHATE 75 MG: 75 | 10 days supply | Qty: 10 | Fill #0

## 2017-06-06 NOTE — Telephone Encounter (Signed)
Spoke with pt and advised of rx. Nothing further needed at this time.

## 2017-06-11 DIAGNOSIS — S60222A Contusion of left hand, initial encounter: Secondary | ICD-10-CM | POA: Diagnosis not present

## 2017-06-20 MED FILL — ENBREL 50 MG/ML SYRINGE: 50 | 28 days supply | Qty: 4 | Fill #2

## 2017-07-11 ENCOUNTER — Other Ambulatory Visit: Payer: Self-pay | Admitting: Rheumatology

## 2017-07-11 ENCOUNTER — Ambulatory Visit: Payer: 59 | Admitting: Pharmacist

## 2017-07-11 DIAGNOSIS — Z79899 Other long term (current) drug therapy: Secondary | ICD-10-CM

## 2017-07-11 MED ORDER — ETANERCEPT 50 MG/ML ~~LOC~~ SOSY: PREFILLED_SYRINGE | SUBCUTANEOUS | 0 refills | Status: DC

## 2017-07-11 MED FILL — ENBREL 50 MG/ML SYRINGE: 50 | 28 days supply | Qty: 4 | Fill #0

## 2017-07-11 NOTE — Progress Notes (Signed)
   S: Patient presents today to the [facility].  Patient is currently taking Enbrel for psoriatic arthritis. Patient is managed by Dr. Estanislado Pandy for this.   Adherence: no recent missed doses  Efficacy: working well, waiting to get repeat X-rays for some continued joint issues.  Dosing: Enbrel 50 mg weekly  Drug-drug interactions: none  Screening: TB test: completed per patient (negative) Hepatitis: completed per patient  Monitoring: S/sx of infection: denies CBC: done q3 months, all have been normal, except PLT (see below) S/sx of hypersensitivity: denies S/sx of malignancy: denies S/sx of heart failure: denies    O:     Lab Results  Component Value Date   WBC 4.5 04/03/2017   HGB 15.8 04/03/2017   HCT 46.7 04/03/2017   MCV 89.8 04/03/2017   PLT 139 (L) 04/03/2017      Chemistry      Component Value Date/Time   NA 139 04/03/2017 0908   K 5.2 04/03/2017 0908   CL 105 04/03/2017 0908   CO2 28 04/03/2017 0908   BUN 23 04/03/2017 0908   CREATININE 1.08 04/03/2017 0908      Component Value Date/Time   CALCIUM 9.9 04/03/2017 0908   ALKPHOS 98 12/27/2016 0935   AST 22 04/03/2017 0908   ALT 32 04/03/2017 0908   BILITOT 0.6 04/03/2017 0908       A/P: 1. Medication review: patient on Enbrel for psoriatic arthritis and has restarted it since being off of it over the summer due to surgery. No questions or concerns about medication. Reviewed the medication with him, including the increase risk of infection, the need for close monitoring of lab work, the possible increased risk of malignancy, and the risk of injection site reactions. No recommendations for changes. See labs above.   Christella Hartigan, PharmD, BCPS, BCACP, [facility] [phone]

## 2017-07-11 NOTE — Telephone Encounter (Signed)
Last Visit: 04/27/17 Next Visit: 10/09/17 Labs: 04/03/17 Stable TB Gold: 12/27/16 Neg   Patient reminded he is due to update labs.  Okay to refill 30 day supply per Dr. Estanislado Pandy

## 2017-07-13 ENCOUNTER — Other Ambulatory Visit: Payer: Self-pay

## 2017-07-13 DIAGNOSIS — Z79899 Other long term (current) drug therapy: Secondary | ICD-10-CM

## 2017-07-13 DIAGNOSIS — Z8639 Personal history of other endocrine, nutritional and metabolic disease: Secondary | ICD-10-CM | POA: Diagnosis not present

## 2017-07-14 LAB — CBC WITH DIFFERENTIAL/PLATELET
BASOS PCT: 0.3 %
Basophils Absolute: 22 cells/uL (ref 0–200)
EOS PCT: 0.4 %
Eosinophils Absolute: 30 cells/uL (ref 15–500)
HEMATOCRIT: 46.9 % (ref 38.5–50.0)
HEMOGLOBIN: 16.3 g/dL (ref 13.2–17.1)
LYMPHS ABS: 1687 {cells}/uL (ref 850–3900)
MCH: 31.6 pg (ref 27.0–33.0)
MCHC: 34.8 g/dL (ref 32.0–36.0)
MCV: 90.9 fL (ref 80.0–100.0)
MPV: 10.9 fL (ref 7.5–12.5)
Monocytes Relative: 4.2 %
NEUTROS ABS: 5350 {cells}/uL (ref 1500–7800)
Neutrophils Relative %: 72.3 %
Platelets: 147 10*3/uL (ref 140–400)
RBC: 5.16 10*6/uL (ref 4.20–5.80)
RDW: 13 % (ref 11.0–15.0)
Total Lymphocyte: 22.8 %
WBC: 7.4 10*3/uL (ref 3.8–10.8)
WBCMIX: 311 {cells}/uL (ref 200–950)

## 2017-07-14 LAB — VITAMIN D 25 HYDROXY (VIT D DEFICIENCY, FRACTURES): VIT D 25 HYDROXY: 44 ng/mL (ref 30–100)

## 2017-07-14 LAB — COMPLETE METABOLIC PANEL WITH GFR
AG Ratio: 1.9 (calc) (ref 1.0–2.5)
ALKALINE PHOSPHATASE (APISO): 72 U/L (ref 40–115)
ALT: 31 U/L (ref 9–46)
AST: 21 U/L (ref 10–35)
Albumin: 4.5 g/dL (ref 3.6–5.1)
BUN: 20 mg/dL (ref 7–25)
CO2: 27 mmol/L (ref 20–32)
CREATININE: 0.99 mg/dL (ref 0.70–1.33)
Calcium: 9.6 mg/dL (ref 8.6–10.3)
Chloride: 104 mmol/L (ref 98–110)
GFR, Est African American: 100 mL/min/{1.73_m2} (ref >=60)
GFR, Est Non African American: 87 mL/min/{1.73_m2} (ref >=60)
GLOBULIN: 2.4 g/dL (ref 1.9–3.7)
GLUCOSE: 87 mg/dL (ref 65–99)
Potassium: 4.4 mmol/L (ref 3.5–5.3)
SODIUM: 138 mmol/L (ref 135–146)
Total Bilirubin: 0.6 mg/dL (ref 0.2–1.2)
Total Protein: 6.9 g/dL (ref 6.1–8.1)

## 2017-08-15 ENCOUNTER — Telehealth: Payer: Self-pay

## 2017-08-15 NOTE — Telephone Encounter (Signed)
Received a notification from [facility] stating that they have had unsucessful attempts at reaching pt to schedule shipment of meds. Called pt to update. Left voicemail.  Lily Velasquez, [place], CPhT 4:30 PM

## 2017-08-16 ENCOUNTER — Telehealth: Payer: Self-pay | Admitting: Rheumatology

## 2017-08-16 ENCOUNTER — Telehealth: Payer: Self-pay

## 2017-08-16 NOTE — Telephone Encounter (Signed)
Patient left a voicemail stating he was returning your call. °

## 2017-08-16 NOTE — Telephone Encounter (Signed)
-----   Message from Altamese Dilling sent at 08/15/2017  4:50 PM EDT ----- Contact: [phone] Patient returned your call.  CB#[phone].  Thank you.

## 2017-08-16 NOTE — Telephone Encounter (Signed)
Returned pts call to discuss refill on Enbrel at [facility]. Had to leave a message.   Catherina Pates, [place], CPhT 10:18 AM

## 2017-08-21 ENCOUNTER — Other Ambulatory Visit: Payer: Self-pay | Admitting: Pharmacist

## 2017-08-21 ENCOUNTER — Other Ambulatory Visit: Payer: Self-pay | Admitting: Rheumatology

## 2017-08-21 MED ORDER — ETANERCEPT 50 MG/ML ~~LOC~~ SOSY: PREFILLED_SYRINGE | SUBCUTANEOUS | 0 refills | Status: DC

## 2017-08-21 MED FILL — ENBREL 50 MG/ML SYRINGE: 50 | 28 days supply | Qty: 4 | Fill #0

## 2017-08-21 NOTE — Telephone Encounter (Signed)
Spoke to pt about his refill on Enbrel at [facility]. He states that he called in the request this morning. A refill was sent to the pharmacy for him. He plans to pick it up today.   Monzerat Handler, [place], CPhT 2:15 PM

## 2017-08-21 NOTE — Telephone Encounter (Signed)
Last Visit: 04/27/17 Next Visit: 10/09/17 Labs: 07/13/17 WNL TB Gold: 12/27/16 Neg   Okay to refill per Dr. Estanislado Pandy

## 2017-09-27 MED FILL — ENBREL 50 MG/ML SYRINGE: 50 | 28 days supply | Qty: 4 | Fill #1

## 2017-09-27 NOTE — Progress Notes (Signed)
Office Visit Note  Patient: [name]             Date of Birth: 04-Jan-1966           MRN: [id number]             PCP: Eulas Post, MD Referring: Eulas Post, MD Visit Date: 10/09/2017 Occupation: @GUAROCC @    Subjective:  Medication management   History of Present Illness: [name] is a 52 y.o. male with history of psoriatic arthritis psoriasis and osteoarthritis.  He states he has been quite active.  He has been walking and swimming.  He has been taking Enbrel every 8 days.  He has noticed occasional rash on his extremities.  He denies any joint swelling.  He continues to have a stiffness in his C-spine.  His lower back pain is not as severe recently.  His bilateral total hip replacement is doing well.  Activities of Daily Living:  Patient reports morning stiffness for 20  minute.   Patient Denies nocturnal pain.  Difficulty dressing/grooming: Denies Difficulty climbing stairs: Denies Difficulty getting out of chair: Reports Difficulty using hands for taps, buttons, cutlery, and/or writing: Denies   Review of Systems  Constitutional: Negative for fever and weight gain.  HENT: Negative for ear pain.   Eyes: Negative for pain.  Respiratory: Negative for cough and shortness of breath.   Cardiovascular: Negative for swelling in legs/feet.  Gastrointestinal: Negative for constipation and diarrhea.  Genitourinary: Negative for difficulty urinating.  Musculoskeletal: Positive for arthralgias, joint pain, myalgias, morning stiffness and myalgias. Negative for joint swelling and muscle weakness.  Skin: Negative for rash, hair loss and sensitivity to sunlight.  Neurological: Negative for numbness and weakness.  Hematological: Negative for bruising/bleeding tendency.  Psychiatric/Behavioral: Negative for depressed mood and sleep disturbance.    PMFS History:  Patient Active Problem List   Diagnosis Date Noted  . Essential tremor 04/03/2017  . Elevated  blood pressure reading 02/03/2017  . Failed total hip arthroplasty ([place] [place]) 11/23/2016  . High risk medications (not anticoagulants) long-term use 09/20/2016  . Bilateral hand pain 09/20/2016  . DDD (degenerative disc disease), cervical 09/20/2016  . History of total hip replacement, bilateral 09/20/2016  . DDD (degenerative disc disease), lumbar 09/20/2016  . Bilateral plantar fasciitis 09/20/2016  . Psoriasis 09/20/2016  . Primary osteoarthritis of both feet 09/20/2016  . Primary osteoarthritis of both hands 09/20/2016  . Primary osteoarthritis of both knees 09/20/2016  . Prediabetes 09/22/2014  . Hyperlipidemia 09/22/2014  . URI (upper respiratory infection) 07/07/2012  . Acute bronchitis 07/07/2012  . HYPERLIPIDEMIA 07/03/2008  . OBESITY 07/03/2008  . AVASCULAR NECROSIS, FEMORAL HEAD 07/03/2008  . Psoriatic arthritis ([place]) 05/02/2001    Past Medical History:  Diagnosis Date  . History of avascular necrosis of capital femoral epiphysis 2006, 2007   Bilateral Femoral Head  . HLD (hyperlipidemia)   . Psoriatic arthritis ([place])     Family History  Problem Relation Age of Onset  . Pneumonia Father   . Tremor Neg Hx    Past Surgical History:  Procedure Laterality Date  . JOINT REPLACEMENT Bilateral 2006, 2007   Necrosis femoral head (bilateral)  . TOTAL HIP REVISION Right 11/23/2016   Procedure: Acetabulum liner and femoral head revision;  Surgeon: [name], MD;  Location: [facility];  Service: Orthopedics;  Laterality: Right;   Social History   Social History Narrative   Lives at home w/ his wife   Right-handed   Caffeine: 2-3  cups per day     Objective: Vital Signs: BP 137/77 (BP Location: Left Arm, Patient Position: Sitting, Cuff Size: Normal)   Pulse (!) 57   Ht 5\' 9"  (1.753 m)   Wt 240 lb (108.9 kg)   BMI 35.44 kg/m    Physical Exam  Constitutional: He is oriented to person, place, and time. He appears well-developed and well-nourished.  HENT:  Head:  Normocephalic and atraumatic.  Eyes: Pupils are equal, round, and reactive to light. Conjunctivae and EOM are normal.  Neck: Normal range of motion. Neck supple.  Cardiovascular: Normal rate, regular rhythm and normal heart sounds.  Pulmonary/Chest: Effort normal and breath sounds normal.  Abdominal: Soft. Bowel sounds are normal.  Neurological: He is alert and oriented to person, place, and time.  Skin: Skin is warm and dry. Capillary refill takes less than 2 seconds.  Psychiatric: He has a normal mood and affect. His behavior is normal.  Nursing note and vitals reviewed.    Musculoskeletal Exam: C-spine thoracic lumbar spine are limited range of motion.  Shoulder joints elbow joints are good range of motion.  He has limited range of motion of his wrist joints MCPs PIPs and DIPs with incomplete fist formation.  No synovitis was noted.  Hip joints are replacement in good range of motion.  Knee joints ankles MTPs were in good range of motion with no synovitis.  CDAI Exam: CDAI Homunculus Exam:   Joint Counts:  CDAI Tender Joint count: 0 CDAI Swollen Joint count: 0  Global Assessments:  Patient Global Assessment: 2 Provider Global Assessment: 2  CDAI Calculated Score: 4    Investigation: October 04, 2017 CBC platelets 131, CMP normal No additional findings.TB Gold: 12/27/2016 Negative   Imaging: No results found.  Speciality Comments: No specialty comments available.    Procedures:  No procedures performed Allergies: Gold-containing drug products   Assessment / Plan:     Visit Diagnoses: Psoriatic arthritis (HCC)-patient has no active synovitis on examination.  He appears to be doing well on Enbrel which she has a spaced to every 8 days.  Psoriasis-he has mild erythema over his elbows but no active psoriasis patches were noted.  High risk medication use - enbrel q 8 days -his labs have been stable we will continue to monitor labs every 3 months.  Plan: QuantiFERON-TB Gold  Plus  Primary osteoarthritis of both hands-joint protection muscle strengthening was discussed.  History of total hip replacement, bilateral - 11/23/2016 Revision of right hip by Dr. Maureen Ralphs.  Doing well.  Primary osteoarthritis of both knees-he has been exercising on regular basis.  Primary osteoarthritis of both feet  DDD (degenerative disc disease), cervical-he has limited range of motion of his C-spine.  DDD (degenerative disc disease), lumbar-he has off-and-on discomfort.  Vitamin D deficiency-he is taking supplement.  History of hyperlipidemia-weight loss diet and exercise was discussed at length.  Association of heart disease with psoriatic arthritis was also emphasized.  History of prediabetes  History of hypertension    Orders: Orders Placed This Encounter  Procedures  . QuantiFERON-TB Gold Plus   No orders of the defined types were placed in this encounter.   Face-to-face time spent with patient was 30 minutes. >50% of time was spent in counseling and coordination of care.  Follow-Up Instructions: Return in about 5 months (around 03/11/2018) for Psoriatic arthritis, Osteoarthritis.   Bo Merino, MD  Note - This record has been created using Editor, commissioning.  Chart creation errors have been sought, but may  not always  have been located. Such creation errors do not reflect on  the standard of medical care.

## 2017-10-04 ENCOUNTER — Other Ambulatory Visit: Payer: Self-pay

## 2017-10-04 DIAGNOSIS — Z79899 Other long term (current) drug therapy: Secondary | ICD-10-CM | POA: Diagnosis not present

## 2017-10-05 LAB — CBC WITH DIFFERENTIAL/PLATELET
BASOS ABS: 20 {cells}/uL (ref 0–200)
Basophils Relative: 0.4 %
EOS ABS: 20 {cells}/uL (ref 15–500)
Eosinophils Relative: 0.4 %
HEMATOCRIT: 45.1 % (ref 38.5–50.0)
Hemoglobin: 15.7 g/dL (ref 13.2–17.1)
LYMPHS ABS: 1301 {cells}/uL (ref 850–3900)
MCH: 31.6 pg (ref 27.0–33.0)
MCHC: 34.8 g/dL (ref 32.0–36.0)
MCV: 90.7 fL (ref 80.0–100.0)
MPV: 11.3 fL (ref 7.5–12.5)
Monocytes Relative: 4.7 %
NEUTROS PCT: 69 %
Neutro Abs: 3519 cells/uL (ref 1500–7800)
Platelets: 131 10*3/uL — ABNORMAL LOW (ref 140–400)
RBC: 4.97 10*6/uL (ref 4.20–5.80)
RDW: 13.1 % (ref 11.0–15.0)
Total Lymphocyte: 25.5 %
WBC: 5.1 10*3/uL (ref 3.8–10.8)
WBCMIX: 240 {cells}/uL (ref 200–950)

## 2017-10-05 LAB — COMPLETE METABOLIC PANEL WITH GFR
AG Ratio: 1.7 (calc) (ref 1.0–2.5)
ALBUMIN MSPROF: 4.5 g/dL (ref 3.6–5.1)
ALT: 26 U/L (ref 9–46)
AST: 25 U/L (ref 10–35)
Alkaline phosphatase (APISO): 66 U/L (ref 40–115)
BILIRUBIN TOTAL: 0.8 mg/dL (ref 0.2–1.2)
BUN: 20 mg/dL (ref 7–25)
CHLORIDE: 105 mmol/L (ref 98–110)
CO2: 24 mmol/L (ref 20–32)
Calcium: 9.6 mg/dL (ref 8.6–10.3)
Creat: 1.13 mg/dL (ref 0.70–1.33)
GFR, EST AFRICAN AMERICAN: 85 mL/min/{1.73_m2} (ref >=60)
GFR, Est Non African American: 73 mL/min/{1.73_m2} (ref >=60)
Globulin: 2.7 g/dL (calc) (ref 1.9–3.7)
Glucose, Bld: 84 mg/dL (ref 65–99)
POTASSIUM: 4.3 mmol/L (ref 3.5–5.3)
Sodium: 139 mmol/L (ref 135–146)
TOTAL PROTEIN: 7.2 g/dL (ref 6.1–8.1)

## 2017-10-05 NOTE — Progress Notes (Signed)
Platelets are low but is stable.

## 2017-10-09 ENCOUNTER — Encounter: Payer: Self-pay | Admitting: Rheumatology

## 2017-10-09 ENCOUNTER — Ambulatory Visit (INDEPENDENT_AMBULATORY_CARE_PROVIDER_SITE_OTHER): Payer: 59 | Admitting: Rheumatology

## 2017-10-09 VITALS — BP 137/77 | HR 57 | Ht 69.0 in | Wt 240.0 lb

## 2017-10-09 DIAGNOSIS — M19041 Primary osteoarthritis, right hand: Secondary | ICD-10-CM | POA: Diagnosis not present

## 2017-10-09 DIAGNOSIS — M19042 Primary osteoarthritis, left hand: Secondary | ICD-10-CM

## 2017-10-09 DIAGNOSIS — L4050 Arthropathic psoriasis, unspecified: Secondary | ICD-10-CM | POA: Diagnosis not present

## 2017-10-09 DIAGNOSIS — Z79899 Other long term (current) drug therapy: Secondary | ICD-10-CM

## 2017-10-09 DIAGNOSIS — M5136 Other intervertebral disc degeneration, lumbar region: Secondary | ICD-10-CM

## 2017-10-09 DIAGNOSIS — Z87898 Personal history of other specified conditions: Secondary | ICD-10-CM

## 2017-10-09 DIAGNOSIS — Z96643 Presence of artificial hip joint, bilateral: Secondary | ICD-10-CM

## 2017-10-09 DIAGNOSIS — E559 Vitamin D deficiency, unspecified: Secondary | ICD-10-CM

## 2017-10-09 DIAGNOSIS — M19071 Primary osteoarthritis, right ankle and foot: Secondary | ICD-10-CM | POA: Diagnosis not present

## 2017-10-09 DIAGNOSIS — L409 Psoriasis, unspecified: Secondary | ICD-10-CM | POA: Diagnosis not present

## 2017-10-09 DIAGNOSIS — M5030 Other cervical disc degeneration, unspecified cervical region: Secondary | ICD-10-CM

## 2017-10-09 DIAGNOSIS — Z8679 Personal history of other diseases of the circulatory system: Secondary | ICD-10-CM

## 2017-10-09 DIAGNOSIS — Z8639 Personal history of other endocrine, nutritional and metabolic disease: Secondary | ICD-10-CM | POA: Diagnosis not present

## 2017-10-09 DIAGNOSIS — M170 Bilateral primary osteoarthritis of knee: Secondary | ICD-10-CM

## 2017-10-09 DIAGNOSIS — M51369 Other intervertebral disc degeneration, lumbar region without mention of lumbar back pain or lower extremity pain: Secondary | ICD-10-CM

## 2017-10-09 DIAGNOSIS — M19072 Primary osteoarthritis, left ankle and foot: Secondary | ICD-10-CM

## 2017-10-09 NOTE — Patient Instructions (Signed)
Standing Labs We placed an order today for your standing lab work.    Please come back and get your standing labs in September and every 3 months TB Gold should be done in September  We have open lab Monday through Friday from 8:30-11:30 AM and 1:30-4:00 PM  at the office of Dr. Bo Merino.   You may experience shorter wait times on Monday and Friday afternoons. The office is located at [address] [place], [place], [place] [address] No appointment is necessary.   Labs are drawn by [facility].  You may receive a bill from [facility] for your lab work. If you have any questions regarding directions or hours of operation,  please call [phone].

## 2017-10-25 MED FILL — ENBREL 50 MG/ML SYRINGE: 50 | 28 days supply | Qty: 4 | Fill #2

## 2017-10-25 MED FILL — AMOXICILLIN 500 MG CAPSULE: 500 | 3 days supply | Qty: 12 | Fill #0

## 2017-11-17 ENCOUNTER — Other Ambulatory Visit: Payer: Self-pay | Admitting: Rheumatology

## 2017-11-17 ENCOUNTER — Other Ambulatory Visit: Payer: Self-pay | Admitting: Pharmacist

## 2017-11-17 MED ORDER — ETANERCEPT 50 MG/ML ~~LOC~~ SOSY: PREFILLED_SYRINGE | SUBCUTANEOUS | 0 refills | Status: DC

## 2017-11-17 NOTE — Telephone Encounter (Signed)
Last visit: 10/09/2017 Next visit: 03/12/2018 Labs: 10/04/2017 stable Tb gold: 12/27/2016 Negative   Okay to refill per Dr. Estanislado Pandy.

## 2017-11-22 ENCOUNTER — Ambulatory Visit: Payer: Self-pay

## 2017-11-22 ENCOUNTER — Encounter: Payer: Self-pay | Admitting: Family Medicine

## 2017-11-22 ENCOUNTER — Ambulatory Visit (INDEPENDENT_AMBULATORY_CARE_PROVIDER_SITE_OTHER): Payer: 59 | Admitting: Family Medicine

## 2017-11-22 VITALS — BP 128/78 | HR 96 | Temp 97.8°F | Wt 235.0 lb

## 2017-11-22 DIAGNOSIS — M79661 Pain in right lower leg: Secondary | ICD-10-CM | POA: Diagnosis not present

## 2017-11-22 NOTE — Telephone Encounter (Signed)
Pt. Reports right calf started hurting Monday - pain comes and goes. Has recently increased his exercise routine. No swelling or redness noted. Pain seems to be progressing. Walked yesterday and did not hurt. This morning having pain - Heating pad is helping. No shortness of breath or chest pain. Appointment made for today. Reason for Disposition . [1] MODERATE pain (e.g., interferes with normal activities, limping) AND [2] present > 3 days  Answer Assessment - Initial Assessment Questions 1. ONSET: "When did the pain start?"      Started Monday 2. LOCATION: "Where is the pain located?"      Right calf 3. PAIN: "How bad is the pain?"    (Scale 1-10; or mild, moderate, severe)   -  MILD (1-3): doesn't interfere with normal activities    -  MODERATE (4-7): interferes with normal activities (e.g., work or school) or awakens from sleep, limping    -  SEVERE (8-10): excruciating pain, unable to do any normal activities, unable to walk     3-4 4. WORK OR EXERCISE: "Has there been any recent work or exercise that involved this part of the body?"      No 5. CAUSE: "What do you think is causing the leg pain?"     Unsure 6. OTHER SYMPTOMS: "Do you have any other symptoms?" (e.g., chest pain, back pain, breathing difficulty, swelling, rash, fever, numbness, weakness)     No other symptoms- did not hurt while walking 7. PREGNANCY: "Is there any chance you are pregnant?" "When was your last menstrual period?"     n/a  Protocols used: LEG PAIN-A-AH

## 2017-11-22 NOTE — Progress Notes (Signed)
Subjective:    Patient ID: [name], male    DOB: [birth year], 52 y.o.   MRN: [id number]  No chief complaint on file.   HPI Patient was seen today for acute concern.  Pt endorses right calf pain since Monday which has become worse.  Pt notes the pain first thing in the am and goes away as the day progresses.  This am intense pain 10/10, making it difficult to walk.  Pt endorses increasing his cardio at the gym.  Tried tylenol and advil with no relief.  Pt denies shortness of breath, chest pain, calf swelling, or recent long distance travel.  Pt is on Enbrel for psoriatic arthritis.  Past Medical History:  Diagnosis Date  . History of avascular necrosis of capital femoral epiphysis 2006, 2007   Bilateral Femoral Head  . HLD (hyperlipidemia)   . Psoriatic arthritis (HCC)     Allergies  Allergen Reactions  . Gold-Containing Drug Products Rash    Denies oral or airway involvement - occurred in the 1980s.     ROS General: Denies fever, chills, night sweats, changes in weight, changes in appetite HEENT: Denies headaches, ear pain, changes in vision, rhinorrhea, sore throat CV: Denies CP, palpitations, SOB, orthopnea Pulm: Denies SOB, cough, wheezing GI: Denies abdominal pain, nausea, vomiting, diarrhea, constipation GU: Denies dysuria, hematuria, frequency, vaginal discharge Msk: Denies muscle cramps, joint pains  +R calf pain Neuro: Denies weakness, numbness, tingling Skin: Denies rashes, bruising Psych: Denies depression, anxiety, hallucinations     Objective:    Blood pressure 128/78, pulse 96, temperature 97.8 F (36.6 C), temperature source Oral, weight 235 lb (106.6 kg), SpO2 98 %.   Gen. Pleasant, well-nourished, in no distress, normal affect  HEENT: [place]/AT, face symmetric, no scleral icterus, PERRLA, nares patent without drainage Lungs: no accessory muscle use, CTAB, no wheezes or rales Cardiovascular: RRR, no m/r/g, no peripheral edema Musculoskeletal: No TTP  bilateral calves.  R calf 43 cm, L calf 41.5 cm.  No erythema, edema, or induration in bilateral calves..  Negative Homans sign.  No deformities, no cyanosis or clubbing, normal tone Neuro:  A&Ox3, CN II-XII intact, normal gait   Wt Readings from Last 3 Encounters:  11/22/17 235 lb (106.6 kg)  10/09/17 240 lb (108.9 kg)  04/27/17 242 lb (109.8 kg)    Lab Results  Component Value Date   WBC 5.1 10/04/2017   HGB 15.7 10/04/2017   HCT 45.1 10/04/2017   PLT 131 (L) 10/04/2017   GLUCOSE 84 10/04/2017   CHOL 210 (H) 09/28/2016   TRIG 104.0 09/28/2016   HDL 46.80 09/28/2016   LDLDIRECT 133.7 06/24/2008   LDLCALC 142 (H) 09/28/2016   ALT 26 10/04/2017   AST 25 10/04/2017   NA 139 10/04/2017   K 4.3 10/04/2017   CL 105 10/04/2017   CREATININE 1.13 10/04/2017   BUN 20 10/04/2017   CO2 24 10/04/2017   TSH 0.97 09/28/2016   PSA 1.01 09/28/2016   INR 0.99 11/16/2016   HGBA1C 5.1 11/16/2016    Assessment/Plan:  Right calf pain  -r/o DVT vs muscle strain given recent increase in exercise. -ok to use ice or massage. - Plan: VAS [place] LOWER EXTREMITY VENOUS (DVT), CANCELED: US Venous Img Lower Unilateral Right -pt given RTC or ED precautions if pain becomes worse prior to u/s tomorrow at 2pm at [facility] -f/u prn  Grier Mitts, MD

## 2017-11-22 NOTE — Telephone Encounter (Signed)
Confirmed pt is on the schedule to see Dr. Volanda Napoleon today 11/22/2017.

## 2017-11-23 ENCOUNTER — Ambulatory Visit (HOSPITAL_COMMUNITY)
Admission: RE | Admit: 2017-11-23 | Discharge: 2017-11-23 | Disposition: A | Discharge status: Home / Self Care | Payer: 59 | Source: Ambulatory Visit | Attending: Cardiology | Admitting: Cardiology

## 2017-11-23 DIAGNOSIS — M79661 Pain in right lower leg: Secondary | ICD-10-CM | POA: Insufficient documentation

## 2017-11-24 MED FILL — ENBREL 50 MG/ML SYRINGE: 50 | 28 days supply | Qty: 4 | Fill #0

## 2018-01-01 MED FILL — ENBREL 50 MG/ML SYRINGE: 50 | 28 days supply | Qty: 4 | Fill #1

## 2018-02-01 MED FILL — ENBREL 50 MG/ML SYRINGE: 50 | 28 days supply | Qty: 4 | Fill #2

## 2018-02-08 ENCOUNTER — Other Ambulatory Visit: Payer: Self-pay

## 2018-02-08 DIAGNOSIS — Z79899 Other long term (current) drug therapy: Secondary | ICD-10-CM

## 2018-02-08 DIAGNOSIS — E559 Vitamin D deficiency, unspecified: Secondary | ICD-10-CM | POA: Diagnosis not present

## 2018-02-10 LAB — CBC WITH DIFFERENTIAL/PLATELET
Basophils Absolute: 29 cells/uL (ref 0–200)
Basophils Relative: 0.5 %
EOS ABS: 40 {cells}/uL (ref 15–500)
Eosinophils Relative: 0.7 %
HEMATOCRIT: 46.5 % (ref 38.5–50.0)
HEMOGLOBIN: 16 g/dL (ref 13.2–17.1)
LYMPHS ABS: 1881 {cells}/uL (ref 850–3900)
MCH: 31.7 pg (ref 27.0–33.0)
MCHC: 34.4 g/dL (ref 32.0–36.0)
MCV: 92.1 fL (ref 80.0–100.0)
MPV: 11.9 fL (ref 7.5–12.5)
Monocytes Relative: 5.8 %
NEUTROS PCT: 60 %
Neutro Abs: 3420 cells/uL (ref 1500–7800)
Platelets: 139 10*3/uL — ABNORMAL LOW (ref 140–400)
RBC: 5.05 10*6/uL (ref 4.20–5.80)
RDW: 12.7 % (ref 11.0–15.0)
Total Lymphocyte: 33 %
WBC: 5.7 10*3/uL (ref 3.8–10.8)
WBCMIX: 331 {cells}/uL (ref 200–950)

## 2018-02-10 LAB — COMPLETE METABOLIC PANEL WITH GFR
AG Ratio: 1.6 (calc) (ref 1.0–2.5)
ALBUMIN MSPROF: 4.4 g/dL (ref 3.6–5.1)
ALT: 30 U/L (ref 9–46)
AST: 24 U/L (ref 10–35)
Alkaline phosphatase (APISO): 75 U/L (ref 40–115)
BUN: 20 mg/dL (ref 7–25)
CALCIUM: 9.6 mg/dL (ref 8.6–10.3)
CO2: 27 mmol/L (ref 20–32)
CREATININE: 0.98 mg/dL (ref 0.70–1.33)
Chloride: 104 mmol/L (ref 98–110)
GFR, EST AFRICAN AMERICAN: 101 mL/min/{1.73_m2} (ref >=60)
GFR, Est Non African American: 87 mL/min/{1.73_m2} (ref >=60)
GLUCOSE: 85 mg/dL (ref 65–99)
Globulin: 2.7 g/dL (calc) (ref 1.9–3.7)
Potassium: 4.3 mmol/L (ref 3.5–5.3)
Sodium: 138 mmol/L (ref 135–146)
TOTAL PROTEIN: 7.1 g/dL (ref 6.1–8.1)
Total Bilirubin: 0.7 mg/dL (ref 0.2–1.2)

## 2018-02-10 LAB — QUANTIFERON-TB GOLD PLUS
Mitogen-NIL: >10 IU/mL
NIL: 0.02 IU/mL
QuantiFERON-TB Gold Plus: NEGATIVE
TB1-NIL: 0 [IU]/mL
TB2-NIL: 0 [IU]/mL

## 2018-02-10 LAB — VITAMIN D 25 HYDROXY (VIT D DEFICIENCY, FRACTURES): Vit D, 25-Hydroxy: 49 ng/mL (ref 30–100)

## 2018-02-22 ENCOUNTER — Other Ambulatory Visit: Payer: Self-pay | Admitting: Rheumatology

## 2018-02-22 NOTE — Telephone Encounter (Signed)
Last visit: 10/09/2017 Next visit: 03/12/2018 Labs: 02/08/2018 Platelets are low but stable. All other labs are WNL. TB Gold: 02/08/2018 negative   Okay to refill per Dr. Estanislado Pandy.

## 2018-02-26 NOTE — Progress Notes (Signed)
Office Visit Note  Patient: [name]             Date of Birth: 01-20-1966           MRN: [id number]             PCP: Eulas Post, MD Referring: Eulas Post, MD Visit Date: 03/12/2018  Occupation: @GUAROCC @  Subjective:   Joint stiffness  History of Present Illness: [name] is a 52 y.o. male with history of psoriatic arthritis psoriasis and osteoarthritis.  States overall he states he is doing well.  Reports he still has pain in right knee and pain in bilateral hands on occasion but is stable.  Denies any flares or skin rash.  Reports fatigue and issues sleeping but takes melatonin on occasion which helps. He is currently taking Enbrel 50 mg weekly and denies any side effects.  Denies missing any doses. Denies any new medical conditions, allergies or medications.  He is to have some stiffness in his bilateral hands knee joints and feet due to underlying osteoarthritis.  Bilateral hip replacements are doing well.  He has disc disease of cervical and lumbar spine which causes a stiffness.  Activities of Daily Living:  Patient reports morning stiffness for 1-2  minutes.  Better than in the past. Patient Denies nocturnal pain.  Difficulty dressing/grooming: Denies Difficulty climbing stairs: Denies Difficulty getting out of chair: Denies Difficulty using hands for taps, buttons, cutlery, and/or writing: Denies  Review of Systems  Constitutional: Positive for fatigue. Negative for night sweats.  HENT: Negative for mouth sores, trouble swallowing, trouble swallowing, mouth dryness and nose dryness.   Eyes: Negative.  Negative for redness, visual disturbance and dryness.  Respiratory: Negative.  Negative for cough, hemoptysis, shortness of breath and difficulty breathing.   Cardiovascular: Negative.  Negative for chest pain, palpitations, hypertension, irregular heartbeat and swelling in legs/feet.  Gastrointestinal: Negative.  Negative for blood in stool,  constipation and diarrhea.  Endocrine: Negative.  Negative for increased urination.  Genitourinary: Negative.  Negative for painful urination.  Musculoskeletal: Positive for arthralgias and joint pain. Negative for joint swelling, myalgias, muscle weakness, morning stiffness, muscle tenderness and myalgias.  Skin: Negative for color change, rash, hair loss, nodules/bumps, skin tightness, ulcers and sensitivity to sunlight.  Allergic/Immunologic: Negative for susceptible to infections.  Neurological: Negative.  Negative for dizziness, fainting, memory loss, night sweats and weakness.  Hematological: Negative.  Negative for swollen glands.  Psychiatric/Behavioral: Positive for sleep disturbance. Negative for depressed mood. The patient is not nervous/anxious.     PMFS History:  Patient Active Problem List   Diagnosis Date Noted  . Essential tremor 04/03/2017  . Elevated blood pressure reading 02/03/2017  . Failed total hip arthroplasty ([place]) 11/23/2016  . High risk medications (not anticoagulants) long-term use 09/20/2016  . Bilateral hand pain 09/20/2016  . DDD (degenerative disc disease), cervical 09/20/2016  . History of total hip replacement, bilateral 09/20/2016  . DDD (degenerative disc disease), lumbar 09/20/2016  . Bilateral plantar fasciitis 09/20/2016  . Psoriasis 09/20/2016  . Primary osteoarthritis of both feet 09/20/2016  . Primary osteoarthritis of both hands 09/20/2016  . Primary osteoarthritis of both knees 09/20/2016  . Prediabetes 09/22/2014  . Hyperlipidemia 09/22/2014  . URI (upper respiratory infection) 07/07/2012  . Acute bronchitis 07/07/2012  . HYPERLIPIDEMIA 07/03/2008  . OBESITY 07/03/2008  . AVASCULAR NECROSIS, FEMORAL HEAD 07/03/2008  . Psoriatic arthritis ([place]) 05/02/2001    Past Medical History:  Diagnosis Date  . History  of avascular necrosis of capital femoral epiphysis 2006, 2007   Bilateral Femoral Head  . HLD (hyperlipidemia)   . Psoriatic  arthritis ([place])     Family History  Problem Relation Age of Onset  . Pneumonia Father   . Tremor Neg Hx    Past Surgical History:  Procedure Laterality Date  . JOINT REPLACEMENT Bilateral 2006, 2007   Necrosis femoral head (bilateral)  . TOTAL HIP REVISION Right 11/23/2016   Procedure: Acetabulum liner and femoral head revision;  Surgeon: [name], MD;  Location: [facility];  Service: Orthopedics;  Laterality: Right;   Social History   Social History Narrative   Lives at home w/ his wife   Right-handed   Caffeine: 2-3 cups per day    Objective: Vital Signs: BP 126/80 (BP Location: Left Arm, Patient Position: Sitting, Cuff Size: Normal)   Pulse 65   Resp 15   Ht 5\' 9"  (1.753 m)   Wt 228 lb (103.4 kg)   BMI 33.67 kg/m    Physical Exam  Constitutional: He is oriented to person, place, and time. He appears well-developed and well-nourished.  HENT:  Head: Normocephalic and atraumatic.  Eyes: Pupils are equal, round, and reactive to light. Conjunctivae and EOM are normal.  Neck: Normal range of motion. Neck supple.  Cardiovascular: Normal rate, regular rhythm and normal heart sounds.  Pulmonary/Chest: Effort normal and breath sounds normal.  Abdominal: Soft. Bowel sounds are normal.  Lymphadenopathy:    He has no cervical adenopathy.  Neurological: He is alert and oriented to person, place, and time.  Skin: Skin is warm and dry. Capillary refill takes less than 2 seconds.  Psychiatric: He has a normal mood and affect. His behavior is normal.  Nursing note and vitals reviewed.    Musculoskeletal Exam:   CDAI Exam: CDAI Score: 0.2  Patient Global Assessment: 1 (mm); Provider Global Assessment: 1 (mm) Swollen: 0 ; Tender: 0  Joint Exam   Not documented   There is currently no information documented on the homunculus. Go to the Rheumatology activity and complete the homunculus joint exam.  Investigation: No additional findings.  Imaging: No results  found.  Recent Labs: Lab Results  Component Value Date   WBC 5.7 02/08/2018   HGB 16.0 02/08/2018   PLT 139 (L) 02/08/2018   NA 138 02/08/2018   K 4.3 02/08/2018   CL 104 02/08/2018   CO2 27 02/08/2018   GLUCOSE 85 02/08/2018   BUN 20 02/08/2018   CREATININE 0.98 02/08/2018   BILITOT 0.7 02/08/2018   ALKPHOS 98 12/27/2016   AST 24 02/08/2018   ALT 30 02/08/2018   PROT 7.1 02/08/2018   ALBUMIN 4.4 12/27/2016   CALCIUM 9.6 02/08/2018   GFRAA 101 02/08/2018   QFTBGOLDPLUS NEGATIVE 02/08/2018    Speciality Comments: No specialty comments available.  Procedures:  No procedures performed Allergies: Gold-containing drug products   Assessment / Plan:     Visit Diagnoses: Psoriatic arthritis (HCC)-patient had no active synovitis on examination today he is doing better since he has been taking Enbrel injection once weekly.  Psoriasis-he has no active psoriasis on examination.  High risk medication use - enbrel subcu weekly.  His labs are stable.  He does have some thrombocytopenia which has been stable.  We will continue to monitor that.  His next labs will be in January and then every 3 months.  Primary osteoarthritis of both hands-he has some stiffness in his hands due to underlying osteoarthritis.  History of  total hip replacement, bilateral - 11/23/2016 Revision of right hip by Dr. Maureen Ralphs.  Doing well.  Primary osteoarthritis of both knees-he has some stiffness but no warmth swelling effusion was noted.  Primary osteoarthritis of both feet-he had no synovitis on examination.  DDD (degenerative disc disease), cervical-he has some stiffness.  DDD (degenerative disc disease), lumbar-he continues to have a stiffness but no chronic pain.  History of vitamin D deficiency-he is on vitamin D supplement.  Other medical problems are listed as follows:  History of prediabetes  History of hyperlipidemia  History of hypertension   Association of heart disease with psoriatic  arthritis was discussed. Need to monitor blood pressure, cholesterol, and to exercise 30-60 minutes on daily basis was discussed.    Recommend flu, Pneumovax 23, and Shingrix as indicated. Orders: No orders of the defined types were placed in this encounter.  No orders of the defined types were placed in this encounter.   Face-to-face time spent with patient was 30 minutes. Greater than 50% of time was spent in counseling and coordination of care.  Follow-Up Instructions: Return in about 5 months (around 08/11/2018) for Psoriatic arthritis, Osteoarthritis, DDD.   Bo Merino, MD  Note - This record has been created using Editor, commissioning.  Chart creation errors have been sought, but may not always  have been located. Such creation errors do not reflect on  the standard of medical care.

## 2018-03-08 MED FILL — ENBREL 50 MG/ML SOSY: 50 | 28 days supply | Qty: 4 | Fill #0

## 2018-03-12 ENCOUNTER — Encounter: Payer: Self-pay | Admitting: Rheumatology

## 2018-03-12 ENCOUNTER — Ambulatory Visit (INDEPENDENT_AMBULATORY_CARE_PROVIDER_SITE_OTHER): Payer: 59 | Admitting: Rheumatology

## 2018-03-12 VITALS — BP 126/80 | HR 65 | Resp 15 | Ht 69.0 in | Wt 228.0 lb

## 2018-03-12 DIAGNOSIS — M170 Bilateral primary osteoarthritis of knee: Secondary | ICD-10-CM

## 2018-03-12 DIAGNOSIS — M5136 Other intervertebral disc degeneration, lumbar region: Secondary | ICD-10-CM | POA: Diagnosis not present

## 2018-03-12 DIAGNOSIS — L409 Psoriasis, unspecified: Secondary | ICD-10-CM | POA: Diagnosis not present

## 2018-03-12 DIAGNOSIS — L4050 Arthropathic psoriasis, unspecified: Secondary | ICD-10-CM | POA: Diagnosis not present

## 2018-03-12 DIAGNOSIS — M19071 Primary osteoarthritis, right ankle and foot: Secondary | ICD-10-CM

## 2018-03-12 DIAGNOSIS — Z79899 Other long term (current) drug therapy: Secondary | ICD-10-CM

## 2018-03-12 DIAGNOSIS — M5030 Other cervical disc degeneration, unspecified cervical region: Secondary | ICD-10-CM

## 2018-03-12 DIAGNOSIS — M19041 Primary osteoarthritis, right hand: Secondary | ICD-10-CM | POA: Diagnosis not present

## 2018-03-12 DIAGNOSIS — Z96643 Presence of artificial hip joint, bilateral: Secondary | ICD-10-CM | POA: Diagnosis not present

## 2018-03-12 DIAGNOSIS — Z8679 Personal history of other diseases of the circulatory system: Secondary | ICD-10-CM

## 2018-03-12 DIAGNOSIS — M19072 Primary osteoarthritis, left ankle and foot: Secondary | ICD-10-CM

## 2018-03-12 DIAGNOSIS — Z87898 Personal history of other specified conditions: Secondary | ICD-10-CM

## 2018-03-12 DIAGNOSIS — M19042 Primary osteoarthritis, left hand: Secondary | ICD-10-CM

## 2018-03-12 DIAGNOSIS — Z8639 Personal history of other endocrine, nutritional and metabolic disease: Secondary | ICD-10-CM

## 2018-03-12 NOTE — Patient Instructions (Signed)
Standing Labs We placed an order today for your standing lab work.    Please come back and get your standing labs in January and then every 3 months.  We have open lab Monday through Friday from 8:30-11:30 AM and 1:30-4:00 PM  at the office of Dr. Bo Merino.   You may experience shorter wait times on Monday and Friday afternoons. The office is located at [address], [place], [place], [place] [address] No appointment is necessary.   Labs are drawn by [facility].  You may receive a bill from [facility] for your lab work. If you have any questions regarding directions or hours of operation,  please call [phone].   Just as a reminder please drink plenty of water prior to coming for your lab work. Thanks!  Vaccines You are taking a medication(s) that can suppress your immune system.  The following immunizations are recommended: . Flu annually . Pneumonia (Pneumovax 85 and Prevnar 13 after age 39) . Shingrix  Please check with your PCP to make sure you are up to date.

## 2018-03-21 ENCOUNTER — Encounter: Payer: Self-pay | Admitting: Family Medicine

## 2018-03-21 ENCOUNTER — Ambulatory Visit: Payer: 59

## 2018-03-21 ENCOUNTER — Other Ambulatory Visit: Payer: Self-pay

## 2018-03-21 ENCOUNTER — Telehealth: Payer: Self-pay

## 2018-03-21 ENCOUNTER — Ambulatory Visit (INDEPENDENT_AMBULATORY_CARE_PROVIDER_SITE_OTHER): Payer: 59 | Admitting: Family Medicine

## 2018-03-21 VITALS — BP 128/80 | HR 65 | Temp 97.6°F | Ht 66.0 in | Wt 225.0 lb

## 2018-03-21 DIAGNOSIS — Z0000 Encounter for general adult medical examination without abnormal findings: Secondary | ICD-10-CM | POA: Diagnosis not present

## 2018-03-21 DIAGNOSIS — Z125 Encounter for screening for malignant neoplasm of prostate: Secondary | ICD-10-CM | POA: Diagnosis not present

## 2018-03-21 DIAGNOSIS — Z23 Encounter for immunization: Secondary | ICD-10-CM

## 2018-03-21 LAB — LIPID PANEL
CHOL/HDL RATIO: 4
CHOLESTEROL: 206 mg/dL — AB (ref 0–200)
HDL: 56.1 mg/dL (ref >39.00)
LDL CALC: 133 mg/dL — AB (ref 0–99)
NonHDL: 149.99
TRIGLYCERIDES: 84 mg/dL (ref 0.0–149.0)
VLDL: 16.8 mg/dL (ref 0.0–40.0)

## 2018-03-21 LAB — TSH: TSH: 1.21 u[IU]/mL (ref 0.35–4.50)

## 2018-03-21 LAB — PSA: PSA: 0.89 ng/mL (ref 0.10–4.00)

## 2018-03-21 NOTE — Progress Notes (Signed)
Subjective:     Patient ID: [name], male   DOB: 15-Dec-1965, 52 y.o.   MRN: [id number]  HPI Patient seen for physical exam.  He has history of essential tremor, elevated blood pressure, psoriatic arthritis.  He is followed by rheumatology and is now on Enbrel.  Still needs flu vaccine.  No history of pneumonia vaccination.  No history of shingles vaccine.  He did Cologuard last year which was negative.  Previous hepatitis C screen negative.  History of pilonidal cyst excision.  has had some regrowth of cyst recently but no pain.  Non-smoker.  No regular alcohol use.  Past Medical History:  Diagnosis Date  . History of avascular necrosis of capital femoral epiphysis 2006, 2007   Bilateral Femoral Head  . HLD (hyperlipidemia)   . Psoriatic arthritis [facility])    Past Surgical History:  Procedure Laterality Date  . JOINT REPLACEMENT Bilateral 2006, 2007   Necrosis femoral head (bilateral)  . TOTAL HIP REVISION Right 11/23/2016   Procedure: Acetabulum liner and femoral head revision;  Surgeon: [name], MD;  Location: [facility];  Service: Orthopedics;  Laterality: Right;    reports that he has never smoked. He has never used smokeless tobacco. He reports that he drinks alcohol. He reports that he does not use drugs. family history includes Pneumonia in his father. Allergies  Allergen Reactions  . Gold-Containing Drug Products Rash    Denies oral or airway involvement - occurred in the 1980s.      Review of Systems  Constitutional: Negative for activity change, appetite change, fatigue and fever.  HENT: Negative for congestion, ear pain and trouble swallowing.   Eyes: Negative for pain and visual disturbance.  Respiratory: Negative for cough, shortness of breath and wheezing.   Cardiovascular: Negative for chest pain and palpitations.  Gastrointestinal: Negative for abdominal distention, abdominal pain, blood in stool, constipation, diarrhea, nausea, rectal pain and vomiting.   Endocrine: Negative for polydipsia and polyuria.  Genitourinary: Negative for dysuria, hematuria and testicular pain.  Musculoskeletal: Negative for arthralgias and joint swelling.  Skin: Negative for rash.  Neurological: Negative for dizziness, syncope and headaches.  Hematological: Negative for adenopathy.  Psychiatric/Behavioral: Negative for confusion and dysphoric mood.       Objective:   Physical Exam  Constitutional: He is oriented to person, place, and time. He appears well-developed and well-nourished. No distress.  HENT:  Head: Normocephalic and atraumatic.  Right Ear: External ear normal.  Left Ear: External ear normal.  Mouth/Throat: Oropharynx is clear and moist.  Eyes: Pupils are equal, round, and reactive to light. Conjunctivae and EOM are normal.  Neck: Normal range of motion. Neck supple. No thyromegaly present.  Cardiovascular: Normal rate, regular rhythm and normal heart sounds.  No murmur heard. Pulmonary/Chest: No respiratory distress. He has no wheezes. He has no rales.  Abdominal: Soft. Bowel sounds are normal. He exhibits no distension and no mass. There is no tenderness. There is no rebound and no guarding.  Musculoskeletal: He exhibits no edema.  Lymphadenopathy:    He has no cervical adenopathy.  Neurological: He is alert and oriented to person, place, and time. He displays normal reflexes. No cranial nerve deficit.  Skin: No rash noted.  He has small dimple in the sacral area and just to the right of midline small area of induration but no erythema.  No warmth.  No fluctuance.  Nontender.  Psychiatric: He has a normal mood and affect.       Assessment:  Physical exam.  He has history of psoriatic arthritis now treated with Enbrel and overall doing well.  Appears to have small residual growth from pilonidal weeks cyst excision.  We discussed several health maintenance issues as below    Plan:     -Flu vaccine given -Also recommended Prevnar 13  with his immunosuppressive therapy -Obtain TSH, lipid panel, PSA.  We did not check CBC or comprehensive chemistries because these were checked recently by rheumatology -Discussed shingles vaccine and he will check on insurance coverage  Eulas Post MD [facility]

## 2018-03-21 NOTE — Patient Instructions (Signed)
Consider shingles vaccine (shingrix) and let us know if interested.  

## 2018-03-21 NOTE — Addendum Note (Signed)
Addended by: Anibal Henderson on: 03/21/2018 10:42 AM   Modules accepted: Orders

## 2018-03-21 NOTE — Telephone Encounter (Signed)
Called patient to let him know that I found his silver tall cup in the room after he had left and I let him know that I have put his cup at the front desk lost and found section with his name on it. Patient stated that he will be by to pick it up.

## 2018-03-26 ENCOUNTER — Telehealth: Payer: Self-pay | Admitting: *Deleted

## 2018-03-26 NOTE — Telephone Encounter (Signed)
Copied from [place] [phone] [id number]. Topic: General - Other >> Mar 21, 2018 12:48 PM Yvette Rack wrote: Reason for CRM: Pt stated he contacted his insurance company and was advised that the Shingrix vaccine is covered. Pt would like to be contacted for scheduling. Cb# [phone] >> Mar 22, 2018 11:47 AM Cox, Melburn Hake, CMA wrote: Madaline Brilliant to give Shingrix?

## 2018-03-28 ENCOUNTER — Ambulatory Visit: Payer: 59

## 2018-04-02 ENCOUNTER — Ambulatory Visit (INDEPENDENT_AMBULATORY_CARE_PROVIDER_SITE_OTHER): Payer: 59

## 2018-04-02 DIAGNOSIS — Z23 Encounter for immunization: Secondary | ICD-10-CM

## 2018-04-02 NOTE — Progress Notes (Signed)
Per orders of Dr. [name], injection of Shingrix given by Franco Collet. Patient tolerated injection well.

## 2018-04-05 ENCOUNTER — Other Ambulatory Visit: Payer: Self-pay | Admitting: Pharmacist

## 2018-04-05 MED ORDER — ETANERCEPT 50 MG/ML ~~LOC~~ SOSY: PREFILLED_SYRINGE | SUBCUTANEOUS | 0 refills | Status: DC

## 2018-04-05 MED FILL — ENBREL 50 MG/ML SOSY: 50 | 28 days supply | Qty: 4 | Fill #0

## 2018-04-16 NOTE — Telephone Encounter (Signed)
Patient given Shingrix 04/02/18

## 2018-05-03 MED FILL — ENBREL 50 MG/ML SOSY: 50 | 28 days supply | Qty: 4 | Fill #1

## 2018-05-07 ENCOUNTER — Telehealth: Payer: Self-pay | Admitting: Pharmacy Technician

## 2018-05-07 NOTE — Telephone Encounter (Signed)
Received message form [facility], that patient copay this month for his Enbrel is $692.00. Per notes it look like patient may have a copay card. Left message for patient to see if he still has an Enbrel Copay card.  9:33 AM Beatriz Chancellor, CPhT

## 2018-05-07 NOTE — Telephone Encounter (Signed)
Received a fax from [facility] regarding a prior authorization for ENBREL. Authorization has been APPROVED from 05/07/2018 to 05/07/2019.   Will send document to scan center.  Authorization # [id number] Phone # [phone]

## 2018-05-07 NOTE — Telephone Encounter (Signed)
Spoke to patient, he will bring Enbrel Debit Copay Card to Pharmacy when he goes to pick up his medication. Advised patient to call office if he has any issues or questions.

## 2018-05-23 ENCOUNTER — Ambulatory Visit (INDEPENDENT_AMBULATORY_CARE_PROVIDER_SITE_OTHER): Payer: 59 | Admitting: *Deleted

## 2018-05-23 DIAGNOSIS — Z23 Encounter for immunization: Secondary | ICD-10-CM

## 2018-05-28 ENCOUNTER — Other Ambulatory Visit: Payer: Self-pay

## 2018-05-28 ENCOUNTER — Encounter: Payer: Self-pay | Admitting: Family Medicine

## 2018-05-28 ENCOUNTER — Ambulatory Visit (INDEPENDENT_AMBULATORY_CARE_PROVIDER_SITE_OTHER): Payer: 59 | Admitting: Family Medicine

## 2018-05-28 VITALS — BP 132/72 | HR 73 | Temp 98.3°F | Ht 66.0 in | Wt 236.1 lb

## 2018-05-28 DIAGNOSIS — M436 Torticollis: Secondary | ICD-10-CM | POA: Diagnosis not present

## 2018-05-28 NOTE — Progress Notes (Signed)
  Subjective:     Patient ID: [name], male   DOB: Mar 19, 1966, 53 y.o.   MRN: [id number]  HPI Patient is seen to discuss possible referral to physical therapy.  He has psoriatic arthritis and is getting regular Enbrel injections per rheumatology.  He has problems with stiff neck and decreasing range of motion in the neck.  Does not have any known history of ankylosing spondylitis.  He does have history of degenerative changes on previous x-rays of the neck including MRI back in 2012.  He states he has benefited from physical therapy previously.  He has tried some massage with only minimal temporary improvement  Denies any radiculitis symptoms.  No upper extremity numbness or weakness  Past Medical History:  Diagnosis Date  . History of avascular necrosis of capital femoral epiphysis 2006, 2007   Bilateral Femoral Head  . HLD (hyperlipidemia)   . Psoriatic arthritis [facility])    Past Surgical History:  Procedure Laterality Date  . JOINT REPLACEMENT Bilateral 2006, 2007   Necrosis femoral head (bilateral)  . TOTAL HIP REVISION Right 11/23/2016   Procedure: Acetabulum liner and femoral head revision;  Surgeon: [name], MD;  Location: [facility];  Service: Orthopedics;  Laterality: Right;    reports that he has never smoked. He has never used smokeless tobacco. He reports current alcohol use. He reports that he does not use drugs. family history includes Pneumonia in his father. Allergies  Allergen Reactions  . Gold-Containing Drug Products Rash    Denies oral or airway involvement - occurred in the 1980s.      Review of Systems  Cardiovascular: Negative for chest pain.  Neurological: Negative for weakness and numbness.       Objective:   Physical Exam Constitutional:      Appearance: Normal appearance.  Neck:     Musculoskeletal: No muscular tenderness.     Comments: Patient has very limited range of motion with neck extension, lateral bending, and rotation to either side.   Paracervical muscles are very tight to palpation Lymphadenopathy:     Cervical: No cervical adenopathy.  Neurological:     Mental Status: He is alert.        Assessment:     Patient has history of psoriatic arthritis.  He has decreasing range of motion cervical spine    Plan:     -Set up trial of physical therapy -Consider repeat plain films of neck if not improving with that  Eulas Post MD [facility]

## 2018-05-28 NOTE — Patient Instructions (Signed)
We will set up PT referral.

## 2018-05-30 ENCOUNTER — Encounter: Payer: Self-pay | Admitting: Physical Therapy

## 2018-05-30 ENCOUNTER — Ambulatory Visit: Payer: 59 | Attending: Family Medicine | Admitting: Physical Therapy

## 2018-05-30 ENCOUNTER — Other Ambulatory Visit: Payer: Self-pay

## 2018-05-30 DIAGNOSIS — R2689 Other abnormalities of gait and mobility: Secondary | ICD-10-CM | POA: Insufficient documentation

## 2018-05-30 DIAGNOSIS — R29898 Other symptoms and signs involving the musculoskeletal system: Secondary | ICD-10-CM | POA: Insufficient documentation

## 2018-05-30 DIAGNOSIS — M542 Cervicalgia: Secondary | ICD-10-CM | POA: Diagnosis not present

## 2018-05-30 NOTE — Therapy (Signed)
[facility] [address] [place], [place], [address] Phone: [phone]   Fax:  [phone]  Physical Therapy Evaluation  Patient Details  Name: [name] MRN: [id number] Date of Birth: 11-15-1965 Referring Provider (PT): Eulas Post, MD   Encounter Date: 05/30/2018  PT End of Session - 05/30/18 1019    Visit Number  1    Date for PT Re-Evaluation  07/25/18    Authorization Type  UMR    PT Start Time  0925    PT Stop Time  1010    PT Time Calculation (min)  45 min    Activity Tolerance  Patient tolerated treatment well    Behavior During Therapy  [facility] for tasks assessed/performed       Past Medical History:  Diagnosis Date  . History of avascular necrosis of capital femoral epiphysis 2006, 2007   Bilateral Femoral Head  . HLD (hyperlipidemia)   . Psoriatic arthritis [facility])     Past Surgical History:  Procedure Laterality Date  . JOINT REPLACEMENT Bilateral 2006, 2007   Necrosis femoral head (bilateral)  . TOTAL HIP REVISION Right 11/23/2016   Procedure: Acetabulum liner and femoral head revision;  Surgeon: [name], MD;  Location: [facility];  Service: Orthopedics;  Laterality: Right;    There were no vitals filed for this visit.   Subjective Assessment - 05/30/18 0933    Subjective  Pt referred to PT with diagnosis of neck stiffness due to psoriatic arthritis.  Began in 73s and has been on full disability since age 16.  No pain.  Does better with activity/exercise/massage, worse with driving, cold weather.  He used to go to a gym and he currently walks dogs.  Has access to a gym in his apartment complex.    Pertinent History  long time history of psoriatic arthritis, Bil THR, OA hands bil    Limitations  Sitting;Reading;Lifting    How long can you sit comfortably?  30 min    Patient Stated Goals  improve ROM, learn how to manage available range and maintain    Currently in Pain?  No/denies          [facility] PT Assessment - 05/30/18 0001      Assessment   Medical Diagnosis  M43.6 (ICD-10-CM) - Neck stiffness    Referring Provider (PT)  Burchette, [name], MD    Onset Date/Surgical Date  --   when Pt was in mid-20s   Hand Dominance  Right    Next MD Visit  no   sees rheumatologist in 1.5 mos   Prior Therapy  for lumbar      Precautions   Precautions  None   bil THR     Restrictions   Weight Bearing Restrictions  No      Balance Screen   Has the patient fallen in the past 6 months  No    Has the patient had a decrease in activity level because of a fear of falling?   No    Is the patient reluctant to leave their home because of a fear of falling?   No      Home Environment   Living Environment  Private residence    Living Arrangements  Spouse/significant other    Type of [place] to enter    Entrance Stairs-Number of Steps  1    Entrance Stairs-Rails  None  Home Layout  Two level    Alternate Level Stairs-Number of Steps  10    Alternate Level Stairs-Rails  Right   Pt reports no difficulty with stairs   Additional Comments  uses sock assister for donning socks, uses a reacher sometimes      Prior Function   Level of Independence  Independent    Vocation  On disability    Leisure  reading, family time, walk dogs      Cognition   Overall Cognitive Status  Within Functional Limits for tasks assessed      Observation/Other Assessments   Observations  Pt holds head in Lt lateral tilt    Focus on Therapeutic Outcomes (FOTO)   44%   goal 35%     Sensation   Light Touch  Appears Intact      Posture/Postural Control   Posture/Postural Control  Postural limitations    Postural Limitations  Decreased thoracic kyphosis;Decreased lumbar lordosis   Decreased cervical lordosis   Posture Comments  Pt holds head in Lt lateral tilt      ROM / Strength   AROM / PROM / Strength  AROM;Strength      AROM   Overall AROM   Deficits     Overall AROM Comments  bil shoulders WFL with exception of bil IR to low lumbar only    AROM Assessment Site  Cervical    Cervical Flexion  35    Cervical Extension  12    Cervical - Right Side Bend  15    Cervical - Left Side Bend  18    Cervical - Right Rotation  40    Cervical - Left Rotation  30      Strength   Overall Strength  Within functional limits for tasks performed    Overall Strength Comments  grossly 5/5 throughout cervical and UEs      Palpation   Spinal mobility  global stiffness throughout cervical spine, most limitation noted: poor joint mobility Rt O/A, Rt A/A, all U-joint side glides on Rt    Palpation comment  TTP: Lt > Rt upper trap      Special Tests   Other special tests  relief with manual cervical traction      Transfers   Comments  moves slower due to stiffness      Ambulation/Gait   Gait Comments  walks somewhat slowly due to stiffness                Objective measurements completed on examination: See above findings.              PT Education - 05/30/18 1018    Education Details  Access Code: [email or web address]     Person(s) Educated  Patient    Methods  Explanation;Demonstration;Verbal cues;Handout    Comprehension  Verbalized understanding;Returned demonstration       PT Short Term Goals - 05/30/18 1916      PT SHORT TERM GOAL #1   Title  Ind and compliant with initial HEP for frequent cervical ROM throughout day and postural adjustments during reading/computer time.    Time  3    Period  Weeks    Status  New    Target Date  06/20/18      PT SHORT TERM GOAL #2   Title  Pt will achieve at least 45 degrees bil neck rotation to improve functional activities such as turning head while driving.    Time  4  Period  Weeks    Status  New    Target Date  06/27/18      PT SHORT TERM GOAL #3   Title  Pt will be able to demonstrate improved seated cervical posture and strategy for elevating reading materials to reduce strain on  neck.    Time  3    Period  Weeks    Status  New    Target Date  06/20/18        PT Long Term Goals - 05/30/18 1921      PT LONG TERM GOAL #1   Title  Pt will be ind in advanced HEP.    Time  8    Period  Weeks    Status  New    Target Date  07/25/18      PT LONG TERM GOAL #2   Title  Pt will reduce FOTO to 35% demonstrating less limitation with functional activities.    Time  8    Period  Weeks    Status  New    Target Date  07/25/18      PT LONG TERM GOAL #3   Title  Pt will report plans to attend neighborhood gym or aquatic PT to maintain strength and ROM gains to allow greater ease of movement.    Time  8    Period  Weeks    Status  New    Target Date  07/25/18      PT LONG TERM GOAL #4   Title  Pt will report at least 30% greater ease of head/neck movements throughout daily activities.    Time  8    Period  Weeks    Status  New    Target Date  07/25/18             Plan - 05/30/18 1902    Clinical Impression Statement  Pt is a pleasant 53yo male who was diagnosed with psoriatic arthritis in his 79s.  He was referred to PT with the goal of improving ROM in his neck.  He has no pain.  He has reduced spinal curvatures throughout spine.  He has significant restrictions in all planes of cervical ROM with upper, mid and lower cervical joint hypomobilities noted, Rt>Lt.  His end feels suggest joint limitations are first barrier to ROM, although Lt>Rt upper traps are also limited and painful with palpable trigger points.  Bil shoulders are [facility] for range with exception of IR.  UEs and cervical strength is 5/5.  Pt states he needs to find his motivation again to use his neighborhood gym as exercise has helped his global stiffness.  PT encouraged him to break up his seated posture throughout the day by giving him simple cervical ROM exercises to perform each waking hour of static posture.  PT also educated him on cervical retraction for improved seated posture and to elevate  his reading materials to eye level whenever possible to avoid poor posture.  Pt will benefit from skilled PT to address ROM, posture, flexibility and maximizing functional abilities to improve quality of life.     History and Personal Factors relevant to plan of care:  bil THA, OA, psoriatic arthritis affecting many joints throughout body    Clinical Presentation  Stable    Clinical Decision Making  Low    Rehab Potential  Good    PT Frequency  2x / week    PT Duration  8 weeks    PT Treatment/Interventions  ADLs/Self Care Home Management;Aquatic Therapy;Electrical Stimulation;Moist Heat;Traction;Functional mobility training;Therapeutic exercise;Neuromuscular re-education;Patient/family education;Manual techniques;Passive range of motion;Dry needling;Taping;Energy conservation;Spinal Manipulations;Joint Manipulations    PT Next Visit Plan  manual techniques for cervical ROM, f/u on dry needling interest (discussed at eval), neck retraction (add to HEP), cervical ROM, stabilization    PT Home Exercise Plan  Access Code: [email or web address]     Recommended Other Services  consider referral into aquatic therapy with Myrene Galas when becomes available    Consulted and Agree with Plan of Care  Patient       Patient will benefit from skilled therapeutic intervention in order to improve the following deficits and impairments:  Improper body mechanics, Decreased mobility, Postural dysfunction, Decreased activity tolerance, Decreased endurance, Decreased range of motion, Hypomobility, Impaired UE functional use  Visit Diagnosis: Cervicalgia - Plan: PT plan of care cert/re-cert  Other symptoms and signs involving the musculoskeletal system - Plan: PT plan of care cert/re-cert     Problem List Patient Active Problem List   Diagnosis Date Noted  . Essential tremor 04/03/2017  . Elevated blood pressure reading 02/03/2017  . Failed total hip arthroplasty ([place]) 11/23/2016  . High risk medications (not  anticoagulants) long-term use 09/20/2016  . Bilateral hand pain 09/20/2016  . DDD (degenerative disc disease), cervical 09/20/2016  . History of total hip replacement, bilateral 09/20/2016  . DDD (degenerative disc disease), lumbar 09/20/2016  . Bilateral plantar fasciitis 09/20/2016  . Psoriasis 09/20/2016  . Primary osteoarthritis of both feet 09/20/2016  . Primary osteoarthritis of both hands 09/20/2016  . Primary osteoarthritis of both knees 09/20/2016  . Prediabetes 09/22/2014  . Hyperlipidemia 09/22/2014  . URI (upper respiratory infection) 07/07/2012  . Acute bronchitis 07/07/2012  . HYPERLIPIDEMIA 07/03/2008  . OBESITY 07/03/2008  . AVASCULAR NECROSIS, FEMORAL HEAD 07/03/2008  . Psoriatic arthritis ([place]) 05/02/2001    Brigitta Pricer, PT 05/30/18 7:29 PM   [facility] [address], [place], [place], [address] Phone: [phone]   Fax:  [phone]  Name: [name] MRN: [id number] Date of Birth: 1965-11-05

## 2018-05-30 NOTE — Patient Instructions (Signed)
Access Code: [email or web address]  URL: [email or web address]  Date: 05/30/2018  Prepared by: Venetia Night Beuhring   Exercises  Standing Cervical Sidebending AROM - 10 reps - 1 sets - 1x daily - 7x weekly  Neck Rotation - 10 reps - 1 sets - 1x daily - 7x weekly  Standing Cervical Flexion AROM - 10 reps - 1 sets - 1x daily - 7x weekly  Standing Cervical Extension AROM - 10 reps - 1 sets - 1x daily - 7x weekly  Patient Education  Trigger Point Dry Needling

## 2018-06-01 ENCOUNTER — Ambulatory Visit: Payer: 59 | Admitting: Physical Therapy

## 2018-06-01 DIAGNOSIS — M542 Cervicalgia: Secondary | ICD-10-CM | POA: Diagnosis not present

## 2018-06-01 DIAGNOSIS — R29898 Other symptoms and signs involving the musculoskeletal system: Secondary | ICD-10-CM | POA: Diagnosis not present

## 2018-06-01 DIAGNOSIS — R2689 Other abnormalities of gait and mobility: Secondary | ICD-10-CM | POA: Diagnosis not present

## 2018-06-01 NOTE — Therapy (Signed)
[facility] [address], [place], [place], [address] Phone: [phone]   Fax:  [phone]  Physical Therapy Treatment  Patient Details  Name: [name] MRN: [id number] Date of Birth: August 21, 1965 Referring Provider (PT): Eulas Post, MD   Encounter Date: 06/01/2018  PT End of Session - 06/01/18 0929    Visit Number  2    Date for PT Re-Evaluation  07/25/18    Authorization Type  UMR    PT Start Time  0850    PT Stop Time  0935    PT Time Calculation (min)  45 min    Activity Tolerance  Patient tolerated treatment well    Behavior During Therapy  [facility] for tasks assessed/performed       Past Medical History:  Diagnosis Date  . History of avascular necrosis of capital femoral epiphysis 2006, 2007   Bilateral Femoral Head  . HLD (hyperlipidemia)   . Psoriatic arthritis [facility])     Past Surgical History:  Procedure Laterality Date  . JOINT REPLACEMENT Bilateral 2006, 2007   Necrosis femoral head (bilateral)  . TOTAL HIP REVISION Right 11/23/2016   Procedure: Acetabulum liner and femoral head revision;  Surgeon: [name], MD;  Location: [facility];  Service: Orthopedics;  Laterality: Right;    There were no vitals filed for this visit.  Subjective Assessment - 06/01/18 0925    Subjective  Pt relays no pain but stiffness and tightness in his neck    Currently in Pain?  No/denies                       [facility] Adult PT Treatment/Exercise - 06/01/18 0001      Exercises   Exercises  Neck      Neck Exercises: Machines for Strengthening   UBE (Upper Arm Bike)  2 min fwd, 2 min backward      Neck Exercises: Theraband   Rows  Green;20 reps    Horizontal ABduction  20 reps;Red    Other Theraband Exercises  shoulder ext green X 20      Neck Exercises: Standing   Neck Retraction  20 reps    Other Standing Exercises  neck side bend and rotation 5 sec X 10 each      Modalities   Modalities  Moist Heat      Moist Heat Therapy   Number Minutes Moist Heat  7 Minutes    Moist Heat Location  Cervical      Manual Therapy   Manual therapy comments  PROM flexion, ext, side bend, rotation, subocciptial release, manual traction, side gliding gentle to tolerance               PT Short Term Goals - 05/30/18 1916      PT SHORT TERM GOAL #1   Title  Ind and compliant with initial HEP for frequent cervical ROM throughout day and postural adjustments during reading/computer time.    Time  3    Period  Weeks    Status  New    Target Date  06/20/18      PT SHORT TERM GOAL #2   Title  Pt will achieve at least 45 degrees bil neck rotation to improve functional activities such as turning head while driving.    Time  4    Period  Weeks    Status  New    Target Date  06/27/18  PT SHORT TERM GOAL #3   Title  Pt will be able to demonstrate improved seated cervical posture and strategy for elevating reading materials to reduce strain on neck.    Time  3    Period  Weeks    Status  New    Target Date  06/20/18        PT Long Term Goals - 05/30/18 1921      PT LONG TERM GOAL #1   Title  Pt will be ind in advanced HEP.    Time  8    Period  Weeks    Status  New    Target Date  07/25/18      PT LONG TERM GOAL #2   Title  Pt will reduce FOTO to 35% demonstrating less limitation with functional activities.    Time  8    Period  Weeks    Status  New    Target Date  07/25/18      PT LONG TERM GOAL #3   Title  Pt will report plans to attend neighborhood gym or aquatic PT to maintain strength and ROM gains to allow greater ease of movement.    Time  8    Period  Weeks    Status  New    Target Date  07/25/18      PT LONG TERM GOAL #4   Title  Pt will report at least 30% greater ease of head/neck movements throughout daily activities.    Time  8    Period  Weeks    Status  New    Target Date  07/25/18            Plan - 06/01/18 0941     Clinical Impression Statement  Session focused on improving ROM and posture today. He was treated with AROM, AAROM, PROM, and manual therapy to increase his neck ROM. He was given Tband for home use to incorporate some upper back resitance training. PT discussed possible DN and aquatic benefits for posible future interventions.     Rehab Potential  Good    PT Frequency  2x / week    PT Duration  8 weeks    PT Treatment/Interventions  ADLs/Self Care Home Management;Aquatic Therapy;Electrical Stimulation;Moist Heat;Traction;Functional mobility training;Therapeutic exercise;Neuromuscular re-education;Patient/family education;Manual techniques;Passive range of motion;Dry needling;Taping;Energy conservation;Spinal Manipulations;Joint Manipulations    PT Next Visit Plan  manual techniques for cervical ROM, f/u on dry needling interest (discussed at eval), neck retraction (add to HEP), cervical ROM, stabilization    PT Home Exercise Plan  Access Code: [email or web address] added rows, ext, H abd with Tband today    Consulted and Agree with Plan of Care  Patient       Patient will benefit from skilled therapeutic intervention in order to improve the following deficits and impairments:  Improper body mechanics, Decreased mobility, Postural dysfunction, Decreased activity tolerance, Decreased endurance, Decreased range of motion, Hypomobility, Impaired UE functional use  Visit Diagnosis: Cervicalgia  Other abnormalities of gait and mobility     Problem List Patient Active Problem List   Diagnosis Date Noted  . Essential tremor 04/03/2017  . Elevated blood pressure reading 02/03/2017  . Failed total hip arthroplasty ([place]) 11/23/2016  . High risk medications (not anticoagulants) long-term use 09/20/2016  . Bilateral hand pain 09/20/2016  . DDD (degenerative disc disease), cervical 09/20/2016  . History of total hip replacement, bilateral 09/20/2016  . DDD (degenerative disc disease), lumbar 09/20/2016  .  Bilateral plantar fasciitis  09/20/2016  . Psoriasis 09/20/2016  . Primary osteoarthritis of both feet 09/20/2016  . Primary osteoarthritis of both hands 09/20/2016  . Primary osteoarthritis of both knees 09/20/2016  . Prediabetes 09/22/2014  . Hyperlipidemia 09/22/2014  . URI (upper respiratory infection) 07/07/2012  . Acute bronchitis 07/07/2012  . HYPERLIPIDEMIA 07/03/2008  . OBESITY 07/03/2008  . AVASCULAR NECROSIS, FEMORAL HEAD 07/03/2008  . Psoriatic arthritis ([place]) 05/02/2001    Silvestre Mesi 06/01/2018, 9:54 AM  [facility] [address], [place], [place], [address] Phone: [phone]   Fax:  [phone]  Name: [name] MRN: [id number] Date of Birth: 02-16-1966

## 2018-06-01 NOTE — Patient Instructions (Signed)
Added HEP exercises: Rows and extensions Green X 20 ea, Horizontal abduction red X 20

## 2018-06-04 MED FILL — ENBREL 50 MG/ML SOSY: 50 | 28 days supply | Qty: 4 | Fill #2

## 2018-06-05 ENCOUNTER — Ambulatory Visit: Payer: 59 | Attending: Family Medicine | Admitting: Physical Therapy

## 2018-06-05 ENCOUNTER — Encounter: Payer: Self-pay | Admitting: Physical Therapy

## 2018-06-05 DIAGNOSIS — R29898 Other symptoms and signs involving the musculoskeletal system: Secondary | ICD-10-CM | POA: Diagnosis present

## 2018-06-05 DIAGNOSIS — R2689 Other abnormalities of gait and mobility: Secondary | ICD-10-CM | POA: Insufficient documentation

## 2018-06-05 DIAGNOSIS — M542 Cervicalgia: Secondary | ICD-10-CM | POA: Diagnosis present

## 2018-06-05 NOTE — Patient Instructions (Signed)
Access Code: [email or web address]  URL: [email or web address]  Date: 06/05/2018  Prepared by: Sherol Dade   Exercises  Standing Cervical Sidebending AROM - 10 reps - 1 sets - 1x daily - 7x weekly  Standing Cervical Flexion AROM - 10 reps - 1 sets - 1x daily - 7x weekly  Standing Row with Anchored Resistance - 15 reps - 2 sets - 1x daily - 7x weekly  Seated Shoulder Horizontal Abduction with Resistance - 15 reps - 2 sets - 1x daily - 7x weekly  Sidelying Thoracic Rotation - 15 reps - 1 sets - 2x daily - 7x weekly  Seated Assisted Cervical Rotation with Towel - 10 reps - 3 sets - 1x daily - 7x weekly  Mid-Lower Cervical Extension SNAG with Strap - 10 reps - 3 sets - 1x daily - 7x weekly  Patient Education  Trigger [facility] Dry Needling   [facility] [address], [place], [place] [address] Phone # [phone] Fax [phone]

## 2018-06-05 NOTE — Therapy (Signed)
[facility] [address], [place], [place], [address] Phone: [phone]   Fax:  [phone]  Physical Therapy Treatment  Patient Details  Name: [name] MRN: [id number] Date of Birth: [birth year] Referring Provider (PT): Eulas Post, MD   Encounter Date: 06/05/2018  PT End of Session - 06/05/18 1023    Visit Number  3    Date for PT Re-Evaluation  07/25/18    Authorization Type  UMR    PT Start Time  0930    PT Stop Time  1015    PT Time Calculation (min)  45 min    Activity Tolerance  Patient tolerated treatment well;No increased pain    Behavior During Therapy  WFL for tasks assessed/performed       Past Medical History:  Diagnosis Date  . History of avascular necrosis of capital femoral epiphysis 2006, 2007   Bilateral Femoral Head  . HLD (hyperlipidemia)   . Psoriatic arthritis [facility])     Past Surgical History:  Procedure Laterality Date  . JOINT REPLACEMENT Bilateral 2006, 2007   Necrosis femoral head (bilateral)  . TOTAL HIP REVISION Right 11/23/2016   Procedure: Acetabulum liner and femoral head revision;  Surgeon: [name], MD;  Location: [facility];  Service: Orthopedics;  Laterality: Right;    There were no vitals filed for this visit.  Subjective Assessment - 06/05/18 0931    Subjective  Pt states that he is working on his posture at home. He is doing his exercises as well. No pain currently.     Currently in Pain?  No/denies                       [facility] Adult PT Treatment/Exercise - 06/05/18 0001      Neck Exercises: Machines for Strengthening   UBE (Upper Arm Bike)  L1 x2 min forward//backward PT present to discuss HEP set up      Neck Exercises: Standing   Other Standing Exercises  BUE rows with green TB x15 reps, increased to blue TB x15 reps     Other Standing Exercises  BUE extension with blue TB x12 reps      Neck Exercises: Seated   Other Seated  Exercise  --    Other Seated Exercise  cervical rotation with towel x10 reps Lt and Rt; cervical extension with towel 2x10 reps       Neck Exercises: Sidelying   Other Sidelying Exercise  thoracic rotation x15 reps each direction      Manual Therapy   Manual Therapy  Joint mobilization    Joint Mobilization  CPAs T7 to C2 Grade III-IV x2 bouts; Lt cervical column PAs grade III-IV x2 bouts              PT Education - 06/05/18 1023    Education Details  technique with therex; implications for manual therapy and exercise vs aquatic exercise    Person(s) Educated  Patient    Methods  Explanation;Handout;Verbal cues    Comprehension  Verbalized understanding;Returned demonstration       PT Short Term Goals - 06/05/18 1029      PT SHORT TERM GOAL #1   Title  Ind and compliant with initial HEP for frequent cervical ROM throughout day and postural adjustments during reading/computer time.    Time  3    Period  Weeks    Status  Partially Met      PT  SHORT TERM GOAL #2   Title  Pt will achieve at least 45 degrees bil neck rotation to improve functional activities such as turning head while driving.    Time  4    Period  Weeks    Status  New      PT SHORT TERM GOAL #3   Title  Pt will be able to demonstrate improved seated cervical posture and strategy for elevating reading materials to reduce strain on neck.    Time  3    Period  Weeks    Status  New        PT Long Term Goals - 05/30/18 1921      PT LONG TERM GOAL #1   Title  Pt will be ind in advanced HEP.    Time  8    Period  Weeks    Status  New    Target Date  07/25/18      PT LONG TERM GOAL #2   Title  Pt will reduce FOTO to 35% demonstrating less limitation with functional activities.    Time  8    Period  Weeks    Status  New    Target Date  07/25/18      PT LONG TERM GOAL #3   Title  Pt will report plans to attend neighborhood gym or aquatic PT to maintain strength and ROM gains to allow greater ease  of movement.    Time  8    Period  Weeks    Status  New    Target Date  07/25/18      PT LONG TERM GOAL #4   Title  Pt will report at least 30% greater ease of head/neck movements throughout daily activities.    Time  8    Period  Weeks    Status  New    Target Date  07/25/18            Plan - 06/05/18 1025    Clinical Impression Statement  Pt arrived with questions regarding his HEP updates made last visit. Therapist reviewed set up and technique with scapular strengthening exercises and pt had good understanding end of session. Remainder of the session focused on therex and manual techniques to improve thoracic and cervical mobility. Pt was educated on self-mobilization techniques for home and required cues for set up with this. Pt's cervical and thoracic spine are hypomobile throughout and he would benefit from further work on this. Will continue with current POC.     Rehab Potential  Good    PT Frequency  2x / week    PT Duration  8 weeks    PT Treatment/Interventions  ADLs/Self Care Home Management;Aquatic Therapy;Electrical Stimulation;Moist Heat;Traction;Functional mobility training;Therapeutic exercise;Neuromuscular re-education;Patient/family education;Manual techniques;Passive range of motion;Dry needling;Taping;Energy conservation;Spinal Manipulations;Joint Manipulations    PT Next Visit Plan  cervical ROM measurement; cervical and thoracic mobilization; f/u on HEP; dry needling cervical paraspinals/multifidi    PT Home Exercise Plan  [id number]     Consulted and Agree with Plan of Care  Patient       Patient will benefit from skilled therapeutic intervention in order to improve the following deficits and impairments:  Improper body mechanics, Decreased mobility, Postural dysfunction, Decreased activity tolerance, Decreased endurance, Decreased range of motion, Hypomobility, Impaired UE functional use  Visit Diagnosis: Other abnormalities of gait and  mobility  Cervicalgia  Other symptoms and signs involving the musculoskeletal system     Problem List Patient Active Problem  List   Diagnosis Date Noted  . Essential tremor 04/03/2017  . Elevated blood pressure reading 02/03/2017  . Failed total hip arthroplasty ([place]) 11/23/2016  . High risk medications (not anticoagulants) long-term use 09/20/2016  . Bilateral hand pain 09/20/2016  . DDD (degenerative disc disease), cervical 09/20/2016  . History of total hip replacement, bilateral 09/20/2016  . DDD (degenerative disc disease), lumbar 09/20/2016  . Bilateral plantar fasciitis 09/20/2016  . Psoriasis 09/20/2016  . Primary osteoarthritis of both feet 09/20/2016  . Primary osteoarthritis of both hands 09/20/2016  . Primary osteoarthritis of both knees 09/20/2016  . Prediabetes 09/22/2014  . Hyperlipidemia 09/22/2014  . URI (upper respiratory infection) 07/07/2012  . Acute bronchitis 07/07/2012  . HYPERLIPIDEMIA 07/03/2008  . OBESITY 07/03/2008  . AVASCULAR NECROSIS, FEMORAL HEAD 07/03/2008  . Psoriatic arthritis ([place]) 05/02/2001     10:31 AM,06/05/18 Sherol Dade PT, DPT [place] [facility] [address] [place], [place], [address] Phone: [phone]   Fax:  [phone]  Name: [name] MRN: [id number] Date of Birth: [birth year]

## 2018-06-07 ENCOUNTER — Ambulatory Visit: Payer: 59 | Admitting: Physical Therapy

## 2018-06-07 ENCOUNTER — Encounter: Payer: Self-pay | Admitting: Physical Therapy

## 2018-06-07 DIAGNOSIS — R29898 Other symptoms and signs involving the musculoskeletal system: Secondary | ICD-10-CM | POA: Diagnosis not present

## 2018-06-07 DIAGNOSIS — R2689 Other abnormalities of gait and mobility: Secondary | ICD-10-CM | POA: Diagnosis not present

## 2018-06-07 DIAGNOSIS — M542 Cervicalgia: Secondary | ICD-10-CM

## 2018-06-07 NOTE — Therapy (Signed)
[facility] [address], [place], [place], [address] Phone: [phone]   Fax:  [phone]  Physical Therapy Treatment  Patient Details  Name: [name] MRN: [id number] Date of Birth: 01-01-1966 Referring Provider (PT): Eulas Post, MD   Encounter Date: 06/07/2018  PT End of Session - 06/07/18 0925    Visit Number  4    Date for PT Re-Evaluation  07/25/18    Authorization Type  UMR    PT Start Time  0847    PT Stop Time  0927    PT Time Calculation (min)  40 min    Activity Tolerance  Patient tolerated treatment well;No increased pain    Behavior During Therapy  WFL for tasks assessed/performed       Past Medical History:  Diagnosis Date  . History of avascular necrosis of capital femoral epiphysis 2006, 2007   Bilateral Femoral Head  . HLD (hyperlipidemia)   . Psoriatic arthritis [facility])     Past Surgical History:  Procedure Laterality Date  . JOINT REPLACEMENT Bilateral 2006, 2007   Necrosis femoral head (bilateral)  . TOTAL HIP REVISION Right 11/23/2016   Procedure: Acetabulum liner and femoral head revision;  Surgeon: [name], MD;  [facility]: [facility];  Service: Orthopedics;  Laterality: Right;    There were no vitals filed for this visit.  Subjective Assessment - 06/07/18 0849    Subjective  Pt states that he is doing well. He used the heating pad which seemed to feel really good. He has no pain currently.     Currently in Pain?  No/denies         [facility] PT Assessment - 06/07/18 0001      AROM   Cervical Extension  20    Cervical - Right Rotation  50    Cervical - Left Rotation  30                   [facility] Adult PT Treatment/Exercise - 06/07/18 0001      Neck Exercises: Seated   Cervical Rotation  Both;10 reps    Cervical Rotation Limitations  with towel assistance     Other Seated Exercise  thoracic extension over roll along T7-T9 x10 reps eaech     Other Seated  Exercise  B horizontal abduction with green TB x10 reps      Neck Exercises: Supine   Neck Retraction  10 reps    Neck Retraction Limitations  supine with tennis balls     Other Supine Exercise  scap retraction x15 reps       Manual Therapy   Manual therapy comments  seated thoracic rotation x10 reps therapist overpressure followed by 10 reps active rotation; thoracic extension mobilization with movement x5 reps from T7 to T4    Joint Mobilization  CPAs T7 to C2 Grade III-IV x2 bouts; Rt cervical column PAs grade III-IV x2 bouts; Lt rotation mobilization with active Lt rotation 2x10 reps              PT Education - 06/07/18 0928    Education Details  technique with HEP; self massage with tennis balls     Person(s) Educated  Patient    Methods  Explanation    Comprehension  Verbalized understanding       PT Short Term Goals - 06/07/18 1035      PT SHORT TERM GOAL #1   Title  Ind and compliant with initial  HEP for frequent cervical ROM throughout day and postural adjustments during reading/computer time.    Time  3    Period  Weeks    Status  Partially Met      PT SHORT TERM GOAL #2   Title  Pt will achieve at least 45 degrees bil neck rotation to improve functional activities such as turning head while driving.    Baseline  50 deg Rt    Time  4    Period  Weeks    Status  Partially Met      PT SHORT TERM GOAL #3   Title  Pt will be able to demonstrate improved seated cervical posture and strategy for elevating reading materials to reduce strain on neck.    Time  3    Period  Weeks    Status  New        PT Long Term Goals - 05/30/18 1921      PT LONG TERM GOAL #1   Title  Pt will be ind in advanced HEP.    Time  8    Period  Weeks    Status  New    Target Date  07/25/18      PT LONG TERM GOAL #2   Title  Pt will reduce FOTO to 35% demonstrating less limitation with functional activities.    Time  8    Period  Weeks    Status  New    Target Date  07/25/18       PT LONG TERM GOAL #3   Title  Pt will report plans to attend neighborhood gym or aquatic PT to maintain strength and ROM gains to allow greater ease of movement.    Time  8    Period  Weeks    Status  New    Target Date  07/25/18      PT LONG TERM GOAL #4   Title  Pt will report at least 30% greater ease of head/neck movements throughout daily activities.    Time  8    Period  Weeks    Status  New    Target Date  07/25/18            Plan - 06/07/18 0936    Clinical Impression Statement  Pt is making steady progress towards his goals, demonstrating atleast 10 deg improvement in his active cervical ROM to the Rt and with extension. Continued with manual treatment to increase cervical and thoracic spine mobility, and therapist reviewed pt's HEP to ensure proper technique with this. Pt was educated on self-mobilization of the cervical spine musculature at home and he had good understanding of this. Will continue with current POC.     Rehab Potential  Good    PT Frequency  2x / week    PT Duration  8 weeks    PT Treatment/Interventions  ADLs/Self Care Home Management;Aquatic Therapy;Electrical Stimulation;Moist Heat;Traction;Functional mobility training;Therapeutic exercise;Neuromuscular re-education;Patient/family education;Manual techniques;Passive range of motion;Dry needling;Taping;Energy conservation;Spinal Manipulations;Joint Manipulations    PT Next Visit Plan  cervical and thoracic mobilization; dry needling cervical paraspinals/multifidi    PT Home Exercise Plan  [id number]     Consulted and Agree with Plan of Care  Patient       Patient will benefit from skilled therapeutic intervention in order to improve the following deficits and impairments:  Improper body mechanics, Decreased mobility, Postural dysfunction, Decreased activity tolerance, Decreased endurance, Decreased range of motion, Hypomobility, Impaired UE functional use  Visit  Diagnosis: Other abnormalities of  gait and mobility  Cervicalgia  Other symptoms and signs involving the musculoskeletal system     Problem List Patient Active Problem List   Diagnosis Date Noted  . Essential tremor 04/03/2017  . Elevated blood pressure reading 02/03/2017  . Failed total hip arthroplasty ([place]) 11/23/2016  . High risk medications (not anticoagulants) long-term use 09/20/2016  . Bilateral hand pain 09/20/2016  . DDD (degenerative disc disease), cervical 09/20/2016  . History of total hip replacement, bilateral 09/20/2016  . DDD (degenerative disc disease), lumbar 09/20/2016  . Bilateral plantar fasciitis 09/20/2016  . Psoriasis 09/20/2016  . Primary osteoarthritis of both feet 09/20/2016  . Primary osteoarthritis of both hands 09/20/2016  . Primary osteoarthritis of both knees 09/20/2016  . Prediabetes 09/22/2014  . Hyperlipidemia 09/22/2014  . URI (upper respiratory infection) 07/07/2012  . Acute bronchitis 07/07/2012  . HYPERLIPIDEMIA 07/03/2008  . OBESITY 07/03/2008  . AVASCULAR NECROSIS, FEMORAL HEAD 07/03/2008  . Psoriatic arthritis ([place]) 05/02/2001    10:35 AM,06/07/18 Sherol Dade PT, DPT [place] [facility] [address] [place], [place], [address] Phone: [phone]   Fax:  [phone]  Name: [name] MRN: [id number] Date of Birth: 09/18/1965

## 2018-06-11 DIAGNOSIS — D1801 Hemangioma of skin and subcutaneous tissue: Secondary | ICD-10-CM | POA: Diagnosis not present

## 2018-06-11 DIAGNOSIS — L814 Other melanin hyperpigmentation: Secondary | ICD-10-CM | POA: Diagnosis not present

## 2018-06-11 DIAGNOSIS — L821 Other seborrheic keratosis: Secondary | ICD-10-CM | POA: Diagnosis not present

## 2018-06-11 DIAGNOSIS — D225 Melanocytic nevi of trunk: Secondary | ICD-10-CM | POA: Diagnosis not present

## 2018-06-12 ENCOUNTER — Encounter: Payer: Self-pay | Admitting: Physical Therapy

## 2018-06-12 ENCOUNTER — Ambulatory Visit: Payer: 59 | Admitting: Physical Therapy

## 2018-06-12 DIAGNOSIS — R2689 Other abnormalities of gait and mobility: Secondary | ICD-10-CM

## 2018-06-12 DIAGNOSIS — R29898 Other symptoms and signs involving the musculoskeletal system: Secondary | ICD-10-CM

## 2018-06-12 DIAGNOSIS — M542 Cervicalgia: Secondary | ICD-10-CM

## 2018-06-12 NOTE — Therapy (Signed)
[facility] [address], [place], [place], [address] Phone: [phone]   Fax:  [phone]  Physical Therapy Treatment  Patient Details  Name: [name] MRN: [id number] Date of Birth: 10-May-1965 Referring Provider (PT): Eulas Post, MD   Encounter Date: 06/12/2018  PT End of Session - 06/12/18 0928    Visit Number  5    Date for PT Re-Evaluation  07/25/18    Authorization Type  UMR    PT Start Time  0844    PT Stop Time  0927    PT Time Calculation (min)  43 min    Activity Tolerance  Patient tolerated treatment well;No increased pain    Behavior During Therapy  WFL for tasks assessed/performed       Past Medical History:  Diagnosis Date  . History of avascular necrosis of capital femoral epiphysis 2006, 2007   Bilateral Femoral Head  . HLD (hyperlipidemia)   . Psoriatic arthritis [facility])     Past Surgical History:  Procedure Laterality Date  . JOINT REPLACEMENT Bilateral 2006, 2007   Necrosis femoral head (bilateral)  . TOTAL HIP REVISION Right 11/23/2016   Procedure: Acetabulum liner and femoral head revision;  Surgeon: [name], MD;  [facility];  Service: Orthopedics;  Laterality: Right;    There were no vitals filed for this visit.  Subjective Assessment - 06/12/18 0847    Subjective  Pt states that he had increased stiffness and pain in his neck on Sunday. He used the heat and tennis balls regularly at home.    Currently in Pain?  No/denies         [facility] PT Assessment - 06/12/18 0001      AROM   Cervical - Left Rotation  40                   [facility] Adult PT Treatment/Exercise - 06/12/18 0001      Neck Exercises: Machines for Strengthening   UBE (Upper Arm Bike)  L1 x2 min forward//backward PT present to discuss HEP       Neck Exercises: Seated   Other Seated Exercise  B lat stretch over foam roll with end range thoracic extension x12 reps     Other Seated  Exercise  self massage with theracane for home       Manual Therapy   Manual therapy comments  skin rolling along thoracic and cervical fascia     Joint Mobilization  Grade III-IV CPAs from C2 to T5 x2 bouts; Lt cervical rotation MWM with hand placement along C3 to C7 both opening and closing; Rt and LT C2/C3 rotation mobilization grade III x2 bouts              PT Education - 06/12/18 0927    Education Details  anatomy of the spine; implications for manual treatment and noted improvements in cervical ROM    Person(s) Educated  Patient    Methods  Explanation;Demonstration    Comprehension  Verbalized understanding;Returned demonstration       PT Short Term Goals - 06/12/18 0931      PT SHORT TERM GOAL #1   Title  Ind and compliant with initial HEP for frequent cervical ROM throughout day and postural adjustments during reading/computer time.    Time  3    Period  Weeks    Status  Partially Met      PT SHORT TERM GOAL #2   Title  Pt will achieve at least 45 degrees bil neck rotation to improve functional activities such as turning head while driving.    Baseline  50 deg Rt    Time  4    Period  Weeks    Status  Partially Met      PT SHORT TERM GOAL #3   Title  Pt will be able to demonstrate improved seated cervical posture and strategy for elevating reading materials to reduce strain on neck.    Time  3    Period  Weeks    Status  New        PT Long Term Goals - 05/30/18 1921      PT LONG TERM GOAL #1   Title  Pt will be ind in advanced HEP.    Time  8    Period  Weeks    Status  New    Target Date  07/25/18      PT LONG TERM GOAL #2   Title  Pt will reduce FOTO to 35% demonstrating less limitation with functional activities.    Time  8    Period  Weeks    Status  New    Target Date  07/25/18      PT LONG TERM GOAL #3   Title  Pt will report plans to attend neighborhood gym or aquatic PT to maintain strength and ROM gains to allow greater ease of  movement.    Time  8    Period  Weeks    Status  New    Target Date  07/25/18      PT LONG TERM GOAL #4   Title  Pt will report at least 30% greater ease of head/neck movements throughout daily activities.    Time  8    Period  Weeks    Status  New    Target Date  07/25/18            Plan - 06/12/18 0929    Clinical Impression Statement  Pt continues to make steady progress towards improving cervical ROM, noting increase to 40 deg of active cervical rotation to the Lt this session. Pt is diligently working on his HEP and a majority of today's session focused on manual treatment to improve soft tissue and joint mobility. Pt had no reports of pain during or following today's session. Will continue with current POC.     Rehab Potential  Good    PT Frequency  2x / week    PT Duration  8 weeks    PT Treatment/Interventions  ADLs/Self Care Home Management;Aquatic Therapy;Electrical Stimulation;Moist Heat;Traction;Functional mobility training;Therapeutic exercise;Neuromuscular re-education;Patient/family education;Manual techniques;Passive range of motion;Dry needling;Taping;Energy conservation;Spinal Manipulations;Joint Manipulations    PT Next Visit Plan  cervical extension and rotation MWM; dry needling cervical paraspinals/multifidi if pt agreeable     PT Home Exercise Plan  [id number]     Consulted and Agree with Plan of Care  Patient       Patient will benefit from skilled therapeutic intervention in order to improve the following deficits and impairments:  Improper body mechanics, Decreased mobility, Postural dysfunction, Decreased activity tolerance, Decreased endurance, Decreased range of motion, Hypomobility, Impaired UE functional use  Visit Diagnosis: Cervicalgia  Other abnormalities of gait and mobility  Other symptoms and signs involving the musculoskeletal system     Problem List Patient Active Problem List   Diagnosis Date Noted  . Essential tremor 04/03/2017  .  Elevated blood pressure reading 02/03/2017  .  Failed total hip arthroplasty ([place]) 11/23/2016  . High risk medications (not anticoagulants) long-term use 09/20/2016  . Bilateral hand pain 09/20/2016  . DDD (degenerative disc disease), cervical 09/20/2016  . History of total hip replacement, bilateral 09/20/2016  . DDD (degenerative disc disease), lumbar 09/20/2016  . Bilateral plantar fasciitis 09/20/2016  . Psoriasis 09/20/2016  . Primary osteoarthritis of both feet 09/20/2016  . Primary osteoarthritis of both hands 09/20/2016  . Primary osteoarthritis of both knees 09/20/2016  . Prediabetes 09/22/2014  . Hyperlipidemia 09/22/2014  . URI (upper respiratory infection) 07/07/2012  . Acute bronchitis 07/07/2012  . HYPERLIPIDEMIA 07/03/2008  . OBESITY 07/03/2008  . AVASCULAR NECROSIS, FEMORAL HEAD 07/03/2008  . Psoriatic arthritis ([place]) 05/02/2001    10:19 AM,06/12/18 Sherol Dade PT, DPT [place] [facility] [address], [place], [place], [address] Phone: [phone]   Fax:  [phone]  Name: [name] MRN: [id number] Date of Birth: 1966/02/19

## 2018-06-14 ENCOUNTER — Ambulatory Visit: Payer: 59 | Admitting: Physical Therapy

## 2018-06-14 DIAGNOSIS — R29898 Other symptoms and signs involving the musculoskeletal system: Secondary | ICD-10-CM | POA: Diagnosis not present

## 2018-06-14 DIAGNOSIS — R2689 Other abnormalities of gait and mobility: Secondary | ICD-10-CM | POA: Diagnosis not present

## 2018-06-14 DIAGNOSIS — M542 Cervicalgia: Secondary | ICD-10-CM | POA: Diagnosis not present

## 2018-06-14 NOTE — Therapy (Signed)
[facility] [address], [place], [place], [address] Phone: [phone]   Fax:  [phone]  Physical Therapy Treatment  Patient Details  Name: [name] MRN: [id number] Date of Birth: February 18, 1966 Referring Provider (PT): Eulas Post, MD   Encounter Date: 06/14/2018  PT End of Session - 06/14/18 0929    Visit Number  6    Date for PT Re-Evaluation  07/25/18    Authorization Type  UMR    PT Start Time  0845    PT Stop Time  0927    PT Time Calculation (min)  42 min    Activity Tolerance  Patient tolerated treatment well;No increased pain    Behavior During Therapy  WFL for tasks assessed/performed       Past Medical History:  Diagnosis Date  . History of avascular necrosis of capital femoral epiphysis 2006, 2007   Bilateral Femoral Head  . HLD (hyperlipidemia)   . Psoriatic arthritis [facility])     Past Surgical History:  Procedure Laterality Date  . JOINT REPLACEMENT Bilateral 2006, 2007   Necrosis femoral head (bilateral)  . TOTAL HIP REVISION Right 11/23/2016   Procedure: Acetabulum liner and femoral head revision;  Surgeon: [name], MD;  Location: [facility];  Service: Orthopedics;  Laterality: Right;    There were no vitals filed for this visit.  Subjective Assessment - 06/14/18 0847    Subjective  Pt states that things are going well. He was stiff last night when winding down but he was able to use the heat and some of his neck stretches.     Currently in Pain?  No/denies                       [facility] Adult PT Treatment/Exercise - 06/14/18 0001      Neck Exercises: Machines for Strengthening   UBE (Upper Arm Bike)  L1 x2 min forward//backward PT present to discuss HEP       Neck Exercises: Seated   Other Seated Exercise  thoracic rotation/lateral flexion/flexion/extension x10 reps       Neck Exercises: Sidelying   Other Sidelying Exercise  thoracic rotation with yellow TB  resistance x15 reps each, without TB x10 reps each      Manual Therapy   Joint Mobilization  Grade IV CPAs cervical spine x1 bout; Lt rotation MWM 2x10 reps with over pressure and therapist pressure along Lt and Rt C3 each set        Trigger Point Dry Needling - 06/14/18 0902    Consent Given?  Yes    Education Handout Provided  Yes    Muscles Treated Upper Body  Upper trapezius   (+) twitch response Lt multifidi C4, C5, C6   Upper Trapezius Response  Twitch reponse elicited;Palpable increased muscle length           PT Education - 06/14/18 0928    Education Details  implications for dry needling; increased HEP completion over weekend     Person(s) Educated  Patient    Methods  Explanation    Comprehension  Verbalized understanding       PT Short Term Goals - 06/12/18 0931      PT SHORT TERM GOAL #1   Title  Ind and compliant with initial HEP for frequent cervical ROM throughout day and postural adjustments during reading/computer time.    Time  3    Period  Weeks  Status  Partially Met      PT SHORT TERM GOAL #2   Title  Pt will achieve at least 45 degrees bil neck rotation to improve functional activities such as turning head while driving.    Baseline  50 deg Rt    Time  4    Period  Weeks    Status  Partially Met      PT SHORT TERM GOAL #3   Title  Pt will be able to demonstrate improved seated cervical posture and strategy for elevating reading materials to reduce strain on neck.    Time  3    Period  Weeks    Status  New        PT Long Term Goals - 05/30/18 1921      PT LONG TERM GOAL #1   Title  Pt will be ind in advanced HEP.    Time  8    Period  Weeks    Status  New    Target Date  07/25/18      PT LONG TERM GOAL #2   Title  Pt will reduce FOTO to 35% demonstrating less limitation with functional activities.    Time  8    Period  Weeks    Status  New    Target Date  07/25/18      PT LONG TERM GOAL #3   Title  Pt will report plans to  attend neighborhood gym or aquatic PT to maintain strength and ROM gains to allow greater ease of movement.    Time  8    Period  Weeks    Status  New    Target Date  07/25/18      PT LONG TERM GOAL #4   Title  Pt will report at least 30% greater ease of head/neck movements throughout daily activities.    Time  8    Period  Weeks    Status  New    Target Date  07/25/18            Plan - 06/14/18 0929    Clinical Impression Statement  Pt was agreeable to dry needling treatment this session. Due to fascia mobility restrictions, it was difficult to reach the multifidi during dry needling of the cervical spine. Also completed manual treatment to improve cervical spine mobility and encouraged pt to increase HEP adherence over the weekend. Will continue with current POC.     Rehab Potential  Good    PT Frequency  2x / week    PT Duration  8 weeks    PT Treatment/Interventions  ADLs/Self Care Home Management;Aquatic Therapy;Electrical Stimulation;Moist Heat;Traction;Functional mobility training;Therapeutic exercise;Neuromuscular re-education;Patient/family education;Manual techniques;Passive range of motion;Dry needling;Taping;Energy conservation;Spinal Manipulations;Joint Manipulations    PT Next Visit Plan  f/u on d/n; cervical extension and rotation MWM; cervical retraction    PT Home Exercise Plan  [email or web address]     Consulted and Agree with Plan of Care  Patient       Patient will benefit from skilled therapeutic intervention in order to improve the following deficits and impairments:  Improper body mechanics, Decreased mobility, Postural dysfunction, Decreased activity tolerance, Decreased endurance, Decreased range of motion, Hypomobility, Impaired UE functional use  Visit Diagnosis: Cervicalgia  Other abnormalities of gait and mobility  Other symptoms and signs involving the musculoskeletal system     Problem List Patient Active Problem List   Diagnosis Date Noted  .  Essential tremor 04/03/2017  . Elevated blood pressure   reading 02/03/2017  . 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/2016  . High risk medications (not anticoagulants) long-term use 09/20/2016  . Bilateral hand pain 09/20/2016  . DDD (degenerative disc disease), cervical 09/20/2016  . History of total hip replacement, bilateral 09/20/2016  . DDD (degenerative disc disease), lumbar 09/20/2016  . Bilateral plantar fasciitis 09/20/2016  . Psoriasis 09/20/2016  . Primary osteoarthritis of both feet 09/20/2016  . Primary osteoarthritis of both hands 09/20/2016  . Primary osteoarthritis of both knees 09/20/2016  . Prediabetes 09/22/2014  . Hyperlipidemia 09/22/2014  . URI (upper respiratory infection) 07/07/2012  . Acute bronchitis 07/07/2012  . HYPERLIPIDEMIA 07/03/2008  . OBESITY 07/03/2008  . AVASCULAR NECROSIS, FEMORAL HEAD 07/03/2008  . Psoriatic arthritis (HCC) 05/02/2001    10:09 AM,06/14/18 Sara Costella PT, DPT [facility]  [phone]  [facility] [address], [address] [place], [place], [address] Phone: [phone]   Fax:  [phone]  Name: Noach M Mcmichen MRN: [id number] Date of Birth: 06/09/1965   

## 2018-06-19 ENCOUNTER — Encounter: Payer: Self-pay | Admitting: Physical Therapy

## 2018-06-19 ENCOUNTER — Ambulatory Visit: Payer: 59 | Admitting: Physical Therapy

## 2018-06-19 DIAGNOSIS — M542 Cervicalgia: Secondary | ICD-10-CM | POA: Diagnosis not present

## 2018-06-19 DIAGNOSIS — R2689 Other abnormalities of gait and mobility: Secondary | ICD-10-CM | POA: Diagnosis not present

## 2018-06-19 DIAGNOSIS — R29898 Other symptoms and signs involving the musculoskeletal system: Secondary | ICD-10-CM | POA: Diagnosis not present

## 2018-06-19 NOTE — Therapy (Addendum)
[facility] [address], [place], [place], [address] Phone: [phone]   Fax:  [phone]  Physical Therapy Treatment  Patient Details  Name: [name] MRN: [id number] Date of Birth: May 28, 1965 Referring Provider (PT): Eulas Post, MD   Encounter Date: 06/19/2018  PT End of Session - 06/19/18 0928    Visit Number  7    Date for PT Re-Evaluation  07/25/18    Authorization Type  UMR    PT Start Time  0845    PT Stop Time  0927    PT Time Calculation (min)  42 min    Activity Tolerance  Patient tolerated treatment well;No increased pain    Behavior During Therapy  WFL for tasks assessed/performed       Past Medical History:  Diagnosis Date  . History of avascular necrosis of capital femoral epiphysis 2006, 2007   Bilateral Femoral Head  . HLD (hyperlipidemia)   . Psoriatic arthritis [facility])     Past Surgical History:  Procedure Laterality Date  . JOINT REPLACEMENT Bilateral 2006, 2007   Necrosis femoral head (bilateral)  . TOTAL HIP REVISION Right 11/23/2016   Procedure: Acetabulum liner and femoral head revision;  Surgeon: [name], MD;  [facility];  Service: Orthopedics;  Laterality: Right;    There were no vitals filed for this visit.  Subjective Assessment - 06/19/18 0921    Subjective  Pt states that he was able to sleep without pain following the dry needling. He                        [facility] Adult PT Treatment/Exercise - 06/19/18 0001      Neck Exercises: Seated   Cervical Isometrics  Right rotation;Left rotation;Right lateral flexion;Left lateral flexion;10 secs;5 reps    Other Seated Exercise  Rt 1st rib mobilization with sheet x15 reps       Neck Exercises: Supine   Neck Retraction  10 reps    Capital Flexion  10 reps      Manual Therapy   Joint Mobilization  Grade III-IV Rt C2-C5 PAs x3 bouts, Rt medial glide C5-C7 x4 bouts; CPAs grade III-IV x1 bout  C2-C5; cervical rotation Lt MWM x10 reps with pt overpressure C5, C4, C3; Rt inferior 1st rib mobilization x2 bouts              PT Education - 06/19/18 0928    Education Details  technique with therex; updated and reviewed HEP    Person(s) Educated  Patient    Methods  Handout;Explanation;Verbal cues    Comprehension  Verbalized understanding       PT Short Term Goals - 06/12/18 0931      PT SHORT TERM GOAL #1   Title  Ind and compliant with initial HEP for frequent cervical ROM throughout day and postural adjustments during reading/computer time.    Time  3    Period  Weeks    Status  Partially Met      PT SHORT TERM GOAL #2   Title  Pt will achieve at least 45 degrees bil neck rotation to improve functional activities such as turning head while driving.    Baseline  50 deg Rt    Time  4    Period  Weeks    Status  Partially Met      PT SHORT TERM GOAL #3   Title  Pt will be able  to demonstrate improved seated cervical posture and strategy for elevating reading materials to reduce strain on neck.    Time  3    Period  Weeks    Status  New        PT Long Term Goals - 05/30/18 1921      PT LONG TERM GOAL #1   Title  Pt will be ind in advanced HEP.    Time  8    Period  Weeks    Status  New    Target Date  07/25/18      PT LONG TERM GOAL #2   Title  Pt will reduce FOTO to 35% demonstrating less limitation with functional activities.    Time  8    Period  Weeks    Status  New    Target Date  07/25/18      PT LONG TERM GOAL #3   Title  Pt will report plans to attend neighborhood gym or aquatic PT to maintain strength and ROM gains to allow greater ease of movement.    Time  8    Period  Weeks    Status  New    Target Date  07/25/18      PT LONG TERM GOAL #4   Title  Pt will report at least 30% greater ease of head/neck movements throughout daily activities.    Time  8    Period  Weeks    Status  New    Target Date  07/25/18            Plan -  06/19/18 0929    Clinical Impression Statement  Pt had good response to dry needling last session, with reports of pain resolution for the past 5 days during his sleep. Session focused primarily on manual treatment to improve cervical rotation and lateral flexion. Pt is notably hypomobile throughout the cervical spine as well as limited first rib mobility. Pt was able to complete neck retraction with good understanding. HEP was updated end of session to further promote cervical ROM and strength gains. Pt noted no increase in pain end of session.     Rehab Potential  Good    PT Frequency  2x / week    PT Duration  8 weeks    PT Treatment/Interventions  ADLs/Self Care Home Management;Aquatic Therapy;Electrical Stimulation;Moist Heat;Traction;Functional mobility training;Therapeutic exercise;Neuromuscular re-education;Patient/family education;Manual techniques;Passive range of motion;Dry needling;Taping;Energy conservation;Spinal Manipulations;Joint Manipulations    PT Next Visit Plan  possible d/n to multifidi if needed; cervical extension and rotation MWM; cervical retraction    PT Home Exercise Plan  [email or web address]     Consulted and Agree with Plan of Care  Patient       Patient will benefit from skilled therapeutic intervention in order to improve the following deficits and impairments:  Improper body mechanics, Decreased mobility, Postural dysfunction, Decreased activity tolerance, Decreased endurance, Decreased range of motion, Hypomobility, Impaired UE functional use  Visit Diagnosis: Cervicalgia  Other abnormalities of gait and mobility  Other symptoms and signs involving the musculoskeletal system     Problem List Patient Active Problem List   Diagnosis Date Noted  . Essential tremor 04/03/2017  . Elevated blood pressure reading 02/03/2017  . Failed total hip arthroplasty ([place]) 11/23/2016  . High risk medications (not anticoagulants) long-term use 09/20/2016  . Bilateral hand pain  09/20/2016  . DDD (degenerative disc disease), cervical 09/20/2016  . History of total hip replacement, bilateral 09/20/2016  . DDD (degenerative disc disease),  lumbar 09/20/2016  . Bilateral plantar fasciitis 09/20/2016  . Psoriasis 09/20/2016  . Primary osteoarthritis of both feet 09/20/2016  . Primary osteoarthritis of both hands 09/20/2016  . Primary osteoarthritis of both knees 09/20/2016  . Prediabetes 09/22/2014  . Hyperlipidemia 09/22/2014  . URI (upper respiratory infection) 07/07/2012  . Acute bronchitis 07/07/2012  . HYPERLIPIDEMIA 07/03/2008  . OBESITY 07/03/2008  . AVASCULAR NECROSIS, FEMORAL HEAD 07/03/2008  . Psoriatic arthritis ([place]) 05/02/2001    9:34 AM,06/19/18 Sherol Dade PT, DPT [place] [facility] [address], [place], [place], [address] Phone: [phone]   Fax:  [phone]  Name: [name] MRN: [id number] Date of Birth: May 25, 1965

## 2018-06-19 NOTE — Patient Instructions (Signed)
Access Code: [email or web address]  URL: [email or web address]  Date: 06/19/2018  Prepared by: Sherol Dade   Exercises  Standing Row with Anchored Resistance - 15 reps - 2 sets - 1x daily - 7x weekly  Sidelying Thoracic Rotation - 15 reps - 1 sets - 2x daily - 7x weekly  Seated Isometric Cervical Sidebending - 5 reps - 10 hold - 2x daily - 7x weekly  Supine Chin Tuck - 15 reps - 2x daily - 7x weekly  Seated Assisted Cervical Rotation with Towel - 10 reps - 3 sets - 2x daily - 7x weekly  Mid-Lower Cervical Extension SNAG with Strap - 10 reps - 3 sets - 2x daily - 7x weekly  Patient Education  Trigger [facility] Dry Needling   [facility] [address] [place], [place] [address] Phone # [phone] Fax [phone]

## 2018-06-21 ENCOUNTER — Ambulatory Visit: Payer: 59 | Admitting: Physical Therapy

## 2018-06-21 DIAGNOSIS — R29898 Other symptoms and signs involving the musculoskeletal system: Secondary | ICD-10-CM

## 2018-06-21 DIAGNOSIS — R2689 Other abnormalities of gait and mobility: Secondary | ICD-10-CM

## 2018-06-21 DIAGNOSIS — M542 Cervicalgia: Secondary | ICD-10-CM | POA: Diagnosis not present

## 2018-06-21 NOTE — Therapy (Signed)
[facility] [address] [place], [place], [address] Phone: [phone]   Fax:  [phone]  Physical Therapy Treatment  Patient Details  Name: LENIX KIDD MRN: [id number] Date of Birth: 16-Jul-1965 Referring Provider (PT): Eulas Post, MD   Encounter Date: 06/21/2018  PT End of Session - 06/21/18 0846    Visit Number  8    Date for PT Re-Evaluation  07/25/18    Authorization Type  UMR    PT Start Time  0845    PT Stop Time  0927    PT Time Calculation (min)  42 min    Activity Tolerance  Patient tolerated treatment well;No increased pain    Behavior During Therapy  WFL for tasks assessed/performed       Past Medical History:  Diagnosis Date  . History of avascular necrosis of capital femoral epiphysis 2006, 2007   Bilateral Femoral Head  . HLD (hyperlipidemia)   . Psoriatic arthritis [facility])     Past Surgical History:  Procedure Laterality Date  . JOINT REPLACEMENT Bilateral 2006, 2007   Necrosis femoral head (bilateral)  . TOTAL HIP REVISION Right 11/23/2016   Procedure: Acetabulum liner and femoral head revision;  Surgeon: [name], MD;  Location: [facility];  Service: Orthopedics;  Laterality: Right;    There were no vitals filed for this visit.      [facility] PT Assessment - 06/21/18 0001      Observation/Other Assessments   Focus on Therapeutic Outcomes (FOTO)   41% limited                    [facility] Adult PT Treatment/Exercise - 06/21/18 0001      Exercises   Exercises  Other Exercises    Other Exercises   BUE weight into dumbells on mat table with neck retraction 10x5 sec       Neck Exercises: Machines for Strengthening   UBE (Upper Arm Bike)  L2 x2 min forward/backward PT present to discuss progress       Neck Exercises: Seated   Neck Retraction  10 reps;5 secs    Neck Retraction Limitations  verbal cues needed    Other Seated Exercise  thoracic/cervical rotation Lt  and Rt with therapist overpressure x10 reps      Neck Exercises: Supine   Neck Retraction  15 reps    Neck Retraction Limitations  heat applied to neck    Other Supine Exercise  neck retraction with horizontal abduction using red TB x15 reps       Moist Heat Therapy   Number Minutes Moist Heat  --   during supine therex   Moist Heat Location  Cervical      Manual Therapy   Joint Mobilization  Grade III-IV Lt lateral glides C4 to C7 x3 bouts; CPAs grade III-IV from C3 to C6 x2 bouts              PT Education - 06/21/18 0941    Education Details  technique with therex    Person(s) Educated  Patient    Methods  Explanation;Verbal cues    Comprehension  Verbalized understanding;Returned demonstration       PT Short Term Goals - 06/21/18 1003      PT SHORT TERM GOAL #1   Title  Ind and compliant with initial HEP for frequent cervical ROM throughout day and postural adjustments during reading/computer time.    Time  3  Period  Weeks    Status  Partially Met      PT SHORT TERM GOAL #2   Title  Pt will achieve at least 45 degrees bil neck rotation to improve functional activities such as turning head while driving.    Baseline  50 deg Rt    Time  4    Period  Weeks    Status  Partially Met      PT SHORT TERM GOAL #3   Title  Pt will be able to demonstrate improved seated cervical posture and strategy for elevating reading materials to reduce strain on neck.    Time  3    Period  Weeks    Status  New        PT Long Term Goals - 05/30/18 1921      PT LONG TERM GOAL #1   Title  Pt will be ind in advanced HEP.    Time  8    Period  Weeks    Status  New    Target Date  07/25/18      PT LONG TERM GOAL #2   Title  Pt will reduce FOTO to 35% demonstrating less limitation with functional activities.    Time  8    Period  Weeks    Status  New    Target Date  07/25/18      PT LONG TERM GOAL #3   Title  Pt will report plans to attend neighborhood gym or aquatic  PT to maintain strength and ROM gains to allow greater ease of movement.    Time  8    Period  Weeks    Status  New    Target Date  07/25/18      PT LONG TERM GOAL #4   Title  Pt will report at least 30% greater ease of head/neck movements throughout daily activities.    Time  8    Period  Weeks    Status  New    Target Date  07/25/18            Plan - 06/21/18 0941    Clinical Impression Statement  Pt is making steady progress towards his goals. He notes improvements in stiffness when driving and checking his blind spots. Pt's FOTO score is improved to 41% limitation. Continued with therex to promote cervical muscle strength and anterior neck activation. Pt was able to complete upright neck retraction this visit with less cuing required in previous sessions. Therapist also completed cervical mobilization with increase in lateral flexion to 20 deg to the Lt. Pt was encouraged to apply heat more consistently throughout the day and he was agreeable with this.    Rehab Potential  Good    PT Frequency  2x / week    PT Duration  8 weeks    PT Treatment/Interventions  ADLs/Self Care Home Management;Aquatic Therapy;Electrical Stimulation;Moist Heat;Traction;Functional mobility training;Therapeutic exercise;Neuromuscular re-education;Patient/family education;Manual techniques;Passive range of motion;Dry needling;Taping;Energy conservation;Spinal Manipulations;Joint Manipulations    PT Next Visit Plan  possible d/n to multifidi if needed; seated thoracic rotation/strength; cervical extension and rotation MWM; cervical retraction    PT Home Exercise Plan  [email or web address]     Consulted and Agree with Plan of Care  Patient       Patient will benefit from skilled therapeutic intervention in order to improve the following deficits and impairments:  Improper body mechanics, Decreased mobility, Postural dysfunction, Decreased activity tolerance, Decreased endurance, Decreased range of motion, Hypomobility,  Impaired UE functional use  Visit Diagnosis: Cervicalgia  Other abnormalities of gait and mobility  Other symptoms and signs involving the musculoskeletal system     Problem List Patient Active Problem List   Diagnosis Date Noted  . Essential tremor 04/03/2017  . Elevated blood pressure reading 02/03/2017  . Failed total hip arthroplasty ([place]) 11/23/2016  . High risk medications (not anticoagulants) long-term use 09/20/2016  . Bilateral hand pain 09/20/2016  . DDD (degenerative disc disease), cervical 09/20/2016  . History of total hip replacement, bilateral 09/20/2016  . DDD (degenerative disc disease), lumbar 09/20/2016  . Bilateral plantar fasciitis 09/20/2016  . Psoriasis 09/20/2016  . Primary osteoarthritis of both feet 09/20/2016  . Primary osteoarthritis of both hands 09/20/2016  . Primary osteoarthritis of both knees 09/20/2016  . Prediabetes 09/22/2014  . Hyperlipidemia 09/22/2014  . URI (upper respiratory infection) 07/07/2012  . Acute bronchitis 07/07/2012  . HYPERLIPIDEMIA 07/03/2008  . OBESITY 07/03/2008  . AVASCULAR NECROSIS, FEMORAL HEAD 07/03/2008  . Psoriatic arthritis ([place]) 05/02/2001   10:06 AM,06/21/18 Sherol Dade PT, DPT [place] [facility] [address], [place], [place], [address] Phone: [phone]   Fax:  [phone]  Name: JATAVION PEASTER MRN: [id number] Date of Birth: January 18, 1966

## 2018-06-22 ENCOUNTER — Encounter: Payer: Self-pay | Admitting: Rheumatology

## 2018-06-22 ENCOUNTER — Ambulatory Visit (INDEPENDENT_AMBULATORY_CARE_PROVIDER_SITE_OTHER): Payer: 59 | Admitting: Rheumatology

## 2018-06-22 VITALS — BP 159/92 | HR 74 | Resp 14 | Ht 70.0 in | Wt 235.0 lb

## 2018-06-22 DIAGNOSIS — L4050 Arthropathic psoriasis, unspecified: Secondary | ICD-10-CM | POA: Diagnosis not present

## 2018-06-22 DIAGNOSIS — Z96643 Presence of artificial hip joint, bilateral: Secondary | ICD-10-CM

## 2018-06-22 DIAGNOSIS — M19071 Primary osteoarthritis, right ankle and foot: Secondary | ICD-10-CM

## 2018-06-22 DIAGNOSIS — Z79899 Other long term (current) drug therapy: Secondary | ICD-10-CM | POA: Diagnosis not present

## 2018-06-22 DIAGNOSIS — M170 Bilateral primary osteoarthritis of knee: Secondary | ICD-10-CM

## 2018-06-22 DIAGNOSIS — R5383 Other fatigue: Secondary | ICD-10-CM | POA: Diagnosis not present

## 2018-06-22 DIAGNOSIS — M19041 Primary osteoarthritis, right hand: Secondary | ICD-10-CM | POA: Diagnosis not present

## 2018-06-22 DIAGNOSIS — M5136 Other intervertebral disc degeneration, lumbar region: Secondary | ICD-10-CM | POA: Diagnosis not present

## 2018-06-22 DIAGNOSIS — M5030 Other cervical disc degeneration, unspecified cervical region: Secondary | ICD-10-CM | POA: Diagnosis not present

## 2018-06-22 DIAGNOSIS — Z87898 Personal history of other specified conditions: Secondary | ICD-10-CM

## 2018-06-22 DIAGNOSIS — L409 Psoriasis, unspecified: Secondary | ICD-10-CM

## 2018-06-22 DIAGNOSIS — M51369 Other intervertebral disc degeneration, lumbar region without mention of lumbar back pain or lower extremity pain: Secondary | ICD-10-CM

## 2018-06-22 DIAGNOSIS — M19042 Primary osteoarthritis, left hand: Secondary | ICD-10-CM

## 2018-06-22 DIAGNOSIS — Z8679 Personal history of other diseases of the circulatory system: Secondary | ICD-10-CM

## 2018-06-22 DIAGNOSIS — M19072 Primary osteoarthritis, left ankle and foot: Secondary | ICD-10-CM

## 2018-06-22 DIAGNOSIS — Z8639 Personal history of other endocrine, nutritional and metabolic disease: Secondary | ICD-10-CM

## 2018-06-22 NOTE — Progress Notes (Signed)
Office Visit Note  Patient: [name]             Date of Birth: 1965-06-01           MRN: [id number]             PCP: Eulas Post, MD Referring: Eulas Post, MD Visit Date: 06/22/2018 Occupation: @GUAROCC @  Subjective:  Neck and generalized stiffness.   History of Present Illness: [name] is a 53 y.o. male with history of psoriatic arthritis psoriasis, osteoarthritis and degenerative disc disease.  He states he has been having increased generalized stiffness.  He has a lot of neck stiffness and discomfort.  He is going to physical therapy.  He also has been experiencing increased fatigue.  His bilateral hip joints are replaced which is causing some stiffness as well.  Denies any joint swelling or psoriasis flare.  He has been having discomfort in his left hip which is been replaced in the past.  Activities of Daily Living:  Patient reports morning stiffness for several hours.   Patient Reports nocturnal pain.  Difficulty dressing/grooming: Reports Difficulty climbing stairs: Reports Difficulty getting out of chair: Reports Difficulty using hands for taps, buttons, cutlery, and/or writing: Denies  Review of Systems  Constitutional: Positive for fatigue. Negative for night sweats.  HENT: Negative for mouth sores, mouth dryness and nose dryness.   Eyes: Negative for redness, itching and dryness.  Respiratory: Negative for shortness of breath, wheezing and difficulty breathing.   Cardiovascular: Negative for chest pain, palpitations, hypertension, irregular heartbeat and swelling in legs/feet.  Gastrointestinal: Negative for abdominal pain, constipation and diarrhea.  Endocrine: Negative for excessive thirst and increased urination.  Genitourinary: Negative for painful urination and pelvic pain.  Musculoskeletal: Positive for arthralgias, joint pain and morning stiffness. Negative for joint swelling, myalgias, muscle weakness, muscle tenderness and myalgias.    Skin: Negative for color change, rash, hair loss, nodules/bumps, skin tightness, ulcers and sensitivity to sunlight.  Allergic/Immunologic: Negative for susceptible to infections.  Neurological: Positive for weakness. Negative for dizziness, fainting, light-headedness, headaches, memory loss and night sweats.  Hematological: Negative for bruising/bleeding tendency and swollen glands.  Psychiatric/Behavioral: Negative for depressed mood, confusion and sleep disturbance. The patient is not nervous/anxious.     PMFS History:  Patient Active Problem List   Diagnosis Date Noted  . Essential tremor 04/03/2017  . Elevated blood pressure reading 02/03/2017  . Failed total hip arthroplasty ([place]) 11/23/2016  . High risk medications (not anticoagulants) long-term use 09/20/2016  . Bilateral hand pain 09/20/2016  . DDD (degenerative disc disease), cervical 09/20/2016  . History of total hip replacement, bilateral 09/20/2016  . DDD (degenerative disc disease), lumbar 09/20/2016  . Bilateral plantar fasciitis 09/20/2016  . Psoriasis 09/20/2016  . Primary osteoarthritis of both feet 09/20/2016  . Primary osteoarthritis of both hands 09/20/2016  . Primary osteoarthritis of both knees 09/20/2016  . Prediabetes 09/22/2014  . Hyperlipidemia 09/22/2014  . URI (upper respiratory infection) 07/07/2012  . Acute bronchitis 07/07/2012  . HYPERLIPIDEMIA 07/03/2008  . OBESITY 07/03/2008  . AVASCULAR NECROSIS, FEMORAL HEAD 07/03/2008  . Psoriatic arthritis ([place]) 05/02/2001    Past Medical History:  Diagnosis Date  . History of avascular necrosis of capital femoral epiphysis 2006, 2007   Bilateral Femoral Head  . HLD (hyperlipidemia)   . Psoriatic arthritis ([place])     Family History  Problem Relation Age of Onset  . Pneumonia Father   . Tremor Neg Hx    Past  Surgical History:  Procedure Laterality Date  . JOINT REPLACEMENT Bilateral 2006, 2007   Necrosis femoral head (bilateral)  . TOTAL HIP  REVISION Right 11/23/2016   Procedure: Acetabulum liner and femoral head revision;  Surgeon: [name], MD;  Location: [facility];  Service: Orthopedics;  Laterality: Right;   Social History   Social History Narrative   Lives at home w/ his wife   Right-handed   Caffeine: 2-3 cups per day   Immunization History  Administered Date(s) Administered  . Influenza,inj,Quad PF,6+ Mos 02/03/2017, 03/21/2018  . Pneumococcal Conjugate-13 03/21/2018  . Tdap 09/22/2014  . Zoster Recombinat (Shingrix) 04/02/2018, 05/23/2018     Objective: Vital Signs: BP (!) 159/92 (BP Location: Left Arm, Patient Position: Sitting, Cuff Size: Large)   Pulse 74   Resp 14   Ht 5\' 10"  (1.778 m)   Wt 235 lb (106.6 kg)   BMI 33.72 kg/m    Physical Exam Vitals signs and nursing note reviewed.  Constitutional:      Appearance: He is well-developed.  HENT:     Head: Normocephalic and atraumatic.  Eyes:     Conjunctiva/sclera: Conjunctivae normal.     Pupils: Pupils are equal, round, and reactive to light.  Neck:     Musculoskeletal: Normal range of motion and neck supple.  Cardiovascular:     Rate and Rhythm: Normal rate and regular rhythm.     Heart sounds: Normal heart sounds.  Pulmonary:     Effort: Pulmonary effort is normal.     Breath sounds: Normal breath sounds.  Abdominal:     General: Bowel sounds are normal.     Palpations: Abdomen is soft.  Skin:    General: Skin is warm and dry.     Capillary Refill: Capillary refill takes less than 2 seconds.  Neurological:     Mental Status: He is alert and oriented to person, place, and time.  Psychiatric:        Behavior: Behavior normal.      Musculoskeletal Exam: He has limited range of motion of his cervical spine.  He has some thoracic kyphosis.  Lumbar spine is limited range of motion.  No SI joint tenderness was noted.  Shoulder joints with good range of motion.  He has bilateral elbow joint contractures without synovitis.  He has limited  range of motion of bilateral wrist joints.  No synovitis was noted over wrist joints.  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  He had bilateral total hip replacement and limited range of motion of bilateral hip joints.  He has some discomfort range of motion of his left hip joint.  Knee joints that were no warmth swelling or effusion.  Ankle joints are in good range of motion.  CDAI Exam: CDAI Score: Not documented Patient Global Assessment: Not documented; Provider Global Assessment: Not documented Swollen: Not documented; Tender: Not documented Joint Exam   Not documented   There is currently no information documented on the homunculus. Go to the Rheumatology activity and complete the homunculus joint exam.  Investigation: No additional findings.  Imaging: No results found.  Recent Labs: Lab Results  Component Value Date   WBC 5.7 02/08/2018   HGB 16.0 02/08/2018   PLT 139 (L) 02/08/2018   NA 138 02/08/2018   K 4.3 02/08/2018   CL 104 02/08/2018   CO2 27 02/08/2018   GLUCOSE 85 02/08/2018   BUN 20 02/08/2018   CREATININE 0.98  02/08/2018   BILITOT 0.7 02/08/2018   ALKPHOS 98 12/27/2016   AST 24 02/08/2018   ALT 30 02/08/2018   PROT 7.1 02/08/2018   ALBUMIN 4.4 12/27/2016   CALCIUM 9.6 02/08/2018   GFRAA 101 02/08/2018   QFTBGOLDPLUS NEGATIVE 02/08/2018    Speciality Comments: No specialty comments available.  Procedures:  No procedures performed Allergies: Gold-containing drug products   Assessment / Plan:     Visit Diagnoses: Psoriatic arthritis (HCC)-patient has no synovitis on examination although he has multiple contractures and limited range of motion.  Psoriasis-he has no active psoriasis lesions.  High risk medication use - enbrel subcu weekly.  Patient states he has been getting yearly skin examination.- Plan: CBC with Differential/Platelet, COMPLETE METABOLIC PANEL WITH GFR today and then every 3  months to monitor for drug toxicity.  Primary osteoarthritis of both hands-he has limited range of motion in bilateral hands and incomplete fist formation.  History of total hip replacement, bilateral - 11/23/2016 Revision of right hip by Dr. Maureen Ralphs.  He is has increased discomfort in his left hip.  I have advised him to schedule appointment with orthopedic surgeon.  Primary osteoarthritis of both knees-he has ongoing discomfort in his knee joints but no warmth swelling or effusion was noted.  Primary osteoarthritis of both feet  DDD (degenerative disc disease), cervical-he has limited range of motion in his cervical spine.  He has been going to physical therapy.  DDD (degenerative disc disease), lumbar-the range of motion is improved.  Other fatigue -he complains of increased fatigue.  Plan: CK, TSH, Sedimentation rate, VITAMIN D 25 Hydroxy (Vit-D Deficiency, Fractures)  History of vitamin D deficiency  History of hypertension  History of hyperlipidemia  History of prediabetes   Orders: Orders Placed This Encounter  Procedures  . CBC with Differential/Platelet  . COMPLETE METABOLIC PANEL WITH GFR  . CK  . TSH  . Sedimentation rate  . VITAMIN D 25 Hydroxy (Vit-D Deficiency, Fractures)   No orders of the defined types were placed in this encounter.     Follow-Up Instructions: Return in about 5 months (around 11/20/2018) for Psoriatic arthritis.   Bo Merino, MD  Note - This record has been created using Editor, commissioning.  Chart creation errors have been sought, but may not always  have been located. Such creation errors do not reflect on  the standard of medical care.

## 2018-06-22 NOTE — Patient Instructions (Signed)
Standing Labs We placed an order today for your standing lab work.    Please come back and get your standing labs in May and every 3 months  We have open lab Monday through Friday from 8:30-11:30 AM and 1:30-4:00 PM  at the office of Dr. Ander Wamser.   You may experience shorter wait times on Monday and Friday afternoons. The office is located at [address], [address], [place], [place] [address] No appointment is necessary.   Labs are drawn by [facility].  You may receive a bill from [facility] for your lab work.  If you wish to have your labs drawn at another location, please call the office 24 hours in advance to send orders.  If you have any questions regarding directions or hours of operation,  please call [phone].   Just as a reminder please drink plenty of water prior to coming for your lab work. Thanks!  

## 2018-06-23 LAB — CBC WITH DIFFERENTIAL/PLATELET
Absolute Monocytes: 221 cells/uL (ref 200–950)
Basophils Absolute: 20 cells/uL (ref 0–200)
Basophils Relative: 0.4 %
Eosinophils Absolute: 29 cells/uL (ref 15–500)
Eosinophils Relative: 0.6 %
HCT: 49.2 % (ref 38.5–50.0)
Hemoglobin: 17.3 g/dL — ABNORMAL HIGH (ref 13.2–17.1)
Lymphs Abs: 1730 cells/uL (ref 850–3900)
MCH: 32.3 pg (ref 27.0–33.0)
MCHC: 35.2 g/dL (ref 32.0–36.0)
MCV: 92 fL (ref 80.0–100.0)
MPV: 11.6 fL (ref 7.5–12.5)
Monocytes Relative: 4.5 %
NEUTROS PCT: 59.2 %
Neutro Abs: 2901 cells/uL (ref 1500–7800)
Platelets: 134 10*3/uL — ABNORMAL LOW (ref 140–400)
RBC: 5.35 10*6/uL (ref 4.20–5.80)
RDW: 12.5 % (ref 11.0–15.0)
Total Lymphocyte: 35.3 %
WBC: 4.9 10*3/uL (ref 3.8–10.8)

## 2018-06-23 LAB — COMPLETE METABOLIC PANEL WITH GFR
AG RATIO: 1.6 (calc) (ref 1.0–2.5)
ALT: 41 U/L (ref 9–46)
AST: 25 U/L (ref 10–35)
Albumin: 4.6 g/dL (ref 3.6–5.1)
Alkaline phosphatase (APISO): 71 U/L (ref 35–144)
BUN: 19 mg/dL (ref 7–25)
CO2: 27 mmol/L (ref 20–32)
CREATININE: 1.04 mg/dL (ref 0.70–1.33)
Calcium: 9.7 mg/dL (ref 8.6–10.3)
Chloride: 103 mmol/L (ref 98–110)
GFR, Est African American: 94 mL/min/{1.73_m2} (ref >=60)
GFR, Est Non African American: 81 mL/min/{1.73_m2} (ref >=60)
Globulin: 2.8 g/dL (calc) (ref 1.9–3.7)
Glucose, Bld: 85 mg/dL (ref 65–99)
Potassium: 4.5 mmol/L (ref 3.5–5.3)
SODIUM: 139 mmol/L (ref 135–146)
Total Bilirubin: 0.9 mg/dL (ref 0.2–1.2)
Total Protein: 7.4 g/dL (ref 6.1–8.1)

## 2018-06-23 LAB — CK: Total CK: 106 U/L (ref 44–196)

## 2018-06-23 LAB — SEDIMENTATION RATE: Sed Rate: 2 mm/h (ref 0–20)

## 2018-06-23 LAB — VITAMIN D 25 HYDROXY (VIT D DEFICIENCY, FRACTURES): Vit D, 25-Hydroxy: 46 ng/mL (ref 30–100)

## 2018-06-23 LAB — TSH: TSH: 1.12 mIU/L (ref 0.40–4.50)

## 2018-06-25 ENCOUNTER — Other Ambulatory Visit: Payer: Self-pay | Admitting: Rheumatology

## 2018-06-25 ENCOUNTER — Other Ambulatory Visit: Payer: Self-pay | Admitting: Internal Medicine

## 2018-06-25 NOTE — Progress Notes (Signed)
stable °

## 2018-06-25 NOTE — Telephone Encounter (Signed)
Last Visit: 06/22/18 Next visit: 11/21/18 Labs: 06/22/18 Hgb 17.3 Platelets 134 Tb Gold: 02/08/18 Neg   Okay to refill per Dr. Estanislado Pandy

## 2018-06-26 ENCOUNTER — Ambulatory Visit: Payer: 59 | Admitting: Physical Therapy

## 2018-06-26 ENCOUNTER — Encounter: Payer: Self-pay | Admitting: Physical Therapy

## 2018-06-26 DIAGNOSIS — R2689 Other abnormalities of gait and mobility: Secondary | ICD-10-CM

## 2018-06-26 DIAGNOSIS — M542 Cervicalgia: Secondary | ICD-10-CM | POA: Diagnosis not present

## 2018-06-26 DIAGNOSIS — R29898 Other symptoms and signs involving the musculoskeletal system: Secondary | ICD-10-CM | POA: Diagnosis not present

## 2018-06-26 NOTE — Therapy (Signed)
[facility] [address] [place], [place], [address] Phone: [phone]   Fax:  [phone]  Physical Therapy Treatment  Patient Details  Name: [name] MRN: [id number] Date of Birth: 12-10-1965 Referring Provider (PT): Eulas Post, MD   Encounter Date: 06/26/2018  PT End of Session - 06/26/18 0859    Visit Number  9    Date for PT Re-Evaluation  07/25/18    Authorization Type  [facility]    PT Start Time  0842    PT Stop Time  0925    PT Time Calculation (min)  43 min    Activity Tolerance  Patient tolerated treatment well;No increased pain    Behavior During Therapy  WFL for tasks assessed/performed       Past Medical History:  Diagnosis Date  . History of avascular necrosis of capital femoral epiphysis 2006, 2007   Bilateral Femoral Head  . HLD (hyperlipidemia)   . Psoriatic arthritis [facility])     Past Surgical History:  Procedure Laterality Date  . JOINT REPLACEMENT Bilateral 2006, 2007   Necrosis femoral head (bilateral)  . TOTAL HIP REVISION Right 11/23/2016   Procedure: Acetabulum liner and femoral head revision;  Surgeon: [name], MD;  Location: [facility];  Service: Orthopedics;  Laterality: Right;    There were no vitals filed for this visit.  Subjective Assessment - 06/26/18 0845    Subjective  Pt states thatt he had some issues with his Lt hip over the weekend. He thinks this is getting better now. He saw his rheumatologist hoping they would test his blood to see if the metal was in it. His neck has been good, and he is doing his HEP 2x a day. This helps his neck feel much more flexible.     Currently in Pain?  No/denies                       [facility] Adult PT Treatment/Exercise - 06/26/18 0001      Neck Exercises: Machines for Strengthening   UBE (Upper Arm Bike)  L2 x2 min forward/backward PT present to discuss progress       Neck Exercises: Seated   Neck Retraction  5  secs;15 reps    Neck Retraction Limitations  with pt overpressure     Cervical Rotation  Both;10 reps    Cervical Rotation Limitations  neck retraction with ball against wall, 5 reps with pt overpressure    Other Seated Exercise  attempted cervical extension stretch with towel x3 reps     Other Seated Exercise  thoracic rotation with UE pull on arm rest x5 reps, thoracic rotation with UE reach for opposite foot x10 reps      Neck Exercises: Supine   Other Supine Exercise  cervical extension over pool noodle 2x10 reps       Manual Therapy   Joint Mobilization  thoracic rotation mobilization with movement x10 reps       Neck Exercises: Stretches   Upper Trapezius Stretch  2 reps;Left;Right;20 seconds             PT Education - 06/26/18 0918    Education Details  technique with therex; updated HEP    Person(s) Educated  Patient    Methods  Explanation;Handout;Tactile cues    Comprehension  Verbalized understanding;Returned demonstration       PT Short Term Goals - 06/21/18 1003      PT SHORT  TERM GOAL #1   Title  Ind and compliant with initial HEP for frequent cervical ROM throughout day and postural adjustments during reading/computer time.    Time  3    Period  Weeks    Status  Partially Met      PT SHORT TERM GOAL #2   Title  Pt will achieve at least 45 degrees bil neck rotation to improve functional activities such as turning head while driving.    Baseline  50 deg Rt    Time  4    Period  Weeks    Status  Partially Met      PT SHORT TERM GOAL #3   Title  Pt will be able to demonstrate improved seated cervical posture and strategy for elevating reading materials to reduce strain on neck.    Time  3    Period  Weeks    Status  New        PT Long Term Goals - 05/30/18 1921      PT LONG TERM GOAL #1   Title  Pt will be ind in advanced HEP.    Time  8    Period  Weeks    Status  New    Target Date  07/25/18      PT LONG TERM GOAL #2   Title  Pt will  reduce FOTO to 35% demonstrating less limitation with functional activities.    Time  8    Period  Weeks    Status  New    Target Date  07/25/18      PT LONG TERM GOAL #3   Title  Pt will report plans to attend neighborhood gym or aquatic PT to maintain strength and ROM gains to allow greater ease of movement.    Time  8    Period  Weeks    Status  New    Target Date  07/25/18      PT LONG TERM GOAL #4   Title  Pt will report at least 30% greater ease of head/neck movements throughout daily activities.    Time  8    Period  Weeks    Status  New    Target Date  07/25/18            Plan - 06/26/18 0354    Clinical Impression Statement  Pt continues to report HEP adherence and improvements in cervical stiffness after HEP completion. Session focused on making several updates to his HEP to further promote cervical and thoracic flexibility, and pt had good understanding with this after initial cuing from the therapist. Pt was able to activate deep cervical flexors but has difficulty with active cervical extension due to limited segmental mobility. Will continue with current POC moving forward.     Rehab Potential  Good    PT Frequency  2x / week    PT Duration  8 weeks    PT Treatment/Interventions  ADLs/Self Care Home Management;Aquatic Therapy;Electrical Stimulation;Moist Heat;Traction;Functional mobility training;Therapeutic exercise;Neuromuscular re-education;Patient/family education;Manual techniques;Passive range of motion;Dry needling;Taping;Energy conservation;Spinal Manipulations;Joint Manipulations    PT Next Visit Plan  possible d/n to multifidi if needed; seated thoracic rotation/strength; cervical extension and rotation MWM; cervical retraction    PT Home Exercise Plan  [email or web address]     Consulted and Agree with Plan of Care  Patient       Patient will benefit from skilled therapeutic intervention in order to improve the following deficits and impairments:  Improper body  mechanics, Decreased mobility, Postural dysfunction, Decreased activity tolerance, Decreased endurance, Decreased range of motion, Hypomobility, Impaired UE functional use  Visit Diagnosis: Cervicalgia  Other abnormalities of gait and mobility  Other symptoms and signs involving the musculoskeletal system     Problem List Patient Active Problem List   Diagnosis Date Noted  . Essential tremor 04/03/2017  . Elevated blood pressure reading 02/03/2017  . Failed total hip arthroplasty ([place]) 11/23/2016  . High risk medications (not anticoagulants) long-term use 09/20/2016  . Bilateral hand pain 09/20/2016  . DDD (degenerative disc disease), cervical 09/20/2016  . History of total hip replacement, bilateral 09/20/2016  . DDD (degenerative disc disease), lumbar 09/20/2016  . Bilateral plantar fasciitis 09/20/2016  . Psoriasis 09/20/2016  . Primary osteoarthritis of both feet 09/20/2016  . Primary osteoarthritis of both hands 09/20/2016  . Primary osteoarthritis of both knees 09/20/2016  . Prediabetes 09/22/2014  . Hyperlipidemia 09/22/2014  . URI (upper respiratory infection) 07/07/2012  . Acute bronchitis 07/07/2012  . HYPERLIPIDEMIA 07/03/2008  . OBESITY 07/03/2008  . AVASCULAR NECROSIS, FEMORAL HEAD 07/03/2008  . Psoriatic arthritis ([place]) 05/02/2001   9:38 AM,06/26/18 Sherol Dade PT, DPT [facility] [address], [place], [place], [address] Phone: [phone]   Fax:  [phone]  Name: [name] MRN: [id number] Date of Birth: 12-04-1965

## 2018-06-26 NOTE — Patient Instructions (Signed)
Access Code: [email or web address]  URL: [email or web address]  Date: 06/26/2018  Prepared by: Sherol Dade   Exercises  Standing Row with Anchored Resistance - 15 reps - 2 sets - 1x daily - 7x weekly  Seated Isometric Cervical Sidebending - 5 reps - 10 hold - 2x daily - 7x weekly  Seated Cervical Retraction and Rotation - 10 reps - 1-2 sets - 1x daily - 7x weekly  Seated Assisted Cervical Rotation with Towel - 10 reps - 3 sets - 2x daily - 7x weekly  Cervical Retraction with Overpressure - 10 reps - 1 sets - 5 hold - 2x daily - 7x weekly  Seated Trunk Rotation Stretch - 10 reps - 1 sets - 2x daily - 7x weekly  Seated Single Arm Reach Down with Trunk Rotation - 10 reps - 1 sets - 2x daily - 7x weekly  Supine Shoulder Horizontal Abduction with Resistance - 10 reps - 3 sets - 1x daily - 7x weekly    [facility] [address], [place], [place] [address] Phone # [phone] Fax [phone]

## 2018-06-28 ENCOUNTER — Ambulatory Visit: Payer: 59 | Admitting: Physical Therapy

## 2018-06-28 ENCOUNTER — Encounter: Payer: Self-pay | Admitting: Physical Therapy

## 2018-06-28 DIAGNOSIS — R29898 Other symptoms and signs involving the musculoskeletal system: Secondary | ICD-10-CM | POA: Diagnosis not present

## 2018-06-28 DIAGNOSIS — M542 Cervicalgia: Secondary | ICD-10-CM

## 2018-06-28 DIAGNOSIS — R2689 Other abnormalities of gait and mobility: Secondary | ICD-10-CM | POA: Diagnosis not present

## 2018-06-28 NOTE — Patient Instructions (Signed)
Access Code: [email or web address]  URL: [email or web address]  Date: 06/28/2018  Prepared by: Sherol Dade   Exercises  Standing Row with Anchored Resistance - 15 reps - 2 sets - 1x daily - 7x weekly  Seated Cervical Retraction and Rotation - 10 reps - 1-2 sets - 1x daily - 7x weekly  Seated Assisted Cervical Rotation with Towel - 10 reps - 3 sets - 2x daily - 7x weekly  Cervical Retraction with Overpressure - 10 reps - 1 sets - 5 hold - 2x daily - 7x weekly  Seated Trunk Rotation Stretch - 10 reps - 1 sets - 2x daily - 7x weekly  Seated Single Arm Reach Down with Trunk Rotation - 10 reps - 1 sets - 2x daily - 7x weekly  Supine Shoulder Horizontal Abduction with Resistance - 10 reps - 3 sets - 1x daily - 7x weekly  Seated Cervical Sidebending Stretch - 2 sets - 20 hold - 2x daily - 7x weekly    [facility] [address] [place], [place] [address] Phone # [phone] Fax [phone]

## 2018-06-28 NOTE — Therapy (Signed)
[facility] [address] [place], [place], [address] Phone: [phone]   Fax:  [phone]  Physical Therapy Treatment/progress note  Patient Details  Name: [name] MRN: [id number] Date of Birth: 1965-07-27 Referring Provider (PT): Eulas Post, MD  Progress Note Reporting Period 05/30/18 to 06/28/18  See note below for Objective Data and Assessment of Progress/Goals.       Encounter Date: 06/28/2018  PT End of Session - 06/28/18 0900    Visit Number  10    Date for PT Re-Evaluation  07/25/18    Authorization Type  UMR    PT Start Time  0845    PT Stop Time  0925    PT Time Calculation (min)  40 min    Activity Tolerance  Patient tolerated treatment well;No increased pain    Behavior During Therapy  WFL for tasks assessed/performed       Past Medical History:  Diagnosis Date  . History of avascular necrosis of capital femoral epiphysis 2006, 2007   Bilateral Femoral Head  . HLD (hyperlipidemia)   . Psoriatic arthritis [facility])     Past Surgical History:  Procedure Laterality Date  . JOINT REPLACEMENT Bilateral 2006, 2007   Necrosis femoral head (bilateral)  . TOTAL HIP REVISION Right 11/23/2016   Procedure: Acetabulum liner and femoral head revision;  Surgeon: [name], MD;  Location: [facility];  Service: Orthopedics;  Laterality: Right;    There were no vitals filed for this visit.  Subjective Assessment - 06/28/18 0847    Subjective  Pt states that his HEP is going well. He was able to get a ball and foam roll for home. He felt "loose" after he left last session. He feels that he makes quite a bit of improvement following his sessions.     How long can you sit comfortably?  unlimited for neck    Currently in Pain?  No/denies         [facility] PT Assessment - 06/28/18 0001      AROM   Cervical Flexion  40    Cervical Extension  35    Cervical - Right Side Bend  15    Cervical - Left  Side Bend  20    Cervical - Right Rotation  55    Cervical - Left Rotation  45                   [facility] Adult PT Treatment/Exercise - 06/28/18 0001      Neck Exercises: Standing   Neck Retraction  5 reps;10 secs    Neck Retraction Limitations  ball against wall       Neck Exercises: Seated   Neck Retraction  15 reps;3 secs    Neck Retraction Limitations  last 10 reps with yellow TB     Cervical Rotation  Both;10 reps    Other Seated Exercise  thoracic extension over foam roll 3x10 reps at various levels       Manual Therapy   Joint Mobilization  Lt and Rt low cervical lateral flexion mobilization with movement 2x10 each             PT Education - 06/28/18 3559    Education Details  updated HEP; progress towards goals.    Person(s) Educated  Patient    Methods  Explanation;Handout    Comprehension  Returned demonstration;Verbalized understanding       PT Short Term Goals - 06/28/18  0928      PT SHORT TERM GOAL #1   Title  Ind and compliant with initial HEP for frequent cervical ROM throughout day and postural adjustments during reading/computer time.    Time  3    Period  Weeks    Status  Achieved      PT SHORT TERM GOAL #2   Title  Pt will achieve at least 45 degrees bil neck rotation to improve functional activities such as turning head while driving.    Baseline  Rt 55 deg, Lt 45 deg    Time  4    Period  Weeks    Status  Achieved      PT SHORT TERM GOAL #3   Title  Pt will be able to demonstrate improved seated cervical posture and strategy for elevating reading materials to reduce strain on neck.    Baseline  using lumbar roll     Time  3    Period  Weeks    Status  Achieved        PT Long Term Goals - 06/28/18 0932      PT LONG TERM GOAL #1   Title  Pt will be ind in advanced HEP.    Time  8    Period  Weeks    Status  On-going      PT LONG TERM GOAL #2   Title  Pt will reduce FOTO to 35% demonstrating less limitation with  functional activities.    Baseline  41%    Time  8    Period  Weeks    Status  On-going      PT LONG TERM GOAL #3   Title  Pt will report plans to attend neighborhood gym or aquatic PT to maintain strength and ROM gains to allow greater ease of movement.    Time  8    Period  Weeks    Status  On-going      PT LONG TERM GOAL #4   Title  Pt will report at least 30% greater ease of head/neck movements throughout daily activities.    Baseline  pt unsure     Time  8    Period  Weeks    Status  Partially Met            Plan - 06/28/18 6712    Clinical Impression Statement  Pt is making steady progress having met all of his short term goals since beginning PT. He has atleast 45 deg of active rotation to the Lt and 55 deg of rotation to the Rt. He is adherent to his HEP and requires intermittent therapist review of this for proper technique. P's primary limitations are into lateral flexion, which is minimally improved overall. He would continue to benefit from skilled PT to address remaining deficits in lateral cervical flexion and posture awareness moving forward.     Rehab Potential  Good    PT Frequency  2x / week    PT Duration  8 weeks    PT Treatment/Interventions  ADLs/Self Care Home Management;Aquatic Therapy;Electrical Stimulation;Moist Heat;Traction;Functional mobility training;Therapeutic exercise;Neuromuscular re-education;Patient/family education;Manual techniques;Passive range of motion;Dry needling;Taping;Energy conservation;Spinal Manipulations;Joint Manipulations    PT Next Visit Plan  lateral flexion ROM focus; seated thoracic rotation/strength; cervical extension and rotation MWM; cervical retraction    PT Home Exercise Plan  [id number]     Consulted and Agree with Plan of Care  Patient       Patient will benefit  from skilled therapeutic intervention in order to improve the following deficits and impairments:  Improper body mechanics, Decreased mobility, Postural  dysfunction, Decreased activity tolerance, Decreased endurance, Decreased range of motion, Hypomobility, Impaired UE functional use  Visit Diagnosis: Cervicalgia  Other abnormalities of gait and mobility  Other symptoms and signs involving the musculoskeletal system     Problem List Patient Active Problem List   Diagnosis Date Noted  . Essential tremor 04/03/2017  . Elevated blood pressure reading 02/03/2017  . Failed total hip arthroplasty ([place]) 11/23/2016  . High risk medications (not anticoagulants) long-term use 09/20/2016  . Bilateral hand pain 09/20/2016  . DDD (degenerative disc disease), cervical 09/20/2016  . History of total hip replacement, bilateral 09/20/2016  . DDD (degenerative disc disease), lumbar 09/20/2016  . Bilateral plantar fasciitis 09/20/2016  . Psoriasis 09/20/2016  . Primary osteoarthritis of both feet 09/20/2016  . Primary osteoarthritis of both hands 09/20/2016  . Primary osteoarthritis of both knees 09/20/2016  . Prediabetes 09/22/2014  . Hyperlipidemia 09/22/2014  . URI (upper respiratory infection) 07/07/2012  . Acute bronchitis 07/07/2012  . HYPERLIPIDEMIA 07/03/2008  . OBESITY 07/03/2008  . AVASCULAR NECROSIS, FEMORAL HEAD 07/03/2008  . Psoriatic arthritis ([place]) 05/02/2001    9:36 AM,06/28/18 Sherol Dade PT, DPT [facility] [address] [place], [place], [address] Phone: [phone]   Fax:  [phone]  Name: FREDRIK MOGEL MRN: [id number] Date of Birth: 11-03-1965

## 2018-07-02 DIAGNOSIS — Z96641 Presence of right artificial hip joint: Secondary | ICD-10-CM | POA: Diagnosis not present

## 2018-07-03 ENCOUNTER — Encounter: Payer: Self-pay | Admitting: Physical Therapy

## 2018-07-03 ENCOUNTER — Ambulatory Visit: Payer: 59 | Attending: Family Medicine | Admitting: Physical Therapy

## 2018-07-03 DIAGNOSIS — R2689 Other abnormalities of gait and mobility: Secondary | ICD-10-CM | POA: Insufficient documentation

## 2018-07-03 DIAGNOSIS — R29898 Other symptoms and signs involving the musculoskeletal system: Secondary | ICD-10-CM | POA: Diagnosis not present

## 2018-07-03 DIAGNOSIS — M542 Cervicalgia: Secondary | ICD-10-CM | POA: Insufficient documentation

## 2018-07-03 NOTE — Therapy (Signed)
[facility] [address], [place], [place], [address] Phone: [phone]   Fax:  [phone]  Physical Therapy Treatment  Patient Details  Name: [name] MRN: [id number] Date of Birth: [birth year] Referring Provider (PT): Eulas Post, MD   Encounter Date: 07/03/2018  PT End of Session - 07/03/18 0902    Visit Number  11    Date for PT Re-Evaluation  07/25/18    Authorization Type  UMR    PT Start Time  0844    PT Stop Time  0925    PT Time Calculation (min)  41 min    Activity Tolerance  Patient tolerated treatment well;No increased pain    Behavior During Therapy  WFL for tasks assessed/performed       Past Medical History:  Diagnosis Date  . History of avascular necrosis of capital femoral epiphysis 2006, 2007   Bilateral Femoral Head  . HLD (hyperlipidemia)   . Psoriatic arthritis [facility])     Past Surgical History:  Procedure Laterality Date  . JOINT REPLACEMENT Bilateral 2006, 2007   Necrosis femoral head (bilateral)  . TOTAL HIP REVISION Right 11/23/2016   Procedure: Acetabulum liner and femoral head revision;  Surgeon: [name], MD;  Location: [facility];  Service: Orthopedics;  Laterality: Right;    There were no vitals filed for this visit.  Subjective Assessment - 07/03/18 0847    Subjective  Pt reports that things are going well. He is still working on his HEP. He tried to get his wife to get his shoulder to move more.     How long can you sit comfortably?  unlimited for neck    Currently in Pain?  No/denies         [facility] PT Assessment - 07/03/18 0001      AROM   Cervical - Right Side Bend  20    Cervical - Left Side Bend  20                   [facility] Adult PT Treatment/Exercise - 07/03/18 0001      Exercises   Other Exercises   supine thoracic extension over foam roll 2x10 reps       Neck Exercises: Machines for Strengthening   UBE (Upper Arm Bike)  L2 x2 min  forward/backward PT present to discuss HEP       Manual Therapy   Manual therapy comments  passive lateral flexion x5 reps each direction    Joint Mobilization  Lt and Rt lateral mobilization in supine x2 bouts C4 to C7; lateral cervical mobilization mid to lower cervical spine with movement 2x10 reps each direction      Neck Exercises: Stretches   Upper Trapezius Stretch  2 reps;Left;Right;20 seconds             PT Education - 07/03/18 0904    Education Details  technique with therex    Person(s) Educated  Patient    Methods  Explanation    Comprehension  Verbalized understanding;Returned demonstration       PT Short Term Goals - 06/28/18 0928      PT SHORT TERM GOAL #1   Title  Ind and compliant with initial HEP for frequent cervical ROM throughout day and postural adjustments during reading/computer time.    Time  3    Period  Weeks    Status  Achieved      PT SHORT TERM GOAL #2  Title  Pt will achieve at least 45 degrees bil neck rotation to improve functional activities such as turning head while driving.    Baseline  Rt 55 deg, Lt 45 deg    Time  4    Period  Weeks    Status  Achieved      PT SHORT TERM GOAL #3   Title  Pt will be able to demonstrate improved seated cervical posture and strategy for elevating reading materials to reduce strain on neck.    Baseline  using lumbar roll     Time  3    Period  Weeks    Status  Achieved        PT Long Term Goals - 06/28/18 1856      PT LONG TERM GOAL #1   Title  Pt will be ind in advanced HEP.    Time  8    Period  Weeks    Status  On-going      PT LONG TERM GOAL #2   Title  Pt will reduce FOTO to 35% demonstrating less limitation with functional activities.    Baseline  41%    Time  8    Period  Weeks    Status  On-going      PT LONG TERM GOAL #3   Title  Pt will report plans to attend neighborhood gym or aquatic PT to maintain strength and ROM gains to allow greater ease of movement.    Time  8     Period  Weeks    Status  On-going      PT LONG TERM GOAL #4   Title  Pt will report at least 30% greater ease of head/neck movements throughout daily activities.    Baseline  pt unsure     Time  8    Period  Weeks    Status  Partially Met            Plan - 07/03/18 0929    Clinical Impression Statement  Pt continues to complete his HEP consistently at home and was able to get a good stretch intermittently with his lateral flexion ROM. Pt remains stiff in all directions, with a focus on improving the lateral flexion component of rotation with a combination of manual treatment and therex. Although not significant, pt had a 5 deg improvement in lateral flexion end of today's session. He was encouraged to increase frequency of his ROM HEP moving forward and he had good understanding of this. Will continue with current POC.      Rehab Potential  Good    PT Frequency  2x / week    PT Duration  8 weeks    PT Treatment/Interventions  ADLs/Self Care Home Management;Aquatic Therapy;Electrical Stimulation;Moist Heat;Traction;Functional mobility training;Therapeutic exercise;Neuromuscular re-education;Patient/family education;Manual techniques;Passive range of motion;Dry needling;Taping;Energy conservation;Spinal Manipulations;Joint Manipulations    PT Next Visit Plan  lateral flexion ROM focus with mobilization to cervical spine and stretches; seated thoracic rotation/strength; cervical extension and rotation MWM; cervical retraction    PT Home Exercise Plan  [email or web address]     Consulted and Agree with Plan of Care  Patient       Patient will benefit from skilled therapeutic intervention in order to improve the following deficits and impairments:  Improper body mechanics, Decreased mobility, Postural dysfunction, Decreased activity tolerance, Decreased endurance, Decreased range of motion, Hypomobility, Impaired UE functional use  Visit Diagnosis: Cervicalgia  Other abnormalities of gait and  mobility  Other symptoms and  signs involving the musculoskeletal system     Problem List Patient Active Problem List   Diagnosis Date Noted  . Essential tremor 04/03/2017  . Elevated blood pressure reading 02/03/2017  . Failed total hip arthroplasty ([place]) 11/23/2016  . High risk medications (not anticoagulants) long-term use 09/20/2016  . Bilateral hand pain 09/20/2016  . DDD (degenerative disc disease), cervical 09/20/2016  . History of total hip replacement, bilateral 09/20/2016  . DDD (degenerative disc disease), lumbar 09/20/2016  . Bilateral plantar fasciitis 09/20/2016  . Psoriasis 09/20/2016  . Primary osteoarthritis of both feet 09/20/2016  . Primary osteoarthritis of both hands 09/20/2016  . Primary osteoarthritis of both knees 09/20/2016  . Prediabetes 09/22/2014  . Hyperlipidemia 09/22/2014  . URI (upper respiratory infection) 07/07/2012  . Acute bronchitis 07/07/2012  . HYPERLIPIDEMIA 07/03/2008  . OBESITY 07/03/2008  . AVASCULAR NECROSIS, FEMORAL HEAD 07/03/2008  . Psoriatic arthritis ([place]) 05/02/2001    10:29 AM,07/03/18 Sherol Dade PT, DPT [facility] [address] [place], [place], [address] Phone: [phone]   Fax:  [phone]  Name: OLAJUWON FOSDICK MRN: [id number] Date of Birth: [birth year]

## 2018-07-04 MED FILL — ENBREL 50 MG/ML SOSY: 50 | 28 days supply | Qty: 4 | Fill #0

## 2018-07-05 ENCOUNTER — Ambulatory Visit: Payer: 59 | Admitting: Physical Therapy

## 2018-07-05 ENCOUNTER — Encounter: Payer: Self-pay | Admitting: Physical Therapy

## 2018-07-05 DIAGNOSIS — M542 Cervicalgia: Secondary | ICD-10-CM

## 2018-07-05 DIAGNOSIS — R2689 Other abnormalities of gait and mobility: Secondary | ICD-10-CM | POA: Diagnosis not present

## 2018-07-05 DIAGNOSIS — R29898 Other symptoms and signs involving the musculoskeletal system: Secondary | ICD-10-CM | POA: Diagnosis not present

## 2018-07-05 NOTE — Therapy (Signed)
[facility] [address], [place], [place], [address] Phone: [phone]   Fax:  [phone]  Physical Therapy Treatment  Patient Details  Name: [name] MRN: [id number] Date of Birth: December 12, 1965 Referring Provider (PT): Eulas Post, MD   Encounter Date: 07/05/2018  PT End of Session - 07/05/18 0838    Visit Number  12    Date for PT Re-Evaluation  07/25/18    Authorization Type  UMR    PT Start Time  0842    PT Stop Time  0928    PT Time Calculation (min)  46 min    Activity Tolerance  Patient tolerated treatment well;No increased pain    Behavior During Therapy  WFL for tasks assessed/performed       Past Medical History:  Diagnosis Date  . History of avascular necrosis of capital femoral epiphysis 2006, 2007   Bilateral Femoral Head  . HLD (hyperlipidemia)   . Psoriatic arthritis [facility])     Past Surgical History:  Procedure Laterality Date  . JOINT REPLACEMENT Bilateral 2006, 2007   Necrosis femoral head (bilateral)  . TOTAL HIP REVISION Right 11/23/2016   Procedure: Acetabulum liner and femoral head revision;  Surgeon: [name], MD;  Location: [facility];  Service: Orthopedics;  Laterality: Right;    There were no vitals filed for this visit.  Subjective Assessment - 07/05/18 0839    Subjective  Pt states he is working hard.  "It's a battle" working against my stiffness.    Pertinent History  long time history of psoriatic arthritis, Bil THR, OA hands bil    Limitations  Sitting;Reading;Lifting    How long can you sit comfortably?  unlimited for neck    Patient Stated Goals  improve ROM, learn how to manage available range and maintain    Currently in Pain?  No/denies                       [facility] Adult PT Treatment/Exercise - 07/05/18 0001      Exercises   Exercises  Neck;Shoulder      Neck Exercises: Machines for Strengthening   UBE (Upper Arm Bike)  L2 x2 min  forward/backward PT present to discuss HEP    L2     Neck Exercises: Seated   Other Seated Exercise  capital flexion with neck retraciton 1x20 sec      Neck Exercises: Supine   Neck Retraction  5 reps;5 secs    Neck Retraction Limitations  PT provided manual resistance from underneath lower cspine      Neck Exercises: Sidelying   Other Sidelying Exercise  open books bil x 15 each      Manual Therapy   Manual Therapy  Joint mobilization;Muscle Energy Technique;Soft tissue mobilization    Joint Mobilization  O/A nod Gr II-IV, Rt A/A  Gr II/III for Rt SB/Lt Rot, bil U-joint sideglides Gr II-IV,, mob with movement sidebening bil and flexion/rotation bil    Soft tissue mobilization  scalenes, cerv paraspinals    Muscle Energy Technique  sidebending bil contract/relax               PT Short Term Goals - 06/28/18 7096      PT SHORT TERM GOAL #1   Title  Ind and compliant with initial HEP for frequent cervical ROM throughout day and postural adjustments during reading/computer time.    Time  3    Period  Weeks  Status  Achieved      PT SHORT TERM GOAL #2   Title  Pt will achieve at least 45 degrees bil neck rotation to improve functional activities such as turning head while driving.    Baseline  Rt 55 deg, Lt 45 deg    Time  4    Period  Weeks    Status  Achieved      PT SHORT TERM GOAL #3   Title  Pt will be able to demonstrate improved seated cervical posture and strategy for elevating reading materials to reduce strain on neck.    Baseline  using lumbar roll     Time  3    Period  Weeks    Status  Achieved        PT Long Term Goals - 06/28/18 7564      PT LONG TERM GOAL #1   Title  Pt will be ind in advanced HEP.    Time  8    Period  Weeks    Status  On-going      PT LONG TERM GOAL #2   Title  Pt will reduce FOTO to 35% demonstrating less limitation with functional activities.    Baseline  41%    Time  8    Period  Weeks    Status  On-going      PT  LONG TERM GOAL #3   Title  Pt will report plans to attend neighborhood gym or aquatic PT to maintain strength and ROM gains to allow greater ease of movement.    Time  8    Period  Weeks    Status  On-going      PT LONG TERM GOAL #4   Title  Pt will report at least 30% greater ease of head/neck movements throughout daily activities.    Baseline  pt unsure     Time  8    Period  Weeks    Status  Partially Met            Plan - 07/05/18 3329    Clinical Impression Statement  Pt with improved release of upper cervical joints and U-joints with manual therapy today.  Pt continues to have gross limitations especially in Rt upper cervical joints.  He reports short-lived relief between visits.  PT provided him with discount coupon for purchase of pro-roller SOFT for thoracic ROM/stretching.  We also discussed option of aquatic PT which Pt is very interested in adding to his POC.  This would be a great environment for Pt given PMH and stiffness.  Pt will continue to benefit from skilled PT for manual therapy, ROM, stretching and stabilization along POC.    PT Frequency  2x / week    PT Duration  8 weeks    PT Treatment/Interventions  ADLs/Self Care Home Management;Aquatic Therapy;Electrical Stimulation;Moist Heat;Traction;Functional mobility training;Therapeutic exercise;Neuromuscular re-education;Patient/family education;Manual techniques;Passive range of motion;Dry needling;Taping;Energy conservation;Spinal Manipulations;Joint Manipulations    PT Next Visit Plan  consider new cert to include another visit/week to include aquatic PT, Pt very interested, lateral flexion ROM focus with mobilization to cervical spine and stretches; seated thoracic rotation/strength; cervical extension and rotation MWM; cervical retraction    PT Home Exercise Plan  [id number]     Recommended Other Services  aquatic PT referral - add to plan of care to MD    Consulted and Agree with Plan of Care  Patient        Patient will benefit from skilled  therapeutic intervention in order to improve the following deficits and impairments:  Improper body mechanics, Decreased mobility, Postural dysfunction, Decreased activity tolerance, Decreased endurance, Decreased range of motion, Hypomobility, Impaired UE functional use  Visit Diagnosis: Cervicalgia  Other abnormalities of gait and mobility  Other symptoms and signs involving the musculoskeletal system     Problem List Patient Active Problem List   Diagnosis Date Noted  . Essential tremor 04/03/2017  . Elevated blood pressure reading 02/03/2017  . Failed total hip arthroplasty ([place]) 11/23/2016  . High risk medications (not anticoagulants) long-term use 09/20/2016  . Bilateral hand pain 09/20/2016  . DDD (degenerative disc disease), cervical 09/20/2016  . History of total hip replacement, bilateral 09/20/2016  . DDD (degenerative disc disease), lumbar 09/20/2016  . Bilateral plantar fasciitis 09/20/2016  . Psoriasis 09/20/2016  . Primary osteoarthritis of both feet 09/20/2016  . Primary osteoarthritis of both hands 09/20/2016  . Primary osteoarthritis of both knees 09/20/2016  . Prediabetes 09/22/2014  . Hyperlipidemia 09/22/2014  . URI (upper respiratory infection) 07/07/2012  . Acute bronchitis 07/07/2012  . HYPERLIPIDEMIA 07/03/2008  . OBESITY 07/03/2008  . AVASCULAR NECROSIS, FEMORAL HEAD 07/03/2008  . Psoriatic arthritis ([place]) 05/02/2001    Cliffton Spradley, PT 07/05/18 9:42 AM   [facility] [address], [place], [place], [address] Phone: [phone]   Fax:  [phone]  Name: [name] MRN: [id number] Date of Birth: 04-Sep-1965

## 2018-07-10 ENCOUNTER — Encounter: Payer: Self-pay | Admitting: Physical Therapy

## 2018-07-10 ENCOUNTER — Ambulatory Visit: Payer: 59 | Admitting: Physical Therapy

## 2018-07-10 DIAGNOSIS — R2689 Other abnormalities of gait and mobility: Secondary | ICD-10-CM | POA: Diagnosis not present

## 2018-07-10 DIAGNOSIS — R29898 Other symptoms and signs involving the musculoskeletal system: Secondary | ICD-10-CM | POA: Diagnosis not present

## 2018-07-10 DIAGNOSIS — M542 Cervicalgia: Secondary | ICD-10-CM | POA: Diagnosis not present

## 2018-07-10 NOTE — Therapy (Signed)
[facility] [address], [place], [place], [address] Phone: [phone]   Fax:  [phone]  Physical Therapy Treatment  Patient Details  Name: [name] MRN: [id number] Date of Birth: 07-Feb-1966 Referring Provider (PT): Eulas Post, MD   Encounter Date: 07/10/2018  PT End of Session - 07/10/18 0929    Visit Number  13    Date for PT Re-Evaluation  08/10/18    Authorization Type  UMR    PT Start Time  0845    PT Stop Time  0925    PT Time Calculation (min)  40 min    Activity Tolerance  Patient tolerated treatment well;No increased pain    Behavior During Therapy  WFL for tasks assessed/performed       Past Medical History:  Diagnosis Date  . History of avascular necrosis of capital femoral epiphysis 2006, 2007   Bilateral Femoral Head  . HLD (hyperlipidemia)   . Psoriatic arthritis [facility])     Past Surgical History:  Procedure Laterality Date  . JOINT REPLACEMENT Bilateral 2006, 2007   Necrosis femoral head (bilateral)  . TOTAL HIP REVISION Right 11/23/2016   Procedure: Acetabulum liner and femoral head revision;  Surgeon: [name], MD;  Location: [facility];  Service: Orthopedics;  Laterality: Right;    There were no vitals filed for this visit.  Subjective Assessment - 07/10/18 0852    Subjective  Pt states that things are going well. He is working on his HEP consistently.     Pertinent History  long time history of psoriatic arthritis, Bil THR, OA hands bil    Limitations  Sitting;Reading;Lifting    How long can you sit comfortably?  unlimited for neck    Patient Stated Goals  improve ROM, learn how to manage available range and maintain    Currently in Pain?  No/denies         [facility] PT Assessment - 07/10/18 0001      AROM   Cervical Flexion  40    Cervical Extension  35    Cervical - Right Side Bend  20    Cervical - Left Side Bend  35    Cervical - Right Rotation  50     Cervical - Left Rotation  50                   [facility] Adult PT Treatment/Exercise - 07/10/18 0001      Neck Exercises: Supine   Capital Flexion  5 reps    Capital Flexion Limitations  with rotation stretch over soft foam roll       Shoulder Exercises: Standing   Other Standing Exercises  lat stretch over foam roll x8 reps       Manual Therapy   Joint Mobilization  C1/C2 rotation mobilization with movement seated and supine x2 bouts; CPAs Grade III-IV C3-C5    Muscle Energy Technique  sidebending bil contract/relax             PT Education - 07/10/18 1335    Education Details  progress towards goals    Person(s) Educated  Patient    Methods  Explanation    Comprehension  Verbalized understanding       PT Short Term Goals - 06/28/18 0928      PT SHORT TERM GOAL #1   Title  Ind and compliant with initial HEP for frequent cervical ROM throughout day and postural adjustments during reading/computer time.  Time  3    Period  Weeks    Status  Achieved      PT SHORT TERM GOAL #2   Title  Pt will achieve at least 45 degrees bil neck rotation to improve functional activities such as turning head while driving.    Baseline  Rt 55 deg, Lt 45 deg    Time  4    Period  Weeks    Status  Achieved      PT SHORT TERM GOAL #3   Title  Pt will be able to demonstrate improved seated cervical posture and strategy for elevating reading materials to reduce strain on neck.    Baseline  using lumbar roll     Time  3    Period  Weeks    Status  Achieved        PT Long Term Goals - 06/28/18 4193      PT LONG TERM GOAL #1   Title  Pt will be ind in advanced HEP.    Time  8    Period  Weeks    Status  On-going      PT LONG TERM GOAL #2   Title  Pt will reduce FOTO to 35% demonstrating less limitation with functional activities.    Baseline  41%    Time  8    Period  Weeks    Status  On-going      PT LONG TERM GOAL #3   Title  Pt will report plans to attend  neighborhood gym or aquatic PT to maintain strength and ROM gains to allow greater ease of movement.    Time  8    Period  Weeks    Status  On-going      PT LONG TERM GOAL #4   Title  Pt will report at least 30% greater ease of head/neck movements throughout daily activities.    Baseline  pt unsure     Time  8    Period  Weeks    Status  Partially Met            Plan - 07/10/18 1031    Clinical Impression Statement  Pt has made good progress overall since beginning PT. He has increased lateral flexion to the Lt this visit. He has had 20 deg increase in cervical rotation as well, which has helped with his driving and other activity. Pt responds well to manual treatment, but would benefit from possible decrease in his PT frequency to allow for further work on joint mobility restrictions and cervical strength.    PT Frequency  2x / week    PT Duration  8 weeks    PT Treatment/Interventions  ADLs/Self Care Home Management;Aquatic Therapy;Electrical Stimulation;Moist Heat;Traction;Functional mobility training;Therapeutic exercise;Neuromuscular re-education;Patient/family education;Manual techniques;Passive range of motion;Dry needling;Taping;Energy conservation;Spinal Manipulations;Joint Manipulations    PT Next Visit Plan  re-eval extend cert 1x/week, lateral flexion ROM focus with mobilization to cervical spine and stretches; seated thoracic rotation/strength; cervical extension and rotation MWM; cervical retraction    PT Home Exercise Plan  [id number]     Consulted and Agree with Plan of Care  Patient       Patient will benefit from skilled therapeutic intervention in order to improve the following deficits and impairments:  Improper body mechanics, Decreased mobility, Postural dysfunction, Decreased activity tolerance, Decreased endurance, Decreased range of motion, Hypomobility, Impaired UE functional use  Visit Diagnosis: Cervicalgia  Other abnormalities of gait and  mobility  Other symptoms  and signs involving the musculoskeletal system     Problem List Patient Active Problem List   Diagnosis Date Noted  . Essential tremor 04/03/2017  . Elevated blood pressure reading 02/03/2017  . Failed total hip arthroplasty ([place]) 11/23/2016  . High risk medications (not anticoagulants) long-term use 09/20/2016  . Bilateral hand pain 09/20/2016  . DDD (degenerative disc disease), cervical 09/20/2016  . History of total hip replacement, bilateral 09/20/2016  . DDD (degenerative disc disease), lumbar 09/20/2016  . Bilateral plantar fasciitis 09/20/2016  . Psoriasis 09/20/2016  . Primary osteoarthritis of both feet 09/20/2016  . Primary osteoarthritis of both hands 09/20/2016  . Primary osteoarthritis of both knees 09/20/2016  . Prediabetes 09/22/2014  . Hyperlipidemia 09/22/2014  . URI (upper respiratory infection) 07/07/2012  . Acute bronchitis 07/07/2012  . HYPERLIPIDEMIA 07/03/2008  . OBESITY 07/03/2008  . AVASCULAR NECROSIS, FEMORAL HEAD 07/03/2008  . Psoriatic arthritis ([place]) 05/02/2001    1:47 PM,07/10/18 Sherol Dade PT, DPT [place] [facility] [place] [address], [place], [place], [address] Phone: [phone]   Fax:  [phone]  Name: [name] MRN: [id number] Date of Birth: 1966-04-02

## 2018-07-12 ENCOUNTER — Ambulatory Visit: Payer: 59 | Admitting: Physical Therapy

## 2018-07-12 ENCOUNTER — Other Ambulatory Visit: Payer: Self-pay

## 2018-07-12 DIAGNOSIS — M542 Cervicalgia: Secondary | ICD-10-CM

## 2018-07-12 DIAGNOSIS — R2689 Other abnormalities of gait and mobility: Secondary | ICD-10-CM

## 2018-07-12 DIAGNOSIS — R29898 Other symptoms and signs involving the musculoskeletal system: Secondary | ICD-10-CM

## 2018-07-13 ENCOUNTER — Encounter: Payer: Self-pay | Admitting: Physical Therapy

## 2018-07-13 NOTE — Therapy (Signed)
[facility] [address], [place], [place], [address] Phone: [phone]   Fax:  [phone]  Physical Therapy Treatment/Re-evaluation  Patient Details  Name: [name] MRN: [id number] Date of Birth: 1965-09-01 Referring Provider (PT): Eulas Post, MD   Encounter Date: 07/12/2018  PT End of Session - 07/13/18 0736    Visit Number  14    Date for PT Re-Evaluation  08/17/18    Authorization Type  UMR    PT Start Time  0843    PT Stop Time  0927    PT Time Calculation (min)  44 min    Activity Tolerance  Patient tolerated treatment well;No increased pain    Behavior During Therapy  WFL for tasks assessed/performed       Past Medical History:  Diagnosis Date  . History of avascular necrosis of capital femoral epiphysis 2006, 2007   Bilateral Femoral Head  . HLD (hyperlipidemia)   . Psoriatic arthritis [facility])     Past Surgical History:  Procedure Laterality Date  . JOINT REPLACEMENT Bilateral 2006, 2007   Necrosis femoral head (bilateral)  . TOTAL HIP REVISION Right 11/23/2016   Procedure: Acetabulum liner and femoral head revision;  Surgeon: [name], MD;  [facility]: [facility];  Service: Orthopedics;  Laterality: Right;    There were no vitals filed for this visit.  Subjective Assessment - 07/13/18 0736    Subjective  Pt states things continue to go well. He denies soreness following last session. He is working on his HEP. He does feel that his habits and his ROM has improved when looking while driving, etc.     Pertinent History  long time history of psoriatic arthritis, Bil THR, OA hands bil    Limitations  Sitting;Reading;Lifting    How long can you sit comfortably?  unlimited for neck    Patient Stated Goals  improve ROM, learn how to manage available range and maintain    Currently in Pain?  No/denies         [facility] PT Assessment - 07/13/18 0001      AROM   Cervical Flexion  40    Cervical Extension  35    Cervical - Right Side Bend  20    Cervical - Left Side Bend  35    Cervical - Right Rotation  50    Cervical - Left Rotation  50                   [facility] Adult PT Treatment/Exercise - 07/13/18 0001      Neck Exercises: Machines for Strengthening   UBE (Upper Arm Bike)  UBE L2 x2 min forward/backward, PT present to discuss progress      Neck Exercises: Seated   Neck Retraction  5 reps    Neck Retraction Limitations  HEP review      Neck Exercises: Supine   Other Supine Exercise  B horizontal abduction with blue TB and neck retraction x15 reps     Other Supine Exercise  BUE D2 flexion with green TB x15 reps       Manual Therapy   Joint Mobilization  Lateral cervica glide Lt and Rt seated and supine MWM 2x10 into Rt lateral flexion, x2 bouts each; grade IV CPAs cervical spine x1 bout; STM low cervical paraspinals/Lt and Rt upper trap; cervical retraction with extension in supine x10 reps with therapist assistance  PT Education - 07/13/18 0736    Education Details  technique with therex    Person(s) Educated  Patient    Methods  Explanation    Comprehension  Verbalized understanding       PT Short Term Goals - 07/13/18 0756      PT SHORT TERM GOAL #1   Title  Ind and compliant with initial HEP for frequent cervical ROM throughout day and postural adjustments during reading/computer time.    Time  3    Period  Weeks    Status  Achieved      PT SHORT TERM GOAL #2   Title  Pt will achieve at least 45 degrees bil neck rotation to improve functional activities such as turning head while driving.    Baseline  Rt 55 deg, Lt 45 deg    Time  4    Period  Weeks    Status  Achieved      PT SHORT TERM GOAL #3   Title  Pt will be able to demonstrate improved seated cervical posture and strategy for elevating reading materials to reduce strain on neck.    Baseline  using lumbar roll     Time  3    Period  Weeks    Status   Achieved        PT Long Term Goals - 07/13/18 0756      PT LONG TERM GOAL #1   Title  Pt will be ind in advanced HEP.    Time  8    Period  Weeks    Status  On-going      PT LONG TERM GOAL #2   Title  Pt will reduce FOTO to 35% demonstrating less limitation with functional activities.    Baseline  41%    Time  8    Period  Weeks    Status  On-going      PT LONG TERM GOAL #3   Title  Pt will report plans to attend neighborhood gym or aquatic PT to maintain strength and ROM gains to allow greater ease of movement.    Time  8    Period  Weeks    Status  Partially Met      PT LONG TERM GOAL #4   Title  Pt will report at least 30% greater ease of head/neck movements throughout daily activities.    Baseline  pt unsure     Time  8    Period  Weeks    Status  Partially Met      PT LONG TERM GOAL #5   Title  Pt will have increase in lateral cervical flexion to atleast 30 deg which will assist with cervical rotation mechanics.     Time  6    Period  Weeks    Status  New    Target Date  08/17/18            Plan - 07/13/18 0737    Clinical Impression Statement  Continued this visit with therex to further promote lateral cervical flexion and cervical rotation. He has met nearly half of his long term goals. Pt's cervical ROM is improved overall with cervical rotation up to 50 deg bilaterally, cervical flexion to 40 degrees and his pain is resolved throughout the day. He notes improvements in his daily activity and body awareness. Due to improvements thus far during his POC, he would continue to benefit from skilled PT to further address cervical mobility  and strength limitations. He was agreeable with decrease in PT frequency to 1 time a week which will allow for further PT intervention and encourage pt independence with his home program in preparation for discharge    PT Frequency  1x / week    PT Duration  6 weeks    PT Treatment/Interventions  ADLs/Self Care Home  Management;Aquatic Therapy;Electrical Stimulation;Moist Heat;Traction;Functional mobility training;Therapeutic exercise;Neuromuscular re-education;Patient/family education;Manual techniques;Passive range of motion;Dry needling;Taping;Energy conservation;Spinal Manipulations;Joint Manipulations    PT Next Visit Plan  DN low cervical multifidi; lateral flexion ROM focus with mobilization to cervical spine and stretches; seated thoracic rotation/strength; cervical extension and rotation MWM; cervical retraction    PT Home Exercise Plan  [id number]     Consulted and Agree with Plan of Care  Patient       Patient will benefit from skilled therapeutic intervention in order to improve the following deficits and impairments:  Improper body mechanics, Decreased mobility, Postural dysfunction, Decreased activity tolerance, Decreased endurance, Decreased range of motion, Hypomobility, Impaired UE functional use  Visit Diagnosis: Cervicalgia  Other abnormalities of gait and mobility  Other symptoms and signs involving the musculoskeletal system     Problem List Patient Active Problem List   Diagnosis Date Noted  . Essential tremor 04/03/2017  . Elevated blood pressure reading 02/03/2017  . Failed total hip arthroplasty ([place]) 11/23/2016  . High risk medications (not anticoagulants) long-term use 09/20/2016  . Bilateral hand pain 09/20/2016  . DDD (degenerative disc disease), cervical 09/20/2016  . History of total hip replacement, bilateral 09/20/2016  . DDD (degenerative disc disease), lumbar 09/20/2016  . Bilateral plantar fasciitis 09/20/2016  . Psoriasis 09/20/2016  . Primary osteoarthritis of both feet 09/20/2016  . Primary osteoarthritis of both hands 09/20/2016  . Primary osteoarthritis of both knees 09/20/2016  . Prediabetes 09/22/2014  . Hyperlipidemia 09/22/2014  . URI (upper respiratory infection) 07/07/2012  . Acute bronchitis 07/07/2012  . HYPERLIPIDEMIA 07/03/2008  . OBESITY  07/03/2008  . AVASCULAR NECROSIS, FEMORAL HEAD 07/03/2008  . Psoriatic arthritis ([place]) 05/02/2001    8:51 AM,07/13/18 Sherol Dade PT, DPT [place] [facility] [place] [facility] [address] [place], [place], [address] Phone: [phone]   Fax:  [phone]  Name: [name] MRN: [id number] Date of Birth: 04-22-1966

## 2018-07-17 ENCOUNTER — Other Ambulatory Visit: Payer: Self-pay

## 2018-07-17 ENCOUNTER — Ambulatory Visit: Payer: 59 | Admitting: Physical Therapy

## 2018-07-17 ENCOUNTER — Encounter: Payer: Self-pay | Admitting: Physical Therapy

## 2018-07-17 DIAGNOSIS — M542 Cervicalgia: Secondary | ICD-10-CM

## 2018-07-17 DIAGNOSIS — R2689 Other abnormalities of gait and mobility: Secondary | ICD-10-CM | POA: Diagnosis not present

## 2018-07-17 DIAGNOSIS — R29898 Other symptoms and signs involving the musculoskeletal system: Secondary | ICD-10-CM

## 2018-07-17 NOTE — Therapy (Signed)
[facility] [address] [place], [place], [address] Phone: [phone]   Fax:  [phone]  Physical Therapy Treatment  Patient Details  Name: [name] MRN: [id number] Date of Birth: 13-Apr-1966 Referring Provider (PT): Eulas Post, MD   Encounter Date: 07/17/2018  PT End of Session - 07/17/18 1226    Visit Number  15    Date for PT Re-Evaluation  08/17/18    Authorization Type  UMR KX modifiers    PT Start Time  1146    PT Stop Time  1225   dry needling   PT Time Calculation (min)  39 min    Activity Tolerance  Patient tolerated treatment well;No increased pain    Behavior During Therapy  WFL for tasks assessed/performed       Past Medical History:  Diagnosis Date  . History of avascular necrosis of capital femoral epiphysis 2006, 2007   Bilateral Femoral Head  . HLD (hyperlipidemia)   . Psoriatic arthritis [facility])     Past Surgical History:  Procedure Laterality Date  . JOINT REPLACEMENT Bilateral 2006, 2007   Necrosis femoral head (bilateral)  . TOTAL HIP REVISION Right 11/23/2016   Procedure: Acetabulum liner and femoral head revision;  Surgeon: [name], MD;  Location: [facility];  Service: Orthopedics;  Laterality: Right;    There were no vitals filed for this visit.  Subjective Assessment - 07/17/18 1149    Subjective  Pt states things continue to go well. No issues at this time.     Pertinent History  long time history of psoriatic arthritis, Bil THR, OA hands bil    Limitations  Sitting;Reading;Lifting    How long can you sit comfortably?  unlimited for neck    Patient Stated Goals  improve ROM, learn how to manage available range and maintain    Currently in Pain?  No/denies                       [facility] Adult PT Treatment/Exercise - 07/17/18 0001      Neck Exercises: Machines for Strengthening   UBE (Upper Arm Bike)  UBE L2 x2 min forward/backward, PT present to  discuss progress      Manual Therapy   Manual therapy comments  lateral cervical flexion stretch 5x10 sec each     Joint Mobilization  Lateral cervica glide Lt and Rt seated and supine MWM 2x10 into Rt lateral flexion, x2 bouts each; grade IV CPAs cervical spine x1 bout; STM low cervical paraspinals/Lt and Rt upper trap    Soft tissue mobilization  STM cervical paraspinals       Trigger Point Dry Needling - 07/17/18 0001    Consent Given?  Yes    Education Handout Provided  Previously provided    Muscles Treated Head and Neck  Splenius capitus;Cervical multifidi   Multifid C7 to C3 Lt and Rt    Splenius capitus Response  Twitch reponse elicited;Palpable increased muscle length    Cervical multifidi Response  Twitch reponse elicited;Palpable increased muscle length           PT Education - 07/17/18 1225    Education Details  implications for dry needling; expected slow progress per his diagnosis    Person(s) Educated  Patient    Methods  Explanation    Comprehension  Verbalized understanding       PT Short Term Goals - 07/13/18 0756      PT  SHORT TERM GOAL #1   Title  Ind and compliant with initial HEP for frequent cervical ROM throughout day and postural adjustments during reading/computer time.    Time  3    Period  Weeks    Status  Achieved      PT SHORT TERM GOAL #2   Title  Pt will achieve at least 45 degrees bil neck rotation to improve functional activities such as turning head while driving.    Baseline  Rt 55 deg, Lt 45 deg    Time  4    Period  Weeks    Status  Achieved      PT SHORT TERM GOAL #3   Title  Pt will be able to demonstrate improved seated cervical posture and strategy for elevating reading materials to reduce strain on neck.    Baseline  using lumbar roll     Time  3    Period  Weeks    Status  Achieved        PT Long Term Goals - 07/13/18 0756      PT LONG TERM GOAL #1   Title  Pt will be ind in advanced HEP.    Time  8    Period   Weeks    Status  On-going      PT LONG TERM GOAL #2   Title  Pt will reduce FOTO to 35% demonstrating less limitation with functional activities.    Baseline  41%    Time  8    Period  Weeks    Status  On-going      PT LONG TERM GOAL #3   Title  Pt will report plans to attend neighborhood gym or aquatic PT to maintain strength and ROM gains to allow greater ease of movement.    Time  8    Period  Weeks    Status  Partially Met      PT LONG TERM GOAL #4   Title  Pt will report at least 30% greater ease of head/neck movements throughout daily activities.    Baseline  pt unsure     Time  8    Period  Weeks    Status  Partially Met      PT LONG TERM GOAL #5   Title  Pt will have increase in lateral cervical flexion to atleast 30 deg which will assist with cervical rotation mechanics.     Time  6    Period  Weeks    Status  New    Target Date  08/17/18            Plan - 07/17/18 1226    Clinical Impression Statement  Focused today on manual techniques and dry needling to further increase cervical ROM. Pt has significant joint mobility restrictions throughout the cervical spine, but there were several twitch responses noted during dry needling. Pt was encouraged to complete his stretches and other HEP exercises once he gets home in order to reinforce the soft tissue and joint mobility work completed during today's visit. He reported no increase in pain end of session.     PT Frequency  1x / week    PT Duration  6 weeks    PT Treatment/Interventions  ADLs/Self Care Home Management;Aquatic Therapy;Electrical Stimulation;Moist Heat;Traction;Functional mobility training;Therapeutic exercise;Neuromuscular re-education;Patient/family education;Manual techniques;Passive range of motion;Dry needling;Taping;Energy conservation;Spinal Manipulations;Joint Manipulations    PT Next Visit Plan  DN low cervical multifidi; lateral flexion ROM focus with mobilization to  cervical spine and  stretches; seated thoracic rotation/strength; cervical extension and rotation MWM; cervical retraction    PT Home Exercise Plan  [id number]     Consulted and Agree with Plan of Care  Patient       Patient will benefit from skilled therapeutic intervention in order to improve the following deficits and impairments:  Improper body mechanics, Decreased mobility, Postural dysfunction, Decreased activity tolerance, Decreased endurance, Decreased range of motion, Hypomobility, Impaired UE functional use  Visit Diagnosis: Cervicalgia  Other abnormalities of gait and mobility  Other symptoms and signs involving the musculoskeletal system     Problem List Patient Active Problem List   Diagnosis Date Noted  . Essential tremor 04/03/2017  . Elevated blood pressure reading 02/03/2017  . Failed total hip arthroplasty ([place]) 11/23/2016  . High risk medications (not anticoagulants) long-term use 09/20/2016  . Bilateral hand pain 09/20/2016  . DDD (degenerative disc disease), cervical 09/20/2016  . History of total hip replacement, bilateral 09/20/2016  . DDD (degenerative disc disease), lumbar 09/20/2016  . Bilateral plantar fasciitis 09/20/2016  . Psoriasis 09/20/2016  . Primary osteoarthritis of both feet 09/20/2016  . Primary osteoarthritis of both hands 09/20/2016  . Primary osteoarthritis of both knees 09/20/2016  . Prediabetes 09/22/2014  . Hyperlipidemia 09/22/2014  . URI (upper respiratory infection) 07/07/2012  . Acute bronchitis 07/07/2012  . HYPERLIPIDEMIA 07/03/2008  . OBESITY 07/03/2008  . AVASCULAR NECROSIS, FEMORAL HEAD 07/03/2008  . Psoriatic arthritis ([place]) 05/02/2001    1:20 PM,07/17/18 Sherol Dade PT, DPT [place] [facility] [place] [facility] [address] [place], [place], [address] Phone: [phone]   Fax:  [phone]  Name: [name] MRN:  [id number] Date of Birth: 08-04-1965

## 2018-07-18 MED FILL — ENBREL 50 MG/ML SOSY: 50 | 28 days supply | Qty: 4 | Fill #1

## 2018-07-24 ENCOUNTER — Ambulatory Visit: Payer: 59 | Admitting: Physical Therapy

## 2018-08-02 ENCOUNTER — Encounter: Payer: 59 | Admitting: Physical Therapy

## 2018-08-07 ENCOUNTER — Encounter: Payer: 59 | Admitting: Physical Therapy

## 2018-08-11 ENCOUNTER — Other Ambulatory Visit: Payer: Self-pay | Admitting: Rheumatology

## 2018-08-13 ENCOUNTER — Ambulatory Visit: Payer: 59 | Admitting: Rheumatology

## 2018-08-13 NOTE — Telephone Encounter (Signed)
Last Visit: 06/22/2018 Next Visit: 11/21/2018 Labs: 06/22/2018 stable  TB Gold: 02/08/2018 neg  Okay to refill per Dr. Estanislado Pandy.

## 2018-08-14 ENCOUNTER — Ambulatory Visit: Payer: 59 | Admitting: Physical Therapy

## 2018-08-14 ENCOUNTER — Other Ambulatory Visit: Payer: Self-pay | Admitting: Pharmacist

## 2018-08-14 MED ORDER — ETANERCEPT 50 MG/ML ~~LOC~~ SOSY: PREFILLED_SYRINGE | SUBCUTANEOUS | 0 refills | Status: DC

## 2018-08-22 MED FILL — ENBREL 50 MG/ML SOSY: 50 | 28 days supply | Qty: 4 | Fill #0

## 2018-08-28 ENCOUNTER — Encounter: Payer: Self-pay | Admitting: Pharmacist

## 2018-08-28 ENCOUNTER — Other Ambulatory Visit: Payer: Self-pay

## 2018-08-28 ENCOUNTER — Ambulatory Visit (INDEPENDENT_AMBULATORY_CARE_PROVIDER_SITE_OTHER): Payer: 59 | Admitting: Pharmacist

## 2018-08-28 DIAGNOSIS — Z79899 Other long term (current) drug therapy: Secondary | ICD-10-CM

## 2018-08-28 NOTE — Progress Notes (Signed)
   S: Patient presents virtually today for review of his specialty medication.  Patient is currently taking Enbrel for psoriatic arthritis. Patient is managed by Dr. Estanislado Pandy for this.   Adherence: no recent missed doses  Efficacy: continues to work well for him  Dosing: Enbrel 50 mg weekly  Drug-drug interactions: none  Screening: TB test: completed per patient (negative) Hepatitis: completed per patient  Monitoring: S/sx of infection: denies CBC: done q3 months, see below S/sx of hypersensitivity: denies S/sx of malignancy: denies S/sx of heart failure: denies    O:     Lab Results  Component Value Date   WBC 4.9 06/22/2018   HGB 17.3 (H) 06/22/2018   HCT 49.2 06/22/2018   MCV 92.0 06/22/2018   PLT 134 (L) 06/22/2018      Chemistry      Component Value Date/Time   NA 139 06/22/2018 1136   K 4.5 06/22/2018 1136   CL 103 06/22/2018 1136   CO2 27 06/22/2018 1136   BUN 19 06/22/2018 1136   CREATININE 1.04 06/22/2018 1136      Component Value Date/Time   CALCIUM 9.7 06/22/2018 1136   ALKPHOS 98 12/27/2016 0935   AST 25 06/22/2018 1136   ALT 41 06/22/2018 1136   BILITOT 0.9 06/22/2018 1136       A/P: 1. Medication review: patient on Enbrel for psoriatic arthritis and is tolerating it well.  No questions or concerns about medication. Reviewed the medication with him, including the increase risk of infection, the need for close monitoring of lab work, the possible increased risk of malignancy, and the risk of injection site reactions. No recommendations for changes.   Christella Hartigan, PharmD, BCPS, BCACP, CPP Clinical Pharmacist Practitioner  [phone]

## 2018-09-13 ENCOUNTER — Ambulatory Visit: Payer: 59 | Attending: Family Medicine | Admitting: Physical Therapy

## 2018-09-13 ENCOUNTER — Encounter: Payer: Self-pay | Admitting: Physical Therapy

## 2018-09-13 ENCOUNTER — Other Ambulatory Visit: Payer: Self-pay

## 2018-09-13 DIAGNOSIS — M542 Cervicalgia: Secondary | ICD-10-CM | POA: Insufficient documentation

## 2018-09-13 DIAGNOSIS — R29898 Other symptoms and signs involving the musculoskeletal system: Secondary | ICD-10-CM | POA: Diagnosis not present

## 2018-09-13 DIAGNOSIS — R2689 Other abnormalities of gait and mobility: Secondary | ICD-10-CM | POA: Insufficient documentation

## 2018-09-13 NOTE — Therapy (Signed)
[facility] [address] [place], [place], [address] Phone: [phone]   Fax:  [phone]  Physical Therapy Treatment/re-evaluation  Patient Details  Name: [name] MRN: [id number] Date of Birth: 1965/06/23 Referring Provider (PT): Eulas Post, MD   Encounter Date: 09/13/2018  PT End of Session - 09/13/18 0959    Visit Number  16    Date for PT Re-Evaluation  10/12/18    Authorization Type  UMR KX modifiers    PT Start Time  0918    PT Stop Time  0957    PT Time Calculation (min)  39 min    Activity Tolerance  Patient tolerated treatment well;No increased pain    Behavior During Therapy  WFL for tasks assessed/performed       Past Medical History:  Diagnosis Date  . History of avascular necrosis of capital femoral epiphysis 2006, 2007   Bilateral Femoral Head  . HLD (hyperlipidemia)   . Psoriatic arthritis [facility])     Past Surgical History:  Procedure Laterality Date  . JOINT REPLACEMENT Bilateral 2006, 2007   Necrosis femoral head (bilateral)  . TOTAL HIP REVISION Right 11/23/2016   Procedure: Acetabulum liner and femoral head revision;  Surgeon: [name], MD;  Location: [facility];  Service: Orthopedics;  Laterality: Right;    There were no vitals filed for this visit.  Subjective Assessment - 09/13/18 0919    Subjective  Pt states that he has been doing well. He will get stiff but is able to do his exercises to improve this. He denies pain.     Pertinent History  long time history of psoriatic arthritis, Bil THR, OA hands bil    Limitations  Sitting;Reading;Lifting    How long can you sit comfortably?  unlimited for neck    Patient Stated Goals  improve ROM, learn how to manage available range and maintain    Currently in Pain?  No/denies         [facility] PT Assessment - 09/13/18 0001      Assessment   Medical Diagnosis  M43.6 (ICD-10-CM) - Neck stiffness    Referring Provider (PT)   Burchette, [name], MD    Onset Date/Surgical Date  --   when Pt was in mid-20s   Hand Dominance  Right    Next MD Visit  no   sees rheumatologist in 1.5 mos   Prior Therapy  for lumbar      Precautions   Precautions  None   bil THR     Restrictions   Weight Bearing Restrictions  No      Balance Screen   Has the patient fallen in the past 6 months  No    Has the patient had a decrease in activity level because of a fear of falling?   No    Is the patient reluctant to leave their home because of a fear of falling?   No      Home Environment   Living Environment  Private residence    Living Arrangements  Spouse/significant other    Type of [place] to enter    Entrance Stairs-Number of Steps  1    Entrance Stairs-Rails  None    Home Layout  Two level    Alternate Level Stairs-Number of Steps  10    Alternate Level Stairs-Rails  Right   Pt reports no difficulty with stairs  Additional Comments  uses sock assister for donning socks, uses a reacher sometimes      Prior Function   Level of Independence  Independent    Vocation  On disability    Leisure  reading, family time, walk dogs      Cognition   Overall Cognitive Status  Within Functional Limits for tasks assessed      Observation/Other Assessments   Observations  Pt holds head in Lt lateral tilt    Focus on Therapeutic Outcomes (FOTO)   28%   goal 35%     Sensation   Light Touch  Appears Intact      Posture/Postural Control   Posture/Postural Control  Postural limitations    Postural Limitations  --    Posture Comments  within normal limits, slight forward head noted but not significant       AROM   Overall AROM   Deficits    Overall AROM Comments  bil shoulders WFL with exception of bil IR to low lumbar only    Cervical Flexion  40    Cervical Extension  25    Cervical - Right Side Bend  20    Cervical - Left Side Bend  30    Cervical - Right Rotation  50    Cervical -  Left Rotation  40      Strength   Overall Strength  Within functional limits for tasks performed    Overall Strength Comments  grossly 5/5 throughout cervical and UEs      Palpation   Spinal mobility  global stiffness throughout cervical spine, most limitation noted: poor joint mobility Rt O/A, Rt A/A, all U-joint side glides on Rt    Palpation comment  TTP: Lt > Rt upper trap      Special Tests   Other special tests  relief with manual cervical traction      Transfers   Comments  moves slower due to stiffness      Ambulation/Gait   Gait Comments  walks somewhat slowly due to stiffness                   [facility] Adult PT Treatment/Exercise - 09/13/18 0001      Neck Exercises: Machines for Strengthening   UBE (Upper Arm Bike)  UBE L2 x2 min forward/backward, PT present to discuss progress      Neck Exercises: Seated   Neck Retraction  10 reps    Neck Retraction Limitations  with overpressure; x10 reps overpressure into extension     Cervical Rotation Limitations  Lt and Rt with overpressure x10 reps       Manual Therapy   Manual therapy comments  passive Lt upper trap stretch 3x15 sec     Joint Mobilization  Lt lateral cervical glide with active sidebend 2x10 reps                PT Short Term Goals - 09/13/18 1000      PT SHORT TERM GOAL #1   Title  Ind and compliant with initial HEP for frequent cervical ROM throughout day and postural adjustments during reading/computer time.    Time  3    Period  Weeks    Status  Achieved      PT SHORT TERM GOAL #2   Title  Pt will achieve at least 45 degrees bil neck rotation to improve functional activities such as turning head while driving.    Baseline  Rt 50  deg, Lt 40 deg    Time  4    Period  Weeks    Status  Partially Met      PT SHORT TERM GOAL #3   Title  Pt will be able to demonstrate improved seated cervical posture and strategy for elevating reading materials to reduce strain on neck.    Baseline   using lumbar roll     Time  3    Period  Weeks    Status  Achieved        PT Long Term Goals - 09/13/18 1000      PT LONG TERM GOAL #1   Title  Pt will be ind in advanced HEP.    Time  4    Period  Weeks    Status  Partially Met    Target Date  10/11/18      PT LONG TERM GOAL #2   Title  Pt will reduce FOTO to 35% demonstrating less limitation with functional activities.    Baseline  28%    Time  8    Period  Weeks    Status  Achieved      PT LONG TERM GOAL #3   Title  Pt will report plans to attend neighborhood gym or aquatic PT to maintain strength and ROM gains to allow greater ease of movement.    Time  8    Period  Weeks    Status  Partially Met      PT LONG TERM GOAL #4   Title  Pt will report at least 30% greater ease of head/neck movements throughout daily activities.    Baseline  pt unsure     Time  8    Period  Weeks    Status  Partially Met      PT LONG TERM GOAL #5   Title  Pt will have increase in lateral cervical flexion to atleast 30 deg which will assist with cervical rotation mechanics.     Baseline  20 deg Rt and 25 deg Lt     Time  6    Period  Weeks    Status  Partially Met            Plan - 09/13/18 0959    Clinical Impression Statement  Pt was re-evaluated this visit having been out of PT for 2 months due to COVID-19 restrictions. He has been diligently completing his HEP and was able to maintain cervical flexion and Rt rotation ROM from his last re-assessment. He does demonstrate decline in active ROM into cervical extension and Lt rotation and side bend. His HEP was reviewed and required some correction with this to ensure proper technique. Therapist completed manual treatment to the cervical spine with approximately 5 degrees of improvement noted in lateral flexion and extension end of session. Pt would benefit from 4 more visits of skilled PT to further progress independence with his HEP and make further gains in cervical active ROM for  independence with daily activity.     PT Frequency  1x / week    PT Duration  4 weeks    PT Treatment/Interventions  ADLs/Self Care Home Management;Aquatic Therapy;Electrical Stimulation;Moist Heat;Traction;Functional mobility training;Therapeutic exercise;Neuromuscular re-education;Patient/family education;Manual techniques;Passive range of motion;Dry needling;Taping;Energy conservation;Spinal Manipulations;Joint Manipulations    PT Next Visit Plan  manual to improve extension and lateral flexion ROM focus with mobilization to cervical spine and stretches; f/u on HEP retraction with movement; update bands; seated thoracic rotation/strength; cervical extension  and rotation MWM; cervical retraction    PT Home Exercise Plan  [email or web address]     Consulted and Agree with Plan of Care  Patient       Patient will benefit from skilled therapeutic intervention in order to improve the following deficits and impairments:  Improper body mechanics, Decreased mobility, Postural dysfunction, Decreased activity tolerance, Decreased endurance, Decreased range of motion, Hypomobility, Impaired UE functional use  Visit Diagnosis: Cervicalgia  Other abnormalities of gait and mobility  Other symptoms and signs involving the musculoskeletal system     Problem List Patient Active Problem List   Diagnosis Date Noted  . Essential tremor 04/03/2017  . Elevated blood pressure reading 02/03/2017  . Failed total hip arthroplasty ([place]) 11/23/2016  . High risk medications (not anticoagulants) long-term use 09/20/2016  . Bilateral hand pain 09/20/2016  . DDD (degenerative disc disease), cervical 09/20/2016  . History of total hip replacement, bilateral 09/20/2016  . DDD (degenerative disc disease), lumbar 09/20/2016  . Bilateral plantar fasciitis 09/20/2016  . Psoriasis 09/20/2016  . Primary osteoarthritis of both feet 09/20/2016  . Primary osteoarthritis of both hands 09/20/2016  . Primary osteoarthritis of both  knees 09/20/2016  . Prediabetes 09/22/2014  . Hyperlipidemia 09/22/2014  . URI (upper respiratory infection) 07/07/2012  . Acute bronchitis 07/07/2012  . HYPERLIPIDEMIA 07/03/2008  . OBESITY 07/03/2008  . AVASCULAR NECROSIS, FEMORAL HEAD 07/03/2008  . Psoriatic arthritis ([place]) 05/02/2001   10:11 AM,09/13/18 Sherol Dade PT, DPT [place] [facility] [address], [place], [place], [address] Phone: [phone]   Fax:  [phone]  Name: [name] MRN: [id number] Date of Birth: 10-Nov-1965

## 2018-09-13 NOTE — Patient Instructions (Signed)
Access Code: [email or web address]  URL: [email or web address]  Date: 09/13/2018  Prepared by: Sherol Dade   Exercises  Standing Row with Anchored Resistance - 15 reps - 2 sets - 1x daily - 7x weekly  Seated Cervical Retraction and Rotation - 10 reps - 1-2 sets - 1x daily - 7x weekly  Seated Assisted Cervical Rotation with Towel - 10 reps - 3 sets - 2x daily - 7x weekly  Cervical Retraction with Overpressure - 10 reps - 1 sets - 5 hold - 2x daily - 7x weekly  Seated Trunk Rotation Stretch - 10 reps - 1 sets - 2x daily - 7x weekly  Seated Single Arm Reach Down with Trunk Rotation - 10 reps - 1 sets - 2x daily - 7x weekly  Supine Shoulder Horizontal Abduction with Resistance - 10 reps - 3 sets - 1x daily - 7x weekly  Seated Cervical Sidebending Stretch - 2 sets - 20 hold - 2x daily - 7x weekly    [facility] [address] [place], [place] [address] Phone # [phone] Fax [phone]

## 2018-09-20 ENCOUNTER — Ambulatory Visit: Payer: 59 | Admitting: Physical Therapy

## 2018-09-20 ENCOUNTER — Other Ambulatory Visit: Payer: Self-pay

## 2018-09-20 ENCOUNTER — Encounter: Payer: Self-pay | Admitting: Physical Therapy

## 2018-09-20 DIAGNOSIS — M542 Cervicalgia: Secondary | ICD-10-CM | POA: Diagnosis not present

## 2018-09-20 DIAGNOSIS — R29898 Other symptoms and signs involving the musculoskeletal system: Secondary | ICD-10-CM | POA: Diagnosis not present

## 2018-09-20 DIAGNOSIS — R2689 Other abnormalities of gait and mobility: Secondary | ICD-10-CM | POA: Diagnosis not present

## 2018-09-20 NOTE — Therapy (Signed)
[facility] [address], [place], [place], [address] Phone: [phone]   Fax:  [phone]  Physical Therapy Treatment  Patient Details  Name: [name] MRN: [id number] Date of Birth: 07-09-1965 Referring Provider (PT): Eulas Post, MD   Encounter Date: 09/20/2018  PT End of Session - 09/20/18 0859    Visit Number  17    Date for PT Re-Evaluation  10/12/18    Authorization Type  UMR KX modifiers    PT Start Time  0816    PT Stop Time  0856    PT Time Calculation (min)  40 min    Activity Tolerance  Patient tolerated treatment well;No increased pain    Behavior During Therapy  WFL for tasks assessed/performed       Past Medical History:  Diagnosis Date  . History of avascular necrosis of capital femoral epiphysis 2006, 2007   Bilateral Femoral Head  . HLD (hyperlipidemia)   . Psoriatic arthritis [facility])     Past Surgical History:  Procedure Laterality Date  . JOINT REPLACEMENT Bilateral 2006, 2007   Necrosis femoral head (bilateral)  . TOTAL HIP REVISION Right 11/23/2016   Procedure: Acetabulum liner and femoral head revision;  Surgeon: [name], MD;  Location: [facility];  Service: Orthopedics;  Laterality: Right;    There were no vitals filed for this visit.  Subjective Assessment - 09/20/18 0818    Subjective  Pt states that things are going well. He tried the changes made at the last appointment.     Pertinent History  long time history of psoriatic arthritis, Bil THR, OA hands bil    Limitations  Sitting;Reading;Lifting    How long can you sit comfortably?  unlimited for neck    Patient Stated Goals  improve ROM, learn how to manage available range and maintain    Currently in Pain?  No/denies         [facility] PT Assessment - 09/20/18 0001      AROM   Cervical Extension  30    Cervical - Right Rotation  50    Cervical - Left Rotation  45                   [facility] Adult  PT Treatment/Exercise - 09/20/18 0001      Neck Exercises: Seated   Cervical Isometrics  Right lateral flexion    Cervical Isometrics Limitations  7x10 sec      Shoulder Exercises: Seated   Other Seated Exercises  horizontal abduction with blue TB x8 reps, x5 reps without TB for proper shoulder activation      Manual Therapy   Joint Mobilization  Grade III-IV CPAs cervical and upper thoracic spine x2 bouts; Lt and Rt upper cervical mobilization x2 bouts 15 ossilations/isometric hold and stretch to the Lt; Rt lateral flexion MWM 2x10 reps mid to lower Cervical spine    Soft tissue mobilization  STM cervical paraspinals     Muscle Energy Technique  Rt lateral flexion isometric contract relax             PT Education - 09/20/18 0859    Education Details  updated and reviewed HEP    Person(s) Educated  Patient    Methods  Explanation;Verbal cues    Comprehension  Verbalized understanding;Returned demonstration       PT Short Term Goals - 09/13/18 1000      PT SHORT TERM GOAL #1   Title  Ind and compliant with initial HEP for frequent cervical ROM throughout day and postural adjustments during reading/computer time.    Time  3    Period  Weeks    Status  Achieved      PT SHORT TERM GOAL #2   Title  Pt will achieve at least 45 degrees bil neck rotation to improve functional activities such as turning head while driving.    Baseline  Rt 50 deg, Lt 40 deg    Time  4    Period  Weeks    Status  Partially Met      PT SHORT TERM GOAL #3   Title  Pt will be able to demonstrate improved seated cervical posture and strategy for elevating reading materials to reduce strain on neck.    Baseline  using lumbar roll     Time  3    Period  Weeks    Status  Achieved        PT Long Term Goals - 09/13/18 1000      PT LONG TERM GOAL #1   Title  Pt will be ind in advanced HEP.    Time  4    Period  Weeks    Status  Partially Met    Target Date  10/11/18      PT LONG TERM GOAL  #2   Title  Pt will reduce FOTO to 35% demonstrating less limitation with functional activities.    Baseline  28%    Time  8    Period  Weeks    Status  Achieved      PT LONG TERM GOAL #3   Title  Pt will report plans to attend neighborhood gym or aquatic PT to maintain strength and ROM gains to allow greater ease of movement.    Time  8    Period  Weeks    Status  Partially Met      PT LONG TERM GOAL #4   Title  Pt will report at least 30% greater ease of head/neck movements throughout daily activities.    Baseline  pt unsure     Time  8    Period  Weeks    Status  Partially Met      PT LONG TERM GOAL #5   Title  Pt will have increase in lateral cervical flexion to atleast 30 deg which will assist with cervical rotation mechanics.     Baseline  20 deg Rt and 25 deg Lt     Time  6    Period  Weeks    Status  Partially Met            Plan - 09/20/18 0859    Clinical Impression Statement  Pt returns today having maintained cervical active ROM from 1 week ago, with additional 5 degrees of active cervical extension. He has been completing his HEP regularly and reports no concerns with the most recent updates. Session focused primarily on manual techniques to increase mid and upper cervical spine mobility. Pt noted improved ease with movement and had 5 deg increase in cervical rotation following today's treatment. Pt's resistance exercises for home were updated, and he required some tactile cues for improved scapular control during horizontal abduction. Will continue with current POC.     PT Frequency  1x / week    PT Duration  4 weeks    PT Treatment/Interventions  ADLs/Self Care Home Management;Aquatic Therapy;Electrical Stimulation;Moist Heat;Traction;Functional mobility training;Therapeutic exercise;Neuromuscular re-education;Patient/family  education;Manual techniques;Passive range of motion;Dry needling;Taping;Energy conservation;Spinal Manipulations;Joint Manipulations    PT  Next Visit Plan  manual to improve extension and lateral flexion ROM focus with mobilization to cervical spine and stretches; f/u on HEP retraction with movement; update bands; seated thoracic rotation/strength; cervical extension and rotation MWM; cervical retraction    PT Home Exercise Plan  [email or web address]     Consulted and Agree with Plan of Care  Patient       Patient will benefit from skilled therapeutic intervention in order to improve the following deficits and impairments:  Improper body mechanics, Decreased mobility, Postural dysfunction, Decreased activity tolerance, Decreased endurance, Decreased range of motion, Hypomobility, Impaired UE functional use  Visit Diagnosis: Cervicalgia  Other abnormalities of gait and mobility  Other symptoms and signs involving the musculoskeletal system     Problem List Patient Active Problem List   Diagnosis Date Noted  . Essential tremor 04/03/2017  . Elevated blood pressure reading 02/03/2017  . Failed total hip arthroplasty ([place]) 11/23/2016  . High risk medications (not anticoagulants) long-term use 09/20/2016  . Bilateral hand pain 09/20/2016  . DDD (degenerative disc disease), cervical 09/20/2016  . History of total hip replacement, bilateral 09/20/2016  . DDD (degenerative disc disease), lumbar 09/20/2016  . Bilateral plantar fasciitis 09/20/2016  . Psoriasis 09/20/2016  . Primary osteoarthritis of both feet 09/20/2016  . Primary osteoarthritis of both hands 09/20/2016  . Primary osteoarthritis of both knees 09/20/2016  . Prediabetes 09/22/2014  . Hyperlipidemia 09/22/2014  . URI (upper respiratory infection) 07/07/2012  . Acute bronchitis 07/07/2012  . HYPERLIPIDEMIA 07/03/2008  . OBESITY 07/03/2008  . AVASCULAR NECROSIS, FEMORAL HEAD 07/03/2008  . Psoriatic arthritis ([place]) 05/02/2001    9:03 AM,09/20/18 Sherol Dade PT, DPT [place] [facility] [place] [facility] [address] [place], [place], [address] Phone: [phone]   Fax:  [phone]  Name: [name] MRN: [id number] Date of Birth: 11-16-1965

## 2018-09-25 MED FILL — ENBREL 50 MG/ML SOSY: 50 | 28 days supply | Qty: 4 | Fill #1

## 2018-09-27 ENCOUNTER — Other Ambulatory Visit: Payer: Self-pay

## 2018-09-27 ENCOUNTER — Encounter: Payer: Self-pay | Admitting: Physical Therapy

## 2018-09-27 ENCOUNTER — Ambulatory Visit: Payer: 59 | Admitting: Physical Therapy

## 2018-09-27 DIAGNOSIS — R29898 Other symptoms and signs involving the musculoskeletal system: Secondary | ICD-10-CM | POA: Diagnosis not present

## 2018-09-27 DIAGNOSIS — M542 Cervicalgia: Secondary | ICD-10-CM

## 2018-09-27 DIAGNOSIS — R2689 Other abnormalities of gait and mobility: Secondary | ICD-10-CM | POA: Diagnosis not present

## 2018-09-27 NOTE — Patient Instructions (Signed)
Updated lateral cervical flexion stretch to active assisted lateral cervical flexion x10 reps each direction  [facility] [address] [place], [place] [address] Phone # [phone] Fax [phone]

## 2018-09-27 NOTE — Therapy (Signed)
[facility] [address] [place], [place], [address] Phone: [phone]   Fax:  [phone]  Physical Therapy Treatment  Patient Details  Name: [name] MRN: [id number] Date of Birth: 05/16/1965 Referring Provider (PT): Eulas Post, MD   Encounter Date: 09/27/2018  PT End of Session - 09/27/18 1306    Visit Number  18    Date for PT Re-Evaluation  10/12/18    Authorization Type  UMR KX modifiers    PT Start Time  1115    PT Stop Time  1158    PT Time Calculation (min)  43 min    Activity Tolerance  Patient tolerated treatment well;No increased pain    Behavior During Therapy  WFL for tasks assessed/performed       Past Medical History:  Diagnosis Date  . History of avascular necrosis of capital femoral epiphysis 2006, 2007   Bilateral Femoral Head  . HLD (hyperlipidemia)   . Psoriatic arthritis [facility])     Past Surgical History:  Procedure Laterality Date  . JOINT REPLACEMENT Bilateral 2006, 2007   Necrosis femoral head (bilateral)  . TOTAL HIP REVISION Right 11/23/2016   Procedure: Acetabulum liner and femoral head revision;  Surgeon: [name], MD;  Location: [facility];  Service: Orthopedics;  Laterality: Right;    There were no vitals filed for this visit.  Subjective Assessment - 09/27/18 1117    Subjective  Pt states that his bands are harder but he is not having any issues.     Pertinent History  long time history of psoriatic arthritis, Bil THR, OA hands bil    Limitations  Sitting;Reading;Lifting    How long can you sit comfortably?  unlimited for neck    Patient Stated Goals  improve ROM, learn how to manage available range and maintain    Currently in Pain?  No/denies         [facility] PT Assessment - 09/27/18 0001      AROM   Cervical Extension  35    Cervical - Right Side Bend  20    Cervical - Left Side Bend  30                   [facility] Adult PT  Treatment/Exercise - 09/27/18 0001      Shoulder Exercises: Seated   External Rotation  Both;10 reps    External Rotation Limitations  x2 sets green TB    Diagonals  Strengthening;Both;15 reps    Diagonals Limitations  green TB D2 flexion      Manual Therapy   Joint Mobilization  Grade III-IV CPAs C3 to T2 x2 bouts; lateral cervical glides grade III-IV x1 bout mid and lower cervical spine; cervical extension MWM x10 reps C4 to C6    Muscle Energy Technique  Rt lateral flexion isometric contract relax             PT Education - 09/27/18 1305    Education Details  updated HEP and reviewed     Person(s) Educated  Patient    Methods  Explanation;Handout    Comprehension  Verbalized understanding;Returned demonstration       PT Short Term Goals - 09/13/18 1000      PT SHORT TERM GOAL #1   Title  Ind and compliant with initial HEP for frequent cervical ROM throughout day and postural adjustments during reading/computer time.    Time  3    Period  Weeks  Status  Achieved      PT SHORT TERM GOAL #2   Title  Pt will achieve at least 45 degrees bil neck rotation to improve functional activities such as turning head while driving.    Baseline  Rt 50 deg, Lt 40 deg    Time  4    Period  Weeks    Status  Partially Met      PT SHORT TERM GOAL #3   Title  Pt will be able to demonstrate improved seated cervical posture and strategy for elevating reading materials to reduce strain on neck.    Baseline  using lumbar roll     Time  3    Period  Weeks    Status  Achieved        PT Long Term Goals - 09/13/18 1000      PT LONG TERM GOAL #1   Title  Pt will be ind in advanced HEP.    Time  4    Period  Weeks    Status  Partially Met    Target Date  10/11/18      PT LONG TERM GOAL #2   Title  Pt will reduce FOTO to 35% demonstrating less limitation with functional activities.    Baseline  28%    Time  8    Period  Weeks    Status  Achieved      PT LONG TERM GOAL #3    Title  Pt will report plans to attend neighborhood gym or aquatic PT to maintain strength and ROM gains to allow greater ease of movement.    Time  8    Period  Weeks    Status  Partially Met      PT LONG TERM GOAL #4   Title  Pt will report at least 30% greater ease of head/neck movements throughout daily activities.    Baseline  pt unsure     Time  8    Period  Weeks    Status  Partially Met      PT LONG TERM GOAL #5   Title  Pt will have increase in lateral cervical flexion to atleast 30 deg which will assist with cervical rotation mechanics.     Baseline  20 deg Rt and 25 deg Lt     Time  6    Period  Weeks    Status  Partially Met            Plan - 09/27/18 1308    Clinical Impression Statement  Completed manual treatment to improve cervical flexibility. Pt was able to demonstrate 10 degrees of improvement in active assisted lateral cervical flexion in supine, however he does have difficulty maintaining this actively when in a dependent position due to muscle weakness. His HEP was updated to address this and will follow up next visit to determine if ROM improvements are maintained. No increase in cervical tightness or pain were noted following today's exercises and manual treatment.     PT Frequency  1x / week    PT Duration  4 weeks    PT Treatment/Interventions  ADLs/Self Care Home Management;Aquatic Therapy;Electrical Stimulation;Moist Heat;Traction;Functional mobility training;Therapeutic exercise;Neuromuscular re-education;Patient/family education;Manual techniques;Passive range of motion;Dry needling;Taping;Energy conservation;Spinal Manipulations;Joint Manipulations    PT Next Visit Plan  manual to improve extension and lateral flexion ROM focus with mobilization to cervical spine and stretches; f/u on HEP retraction with movement; update bands; seated thoracic rotation/strength; cervical extension and rotation MWM;  cervical retraction    PT Home Exercise Plan  [email or web address]      Consulted and Agree with Plan of Care  Patient       Patient will benefit from skilled therapeutic intervention in order to improve the following deficits and impairments:  Improper body mechanics, Decreased mobility, Postural dysfunction, Decreased activity tolerance, Decreased endurance, Decreased range of motion, Hypomobility, Impaired UE functional use  Visit Diagnosis: Cervicalgia  Other abnormalities of gait and mobility  Other symptoms and signs involving the musculoskeletal system     Problem List Patient Active Problem List   Diagnosis Date Noted  . Essential tremor 04/03/2017  . Elevated blood pressure reading 02/03/2017  . Failed total hip arthroplasty ([place]) 11/23/2016  . High risk medications (not anticoagulants) long-term use 09/20/2016  . Bilateral hand pain 09/20/2016  . DDD (degenerative disc disease), cervical 09/20/2016  . History of total hip replacement, bilateral 09/20/2016  . DDD (degenerative disc disease), lumbar 09/20/2016  . Bilateral plantar fasciitis 09/20/2016  . Psoriasis 09/20/2016  . Primary osteoarthritis of both feet 09/20/2016  . Primary osteoarthritis of both hands 09/20/2016  . Primary osteoarthritis of both knees 09/20/2016  . Prediabetes 09/22/2014  . Hyperlipidemia 09/22/2014  . URI (upper respiratory infection) 07/07/2012  . Acute bronchitis 07/07/2012  . HYPERLIPIDEMIA 07/03/2008  . OBESITY 07/03/2008  . AVASCULAR NECROSIS, FEMORAL HEAD 07/03/2008  . Psoriatic arthritis ([place]) 05/02/2001    Sherol Dade 09/27/2018, 1:39 PM  [facility] [address], [place], [place], [address] Phone: [phone]   Fax:  [phone]  Name: [name] MRN: [id number] Date of Birth: 1966-02-27

## 2018-10-05 ENCOUNTER — Ambulatory Visit: Payer: 59 | Attending: Family Medicine | Admitting: Physical Therapy

## 2018-10-05 ENCOUNTER — Other Ambulatory Visit: Payer: Self-pay

## 2018-10-05 ENCOUNTER — Encounter: Payer: Self-pay | Admitting: Physical Therapy

## 2018-10-05 DIAGNOSIS — M542 Cervicalgia: Secondary | ICD-10-CM | POA: Diagnosis not present

## 2018-10-05 DIAGNOSIS — R2689 Other abnormalities of gait and mobility: Secondary | ICD-10-CM | POA: Insufficient documentation

## 2018-10-05 DIAGNOSIS — R29898 Other symptoms and signs involving the musculoskeletal system: Secondary | ICD-10-CM | POA: Insufficient documentation

## 2018-10-05 NOTE — Therapy (Signed)
[facility] [address] [place], [place], [address] Phone: [phone]   Fax:  [phone]  Physical Therapy Treatment  Patient Details  Name: [name] MRN: [id number] Date of Birth: 01/04/1966 Referring Provider (PT): Eulas Post, MD   Encounter Date: 10/05/2018  PT End of Session - 10/05/18 1049    Visit Number  19    Date for PT Re-Evaluation  10/12/18    Authorization Type  UMR KX modifiers    PT Start Time  1003    PT Stop Time  1045    PT Time Calculation (min)  42 min    Activity Tolerance  Patient tolerated treatment well;No increased pain    Behavior During Therapy  WFL for tasks assessed/performed       Past Medical History:  Diagnosis Date  . History of avascular necrosis of capital femoral epiphysis 2006, 2007   Bilateral Femoral Head  . HLD (hyperlipidemia)   . Psoriatic arthritis [facility])     Past Surgical History:  Procedure Laterality Date  . JOINT REPLACEMENT Bilateral 2006, 2007   Necrosis femoral head (bilateral)  . TOTAL HIP REVISION Right 11/23/2016   Procedure: Acetabulum liner and femoral head revision;  Surgeon: [name], MD;  Location: [facility];  Service: Orthopedics;  Laterality: Right;    There were no vitals filed for this visit.  Subjective Assessment - 10/05/18 1005    Subjective  Pt states that he went hiking with his family over the weekend, but his low back started bothering him and his neck/shoulders got stiff. He was able to put heat on it and this made it better.     Pertinent History  long time history of psoriatic arthritis, Bil THR, OA hands bil    Limitations  Sitting;Reading;Lifting    How long can you sit comfortably?  unlimited for neck    Patient Stated Goals  improve ROM, learn how to manage available range and maintain    Currently in Pain?  No/denies         [facility] PT Assessment - 10/05/18 0001      AROM   Cervical Extension  35    Cervical - Right Side Bend  20    Cervical - Left Side Bend  30    Cervical - Right Rotation  50    Cervical - Left Rotation  40   45 deg following manual                  [facility] Adult PT Treatment/Exercise - 10/05/18 0001      Neck Exercises: Supine   Lateral Flexion  Right;Left;5 reps    Lateral Flexion Limitations  active assisted to improve upper trap activation      Manual Therapy   Joint Mobilization  Grade III-IV CPAs C3 to T2 x2 bouts, Rt cervical CPAs C23 to C6 x1 bout; lateral cervical glides grade III-IV x1 bout mid and lower cervical spine; cervical Lt rotation mobilization with movement at C2 3x10 reps, Cervical Rt lateral flexion MWM x10 reps C4 to C6    Muscle Energy Technique  Rt lateral flexion isometric contract relax             PT Education - 10/05/18 1049    Education Details  technique with therex    Person(s) Educated  Patient    Methods  Explanation    Comprehension  Verbalized understanding       PT Short Term  Goals - 09/13/18 1000      PT SHORT TERM GOAL #1   Title  Ind and compliant with initial HEP for frequent cervical ROM throughout day and postural adjustments during reading/computer time.    Time  3    Period  Weeks    Status  Achieved      PT SHORT TERM GOAL #2   Title  Pt will achieve at least 45 degrees bil neck rotation to improve functional activities such as turning head while driving.    Baseline  Rt 50 deg, Lt 40 deg    Time  4    Period  Weeks    Status  Partially Met      PT SHORT TERM GOAL #3   Title  Pt will be able to demonstrate improved seated cervical posture and strategy for elevating reading materials to reduce strain on neck.    Baseline  using lumbar roll     Time  3    Period  Weeks    Status  Achieved        PT Long Term Goals - 09/13/18 1000      PT LONG TERM GOAL #1   Title  Pt will be ind in advanced HEP.    Time  4    Period  Weeks    Status  Partially Met    Target Date  10/11/18       PT LONG TERM GOAL #2   Title  Pt will reduce FOTO to 35% demonstrating less limitation with functional activities.    Baseline  28%    Time  8    Period  Weeks    Status  Achieved      PT LONG TERM GOAL #3   Title  Pt will report plans to attend neighborhood gym or aquatic PT to maintain strength and ROM gains to allow greater ease of movement.    Time  8    Period  Weeks    Status  Partially Met      PT LONG TERM GOAL #4   Title  Pt will report at least 30% greater ease of head/neck movements throughout daily activities.    Baseline  pt unsure     Time  8    Period  Weeks    Status  Partially Met      PT LONG TERM GOAL #5   Title  Pt will have increase in lateral cervical flexion to atleast 30 deg which will assist with cervical rotation mechanics.     Baseline  20 deg Rt and 25 deg Lt     Time  6    Period  Weeks    Status  Partially Met            Plan - 10/05/18 1050    Clinical Impression Statement  Pt is doing well with his HEP. He was able to demonstrate maintained cervical active ROM, except for Lt lateral flexion over the week. Focused on manual treatment to increase lateral flexion, however this remains limited despite therapist efforts. Pt does demonstrate heavy sternocleidomastoid activation with Lt lateral flexion, requiring heavy cues to increase upper trap involvement. Pt's HEP was updated to encourage upper trap strength and control with lateral flexion. Lateral flexion was also increased atleast 5 deg end of session. Will plan for likely d/c next session due to pt's growing ability to maintain cervical ROM improvements since his evaluation.     PT Frequency  1x / week    PT Duration  4 weeks    PT Treatment/Interventions  ADLs/Self Care Home Management;Aquatic Therapy;Electrical Stimulation;Moist Heat;Traction;Functional mobility training;Therapeutic exercise;Neuromuscular re-education;Patient/family education;Manual techniques;Passive range of motion;Dry  needling;Taping;Energy conservation;Spinal Manipulations;Joint Manipulations    PT Next Visit Plan  d/c and update HEP (upper trap strength in sidelying?) manual if needed    PT Home Exercise Plan  [email or web address]     Consulted and Agree with Plan of Care  Patient       Patient will benefit from skilled therapeutic intervention in order to improve the following deficits and impairments:  Improper body mechanics, Decreased mobility, Postural dysfunction, Decreased activity tolerance, Decreased endurance, Decreased range of motion, Hypomobility, Impaired UE functional use  Visit Diagnosis: Cervicalgia  Other abnormalities of gait and mobility  Other symptoms and signs involving the musculoskeletal system     Problem List Patient Active Problem List   Diagnosis Date Noted  . Essential tremor 04/03/2017  . Elevated blood pressure reading 02/03/2017  . Failed total hip arthroplasty ([place]) 11/23/2016  . High risk medications (not anticoagulants) long-term use 09/20/2016  . Bilateral hand pain 09/20/2016  . DDD (degenerative disc disease), cervical 09/20/2016  . History of total hip replacement, bilateral 09/20/2016  . DDD (degenerative disc disease), lumbar 09/20/2016  . Bilateral plantar fasciitis 09/20/2016  . Psoriasis 09/20/2016  . Primary osteoarthritis of both feet 09/20/2016  . Primary osteoarthritis of both hands 09/20/2016  . Primary osteoarthritis of both knees 09/20/2016  . Prediabetes 09/22/2014  . Hyperlipidemia 09/22/2014  . URI (upper respiratory infection) 07/07/2012  . Acute bronchitis 07/07/2012  . HYPERLIPIDEMIA 07/03/2008  . OBESITY 07/03/2008  . AVASCULAR NECROSIS, FEMORAL HEAD 07/03/2008  . Psoriatic arthritis ([place]) 05/02/2001    10:56 AM,10/05/18 Sherol Dade PT, DPT [facility] [address] [place], [place], [address] Phone:  [phone]   Fax:  [phone]  Name: [name] MRN: [id number] Date of Birth: 11-Sep-1965

## 2018-10-12 ENCOUNTER — Encounter: Payer: Self-pay | Admitting: Physical Therapy

## 2018-10-12 ENCOUNTER — Ambulatory Visit: Payer: 59 | Admitting: Physical Therapy

## 2018-10-12 ENCOUNTER — Other Ambulatory Visit: Payer: Self-pay

## 2018-10-12 DIAGNOSIS — M542 Cervicalgia: Secondary | ICD-10-CM | POA: Diagnosis not present

## 2018-10-12 DIAGNOSIS — R29898 Other symptoms and signs involving the musculoskeletal system: Secondary | ICD-10-CM

## 2018-10-12 DIAGNOSIS — R2689 Other abnormalities of gait and mobility: Secondary | ICD-10-CM

## 2018-10-12 NOTE — Patient Instructions (Signed)
Access Code: [email or web address]  URL: [email or web address]  Date: 10/12/2018  Prepared by: Sherol Dade   Exercises  Seated Cervical Retraction and Rotation - 10 reps - 1-2 sets - 2x daily - 7x weekly  Cervical Retraction with Overpressure - 10 reps - 1 sets - 2x daily - 7x weekly  Seated Trunk Rotation Stretch - 10 reps - 1 sets - 2x daily - 7x weekly  Supine Cervical Sidebending - 10 reps - 3 sets - 2x daily - 7x weekly  Sidelying Cervical Sidebending - 10 reps - 2x daily - 7x weekly  Doorway Pec Stretch at 60 Elevation - 5 reps - 15 hold - 1x daily - 7x weekly   Access Code: [email or web address]  URL: [email or web address]  Date: 10/12/2018  Prepared by: Sherol Dade   Exercises  Standing Row with Anchored Resistance - 15-20 reps - 2 sets - 1x daily - 3-4x weekly  Seated Shoulder Horizontal Abduction with Resistance - 15 reps - 2 sets - 1x daily - 3-4x weekly  Standing Isometric Cervical Extension and Bilateral Shoulder Flexion with Ball at [facility] - 15-20 reps - 2 sets - 1x daily - 3-4x weekly     [facility] [address], [place], [place] [address] Phone # [phone] Fax [phone]

## 2018-10-12 NOTE — Therapy (Signed)
[facility] [address], [place], [place], [address] Phone: [phone]   Fax:  [phone]  Physical Therapy Treatment/Discharge  Patient Details  Name: [name] MRN: [id number] Date of Birth: 1965-08-04 Referring Provider (PT): Eulas Post, MD   Encounter Date: 10/12/2018  PT End of Session - 10/12/18 1045    Visit Number  20    Date for PT Re-Evaluation  10/12/18    Authorization Type  [facility] KX modifiers    PT Start Time  1002    PT Stop Time  1045    PT Time Calculation (min)  43 min    Activity Tolerance  Patient tolerated treatment well;No increased pain    Behavior During Therapy  WFL for tasks assessed/performed       Past Medical History:  Diagnosis Date  . History of avascular necrosis of capital femoral epiphysis 2006, 2007   Bilateral Femoral Head  . HLD (hyperlipidemia)   . Psoriatic arthritis [facility])     Past Surgical History:  Procedure Laterality Date  . JOINT REPLACEMENT Bilateral 2006, 2007   Necrosis femoral head (bilateral)  . TOTAL HIP REVISION Right 11/23/2016   Procedure: Acetabulum liner and femoral head revision;  Surgeon: [name], MD;  Location: [facility];  Service: Orthopedics;  Laterality: Right;    There were no vitals filed for this visit.  Subjective Assessment - 10/12/18 1004    Subjective  Pt states that he has been working on his HEP. He thinks it is helpful some and keeps it loose. No pain currently.    Pertinent History  long time history of psoriatic arthritis, Bil THR, OA hands bil    Limitations  Sitting;Reading;Lifting    How long can you sit comfortably?  unlimited for neck    Patient Stated Goals  improve ROM, learn how to manage available range and maintain         [facility] PT Assessment - 10/12/18 0001      Assessment   Medical Diagnosis  M43.6 (ICD-10-CM) - Neck stiffness    Referring Provider (PT)  Burchette, [name], MD    Onset Date/Surgical  Date  --   when Pt was in mid-20s   Hand Dominance  Right    Next MD Visit  no   sees rheumatologist in 1.5 mos   Prior Therapy  for lumbar      Precautions   Precautions  None   bil THR     Restrictions   Weight Bearing Restrictions  No      [place] residence    Living Arrangements  Spouse/significant other    Type of [place] to enter    Entrance Stairs-Number of Steps  1    Entrance Stairs-Rails  None    Home Layout  Two level    Alternate Level Stairs-Number of Steps  10    Alternate Level Stairs-Rails  Right   Pt reports no difficulty with stairs   Additional Comments  uses sock assister for donning socks, uses a reacher sometimes      Prior Function   Level of Independence  Independent    Vocation  On disability    Leisure  reading, family time, walk dogs      Cognition   Overall Cognitive Status  Within Functional Limits for tasks assessed      Observation/Other Assessments  Observations  Pt holds head in Lt lateral tilt    Focus on Therapeutic Outcomes (FOTO)   28%   goal 35%     Sensation   Light Touch  Appears Intact      Posture/Postural Control   Posture/Postural Control  Postural limitations    Posture Comments  within normal limits, slight forward head noted but not significant       AROM   Overall AROM   Deficits    Overall AROM Comments  bil shoulders WFL with exception of bil IR to low lumbar only    Cervical Flexion  40    Cervical Extension  35    Cervical - Right Side Bend  20    Cervical - Left Side Bend  30    Cervical - Right Rotation  50    Cervical - Left Rotation  40      Strength   Overall Strength  Within functional limits for tasks performed    Overall Strength Comments  grossly 5/5 throughout cervical and UEs      Palpation   Spinal mobility  global stiffness throughout cervical spine, most limitation noted: poor joint mobility Rt O/A, Rt A/A, all U-joint  side glides on Rt    Palpation comment  non tender with palpation      Special Tests   Other special tests  --      Transfers   Comments  --      Ambulation/Gait   Gait Comments  pt is walking 10,000+ steps every day and denies increased cervical stiffness following this                    [facility] Adult PT Treatment/Exercise - 10/12/18 0001      Exercises   Other Exercises   therapist discussed HEP and reps, sets moving forward       Neck Exercises: Sidelying   Lateral Flexion  Right;10 reps    Lateral Flexion Limitations  HEP demo       Shoulder Exercises: Standing   Flexion  10 reps    Theraband Level (Shoulder Flexion)  Level 1 (Yellow)    Flexion Limitations  neck retraction into ball on wall       Neck Exercises: Stretches   Other Neck Stretches  cervical extension with overpressure x5 reps, cervical retraction with rotation x2 reps HEP demo             PT Education - 10/12/18 1045    Education Details  updated and reviewed HEP; importance of continuing with HEP    Person(s) Educated  Patient    Methods  Explanation;Handout    Comprehension  Verbalized understanding;Returned demonstration       PT Short Term Goals - 10/12/18 1014      PT SHORT TERM GOAL #1   Title  Ind and compliant with initial HEP for frequent cervical ROM throughout day and postural adjustments during reading/computer time.    Time  3    Period  Weeks    Status  Achieved      PT SHORT TERM GOAL #2   Title  Pt will achieve at least 45 degrees bil neck rotation to improve functional activities such as turning head while driving.    Baseline  Rt 50 deg, Lt 45 deg    Time  4    Period  Weeks    Status  Achieved      PT SHORT TERM GOAL #  3   Title  Pt will be able to demonstrate improved seated cervical posture and strategy for elevating reading materials to reduce strain on neck.    Baseline  using lumbar roll and pt's wife has reported improvements with this.    Time  3     Period  Weeks    Status  Achieved        PT Long Term Goals - 10/12/18 1015      PT LONG TERM GOAL #1   Title  Pt will be ind in advanced HEP.    Time  4    Period  Weeks    Status  Achieved      PT LONG TERM GOAL #2   Title  Pt will reduce FOTO to 35% demonstrating less limitation with functional activities.    Baseline  28%    Time  8    Period  Weeks    Status  Achieved      PT LONG TERM GOAL #3   Title  Pt will report plans to attend neighborhood gym or aquatic PT to maintain strength and ROM gains to allow greater ease of movement.    Time  8    Period  Weeks    Status  Partially Met      PT LONG TERM GOAL #4   Title  Pt will report at least 30% greater ease of head/neck movements throughout daily activities.    Baseline  pt unsure     Time  8    Period  Weeks    Status  Partially Met      PT LONG TERM GOAL #5   Title  Pt will have increase in lateral cervical flexion to atleast 30 deg which will assist with cervical rotation mechanics.     Baseline  20 deg Rt and 35 deg Lt    Time  6    Period  Weeks    Status  Partially Met            Plan - 10/12/18 1045    Clinical Impression Statement  Pt was discharged this visit having met all but one of his short and long term goals. He demonstrates atlteast 10 deg of improvement in cervical rotation and nearly 20 deg of improvement with cervical extension. His FOTO improved to 28% at last assessment which is below the expected 35% improvement. Pt's posture at home and during his sessions has greatly improved, as he requires minimal cues for correction. He denies cervical pain throughout the day and finds that his advanced HEP provides good relief from tightness accumulated during the day. He is pleased with his progress and plans to continue working on his HEP moving forward.    PT Frequency  1x / week    PT Duration  4 weeks    PT Treatment/Interventions  ADLs/Self Care Home Management;Aquatic Therapy;Electrical  Stimulation;Moist Heat;Traction;Functional mobility training;Therapeutic exercise;Neuromuscular re-education;Patient/family education;Manual techniques;Passive range of motion;Dry needling;Taping;Energy conservation;Spinal Manipulations;Joint Manipulations    PT Next Visit Plan  d/c    PT Home Exercise Plan  [id number]     Consulted and Agree with Plan of Care  Patient       Patient will benefit from skilled therapeutic intervention in order to improve the following deficits and impairments:  Improper body mechanics, Decreased mobility, Postural dysfunction, Decreased activity tolerance, Decreased endurance, Decreased range of motion, Hypomobility, Impaired UE functional use  Visit Diagnosis: Cervicalgia - Plan:   Other abnormalities of gait  and mobility - Plan:   Other symptoms and signs involving the musculoskeletal system - Plan:      Problem List Patient Active Problem List   Diagnosis Date Noted  . Essential tremor 04/03/2017  . Elevated blood pressure reading 02/03/2017  . Failed total hip arthroplasty ([place]) 11/23/2016  . High risk medications (not anticoagulants) long-term use 09/20/2016  . Bilateral hand pain 09/20/2016  . DDD (degenerative disc disease), cervical 09/20/2016  . History of total hip replacement, bilateral 09/20/2016  . DDD (degenerative disc disease), lumbar 09/20/2016  . Bilateral plantar fasciitis 09/20/2016  . Psoriasis 09/20/2016  . Primary osteoarthritis of both feet 09/20/2016  . Primary osteoarthritis of both hands 09/20/2016  . Primary osteoarthritis of both knees 09/20/2016  . Prediabetes 09/22/2014  . Hyperlipidemia 09/22/2014  . URI (upper respiratory infection) 07/07/2012  . Acute bronchitis 07/07/2012  . HYPERLIPIDEMIA 07/03/2008  . OBESITY 07/03/2008  . AVASCULAR NECROSIS, FEMORAL HEAD 07/03/2008  . Psoriatic arthritis ([place]) 05/02/2001     PHYSICAL THERAPY DISCHARGE SUMMARY  Visits from Start of Care: 20  Current functional level  related to goals / functional outcomes: See above for more details    Remaining deficits: See above for more details    Education / Equipment: See above for more details   Plan: Patient agrees to discharge.  Patient goals were met. Patient is being discharged due to meeting the stated rehab goals.  ?????      10:54 AM,10/12/18 Sherol Dade PT, [place] [facility] [address] [place], [place], [address] Phone: [phone]   Fax:  [phone]  Name: [name] MRN: [id number] Date of Birth: [birth year]

## 2018-10-23 MED FILL — ENBREL 50 MG/ML SOSY: 50 | 28 days supply | Qty: 4 | Fill #2

## 2018-10-29 ENCOUNTER — Other Ambulatory Visit: Payer: Self-pay

## 2018-10-29 DIAGNOSIS — Z79899 Other long term (current) drug therapy: Secondary | ICD-10-CM

## 2018-10-30 LAB — COMPLETE METABOLIC PANEL WITH GFR
AG Ratio: 1.7 (calc) (ref 1.0–2.5)
ALT: 30 U/L (ref 9–46)
AST: 25 U/L (ref 10–35)
Albumin: 4.4 g/dL (ref 3.6–5.1)
Alkaline phosphatase (APISO): 62 U/L (ref 35–144)
BUN: 22 mg/dL (ref 7–25)
CO2: 26 mmol/L (ref 20–32)
Calcium: 9.6 mg/dL (ref 8.6–10.3)
Chloride: 105 mmol/L (ref 98–110)
Creat: 1.14 mg/dL (ref 0.70–1.33)
GFR, Est African American: 83 mL/min/{1.73_m2} (ref >=60)
GFR, Est Non African American: 72 mL/min/{1.73_m2} (ref >=60)
Globulin: 2.6 g/dL (calc) (ref 1.9–3.7)
Glucose, Bld: 89 mg/dL (ref 65–99)
Potassium: 4.2 mmol/L (ref 3.5–5.3)
Sodium: 140 mmol/L (ref 135–146)
Total Bilirubin: 0.8 mg/dL (ref 0.2–1.2)
Total Protein: 7 g/dL (ref 6.1–8.1)

## 2018-10-30 LAB — CBC WITH DIFFERENTIAL/PLATELET
Absolute Monocytes: 310 cells/uL (ref 200–950)
Basophils Absolute: 31 cells/uL (ref 0–200)
Basophils Relative: 0.5 %
Eosinophils Absolute: 62 cells/uL (ref 15–500)
Eosinophils Relative: 1 %
HCT: 47.6 % (ref 38.5–50.0)
Hemoglobin: 16.3 g/dL (ref 13.2–17.1)
Lymphs Abs: 2127 cells/uL (ref 850–3900)
MCH: 32.5 pg (ref 27.0–33.0)
MCHC: 34.2 g/dL (ref 32.0–36.0)
MCV: 95 fL (ref 80.0–100.0)
MPV: 11.3 fL (ref 7.5–12.5)
Monocytes Relative: 5 %
Neutro Abs: 3670 cells/uL (ref 1500–7800)
Neutrophils Relative %: 59.2 %
Platelets: 130 10*3/uL — ABNORMAL LOW (ref 140–400)
RBC: 5.01 10*6/uL (ref 4.20–5.80)
RDW: 13.1 % (ref 11.0–15.0)
Total Lymphocyte: 34.3 %
WBC: 6.2 10*3/uL (ref 3.8–10.8)

## 2018-11-08 NOTE — Progress Notes (Signed)
Office Visit Note  Patient: [name]             Date of Birth: [birth year]           MRN: [id number]             PCP: Eulas Post, MD Referring: Eulas Post, MD Visit Date: 11/20/2018 Occupation: @GUAROCC @  Subjective:  Joint stiffness    History of Present Illness: [name] is a 53 y.o. male history of psoriatic arthritis and osteoarthritis.  He states he continues to have some stiffness in his joints but no joint swelling.  He states he does well during the summertime usually.  Has been taking Enbrel on regular basis without any side effects.  He still has difficulty with complete fist formation and some stiffness.  He has some knee joint is stiffness.  He reports some pedal edema but no joint swelling.  Activities of Daily Living:  Patient reports morning stiffness for several minutes.   Patient Denies nocturnal pain.  Difficulty dressing/grooming: Denies Difficulty climbing stairs: Denies Difficulty getting out of chair: Denies Difficulty using hands for taps, buttons, cutlery, and/or writing: Denies  Review of Systems  Constitutional: Positive for fatigue. Negative for night sweats.  HENT: Negative for mouth sores, mouth dryness and nose dryness.   Eyes: Negative for pain, redness, itching and dryness.  Respiratory: Negative for shortness of breath, wheezing and difficulty breathing.   Cardiovascular: Negative for chest pain, palpitations, hypertension, irregular heartbeat and swelling in legs/feet.  Gastrointestinal: Negative for abdominal pain, constipation and diarrhea.  Endocrine: Negative for increased urination.  Genitourinary: Negative for painful urination and pelvic pain.  Musculoskeletal: Positive for arthralgias, joint pain and morning stiffness. Negative for joint swelling, myalgias, muscle weakness, muscle tenderness and myalgias.  Skin: Negative for color change, rash, hair loss, nodules/bumps, redness, skin tightness, ulcers and  sensitivity to sunlight.  Allergic/Immunologic: Negative for susceptible to infections.  Neurological: Negative for dizziness, fainting, light-headedness, headaches, memory loss, night sweats and weakness.  Hematological: Negative for bruising/bleeding tendency and swollen glands.  Psychiatric/Behavioral: Negative for depressed mood, confusion and sleep disturbance. The patient is not nervous/anxious.     PMFS History:  Patient Active Problem List   Diagnosis Date Noted   Essential tremor 04/03/2017   Elevated blood pressure reading 02/03/2017   Failed total hip arthroplasty ([place]) 11/23/2016   High risk medications (not anticoagulants) long-term use 09/20/2016   Bilateral hand pain 09/20/2016   DDD (degenerative disc disease), cervical 09/20/2016   History of total hip replacement, bilateral 09/20/2016   DDD (degenerative disc disease), lumbar 09/20/2016   Bilateral plantar fasciitis 09/20/2016   Psoriasis 09/20/2016   Primary osteoarthritis of both feet 09/20/2016   Primary osteoarthritis of both hands 09/20/2016   Primary osteoarthritis of both knees 09/20/2016   Prediabetes 09/22/2014   Hyperlipidemia 09/22/2014   URI (upper respiratory infection) 07/07/2012   Acute bronchitis 07/07/2012   HYPERLIPIDEMIA 07/03/2008   OBESITY 07/03/2008   AVASCULAR NECROSIS, FEMORAL HEAD 07/03/2008   Psoriatic arthritis ([place]) 05/02/2001    Past Medical History:  Diagnosis Date   History of avascular necrosis of capital femoral epiphysis 2006, 2007   Bilateral Femoral Head   HLD (hyperlipidemia)    Psoriatic arthritis ([place])     Family History  Problem Relation Age of Onset   Pneumonia Father    Tremor Neg Hx    Past Surgical History:  Procedure Laterality Date   JOINT REPLACEMENT Bilateral 2006, 2007   Necrosis  femoral head (bilateral)   TOTAL HIP REVISION Right 11/23/2016   Procedure: Acetabulum liner and femoral head revision;  Surgeon: [name],  MD;  Location: [facility];  Service: Orthopedics;  Laterality: Right;   Social History   Social History Narrative   Lives at home w/ his wife   Right-handed   Caffeine: 2-3 cups per day   Immunization History  Administered Date(s) Administered   Influenza,inj,Quad PF,6+ Mos 02/03/2017, 03/21/2018   Pneumococcal Conjugate-13 03/21/2018   Tdap 09/22/2014   Zoster Recombinat (Shingrix) 04/02/2018, 05/23/2018     Objective: Vital Signs: BP (!) 164/87 (BP Location: Left Arm, Patient Position: Sitting, Cuff Size: Large)    Pulse (!) 54    Resp 15    Ht 5\' 10"  (1.778 m)    Wt 238 lb (108 kg)    BMI 34.15 kg/m    Physical Exam Vitals signs and nursing note reviewed.  Constitutional:      Appearance: He is well-developed.  HENT:     Head: Normocephalic and atraumatic.  Eyes:     Conjunctiva/sclera: Conjunctivae normal.     Pupils: Pupils are equal, round, and reactive to light.  Neck:     Musculoskeletal: Normal range of motion and neck supple.  Cardiovascular:     Rate and Rhythm: Normal rate and regular rhythm.     Heart sounds: Normal heart sounds.  Pulmonary:     Effort: Pulmonary effort is normal.     Breath sounds: Normal breath sounds.  Abdominal:     General: Bowel sounds are normal.     Palpations: Abdomen is soft.  Skin:    General: Skin is warm and dry.     Capillary Refill: Capillary refill takes less than 2 seconds.  Neurological:     Mental Status: He is alert and oriented to person, place, and time.  Psychiatric:        Behavior: Behavior normal.      Musculoskeletal Exam: He has limited range of motion of his cervical spine.  Shoulder joints with good range of motion.  He has bilateral elbow joint contractures with no synovitis.  He has limited range of motion of bilateral wrist joints.  He has thickening of MCPs and PIPs.  He has incomplete fist formation bilaterally with no synovitis.  Hip joints are in good range of motion.  Knee joints with good range of  motion.  He has bilateral pedal edema and Schamberg's purpura.  CDAI Exam: CDAI Score: 0.4  Patient Global: 2 mm; Provider Global: 2 mm Swollen: 0 ; Tender: 0  Joint Exam   No joint exam has been documented for this visit   There is currently no information documented on the homunculus. Go to the Rheumatology activity and complete the homunculus joint exam.  Investigation: No additional findings.  Imaging: No results found.  Recent Labs: Lab Results  Component Value Date   WBC 6.2 10/29/2018   HGB 16.3 10/29/2018   PLT 130 (L) 10/29/2018   NA 140 10/29/2018   K 4.2 10/29/2018   CL 105 10/29/2018   CO2 26 10/29/2018   GLUCOSE 89 10/29/2018   BUN 22 10/29/2018   CREATININE 1.14 10/29/2018   BILITOT 0.8 10/29/2018   ALKPHOS 98 12/27/2016   AST 25 10/29/2018   ALT 30 10/29/2018   PROT 7.0 10/29/2018   ALBUMIN 4.4 12/27/2016   CALCIUM 9.6 10/29/2018   GFRAA 83 10/29/2018   QFTBGOLDPLUS NEGATIVE 02/08/2018    Speciality Comments: No specialty comments  available.  Procedures:  No procedures performed Allergies: Gold-containing drug products   Assessment / Plan:     Visit Diagnoses: Psoriatic arthritis ([place]) -patient had no synovitis on examination today.  He does have contractures and synovial thickening in multiple joints.  Psoriasis -he had no active psoriasis lesions.  High risk medication use -   Enbrel 50 mg every 7 days.  Last TB gold negative on 02/08/2018 and will monitor yearly.  Most recent CBC/CMP within normal limits except for low platelets but stable on 10/29/2018.  Due for CBC/CMP in late September and will monitor every 3 months.  Standing orders are in place.  He received the flu vaccine in November and previously Prevnar 13 and Shingrix vaccines.  Recommend Pneumovax 23 as indicated for immunosuppressant therapy.  Recommend yearly skin exams due to increased risk of melanoma. - Plan: QuantiFERON-TB Gold Plus,  Primary osteoarthritis of both hands  -joint protection muscle strengthening was discussed.  History of total hip replacement, bilateral - 11/23/2016 Revision of right hip by Dr. Maureen Ralphs.   Primary osteoarthritis of both knees -he has been walking about 10,000 steps daily.  Which is been helpful.  Primary osteoarthritis of both feet -chronic discomfort which is manageable.  DDD (degenerative disc disease), cervical -he has limited range of motion of the cervical spine.  DDD (degenerative disc disease), lumbar -limited range of motion.  Other fatigue -it is not very significant currently.  History of vitamin D deficiency -he takes supplement.  History of hypertension -blood pressure was elevated today.  He has been advised to monitor blood pressure closely and follow-up with his PCP.  History of hyperlipidemia -his LDL has been elevated.  Association of heart disease with psoriatic arthritis was discussed.  Need for regular exercise and monitoring diet was also discussed.  History of prediabetes   Orders: Orders Placed This Encounter  Procedures   QuantiFERON-TB Gold Plus   No orders of the defined types were placed in this encounter.   .  Follow-Up Instructions: Return in about 5 months (around 04/22/2019) for Psoriatic arthritis, Osteoarthritis.   Bo Merino, MD  Note - This record has been created using Editor, commissioning.  Chart creation errors have been sought, but may not always  have been located. Such creation errors do not reflect on  the standard of medical care.

## 2018-11-14 ENCOUNTER — Other Ambulatory Visit: Payer: Self-pay | Admitting: Rheumatology

## 2018-11-14 ENCOUNTER — Other Ambulatory Visit: Payer: Self-pay | Admitting: Internal Medicine

## 2018-11-14 NOTE — Telephone Encounter (Signed)
Last Visit: 06/22/2018 Next Visit: 11/21/2018 Labs: 10/29/18 CMP WNL. Platelets are low but stable. TB Gold: 02/08/2018 neg  Okay to refill per Dr. Estanislado Pandy.

## 2018-11-20 ENCOUNTER — Encounter: Payer: Self-pay | Admitting: Rheumatology

## 2018-11-20 ENCOUNTER — Ambulatory Visit (INDEPENDENT_AMBULATORY_CARE_PROVIDER_SITE_OTHER): Payer: 59 | Admitting: Rheumatology

## 2018-11-20 ENCOUNTER — Other Ambulatory Visit: Payer: Self-pay

## 2018-11-20 VITALS — BP 164/87 | HR 54 | Resp 15 | Ht 70.0 in | Wt 238.0 lb

## 2018-11-20 DIAGNOSIS — Z96643 Presence of artificial hip joint, bilateral: Secondary | ICD-10-CM | POA: Diagnosis not present

## 2018-11-20 DIAGNOSIS — M19041 Primary osteoarthritis, right hand: Secondary | ICD-10-CM | POA: Diagnosis not present

## 2018-11-20 DIAGNOSIS — M5136 Other intervertebral disc degeneration, lumbar region: Secondary | ICD-10-CM | POA: Diagnosis not present

## 2018-11-20 DIAGNOSIS — L409 Psoriasis, unspecified: Secondary | ICD-10-CM | POA: Diagnosis not present

## 2018-11-20 DIAGNOSIS — Z79899 Other long term (current) drug therapy: Secondary | ICD-10-CM

## 2018-11-20 DIAGNOSIS — M19072 Primary osteoarthritis, left ankle and foot: Secondary | ICD-10-CM

## 2018-11-20 DIAGNOSIS — M51369 Other intervertebral disc degeneration, lumbar region without mention of lumbar back pain or lower extremity pain: Secondary | ICD-10-CM

## 2018-11-20 DIAGNOSIS — M170 Bilateral primary osteoarthritis of knee: Secondary | ICD-10-CM

## 2018-11-20 DIAGNOSIS — M19071 Primary osteoarthritis, right ankle and foot: Secondary | ICD-10-CM

## 2018-11-20 DIAGNOSIS — R5383 Other fatigue: Secondary | ICD-10-CM

## 2018-11-20 DIAGNOSIS — Z8639 Personal history of other endocrine, nutritional and metabolic disease: Secondary | ICD-10-CM

## 2018-11-20 DIAGNOSIS — L4050 Arthropathic psoriasis, unspecified: Secondary | ICD-10-CM

## 2018-11-20 DIAGNOSIS — Z87898 Personal history of other specified conditions: Secondary | ICD-10-CM

## 2018-11-20 DIAGNOSIS — Z8679 Personal history of other diseases of the circulatory system: Secondary | ICD-10-CM

## 2018-11-20 DIAGNOSIS — M5030 Other cervical disc degeneration, unspecified cervical region: Secondary | ICD-10-CM

## 2018-11-20 DIAGNOSIS — M19042 Primary osteoarthritis, left hand: Secondary | ICD-10-CM

## 2018-11-20 NOTE — Patient Instructions (Signed)
Standing Labs We placed an order today for your standing lab work.    Please come back and get your standing labs in September and every 3 months TB gold with your next lab  We have open lab daily Monday through Thursday from 8:30-12:30 PM and 1:30-4:30 PM and Friday from 8:30-12:30 PM and 1:30 -4:00 PM at the office of Dr. Bo Merino.   You may experience shorter wait times on Monday and Friday afternoons. The office is located at [address] [place], [place], [place] [address] No appointment is necessary.   Labs are drawn by [facility].  You may receive a bill from [facility] for your lab work.  If you wish to have your labs drawn at another location, please call the office 24 hours in advance to send orders.  If you have any questions regarding directions or hours of operation,  please call [phone].   Just as a reminder please drink plenty of water prior to coming for your lab work. Thanks!

## 2018-11-21 ENCOUNTER — Other Ambulatory Visit: Payer: Self-pay | Admitting: Pharmacist

## 2018-11-21 ENCOUNTER — Ambulatory Visit: Payer: Self-pay | Admitting: Rheumatology

## 2018-11-21 MED ORDER — ENBREL 50 MG/ML ~~LOC~~ SOSY: PREFILLED_SYRINGE | SUBCUTANEOUS | 0 refills | Status: DC

## 2018-11-21 MED FILL — ENBREL 50 MG/ML SOSY: 50 | 28 days supply | Qty: 4 | Fill #0

## 2018-12-03 ENCOUNTER — Ambulatory Visit (INDEPENDENT_AMBULATORY_CARE_PROVIDER_SITE_OTHER): Payer: 59 | Admitting: Family Medicine

## 2018-12-03 ENCOUNTER — Encounter: Payer: Self-pay | Admitting: Family Medicine

## 2018-12-03 ENCOUNTER — Other Ambulatory Visit: Payer: Self-pay

## 2018-12-03 VITALS — BP 128/78 | HR 63 | Temp 97.6°F | Ht 68.0 in | Wt 244.3 lb

## 2018-12-03 DIAGNOSIS — R194 Change in bowel habit: Secondary | ICD-10-CM | POA: Diagnosis not present

## 2018-12-03 NOTE — Patient Instructions (Signed)

## 2018-12-03 NOTE — Progress Notes (Signed)
Subjective:     Patient ID: [name], male   DOB: 08-04-1965, 53 y.o.   MRN: [id number]  HPI Patient states that his rectal area "feels different ".  He has difficulty putting into specific words but denies any pain.  On further questioning, he has had some change in bowel habits.  He states that he normally has 1 stool per morning and recently has had 2 to 3/day and smaller caliber.  No blood.  No watery stools.  No appetite or weight changes.  He states he has been having to strain a little bit more recently.  He has not noted any dietary changes.  No abdominal pain.  He had recent hemoglobin which is stable at 16.3 per rheumatology back at end of June.  He had Cologuard which was -November 2018.  No family history of colon cancer.  Past Medical History:  Diagnosis Date  . History of avascular necrosis of capital femoral epiphysis 2006, 2007   Bilateral Femoral Head  . HLD (hyperlipidemia)   . Psoriatic arthritis [facility])    Past Surgical History:  Procedure Laterality Date  . JOINT REPLACEMENT Bilateral 2006, 2007   Necrosis femoral head (bilateral)  . TOTAL HIP REVISION Right 11/23/2016   Procedure: Acetabulum liner and femoral head revision;  Surgeon: [name], MD;  [facility]: [facility];  Service: Orthopedics;  Laterality: Right;    reports that he has never smoked. He has never used smokeless tobacco. He reports current alcohol use. He reports that he does not use drugs. family history includes Pneumonia in his father. Allergies  Allergen Reactions  . Gold-Containing Drug Products Rash    Denies oral or airway involvement - occurred in the 1980s.        Review of Systems  Constitutional: Negative for appetite change and unexpected weight change.  Respiratory: Negative for shortness of breath.   Cardiovascular: Negative for chest pain.  Gastrointestinal: Negative for abdominal distention, abdominal pain, blood in stool, diarrhea, nausea and vomiting.       Objective:    Physical Exam Constitutional:      Appearance: Normal appearance.  Cardiovascular:     Rate and Rhythm: Normal rate and regular rhythm.  Pulmonary:     Effort: Pulmonary effort is normal.     Breath sounds: Normal breath sounds.  Genitourinary:    Comments: Patient has nonthrombosed external hemorrhoid relative 3 o'clock position.  Digital exam reveals no masses.  Prostate is normal in size with no nodules.  No rectal nodules or masses noted Neurological:     Mental Status: He is alert.        Assessment:     Change in bowel habits.  Patient has had increased number of stools but lower caliber.  He denies any red flags such as bloody stools, weight loss, etc.  Recent Cologuard as above    Plan:     -We strongly recommend he consider GI referral for consideration for colonoscopy but he declines at this time.  We explained that change in caliber of stools at his age is 1 red flag for consideration for further evaluation.  He prefers at least 2-week trial of increased fiber intake and focus on good fluids.  If symptoms not fully resolving with that be in touch We discussed limitations of Cologuard screening including risk of false negatives and he understands risk of not pursuing further evaluation of colonoscopy at this time  Eulas Post MD [facility]

## 2018-12-24 DIAGNOSIS — Z96641 Presence of right artificial hip joint: Secondary | ICD-10-CM | POA: Diagnosis not present

## 2018-12-24 MED FILL — ENBREL 50 MG/ML SOSY: 50 | 28 days supply | Qty: 4 | Fill #1

## 2019-01-04 ENCOUNTER — Other Ambulatory Visit: Payer: Self-pay

## 2019-01-04 DIAGNOSIS — Z79899 Other long term (current) drug therapy: Secondary | ICD-10-CM

## 2019-01-06 LAB — COMPLETE METABOLIC PANEL WITH GFR
AG Ratio: 1.7 (calc) (ref 1.0–2.5)
ALT: 32 U/L (ref 9–46)
AST: 23 U/L (ref 10–35)
Albumin: 4.6 g/dL (ref 3.6–5.1)
Alkaline phosphatase (APISO): 72 U/L (ref 35–144)
BUN: 22 mg/dL (ref 7–25)
CO2: 24 mmol/L (ref 20–32)
Calcium: 10.1 mg/dL (ref 8.6–10.3)
Chloride: 105 mmol/L (ref 98–110)
Creat: 1.16 mg/dL (ref 0.70–1.33)
GFR, Est African American: 82 mL/min/{1.73_m2} (ref >=60)
GFR, Est Non African American: 70 mL/min/{1.73_m2} (ref >=60)
Globulin: 2.7 g/dL (calc) (ref 1.9–3.7)
Glucose, Bld: 106 mg/dL — ABNORMAL HIGH (ref 65–99)
Potassium: 4.5 mmol/L (ref 3.5–5.3)
Sodium: 139 mmol/L (ref 135–146)
Total Bilirubin: 0.7 mg/dL (ref 0.2–1.2)
Total Protein: 7.3 g/dL (ref 6.1–8.1)

## 2019-01-06 LAB — CBC WITH DIFFERENTIAL/PLATELET
Absolute Monocytes: 281 cells/uL (ref 200–950)
Basophils Absolute: 18 cells/uL (ref 0–200)
Basophils Relative: 0.3 %
Eosinophils Absolute: 79 cells/uL (ref 15–500)
Eosinophils Relative: 1.3 %
HCT: 49.6 % (ref 38.5–50.0)
Hemoglobin: 17.1 g/dL (ref 13.2–17.1)
Lymphs Abs: 1946 cells/uL (ref 850–3900)
MCH: 32.8 pg (ref 27.0–33.0)
MCHC: 34.5 g/dL (ref 32.0–36.0)
MCV: 95.2 fL (ref 80.0–100.0)
MPV: 11.1 fL (ref 7.5–12.5)
Monocytes Relative: 4.6 %
Neutro Abs: 3776 cells/uL (ref 1500–7800)
Neutrophils Relative %: 61.9 %
Platelets: 134 10*3/uL — ABNORMAL LOW (ref 140–400)
RBC: 5.21 10*6/uL (ref 4.20–5.80)
RDW: 12.9 % (ref 11.0–15.0)
Total Lymphocyte: 31.9 %
WBC: 6.1 10*3/uL (ref 3.8–10.8)

## 2019-01-06 LAB — QUANTIFERON-TB GOLD PLUS
Mitogen-NIL: >10 IU/mL
NIL: 0.01 IU/mL
QuantiFERON-TB Gold Plus: NEGATIVE
TB1-NIL: 0.01 IU/mL
TB2-NIL: 0.01 IU/mL

## 2019-01-08 NOTE — Progress Notes (Signed)
Platelets are low but is stable.  Rest the labs are within normal limits.

## 2019-01-21 MED FILL — ENBREL 50 MG/ML SOSY: 50 | 28 days supply | Qty: 4 | Fill #2

## 2019-02-07 ENCOUNTER — Other Ambulatory Visit: Payer: Self-pay | Admitting: Internal Medicine

## 2019-02-07 ENCOUNTER — Other Ambulatory Visit: Payer: Self-pay | Admitting: Rheumatology

## 2019-02-08 ENCOUNTER — Other Ambulatory Visit: Payer: Self-pay | Admitting: Pharmacist

## 2019-02-08 MED ORDER — ENBREL 50 MG/ML ~~LOC~~ SOSY: PREFILLED_SYRINGE | SUBCUTANEOUS | 0 refills | Status: DC

## 2019-02-08 NOTE — Telephone Encounter (Signed)
Last Visit: 11/20/18 Next Visit: 04/23/19 Labs: 01/04/19 Platelets are low but is stable. Rest the labs are within normal limits TB Gold: 01/04/19 Neg    Okay to refill per Dr. Estanislado Pandy

## 2019-02-12 MED FILL — ENBREL 50 MG/ML SOSY: 50 | 28 days supply | Qty: 4 | Fill #0

## 2019-02-19 ENCOUNTER — Telehealth (INDEPENDENT_AMBULATORY_CARE_PROVIDER_SITE_OTHER): Payer: 59 | Admitting: Family Medicine

## 2019-02-19 ENCOUNTER — Other Ambulatory Visit: Payer: Self-pay

## 2019-02-19 DIAGNOSIS — R05 Cough: Secondary | ICD-10-CM

## 2019-02-19 DIAGNOSIS — R059 Cough, unspecified: Secondary | ICD-10-CM

## 2019-02-19 DIAGNOSIS — R0981 Nasal congestion: Secondary | ICD-10-CM

## 2019-02-19 NOTE — Progress Notes (Signed)
This visit type was conducted due to national recommendations for restrictions regarding the COVID-19 pandemic in an effort to limit this patient's exposure and mitigate transmission in our community.   Virtual Visit via Video Note  I connected with [name] on 02/19/19 at  3:30 PM EDT by a video enabled telemedicine application and verified that I am speaking with the correct person using two identifiers.  Location patient: home Location provider:work or home office Persons participating in the virtual visit: patient, provider  I discussed the limitations of evaluation and management by telemedicine and the availability of in person appointments. The patient expressed understanding and agreed to proceed.   HPI: [name] called with approximately 1 week history of some cough, sinus pressure, postnasal drip.  He took a Xyzal for a couple days and that seemed to help the cough.  He has not had any headaches.  No purulent secretions.  No dyspnea.  No fever.  No diarrhea.  No loss of taste or smell.  Non-smoker.  No chronic lung issues.  He also tried some over-the-counter Mucinex.  No sick contacts.  Wife is asymptomatic.   ROS: See pertinent positives and negatives per HPI.  Past Medical History:  Diagnosis Date  . History of avascular necrosis of capital femoral epiphysis 2006, 2007   Bilateral Femoral Head  . HLD (hyperlipidemia)   . Psoriatic arthritis [facility])     Past Surgical History:  Procedure Laterality Date  . JOINT REPLACEMENT Bilateral 2006, 2007   Necrosis femoral head (bilateral)  . TOTAL HIP REVISION Right 11/23/2016   Procedure: Acetabulum liner and femoral head revision;  Surgeon: [name], MD;  Location: [facility];  Service: Orthopedics;  Laterality: Right;    Family History  Problem Relation Age of Onset  . Pneumonia Father   . Tremor Neg Hx     SOCIAL HX: Non-smoker   Current Outpatient Medications:  .  acetaminophen (TYLENOL) 500 MG tablet, Take  500-1,000 mg by mouth 3 (three) times daily as needed for mild pain. , Disp: , Rfl:  .  calcium-vitamin D (OSCAL WITH D) 500-200 MG-UNIT tablet, Take 1 tablet by mouth., Disp: , Rfl:  .  cholecalciferol (VITAMIN D) 1000 units tablet, Take 2,000 Units by mouth daily. , Disp: , Rfl:  .  co-enzyme Q-10 30 MG capsule, Take 30 mg by mouth daily., Disp: , Rfl:  .  etanercept (ENBREL) 50 MG/ML injection, Inject 1 syringe into the skin once a week, Disp: 12 mL, Rfl: 0 .  ibuprofen (ADVIL,MOTRIN) 200 MG tablet, Take 200 mg by mouth every 8 (eight) hours as needed., Disp: , Rfl:  .  Magnesium 250 MG TABS, Take by mouth daily., Disp: , Rfl:  .  Melatonin 5 MG CAPS, Take by mouth., Disp: , Rfl:  .  TURMERIC PO, Take by mouth., Disp: , Rfl:  .  vitamin B-12 (CYANOCOBALAMIN) 100 MCG tablet, Take 100 mcg by mouth daily., Disp: , Rfl:  .  Vitamin D, Ergocalciferol, (DRISDOL) 50000 units CAPS capsule, Take 50,000 Units by mouth daily., Disp: , Rfl: 0  EXAM:  VITALS per patient if applicable:  GENERAL: alert, oriented, appears well and in no acute distress  HEENT: atraumatic, conjunttiva clear, no obvious abnormalities on inspection of external nose and ears  NECK: normal movements of the head and neck  LUNGS: on inspection no signs of respiratory distress, breathing rate appears normal, no obvious gross SOB, gasping or wheezing  CV: no obvious cyanosis  MS: moves all visible  extremities without noticeable abnormality  PSYCH/NEURO: pleasant and cooperative, no obvious depression or anxiety, speech and thought processing grossly intact  ASSESSMENT AND PLAN:  Discussed the following assessment and plan:  URI symptoms of cough and nasal congestion with postnasal drip.  Suspect viral.  -We discussed possible Covid testing and gave him option of various drive up sites -Continue Xyzal and recommend consider over-the-counter Flonase daily -Follow-up for any fever, increased shortness of breath, or other  concerns -Recommended consideration for at least 10-day quarantine if he is not tested     I discussed the assessment and treatment plan with the patient. The patient was provided an opportunity to ask questions and all were answered. The patient agreed with the plan and demonstrated an understanding of the instructions.   The patient was advised to call back or seek an in-person evaluation if the symptoms worsen or if the condition fails to improve as anticipated.    Carolann Littler, MD

## 2019-02-26 ENCOUNTER — Ambulatory Visit (INDEPENDENT_AMBULATORY_CARE_PROVIDER_SITE_OTHER): Payer: 59 | Admitting: Family Medicine

## 2019-02-26 ENCOUNTER — Encounter: Payer: Self-pay | Admitting: Family Medicine

## 2019-02-26 ENCOUNTER — Other Ambulatory Visit: Payer: Self-pay

## 2019-02-26 VITALS — BP 130/82 | HR 72 | Temp 98.0°F

## 2019-02-26 DIAGNOSIS — M79604 Pain in right leg: Secondary | ICD-10-CM | POA: Diagnosis not present

## 2019-02-26 NOTE — Progress Notes (Signed)
  Subjective:     Patient ID: [name], male   DOB: Apr 08, 1966, 53 y.o.   MRN: [id number]  HPI [name] is seen with very transient right lateral leg pain last night around 1:30 AM when he got up to go to the bathroom.  His recent history is that he was down in [place] celebrating anniversary with his wife and they had to leave early.  His mother-in-law ended up dying of complications of Covid infection yesterday.  They drove back all day.  He got up at 1:30 AM to go the bathroom and noted sudden sharp pain right lateral leg and this was very transient.  He did not recall any muscle cramp.  His pain has not recurred since then.  He has not noted any significant foot or ankle edema.  No medial calf pain.  No low back pain.  No numbness or weakness.  No history of similar pain previously.  Past Medical History:  Diagnosis Date  . History of avascular necrosis of capital femoral epiphysis 2006, 2007   Bilateral Femoral Head  . HLD (hyperlipidemia)   . Psoriatic arthritis [facility])    Past Surgical History:  Procedure Laterality Date  . JOINT REPLACEMENT Bilateral 2006, 2007   Necrosis femoral head (bilateral)  . TOTAL HIP REVISION Right 11/23/2016   Procedure: Acetabulum liner and femoral head revision;  Surgeon: [name], MD;  Location: [facility];  Service: Orthopedics;  Laterality: Right;    reports that he has never smoked. He has never used smokeless tobacco. He reports current alcohol use. He reports that he does not use drugs. family history includes Pneumonia in his father. Allergies  Allergen Reactions  . Gold-Containing Drug Products Rash    Denies oral or airway involvement - occurred in the 1980s.      Review of Systems  Constitutional: Negative for chills and fever.  Respiratory: Negative for shortness of breath.   Cardiovascular: Negative for chest pain and leg swelling.  Musculoskeletal: Negative for back pain.  Neurological: Negative for weakness and numbness.        Objective:   Physical Exam Vitals signs reviewed.  Constitutional:      Appearance: Normal appearance.  Cardiovascular:     Rate and Rhythm: Normal rate and regular rhythm.  Pulmonary:     Effort: Pulmonary effort is normal.     Breath sounds: Normal breath sounds.  Musculoskeletal:     Right lower leg: No edema.     Left lower leg: No edema.     Comments: Distal foot pulses are normal bilaterally with good capillary refill.  Both feet are warm to touch.  He has no calf tenderness on the right.  No rashes.  No visible edema.  No pain with dorsiflexion or plantarflexion on the right.  Straight leg raise negative.  Neurological:     Mental Status: He is alert.     Comments: Full strength lower extremities        Assessment:     Very transient right lateral leg pain early this morning.  Resolved at this time.  Clinically, low suspicion for DVT.  He has not had any medial pain, significant swelling, or other clinical indicators of DVT    Plan:     -Follow-up for any swelling, recurrent pain, or any new symptoms  Joshua Post MD [facility]'

## 2019-02-26 NOTE — Patient Instructions (Signed)
Follow up for any recurrent pain, swelling, or other concerns.

## 2019-03-11 ENCOUNTER — Telehealth: Payer: Self-pay | Admitting: Family Medicine

## 2019-03-11 MED ORDER — BENZONATATE 100 MG PO CAPS: ORAL_CAPSULE | ORAL | 0 refills | Status: DC

## 2019-03-11 MED FILL — BENZONATATE 100 MG CAPS: 100 | 7 days supply | Qty: 40 | Fill #0

## 2019-03-11 NOTE — Telephone Encounter (Signed)
Patient called back for [name] can be reached at Ph# [phone]

## 2019-03-11 NOTE — Addendum Note (Signed)
Addended by: Benson Setting L on: 03/11/2019 02:42 PM   Modules accepted: Orders

## 2019-03-11 NOTE — Telephone Encounter (Signed)
Pt states that he had a video visit about a nagging cough and it has gotten no better.  Pt would like to know if the doctor has any other suggestions for him.

## 2019-03-11 NOTE — Telephone Encounter (Signed)
Spoke with pt and informed him of Dr. Erick Blinks message. Pt agreed to try medication for cough. Rx was sent to his pharmacy of choice. Nothing further is needed

## 2019-03-11 NOTE — Telephone Encounter (Signed)
Attempted to reach pt, left vm to call back

## 2019-03-11 NOTE — Telephone Encounter (Signed)
Recommend Tessalon Perles 100 mg 1-2 po q 8 hours prn cough #40 with 0 refills.  Recommend touch base if cough not better in one week.

## 2019-03-14 MED FILL — ENBREL 50 MG/ML SOSY: 50 | 28 days supply | Qty: 4 | Fill #1

## 2019-03-15 ENCOUNTER — Other Ambulatory Visit: Payer: Self-pay

## 2019-03-15 DIAGNOSIS — Z20822 Contact with and (suspected) exposure to covid-19: Secondary | ICD-10-CM

## 2019-03-16 DIAGNOSIS — R05 Cough: Secondary | ICD-10-CM | POA: Diagnosis not present

## 2019-03-18 LAB — NOVEL CORONAVIRUS, NAA: SARS-CoV-2, NAA: NOT DETECTED

## 2019-03-20 DIAGNOSIS — M25552 Pain in left hip: Secondary | ICD-10-CM | POA: Diagnosis not present

## 2019-03-20 DIAGNOSIS — T84091D Other mechanical complication of internal left hip prosthesis, subsequent encounter: Secondary | ICD-10-CM | POA: Diagnosis not present

## 2019-04-04 NOTE — Patient Instructions (Signed)
DUE TO COVID-19 ONLY ONE VISITOR IS ALLOWED TO COME WITH YOU AND STAY IN THE WAITING ROOM ONLY DURING PRE OP AND PROCEDURE DAY OF SURGERY. THE 1 VISITOR MAY VISIT WITH YOU AFTER SURGERY IN YOUR PRIVATE ROOM DURING VISITING HOURS ONLY!  YOU NEED TO HAVE A COVID 19 TEST ON___SATURDAY 12-5____ @_______ , THIS TEST MUST BE DONE BEFORE SURGERY, COME  [address], [place] , [address].  ([place]) ONCE YOUR COVID TEST IS COMPLETED, PLEASE BEGIN THE QUARANTINE INSTRUCTIONS AS OUTLINED IN YOUR HANDOUT.                [place]    Your procedure is scheduled on: 12-9   Report to [place]  Entrance   Report to admitting at 11:15AM     Call this number if you have problems the morning of surgery [phone]    NO SOLID FOOD AFTER MIDNIGHT THE NIGHT PRIOR TO SURGERY. YOU MAY DRINK CLEAR LIQUIDS.   STOP CLEAR LIQUIDS AT ____10:45AM_____ AND THEN DRINK THE ENSURE PRE-SURGERY [place]. NOTHING BY MOUTH AFTER THE ENSURE DRINK!    CLEAR LIQUID DIET   Foods Allowed                                                                     Foods Excluded  Coffee and tea, regular and decaf                             liquids that you cannot  Plain Jell-O any favor except red or purple                                           see through such as: Fruit ices (not with fruit pulp)                                     milk, soups, orange juice  Iced Popsicles                                    All solid food Carbonated beverages, regular and diet                                    Cranberry, grape and apple juices Sports drinks like Gatorade Lightly seasoned clear broth or consume(fat free) Sugar, honey syrup  Sample Menu Breakfast                                Lunch                                     Supper Cranberry juice  Beef broth                            Chicken broth Jell-O                                     Grape juice                            Apple juice Coffee or tea                        Jell-O                                      Popsicle                                                Coffee or tea                        Coffee or tea  _____________________________________________________________________   BRUSH YOUR TEETH MORNING OF SURGERY AND RINSE YOUR MOUTH OUT, NO CHEWING GUM CANDY OR MINTS.     Take these medicines the morning of surgery with A SIP OF WATER: TYLENOL IF NEEDED, LEVOCETERIZINE (XYZAL), OMEPRAZOLE(PRILOSEC)                                  You may not have any metal on your body including hair pins and              piercings  Do not wear jewelry, make-up, lotions, powders or perfumes, deodorant                         Men may shave face and neck.   Do not bring valuables to the hospital. [place].  Contacts, dentures or bridgework may not be worn into surgery.  YOU MAY BRING A SMALL OVERNIGHT BAG              Please read over the following fact sheets you were given: _____________________________________________________________________             [facility] - Preparing for Surgery Before surgery, you can play an important role.  Because skin is not sterile, your skin needs to be as free of germs as possible.  You can reduce the number of germs on your skin by washing with CHG (chlorahexidine gluconate) soap before surgery.  CHG is an antiseptic cleaner which kills germs and bonds with the skin to continue killing germs even after washing. Please DO NOT use if you have an allergy to CHG or antibacterial soaps.  If your skin becomes reddened/irritated stop using the CHG and inform your nurse when you arrive at Short Stay. Do not shave (including legs and underarms) for at least 48 hours prior to the first CHG shower.  You may shave your face/neck. Please  follow these instructions carefully:  1.  Shower with CHG Soap the night before  surgery and the  morning of Surgery.  2.  If you choose to wash your hair, wash your hair first as usual with your  normal  shampoo.  3.  After you shampoo, rinse your hair and body thoroughly to remove the  shampoo.                           4.  Use CHG as you would any other liquid soap.  You can apply chg directly  to the skin and wash                       Gently with a scrungie or clean washcloth.  5.  Apply the CHG Soap to your body ONLY FROM THE NECK DOWN.   Do not use on face/ open                           Wound or open sores. Avoid contact with eyes, ears mouth and genitals (private parts).                       Wash face,  Genitals (private parts) with your normal soap.             6.  Wash thoroughly, paying special attention to the area where your surgery  will be performed.  7.  Thoroughly rinse your body with warm water from the neck down.  8.  DO NOT shower/wash with your normal soap after using and rinsing off  the CHG Soap.                9.  Pat yourself dry with a clean towel.            10.  Wear clean pajamas.            11.  Place clean sheets on your bed the night of your first shower and do not  sleep with pets. Day of Surgery : Do not apply any lotions/deodorants the morning of surgery.  Please wear clean clothes to the hospital/surgery center.  FAILURE TO FOLLOW THESE INSTRUCTIONS MAY RESULT IN THE CANCELLATION OF YOUR SURGERY PATIENT SIGNATURE_________________________________  NURSE SIGNATURE__________________________________  ________________________________________________________________________   [name]  An incentive spirometer is a tool that can help keep your lungs clear and active. This tool measures how well you are filling your lungs with each breath. Taking long deep breaths may help reverse or decrease the chance of developing breathing (pulmonary) problems (especially infection) following:  A long period of time when you are unable to  move or be active. BEFORE THE PROCEDURE   If the spirometer includes an indicator to show your best effort, your nurse or respiratory therapist will set it to a desired goal.  If possible, sit up straight or lean slightly forward. Try not to slouch.  Hold the incentive spirometer in an upright position. INSTRUCTIONS FOR USE  1. Sit on the edge of your bed if possible, or sit up as far as you can in bed or on a chair. 2. Hold the incentive spirometer in an upright position. 3. Breathe out normally. 4. Place the mouthpiece in your mouth and seal your lips tightly around it. 5. Breathe in slowly and as deeply as possible, raising the piston or the ball  toward the top of the column. 6. Hold your breath for 3-5 seconds or for as long as possible. Allow the piston or ball to fall to the bottom of the column. 7. Remove the mouthpiece from your mouth and breathe out normally. 8. Rest for a few seconds and repeat Steps 1 through 7 at least 10 times every 1-2 hours when you are awake. Take your time and take a few normal breaths between deep breaths. 9. The spirometer may include an indicator to show your best effort. Use the indicator as a goal to work toward during each repetition. 10. After each set of 10 deep breaths, practice coughing to be sure your lungs are clear. If you have an incision (the cut made at the time of surgery), support your incision when coughing by placing a pillow or rolled up towels firmly against it. Once you are able to get out of bed, walk around indoors and cough well. You may stop using the incentive spirometer when instructed by your caregiver.  RISKS AND COMPLICATIONS  Take your time so you do not get dizzy or light-headed.  If you are in pain, you may need to take or ask for pain medication before doing incentive spirometry. It is harder to take a deep breath if you are having pain. AFTER USE  Rest and breathe slowly and easily.  It can be helpful to keep track of  a log of your progress. Your caregiver can provide you with a simple table to help with this. If you are using the spirometer at home, follow these instructions: [place] IF:   You are having difficultly using the spirometer.  You have trouble using the spirometer as often as instructed.  Your pain medication is not giving enough relief while using the spirometer.  You develop fever of 100.5 F (38.1 C) or higher. SEEK IMMEDIATE MEDICAL CARE IF:   You cough up bloody sputum that had not been present before.  You develop fever of 102 F (38.9 C) or greater.  You develop worsening pain at or near the incision site. MAKE SURE YOU:   Understand these instructions.  Will watch your condition.  Will get help right away if you are not doing well or get worse. Document Released: 08/29/2006 Document Revised: 07/11/2011 Document Reviewed: 10/30/2006 Exit[employer] Patient Information 2014 ExitCare, [place].   ________________________________________________________________________  WHAT IS A BLOOD TRANSFUSION? Blood Transfusion Information  A transfusion is the replacement of blood or some of its parts. Blood is made up of multiple cells which provide different functions.  Red blood cells carry oxygen and are used for blood loss replacement.  White blood cells fight against infection.  Platelets control bleeding.  Plasma helps clot blood.  Other blood products are available for specialized needs, such as hemophilia or other clotting disorders. BEFORE THE TRANSFUSION  Who gives blood for transfusions?   Healthy volunteers who are fully evaluated to make sure their blood is safe. This is blood bank blood. Transfusion therapy is the safest it has ever been in the practice of medicine. Before blood is taken from a donor, a complete history is taken to make sure that person has no history of diseases nor engages in risky social behavior (examples are intravenous drug use or sexual  activity with multiple partners). The donor's travel history is screened to minimize risk of transmitting infections, such as malaria. The donated blood is tested for signs of infectious diseases, such as HIV and hepatitis. The blood is  then tested to be sure it is compatible with you in order to minimize the chance of a transfusion reaction. If you or a relative donates blood, this is often done in anticipation of surgery and is not appropriate for emergency situations. It takes many days to process the donated blood. RISKS AND COMPLICATIONS Although transfusion therapy is very safe and saves many lives, the main dangers of transfusion include:   Getting an infectious disease.  Developing a transfusion reaction. This is an allergic reaction to something in the blood you were given. Every precaution is taken to prevent this. The decision to have a blood transfusion has been considered carefully by your caregiver before blood is given. Blood is not given unless the benefits outweigh the risks. AFTER THE TRANSFUSION  Right after receiving a blood transfusion, you will usually feel much better and more energetic. This is especially true if your red blood cells have gotten low (anemic). The transfusion raises the level of the red blood cells which carry oxygen, and this usually causes an energy increase.  The nurse administering the transfusion will monitor you carefully for complications. HOME CARE INSTRUCTIONS  No special instructions are needed after a transfusion. You may find your energy is better. Speak with your caregiver about any limitations on activity for underlying diseases you may have. SEEK MEDICAL CARE IF:   Your condition is not improving after your transfusion.  You develop redness or irritation at the intravenous (IV) site. SEEK IMMEDIATE MEDICAL CARE IF:  Any of the following symptoms occur over the next 12 hours:  Shaking chills.  You have a temperature by mouth above 102 F  (38.9 C), not controlled by medicine.  Chest, back, or muscle pain.  People around you feel you are not acting correctly or are confused.  Shortness of breath or difficulty breathing.  Dizziness and fainting.  You get a rash or develop hives.  You have a decrease in urine output.  Your urine turns a dark color or changes to pink, red, or brown. Any of the following symptoms occur over the next 10 days:  You have a temperature by mouth above 102 F (38.9 C), not controlled by medicine.  Shortness of breath.  Weakness after normal activity.  The white part of the eye turns yellow (jaundice).  You have a decrease in the amount of urine or are urinating less often.  Your urine turns a dark color or changes to pink, red, or brown. Document Released: 04/15/2000 Document Revised: 07/11/2011 Document Reviewed: 12/03/2007 [facility] Patient Information 2014 [place], [place].  _______________________________________________________________________

## 2019-04-06 ENCOUNTER — Other Ambulatory Visit (HOSPITAL_COMMUNITY)
Admission: RE | Admit: 2019-04-06 | Discharge: 2019-04-06 | Disposition: A | Discharge status: Home / Self Care | Payer: 59 | Source: Ambulatory Visit | Attending: Orthopedic Surgery | Admitting: Orthopedic Surgery

## 2019-04-06 DIAGNOSIS — Z01812 Encounter for preprocedural laboratory examination: Secondary | ICD-10-CM | POA: Diagnosis not present

## 2019-04-06 DIAGNOSIS — Z20828 Contact with and (suspected) exposure to other viral communicable diseases: Secondary | ICD-10-CM | POA: Insufficient documentation

## 2019-04-08 LAB — NOVEL CORONAVIRUS, NAA (HOSP ORDER, SEND-OUT TO REF LAB; TAT 18-24 HRS): SARS-CoV-2, NAA: NOT DETECTED

## 2019-04-08 NOTE — H&P (Signed)
TOTAL HIP REVISION ADMISSION H&P  Patient is admitted for left revision total hip arthroplasty.  Subjective:  Chief Complaint: left hip pain  HPI: [name], 53 y.o. male, has a history of pain and functional disability in the left hip due to bearing surface wear and metallosis and patient has failed non-surgical conservative treatments for greater than 12 weeks to include NSAID's and/or analgesics, flexibility and strengthening excercises and activity modification. The indications for the revision total hip arthroplasty are bearing surface wear leading to  symptomatic synovitis.  Onset of symptoms was gradual starting 2 years ago with gradually worsening course since that time.  Prior procedures on the left hip include arthroplasty.  Patient currently rates pain in the left hip at 4 out of 10 with activity.  There is worsening of pain with activity and weight bearing and pain that interfers with activities of daily living. Patient has no evidence of prosthetic loosening by imaging studies.  This condition presents safety issues increasing the risk of falls.   There is no current active infection.  Patient Active Problem List   Diagnosis Date Noted  . Essential tremor 04/03/2017  . Elevated blood pressure reading 02/03/2017  . Failed total hip arthroplasty ([place]) 11/23/2016  . High risk medications (not anticoagulants) long-term use 09/20/2016  . Bilateral hand pain 09/20/2016  . DDD (degenerative disc disease), cervical 09/20/2016  . History of total hip replacement, bilateral 09/20/2016  . DDD (degenerative disc disease), lumbar 09/20/2016  . Bilateral plantar fasciitis 09/20/2016  . Psoriasis 09/20/2016  . Primary osteoarthritis of both feet 09/20/2016  . Primary osteoarthritis of both hands 09/20/2016  . Primary osteoarthritis of both knees 09/20/2016  . Prediabetes 09/22/2014  . Hyperlipidemia 09/22/2014  . URI (upper respiratory infection) 07/07/2012  . Acute bronchitis  07/07/2012  . HYPERLIPIDEMIA 07/03/2008  . OBESITY 07/03/2008  . AVASCULAR NECROSIS, FEMORAL HEAD 07/03/2008  . Psoriatic arthritis ([place]) 05/02/2001   Past Medical History:  Diagnosis Date  . History of avascular necrosis of capital femoral epiphysis 2006, 2007   Bilateral Femoral Head  . HLD (hyperlipidemia)   . Psoriatic arthritis [facility])     Past Surgical History:  Procedure Laterality Date  . JOINT REPLACEMENT Bilateral 2006, 2007   Necrosis femoral head (bilateral)  . TOTAL HIP REVISION Right 11/23/2016   Procedure: Acetabulum liner and femoral head revision;  Surgeon: [name], MD;  Location: [facility];  Service: Orthopedics;  Laterality: Right;   Current Outpatient Medications  Medication Sig Dispense Refill Last Dose  . acetaminophen (TYLENOL) 500 MG tablet Take 1,000 mg by mouth daily.      . cholecalciferol (VITAMIN D) 1000 units tablet Take 2,000 Units by mouth daily.      . Coenzyme Q10 (COQ10) 100 MG CAPS Take 100 mg by mouth daily. *liquid*     . etanercept (ENBREL) 50 MG/ML injection Inject 1 syringe into the skin once a week (Patient taking differently: Inject 50 mg into the skin every Thursday. ) 12 mL 0   . fluticasone (FLONASE) 50 MCG/ACT nasal spray Place 1 spray into both nostrils 2 (two) times daily.     [name] ibuprofen (ADVIL,MOTRIN) 200 MG tablet Take 200 mg by mouth every 8 (eight) hours as needed.     [name] levocetirizine (XYZAL) 5 MG tablet Take 5 mg by mouth daily.     . Magnesium Oxide (MAG-200) 200 MG TABS Take 200 mg by mouth daily.     . Omega-3 Fatty Acids (FISH OIL ULTRA) 1400  MG CAPS Take 1,400 mg by mouth daily.     [name] omeprazole (PRILOSEC) 20 MG capsule Take 20 mg by mouth daily.     . TURMERIC PO Take 1,000 mg by mouth daily. *Liquid-3 teaspoonful*     . benzonatate (TESSALON) 100 MG capsule Take 1-2 capsules by mouth every 8 hours as needed for cough (Patient not taking: Reported on 04/02/2019) 40 capsule 0 Not Taking at Unknown time  . Melatonin 5 MG  CAPS Take 5 mg by mouth at bedtime as needed (sleep.).       Allergies  Allergen Reactions  . Gold-Containing Drug Products Rash    Denies oral or airway involvement - occurred in the 1980s.     Social History   Tobacco Use  . Smoking status: Never Smoker  . Smokeless tobacco: Never Used  Substance Use Topics  . Alcohol use: Yes    Alcohol/week: 0.0 standard drinks    Comment: 3-4 drinks per week    Family History  Problem Relation Age of Onset  . Pneumonia Father   . Tremor Neg Hx       Review of Systems  Constitutional: Negative.   HENT: Negative.   Eyes: Negative.   Respiratory: Negative.   Cardiovascular: Negative.   Gastrointestinal: Negative.   Genitourinary: Negative.   Musculoskeletal: Positive for joint pain and myalgias. Negative for back pain, falls and neck pain.  Skin: Negative.   Neurological: Negative.   Endo/Heme/Allergies: Negative.   Psychiatric/Behavioral: Negative.     Objective:  Physical Exam  Constitutional: He is oriented to person, place, and time. He appears well-developed. No distress.  Obese  HENT:  Head: Normocephalic and atraumatic.  Eyes: EOM are normal.  Neck: Normal range of motion.  Cardiovascular: Normal rate, regular rhythm, normal heart sounds and intact distal pulses.  No murmur heard. Respiratory: Effort normal and breath sounds normal. No respiratory distress. He has no wheezes.  GI: He exhibits no distension. There is no abdominal tenderness.  Musculoskeletal:     Comments: No tenderness to palpation about the right greater trochanteric bursa. Mild tenderness to palpation about the left greater trochanteric bursa. No masses or tumors palpated about the hips. No pain with passive and active motion of the right and left hips. ROM right hip 140 degrees flexion, 30 degrees internal rotation, 40 degrees external rotation, and 40 degrees abduction. ROM left hip 140 degrees flexion, 30 degrees internal rotation, 40 degrees  external rotation, and 40 degrees abduction. Incisions healed with no signs of infection.  Neurological: He is alert and oriented to person, place, and time. He has normal strength. No sensory deficit.  Skin: No rash noted. He is not diaphoretic. No erythema.  Psychiatric: He has a normal mood and affect. His behavior is normal.    Vitals Ht: 5 ft 9 in Standing  Wt: 242 lbs With clothes  BMI: 35.7  BP: 164/88 sitting L arm  Pulse: 80 bpm   Imaging Review:  Plain radiographs demonstrate severe degenerative joint disease of the left hip(s). There is evidence of loosening of the acetabular cup and femoral stem.The bone quality appears to be good for age and reported activity level.     Assessment/Plan:  End stage primary osteoarthritis, left hip(s) with failed previous arthroplasty.  The patient history, physical examination, clinical judgement of the provider and imaging studies are consistent with end stage degenerative joint disease of the left hip(s), previous total hip arthroplasty. Revision total hip arthroplasty is deemed medically necessary. The  treatment options including medical management, injection therapy, arthroscopy and arthroplasty were discussed at length. The risks and benefits of total hip arthroplasty were presented and reviewed. The risks due to aseptic loosening, infection, stiffness, dislocation/subluxation,  thromboembolic complications and other imponderables were discussed.  The patient acknowledged the explanation, agreed to proceed with the plan and consent was signed. Patient is being admitted for inpatient treatment for surgery, pain control, PT, OT, prophylactic antibiotics, VTE prophylaxis, progressive ambulation and ADL's and discharge planning. The patient is planning to be discharged home.    Risks and benefits of the surgery were discussed with the patient and Dr.Aluisio at their previous office visit, and the patient has elected to move forward with the  aforementioned surgery. Post-operative care plans were discussed with the patient today.  Therapy Plans: HEP vs HHPT Disposition: Home with wife and friend Planned DVT prophylaxis: aspirin 325mg  BID DME needed: walker; 3-n-1 PCP: Dr. [name] Awaiting clearance  Other: no anesthesia concerns  Instructed patient on which meds to stop prior to surgery  [name], PA-C

## 2019-04-09 ENCOUNTER — Other Ambulatory Visit: Payer: Self-pay | Admitting: *Deleted

## 2019-04-09 ENCOUNTER — Encounter (HOSPITAL_COMMUNITY)
Admission: RE | Admit: 2019-04-09 | Discharge: 2019-04-09 | Disposition: A | Discharge status: Home / Self Care | Payer: 59 | Source: Ambulatory Visit | Attending: Orthopedic Surgery | Admitting: Orthopedic Surgery

## 2019-04-09 ENCOUNTER — Other Ambulatory Visit: Payer: Self-pay

## 2019-04-09 ENCOUNTER — Encounter (HOSPITAL_COMMUNITY): Payer: Self-pay

## 2019-04-09 DIAGNOSIS — M1612 Unilateral primary osteoarthritis, left hip: Secondary | ICD-10-CM | POA: Diagnosis not present

## 2019-04-09 DIAGNOSIS — M879 Osteonecrosis, unspecified: Secondary | ICD-10-CM | POA: Diagnosis not present

## 2019-04-09 DIAGNOSIS — E785 Hyperlipidemia, unspecified: Secondary | ICD-10-CM | POA: Diagnosis not present

## 2019-04-09 DIAGNOSIS — Z6834 Body mass index (BMI) 34.0-34.9, adult: Secondary | ICD-10-CM | POA: Diagnosis not present

## 2019-04-09 DIAGNOSIS — E669 Obesity, unspecified: Secondary | ICD-10-CM | POA: Diagnosis not present

## 2019-04-09 DIAGNOSIS — T84061A Wear of articular bearing surface of internal prosthetic left hip joint, initial encounter: Secondary | ICD-10-CM | POA: Diagnosis not present

## 2019-04-09 DIAGNOSIS — Z01818 Encounter for other preprocedural examination: Secondary | ICD-10-CM | POA: Insufficient documentation

## 2019-04-09 DIAGNOSIS — M170 Bilateral primary osteoarthritis of knee: Secondary | ICD-10-CM | POA: Diagnosis not present

## 2019-04-09 DIAGNOSIS — G250 Essential tremor: Secondary | ICD-10-CM | POA: Diagnosis not present

## 2019-04-09 DIAGNOSIS — T84091A Other mechanical complication of internal left hip prosthesis, initial encounter: Secondary | ICD-10-CM | POA: Insufficient documentation

## 2019-04-09 DIAGNOSIS — L4050 Arthropathic psoriasis, unspecified: Secondary | ICD-10-CM | POA: Diagnosis not present

## 2019-04-09 HISTORY — DX: Pneumonia, unspecified organism: J18.9

## 2019-04-09 HISTORY — DX: Anemia, unspecified: D64.9

## 2019-04-09 LAB — CBC WITH DIFFERENTIAL/PLATELET
Abs Immature Granulocytes: 0.01 10*3/uL (ref 0.00–0.07)
Basophils Absolute: 0 10*3/uL (ref 0.0–0.1)
Basophils Relative: 0 %
Eosinophils Absolute: 0.1 10*3/uL (ref 0.0–0.5)
Eosinophils Relative: 2 %
HCT: 48.7 % (ref 39.0–52.0)
Hemoglobin: 16.3 g/dL (ref 13.0–17.0)
Immature Granulocytes: 0 %
Lymphocytes Relative: 32 %
Lymphs Abs: 1.7 10*3/uL (ref 0.7–4.0)
MCH: 31.3 pg (ref 26.0–34.0)
MCHC: 33.5 g/dL (ref 30.0–36.0)
MCV: 93.5 fL (ref 80.0–100.0)
Monocytes Absolute: 0.3 10*3/uL (ref 0.1–1.0)
Monocytes Relative: 5 %
Neutro Abs: 3.3 10*3/uL (ref 1.7–7.7)
Neutrophils Relative %: 61 %
Platelets: 120 10*3/uL — ABNORMAL LOW (ref 150–400)
RBC: 5.21 MIL/uL (ref 4.22–5.81)
RDW: 12 % (ref 11.5–15.5)
WBC: 5.4 10*3/uL (ref 4.0–10.5)
nRBC: 0 % (ref 0.0–0.2)

## 2019-04-09 LAB — COMPREHENSIVE METABOLIC PANEL
ALT: 35 U/L (ref 0–44)
AST: 25 U/L (ref 15–41)
Albumin: 4.3 g/dL (ref 3.5–5.0)
Alkaline Phosphatase: 73 U/L (ref 38–126)
Anion gap: 10 (ref 5–15)
BUN: 22 mg/dL — ABNORMAL HIGH (ref 6–20)
CO2: 24 mmol/L (ref 22–32)
Calcium: 9.4 mg/dL (ref 8.9–10.3)
Chloride: 106 mmol/L (ref 98–111)
Creatinine, Ser: 0.98 mg/dL (ref 0.61–1.24)
GFR calc Af Amer: >60 mL/min (ref >60)
GFR calc non Af Amer: >60 mL/min (ref >60)
Glucose, Bld: 91 mg/dL (ref 70–99)
Potassium: 4.2 mmol/L (ref 3.5–5.1)
Sodium: 140 mmol/L (ref 135–145)
Total Bilirubin: 1.1 mg/dL (ref 0.3–1.2)
Total Protein: 7.5 g/dL (ref 6.5–8.1)

## 2019-04-09 LAB — PROTIME-INR
INR: 0.9 (ref 0.8–1.2)
Prothrombin Time: 12.2 seconds (ref 11.4–15.2)

## 2019-04-09 LAB — APTT: aPTT: 29 seconds (ref 24–36)

## 2019-04-09 LAB — SURGICAL PCR SCREEN
MRSA, PCR: NEGATIVE
Staphylococcus aureus: NEGATIVE

## 2019-04-09 LAB — ABO/RH: ABO/RH(D): O POS

## 2019-04-09 NOTE — Patient Outreach (Signed)
[facility]) Care Management  04/09/2019  [name] [birth year] [id number]  Preoperative Screening Call Referral received: 04/04/19 Surgery/Procedure date: 04/10/19 Initial outreach: 04/09/19 Insurance: [employer] [place]   Initial unsuccessful telephone call to patient's preferred number in order to complete  preoperative screening; no answer, left HIPAA compliant voicemail message requesting return call.   Objective: Per the electronic medical record, [name]  is scheduled for left bearing surface vs total hip arthroplasty revision on 04/10/19 at [facility] . He completed his pre-operative admission testing visit on 04/09/19  . Comorbidities include: Degenerative disc disease, lumbar, cervical, avascular necrosis femoral head, failed total hip arthroplasty, psoriatic arthritis  Plan:  If patient does not return call today, this RNCM will call patient for transition of care outreach within 72 hours of hospital discharge notification.   [name], RN, [place] Management Coordinator  [phone]- Mobile [phone]- Toll Free Main Office

## 2019-04-10 ENCOUNTER — Inpatient Hospital Stay (HOSPITAL_COMMUNITY)
Admission: RE | Admit: 2019-04-10 | Discharge: 2019-04-11 | DRG: 467 | Disposition: A | Discharge status: Home / Self Care | Payer: 59 | Attending: Orthopedic Surgery | Admitting: Orthopedic Surgery

## 2019-04-10 ENCOUNTER — Inpatient Hospital Stay (HOSPITAL_COMMUNITY): Payer: 59 | Admitting: Physician Assistant

## 2019-04-10 ENCOUNTER — Inpatient Hospital Stay (HOSPITAL_COMMUNITY): Payer: 59 | Admitting: Anesthesiology

## 2019-04-10 ENCOUNTER — Other Ambulatory Visit: Payer: Self-pay

## 2019-04-10 ENCOUNTER — Encounter (HOSPITAL_COMMUNITY): Payer: Self-pay | Admitting: Emergency Medicine

## 2019-04-10 ENCOUNTER — Inpatient Hospital Stay (HOSPITAL_COMMUNITY): Payer: 59

## 2019-04-10 ENCOUNTER — Encounter (HOSPITAL_COMMUNITY)
Admission: RE | Disposition: A | Discharge status: Home / Self Care | Payer: Self-pay | Source: Ambulatory Visit | Attending: Orthopedic Surgery

## 2019-04-10 DIAGNOSIS — M19071 Primary osteoarthritis, right ankle and foot: Secondary | ICD-10-CM | POA: Diagnosis present

## 2019-04-10 DIAGNOSIS — E669 Obesity, unspecified: Secondary | ICD-10-CM | POA: Diagnosis present

## 2019-04-10 DIAGNOSIS — L409 Psoriasis, unspecified: Secondary | ICD-10-CM | POA: Diagnosis present

## 2019-04-10 DIAGNOSIS — M659 Synovitis and tenosynovitis, unspecified: Secondary | ICD-10-CM | POA: Diagnosis present

## 2019-04-10 DIAGNOSIS — L4050 Arthropathic psoriasis, unspecified: Secondary | ICD-10-CM | POA: Diagnosis present

## 2019-04-10 DIAGNOSIS — M25552 Pain in left hip: Secondary | ICD-10-CM | POA: Diagnosis present

## 2019-04-10 DIAGNOSIS — M879 Osteonecrosis, unspecified: Secondary | ICD-10-CM | POA: Diagnosis present

## 2019-04-10 DIAGNOSIS — E785 Hyperlipidemia, unspecified: Secondary | ICD-10-CM | POA: Diagnosis not present

## 2019-04-10 DIAGNOSIS — M19041 Primary osteoarthritis, right hand: Secondary | ICD-10-CM | POA: Diagnosis present

## 2019-04-10 DIAGNOSIS — G250 Essential tremor: Secondary | ICD-10-CM | POA: Diagnosis present

## 2019-04-10 DIAGNOSIS — M1612 Unilateral primary osteoarthritis, left hip: Secondary | ICD-10-CM | POA: Diagnosis present

## 2019-04-10 DIAGNOSIS — D649 Anemia, unspecified: Secondary | ICD-10-CM | POA: Diagnosis not present

## 2019-04-10 DIAGNOSIS — M19072 Primary osteoarthritis, left ankle and foot: Secondary | ICD-10-CM | POA: Diagnosis present

## 2019-04-10 DIAGNOSIS — M19042 Primary osteoarthritis, left hand: Secondary | ICD-10-CM | POA: Diagnosis present

## 2019-04-10 DIAGNOSIS — Z79899 Other long term (current) drug therapy: Secondary | ICD-10-CM

## 2019-04-10 DIAGNOSIS — T84018A Broken internal joint prosthesis, other site, initial encounter: Secondary | ICD-10-CM

## 2019-04-10 DIAGNOSIS — M170 Bilateral primary osteoarthritis of knee: Secondary | ICD-10-CM | POA: Diagnosis present

## 2019-04-10 DIAGNOSIS — Y792 Prosthetic and other implants, materials and accessory orthopedic devices associated with adverse incidents: Secondary | ICD-10-CM | POA: Diagnosis present

## 2019-04-10 DIAGNOSIS — Z888 Allergy status to other drugs, medicaments and biological substances status: Secondary | ICD-10-CM | POA: Diagnosis not present

## 2019-04-10 DIAGNOSIS — Z6834 Body mass index (BMI) 34.0-34.9, adult: Secondary | ICD-10-CM

## 2019-04-10 DIAGNOSIS — J189 Pneumonia, unspecified organism: Secondary | ICD-10-CM | POA: Diagnosis not present

## 2019-04-10 DIAGNOSIS — Z96642 Presence of left artificial hip joint: Secondary | ICD-10-CM | POA: Diagnosis not present

## 2019-04-10 DIAGNOSIS — T84061A Wear of articular bearing surface of internal prosthetic left hip joint, initial encounter: Principal | ICD-10-CM | POA: Diagnosis present

## 2019-04-10 DIAGNOSIS — T84091A Other mechanical complication of internal left hip prosthesis, initial encounter: Secondary | ICD-10-CM | POA: Diagnosis not present

## 2019-04-10 DIAGNOSIS — Z471 Aftercare following joint replacement surgery: Secondary | ICD-10-CM | POA: Diagnosis not present

## 2019-04-10 DIAGNOSIS — Z96649 Presence of unspecified artificial hip joint: Secondary | ICD-10-CM

## 2019-04-10 HISTORY — PX: TOTAL HIP REVISION: SHX763

## 2019-04-10 LAB — TYPE AND SCREEN
ABO/RH(D): O POS
Antibody Screen: NEGATIVE

## 2019-04-10 SURGERY — TOTAL HIP REVISION
Anesthesia: Monitor Anesthesia Care | Site: Hip | Laterality: Left

## 2019-04-10 MED ORDER — ONDANSETRON HCL 4 MG/2ML IJ SOLN
INTRAMUSCULAR | Status: AC
  Filled 2019-04-10: qty 2

## 2019-04-10 MED ORDER — FENTANYL CITRATE (PF) 100 MCG/2ML IJ SOLN
INTRAMUSCULAR | Status: AC
  Filled 2019-04-10: qty 2

## 2019-04-10 MED ORDER — METOCLOPRAMIDE HCL 5 MG PO TABS: 5.0000 mg | ORAL_TABLET | Freq: Three times a day (TID) | ORAL | Status: DC | PRN

## 2019-04-10 MED ORDER — SODIUM CHLORIDE 0.9 % IV SOLN
INTRAVENOUS | Status: DC
  Administered 2019-04-10: 22:00:00 via INTRAVENOUS

## 2019-04-10 MED ORDER — TRAMADOL HCL 50 MG PO TABS: 50.0000 mg | ORAL_TABLET | Freq: Four times a day (QID) | ORAL | Status: DC | PRN

## 2019-04-10 MED ORDER — PHENYLEPHRINE HCL-NACL 10-0.9 MG/250ML-% IV SOLN
INTRAVENOUS | Status: DC | PRN
  Administered 2019-04-10: 40 ug/min via INTRAVENOUS

## 2019-04-10 MED ORDER — FENTANYL CITRATE (PF) 100 MCG/2ML IJ SOLN: 25.0000 ug | INTRAMUSCULAR | Status: DC | PRN

## 2019-04-10 MED ORDER — METHOCARBAMOL 500 MG PO TABS
500.0000 mg | ORAL_TABLET | Freq: Four times a day (QID) | ORAL | Status: DC | PRN
  Administered 2019-04-11 (×2): 500 mg via ORAL
  Filled 2019-04-10 (×2): qty 1

## 2019-04-10 MED ORDER — MIDAZOLAM HCL 2 MG/2ML IJ SOLN
INTRAMUSCULAR | Status: AC
  Filled 2019-04-10: qty 2

## 2019-04-10 MED ORDER — CEFAZOLIN SODIUM-DEXTROSE 2-4 GM/100ML-% IV SOLN
2.0000 g | INTRAVENOUS | Status: AC
  Administered 2019-04-10: 2 g via INTRAVENOUS
  Filled 2019-04-10: qty 100

## 2019-04-10 MED ORDER — PROPOFOL 10 MG/ML IV BOLUS
INTRAVENOUS | Status: AC
  Filled 2019-04-10: qty 60

## 2019-04-10 MED ORDER — ACETAMINOPHEN 325 MG PO TABS: 325.0000 mg | ORAL_TABLET | ORAL | Status: DC | PRN

## 2019-04-10 MED ORDER — MORPHINE SULFATE (PF) 4 MG/ML IV SOLN: 0.5000 mg | INTRAVENOUS | Status: DC | PRN

## 2019-04-10 MED ORDER — SODIUM CHLORIDE 0.9 % IR SOLN
Status: DC | PRN
  Administered 2019-04-10 (×2): 1000 mL

## 2019-04-10 MED ORDER — METHOCARBAMOL 500 MG IVPB - SIMPLE MED
500.0000 mg | Freq: Four times a day (QID) | INTRAVENOUS | Status: DC | PRN
  Administered 2019-04-10: 500 mg via INTRAVENOUS
  Filled 2019-04-10: qty 50

## 2019-04-10 MED ORDER — POLYETHYLENE GLYCOL 3350 17 G PO PACK: 17.0000 g | PACK | Freq: Every day | ORAL | Status: DC | PRN

## 2019-04-10 MED ORDER — DIPHENHYDRAMINE HCL 12.5 MG/5ML PO ELIX: 12.5000 mg | ORAL_SOLUTION | ORAL | Status: DC | PRN

## 2019-04-10 MED ORDER — PROPOFOL 500 MG/50ML IV EMUL
INTRAVENOUS | Status: AC
  Filled 2019-04-10: qty 50

## 2019-04-10 MED ORDER — PHENOL 1.4 % MT LIQD: 1.0000 | OROMUCOSAL | Status: DC | PRN

## 2019-04-10 MED ORDER — ONDANSETRON HCL 4 MG/2ML IJ SOLN
INTRAMUSCULAR | Status: DC | PRN
  Administered 2019-04-10: 4 mg via INTRAVENOUS

## 2019-04-10 MED ORDER — FENTANYL CITRATE (PF) 100 MCG/2ML IJ SOLN
INTRAMUSCULAR | Status: DC | PRN
  Administered 2019-04-10: 100 ug via INTRAVENOUS
  Administered 2019-04-10: 50 ug via INTRAVENOUS

## 2019-04-10 MED ORDER — POVIDONE-IODINE 10 % EX SWAB
2.0000 "application " | Freq: Once | CUTANEOUS | Status: AC
  Administered 2019-04-10: 2 via TOPICAL

## 2019-04-10 MED ORDER — PROPOFOL 500 MG/50ML IV EMUL
INTRAVENOUS | Status: DC | PRN
  Administered 2019-04-10: 100 ug/kg/min via INTRAVENOUS

## 2019-04-10 MED ORDER — LACTATED RINGERS IV SOLN
INTRAVENOUS | Status: DC
  Administered 2019-04-10 (×3): via INTRAVENOUS

## 2019-04-10 MED ORDER — MIDAZOLAM HCL 5 MG/5ML IJ SOLN
INTRAMUSCULAR | Status: DC | PRN
  Administered 2019-04-10: 0.5 mg via INTRAVENOUS
  Administered 2019-04-10: .5 mg via INTRAVENOUS
  Administered 2019-04-10: 2 mg via INTRAVENOUS

## 2019-04-10 MED ORDER — ACETAMINOPHEN 160 MG/5ML PO SOLN: 325.0000 mg | ORAL | Status: DC | PRN

## 2019-04-10 MED ORDER — DEXAMETHASONE SODIUM PHOSPHATE 10 MG/ML IJ SOLN
8.0000 mg | Freq: Once | INTRAMUSCULAR | Status: AC
  Administered 2019-04-10: 10 mg via INTRAVENOUS

## 2019-04-10 MED ORDER — ONDANSETRON HCL 4 MG PO TABS: 4.0000 mg | ORAL_TABLET | Freq: Four times a day (QID) | ORAL | Status: DC | PRN

## 2019-04-10 MED ORDER — FLUTICASONE PROPIONATE 50 MCG/ACT NA SUSP
1.0000 | Freq: Two times a day (BID) | NASAL | Status: DC
  Filled 2019-04-10: qty 16

## 2019-04-10 MED ORDER — HYDROCODONE-ACETAMINOPHEN 5-325 MG PO TABS
1.0000 | ORAL_TABLET | ORAL | Status: DC | PRN
  Administered 2019-04-10: 22:00:00 1 via ORAL
  Administered 2019-04-11 (×2): 2 via ORAL
  Administered 2019-04-11: 1 via ORAL
  Filled 2019-04-10: qty 2
  Filled 2019-04-10: qty 1
  Filled 2019-04-10 (×2): qty 2

## 2019-04-10 MED ORDER — BUPIVACAINE HCL (PF) 0.25 % IJ SOLN
INTRAMUSCULAR | Status: AC
  Filled 2019-04-10: qty 30

## 2019-04-10 MED ORDER — METOCLOPRAMIDE HCL 5 MG/ML IJ SOLN: 5.0000 mg | Freq: Three times a day (TID) | INTRAMUSCULAR | Status: DC | PRN

## 2019-04-10 MED ORDER — DOCUSATE SODIUM 100 MG PO CAPS
100.0000 mg | ORAL_CAPSULE | Freq: Two times a day (BID) | ORAL | Status: DC
  Administered 2019-04-10 – 2019-04-11 (×2): 100 mg via ORAL
  Filled 2019-04-10 (×2): qty 1

## 2019-04-10 MED ORDER — ACETAMINOPHEN 10 MG/ML IV SOLN
1000.0000 mg | Freq: Four times a day (QID) | INTRAVENOUS | Status: DC
  Administered 2019-04-10: 1000 mg via INTRAVENOUS
  Filled 2019-04-10: qty 100

## 2019-04-10 MED ORDER — TRANEXAMIC ACID-NACL 1000-0.7 MG/100ML-% IV SOLN
1000.0000 mg | INTRAVENOUS | Status: AC
  Administered 2019-04-10: 1000 mg via INTRAVENOUS
  Filled 2019-04-10: qty 100

## 2019-04-10 MED ORDER — DEXAMETHASONE SODIUM PHOSPHATE 10 MG/ML IJ SOLN
INTRAMUSCULAR | Status: AC
  Filled 2019-04-10: qty 1

## 2019-04-10 MED ORDER — BUPIVACAINE HCL 0.25 % IJ SOLN
INTRAMUSCULAR | Status: DC | PRN
  Administered 2019-04-10: 30 mL

## 2019-04-10 MED ORDER — PROPOFOL 10 MG/ML IV BOLUS
INTRAVENOUS | Status: DC | PRN
  Administered 2019-04-10: 20 mg via INTRAVENOUS

## 2019-04-10 MED ORDER — PHENYLEPHRINE HCL (PRESSORS) 10 MG/ML IV SOLN
INTRAVENOUS | Status: AC
  Filled 2019-04-10: qty 1

## 2019-04-10 MED ORDER — ASPIRIN EC 325 MG PO TBEC
325.0000 mg | DELAYED_RELEASE_TABLET | Freq: Two times a day (BID) | ORAL | Status: DC
  Administered 2019-04-11: 325 mg via ORAL
  Filled 2019-04-10: qty 1

## 2019-04-10 MED ORDER — PANTOPRAZOLE SODIUM 40 MG PO TBEC
40.0000 mg | DELAYED_RELEASE_TABLET | Freq: Every day | ORAL | Status: DC
  Administered 2019-04-11: 40 mg via ORAL
  Filled 2019-04-10: qty 1

## 2019-04-10 MED ORDER — BUPIVACAINE IN DEXTROSE 0.75-8.25 % IT SOLN
INTRATHECAL | Status: DC | PRN
  Administered 2019-04-10: 2 mL via INTRATHECAL

## 2019-04-10 MED ORDER — MENTHOL 3 MG MT LOZG: 1.0000 | LOZENGE | OROMUCOSAL | Status: DC | PRN

## 2019-04-10 MED ORDER — DEXAMETHASONE SODIUM PHOSPHATE 10 MG/ML IJ SOLN
10.0000 mg | Freq: Once | INTRAMUSCULAR | Status: AC
  Administered 2019-04-11: 10 mg via INTRAVENOUS
  Filled 2019-04-10: qty 1

## 2019-04-10 MED ORDER — MEPERIDINE HCL 50 MG/ML IJ SOLN: 6.2500 mg | INTRAMUSCULAR | Status: DC | PRN

## 2019-04-10 MED ORDER — PROPOFOL 10 MG/ML IV BOLUS
INTRAVENOUS | Status: AC
  Filled 2019-04-10: qty 20

## 2019-04-10 MED ORDER — GLYCOPYRROLATE PF 0.2 MG/ML IJ SOSY
PREFILLED_SYRINGE | INTRAMUSCULAR | Status: AC
  Filled 2019-04-10: qty 1

## 2019-04-10 MED ORDER — OXYCODONE HCL 5 MG/5ML PO SOLN: 5.0000 mg | Freq: Once | ORAL | Status: DC | PRN

## 2019-04-10 MED ORDER — ONDANSETRON HCL 4 MG/2ML IJ SOLN: 4.0000 mg | Freq: Four times a day (QID) | INTRAMUSCULAR | Status: DC | PRN

## 2019-04-10 MED ORDER — ACETAMINOPHEN 500 MG PO TABS
500.0000 mg | ORAL_TABLET | Freq: Four times a day (QID) | ORAL | Status: DC
  Administered 2019-04-10 – 2019-04-11 (×3): 500 mg via ORAL
  Filled 2019-04-10 (×3): qty 1

## 2019-04-10 MED ORDER — ONDANSETRON HCL 4 MG/2ML IJ SOLN: 4.0000 mg | Freq: Once | INTRAMUSCULAR | Status: DC | PRN

## 2019-04-10 MED ORDER — LORATADINE 10 MG PO TABS
10.0000 mg | ORAL_TABLET | Freq: Every day | ORAL | Status: DC
  Administered 2019-04-11: 10 mg via ORAL
  Filled 2019-04-10: qty 1

## 2019-04-10 MED ORDER — OXYCODONE HCL 5 MG PO TABS: 5.0000 mg | ORAL_TABLET | Freq: Once | ORAL | Status: DC | PRN

## 2019-04-10 MED ORDER — METHOCARBAMOL 500 MG IVPB - SIMPLE MED
INTRAVENOUS | Status: AC
  Filled 2019-04-10: qty 50

## 2019-04-10 MED ORDER — CHLORHEXIDINE GLUCONATE 4 % EX LIQD: 60.0000 mL | Freq: Once | CUTANEOUS | Status: DC

## 2019-04-10 MED ORDER — FLEET ENEMA 7-19 GM/118ML RE ENEM: 1.0000 | ENEMA | Freq: Once | RECTAL | Status: DC | PRN

## 2019-04-10 MED ORDER — BISACODYL 10 MG RE SUPP: 10.0000 mg | Freq: Every day | RECTAL | Status: DC | PRN

## 2019-04-10 MED ORDER — CEFAZOLIN SODIUM-DEXTROSE 2-4 GM/100ML-% IV SOLN
2.0000 g | Freq: Four times a day (QID) | INTRAVENOUS | Status: AC
  Administered 2019-04-10 – 2019-04-11 (×2): 2 g via INTRAVENOUS
  Filled 2019-04-10 (×2): qty 100

## 2019-04-10 SURGICAL SUPPLY — 69 items
BAG DECANTER FOR FLEXI CONT (MISCELLANEOUS) ×1 IMPLANT
BAG SPEC THK2 15X12 ZIP CLS (MISCELLANEOUS) ×2
BAG ZIPLOCK 12X15 (MISCELLANEOUS) ×4 IMPLANT
BIT DRILL 2.8X128 (BIT) ×2 IMPLANT
BLADE EXTENDED COATED 6.5IN (ELECTRODE) ×2 IMPLANT
BLADE SAW SGTL 73X25 THK (BLADE) IMPLANT
COVER SURGICAL LIGHT HANDLE (MISCELLANEOUS) ×2 IMPLANT
COVER WAND RF STERILE (DRAPES) IMPLANT
DRAPE INCISE IOBAN 66X45 STRL (DRAPES) ×2 IMPLANT
DRAPE ORTHO SPLIT 77X108 STRL (DRAPES) ×4
DRAPE POUCH INSTRU U-SHP 10X18 (DRAPES) ×2 IMPLANT
DRAPE SURG ORHT 6 SPLT 77X108 (DRAPES) ×2 IMPLANT
DRAPE U-SHAPE 47X51 STRL (DRAPES) ×3 IMPLANT
DRSG EMULSION OIL 3X16 NADH (GAUZE/BANDAGES/DRESSINGS) ×2 IMPLANT
DRSG MEPILEX BORDER 4X12 (GAUZE/BANDAGES/DRESSINGS) ×1 IMPLANT
DRSG MEPILEX BORDER 4X4 (GAUZE/BANDAGES/DRESSINGS) ×3 IMPLANT
DRSG MEPILEX BORDER 4X8 (GAUZE/BANDAGES/DRESSINGS) ×2 IMPLANT
DURAPREP 26ML APPLICATOR (WOUND CARE) ×2 IMPLANT
ELECT REM PT RETURN 15FT ADLT (MISCELLANEOUS) ×2 IMPLANT
EVACUATOR 1/8 PVC DRAIN (DRAIN) ×2 IMPLANT
FACESHIELD WRAPAROUND (MASK) ×8 IMPLANT
FACESHIELD WRAPAROUND OR TEAM (MASK) ×4 IMPLANT
GAUZE SPONGE 4X4 12PLY STRL (GAUZE/BANDAGES/DRESSINGS) ×2 IMPLANT
GLOVE BIO SURGEON STRL SZ7 (GLOVE) ×2 IMPLANT
GLOVE BIO SURGEON STRL SZ8 (GLOVE) ×2 IMPLANT
GLOVE BIOGEL PI IND STRL 7.0 (GLOVE) ×1 IMPLANT
GLOVE BIOGEL PI IND STRL 8 (GLOVE) ×1 IMPLANT
GLOVE BIOGEL PI INDICATOR 7.0 (GLOVE) ×1
GLOVE BIOGEL PI INDICATOR 8 (GLOVE) ×1
GOWN STRL REUS W/TWL LRG LVL3 (GOWN DISPOSABLE) ×4 IMPLANT
HANDPIECE INTERPULSE COAX TIP (DISPOSABLE) ×2
HEAD M SROM 36MM PLUS (Hips) IMPLANT
HOLDER FOLEY CATH W/STRAP (MISCELLANEOUS) ×1 IMPLANT
IMMOBILIZER KNEE 20 (SOFTGOODS) ×2
IMMOBILIZER KNEE 20 THIGH 36 (SOFTGOODS) IMPLANT
KIT BASIN OR (CUSTOM PROCEDURE TRAY) ×2 IMPLANT
KIT TURNOVER KIT A (KITS) IMPLANT
LINER MARATHON NEUT +4X60X36 (Hips) ×1 IMPLANT
MANIFOLD NEPTUNE II (INSTRUMENTS) ×2 IMPLANT
MARKER SKIN DUAL TIP RULER LAB (MISCELLANEOUS) ×2 IMPLANT
NDL SAFETY ECLIPSE 18X1.5 (NEEDLE) ×1 IMPLANT
NEEDLE HYPO 18GX1.5 SHARP (NEEDLE) ×2
NS IRRIG 1000ML POUR BTL (IV SOLUTION) ×2 IMPLANT
PACK TOTAL JOINT (CUSTOM PROCEDURE TRAY) ×2 IMPLANT
PASSER SUT SWANSON 36MM LOOP (INSTRUMENTS) ×1 IMPLANT
PENCIL SMOKE EVACUATOR (MISCELLANEOUS) ×1 IMPLANT
PROTECTOR NERVE ULNAR (MISCELLANEOUS) ×2 IMPLANT
SET HNDPC FAN SPRY TIP SCT (DISPOSABLE) IMPLANT
SPONGE LAP 18X18 RF (DISPOSABLE) ×2 IMPLANT
SROM M HEAD 36MM PLUS (Hips) ×2 IMPLANT
STAPLER VISISTAT 35W (STAPLE) IMPLANT
STRIP CLOSURE SKIN 1/2X4 (GAUZE/BANDAGES/DRESSINGS) ×2 IMPLANT
SUCTION FRAZIER HANDLE 12FR (TUBING) ×1
SUCTION TUBE FRAZIER 12FR DISP (TUBING) ×1 IMPLANT
SUT ETHIBOND NAB CT1 #1 30IN (SUTURE) IMPLANT
SUT MNCRL AB 4-0 PS2 18 (SUTURE) ×1 IMPLANT
SUT STRATAFIX 0 PDS 27 VIOLET (SUTURE) ×2
SUT VIC AB 1 CT1 27 (SUTURE) ×6
SUT VIC AB 1 CT1 27XBRD ANTBC (SUTURE) ×3 IMPLANT
SUT VIC AB 2-0 CT1 27 (SUTURE) ×6
SUT VIC AB 2-0 CT1 TAPERPNT 27 (SUTURE) ×3 IMPLANT
SUTURE STRATFX 0 PDS 27 VIOLET (SUTURE) ×1 IMPLANT
SWAB COLLECTION DEVICE MRSA (MISCELLANEOUS) IMPLANT
SWAB CULTURE ESWAB REG 1ML (MISCELLANEOUS) IMPLANT
SYR 50ML LL SCALE MARK (SYRINGE) ×2 IMPLANT
TOWEL OR 17X26 10 PK STRL BLUE (TOWEL DISPOSABLE) ×4 IMPLANT
TRAY FOLEY MTR SLVR 16FR STAT (SET/KITS/TRAYS/PACK) ×2 IMPLANT
WATER STERILE IRR 1000ML POUR (IV SOLUTION) ×4 IMPLANT
YANKAUER SUCT BULB TIP 10FT TU (MISCELLANEOUS) ×2 IMPLANT

## 2019-04-10 NOTE — Anesthesia Preprocedure Evaluation (Addendum)
Anesthesia Evaluation  Patient identified by MRN, date of birth, ID band Patient awake    Reviewed: Allergy & Precautions, NPO status , Patient's Chart, lab work & pertinent test results  History of Anesthesia Complications Negative for: history of anesthetic complications  Airway Mallampati: IV  TM Distance: >3 FB Neck ROM: Full  Mouth opening: Limited Mouth Opening  Dental  (+) Dental Advisory Given   Pulmonary neg pulmonary ROS,    breath sounds clear to auscultation       Cardiovascular (-) anginanegative cardio ROS   Rhythm:Regular Rate:Normal     Neuro/Psych DDD    GI/Hepatic negative GI ROS, Neg liver ROS,   Endo/Other  Morbid obesity  Renal/GU negative Renal ROS     Musculoskeletal  (+) Arthritis  (psoriatic arthritis), Osteoarthritis,    Abdominal (+) + obese,   Peds  Hematology  (+) Blood dyscrasia, anemia ,   Anesthesia Other Findings   Reproductive/Obstetrics negative OB ROS                             Anesthesia Physical  Anesthesia Plan  ASA: II  Anesthesia Plan: Spinal and MAC   Post-op Pain Management:    Induction:   PONV Risk Score and Plan: 1 and Ondansetron and Dexamethasone  Airway Management Planned: Natural Airway, Simple Face Mask and Nasal Cannula  Additional Equipment:   Intra-op Plan:   Post-operative Plan:   Informed Consent: I have reviewed the patients History and Physical, chart, labs and discussed the procedure including the risks, benefits and alternatives for the proposed anesthesia with the patient or authorized representative who has indicated his/her understanding and acceptance.     Dental advisory given  Plan Discussed with: Surgeon and CRNA  Anesthesia Plan Comments: (Plan routine monitors, SAB)        Anesthesia Quick Evaluation

## 2019-04-10 NOTE — Anesthesia Postprocedure Evaluation (Signed)
Anesthesia Post Note  Patient: [name]  Procedure(s) Performed: Left hip bearing surface vs total hip arthroplasty revision (Left Hip)     Patient location during evaluation: PACU Anesthesia Type: MAC and Spinal Level of consciousness: awake and alert Pain management: pain level controlled Vital Signs Assessment: post-procedure vital signs reviewed and stable Respiratory status: spontaneous breathing, nonlabored ventilation, respiratory function stable and patient connected to nasal cannula oxygen Cardiovascular status: stable and blood pressure returned to baseline Postop Assessment: no apparent nausea or vomiting Anesthetic complications: no    Last Vitals:  Vitals:   04/10/19 1615 04/10/19 1630  BP: 139/76 130/76  Pulse: 68 (!) 54  Resp: 13 (!) 9  Temp:    SpO2: 100% 100%    Last Pain:  Vitals:   04/10/19 1615  TempSrc:   PainSc: 0-No pain                 Corbitt Cloke

## 2019-04-10 NOTE — Anesthesia Procedure Notes (Signed)
Spinal  Patient location during procedure: OR End time: 04/10/2019 1:40 PM Staffing Resident/CRNA: Lissa Morales, CRNA Performed: anesthesiologist  Preanesthetic Checklist Completed: patient identified, site marked, surgical consent, pre-op evaluation, timeout performed, IV checked, risks and benefits discussed and monitors and equipment checked Spinal Block Patient position: sitting Prep: DuraPrep Patient monitoring: heart rate, continuous pulse ox and blood pressure Approach: midline Location: L3-4 Injection technique: single-shot Needle Needle type: Pencan  Needle gauge: 24 G Needle length: 9 cm Additional Notes Expiration date of kit checked and confirmed. Patient tolerated procedure well, without complications.

## 2019-04-10 NOTE — Brief Op Note (Signed)
04/10/2019  3:21 PM  PATIENT:  [name]  53 y.o. 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  PROCEDURE:  Procedure(s) with comments: Left hip bearing surface vs total hip arthroplasty revision (Left) - 143min  SURGEON:  Surgeon(s) and Role:    [name], MD - Primary  PHYSICIAN ASSISTANT:   ASSISTANTS: Theresa Duty, PA-C   ANESTHESIA:   spinal  EBL:  50 mL   BLOOD ADMINISTERED:none  DRAINS: (Medium) Hemovact drain(s) in the left hip with  Suction Open   LOCAL MEDICATIONS USED:  MARCAINE     COUNTS:  YES  TOURNIQUET:  * No tourniquets in log *  DICTATION: .Other Dictation: Dictation Number [id number]  PLAN OF CARE: Admit for overnight observation  PATIENT DISPOSITION:  PACU - hemodynamically stable.

## 2019-04-10 NOTE — Discharge Instructions (Signed)
Dr. [name] Total Joint Specialist [facility] [address], [place], [place] [address] [phone]  POSTERIOR TOTAL HIP REVISION POSTOPERATIVE DIRECTIONS  Hip Rehabilitation, Guidelines Following Surgery  The results of a hip operation are greatly improved after range of motion and muscle strengthening exercises. Follow all safety measures which are given to protect your hip. If any of these exercises cause increased pain or swelling in your joint, decrease the amount until you are comfortable again. Then slowly increase the exercises. Call your caregiver if you have problems or questions.   HOME CARE INSTRUCTIONS   Remove items at home which could result in a fall. This includes throw rugs or furniture in walking pathways.   ICE to the affected hip every three hours for 30 minutes at a time and then as needed for pain and swelling.  Continue to use ice on the hip for pain and swelling from surgery. You may notice swelling that will progress down to the foot and ankle.  This is normal after surgery.  Elevate the leg when you are not up walking on it.    Continue to use the breathing machine which will help keep your temperature down.  It is common for your temperature to cycle up and down following surgery, especially at night when you are not up moving around and exerting yourself.  The breathing machine keeps your lungs expanded and your temperature down.  DIET You may resume your previous home diet once your are discharged from the hospital.  DRESSING / WOUND CARE / SHOWERING You may change your dressing 3-5 days after surgery.  Then change the dressing every day with sterile gauze.  Please use good hand washing techniques before changing the dressing.  Do not use any lotions or creams on the incision until instructed by your surgeon. You may start showering once you are discharged home but do not submerge the incision under water. Just pat the incision dry and  apply a dry gauze dressing on daily. Change the surgical dressing daily and reapply a dry dressing each time.  ACTIVITY Walk with your walker as instructed. Use walker as long as suggested by your caregivers. Avoid periods of inactivity such as sitting longer than an hour when not asleep. This helps prevent blood clots.  You may resume a sexual relationship in one month or when given the OK by your doctor.  You may return to work once you are cleared by your doctor.  Do not drive a car for 6 weeks or until released by you surgeon.  Do not drive while taking narcotics.  WEIGHT BEARING Weight bearing as tolerated with assist device (walker, cane, etc) as directed, use it as long as suggested by your surgeon or therapist, typically at least 4-6 weeks.  POSTOPERATIVE CONSTIPATION PROTOCOL Constipation - defined medically as fewer than three stools per week and severe constipation as less than one stool per week.  One of the most common issues patients have following surgery is constipation.  Even if you have a regular bowel pattern at home, your normal regimen is likely to be disrupted due to multiple reasons following surgery.  Combination of anesthesia, postoperative narcotics, change in appetite and fluid intake all can affect your bowels.  In order to avoid complications following surgery, here are some recommendations in order to help you during your recovery period.  Colace (docusate) - Pick up an over-the-counter form of Colace or another stool softener and take twice a day as  long as you are requiring postoperative pain medications.  Take with a full glass of water daily.  If you experience loose stools or diarrhea, hold the colace until you stool forms back up.  If your symptoms do not get better within 1 week or if they get worse, check with your doctor.  Dulcolax (bisacodyl) - Pick up over-the-counter and take as directed by the product packaging as needed to assist with the movement of  your bowels.  Take with a full glass of water.  Use this product as needed if not relieved by Colace only.   MiraLax (polyethylene glycol) - Pick up over-the-counter to have on hand.  MiraLax is a solution that will increase the amount of water in your bowels to assist with bowel movements.  Take as directed and can mix with a glass of water, juice, soda, coffee, or tea.  Take if you go more than two days without a movement. Do not use MiraLax more than once per day. Call your doctor if you are still constipated or irregular after using this medication for 7 days in a row.  If you continue to have problems with postoperative constipation, please contact the office for further assistance and recommendations.  If you experience "the worst abdominal pain ever" or develop nausea or vomiting, please contact the office immediatly for further recommendations for treatment.  ITCHING  If you experience itching with your medications, try taking only a single pain pill, or even half a pain pill at a time.  You can also use Benadryl over the counter for itching or also to help with sleep.   TED HOSE STOCKINGS Wear the elastic stockings on both legs for three weeks following surgery during the day but you may remove then at night for sleeping.  MEDICATIONS See your medication summary on the After Visit Summary that the nursing staff will review with you prior to discharge.  You may have some home medications which will be placed on hold until you complete the course of blood thinner medication.  It is important for you to complete the blood thinner medication as prescribed by your surgeon.  Continue your approved medications as instructed at time of discharge.  PRECAUTIONS If you experience chest pain or shortness of breath - call 911 immediately for transfer to the hospital emergency department.  If you develop a fever greater that 101 F, purulent drainage from wound, increased redness or drainage from  wound, foul odor from the wound/dressing, or calf pain - CONTACT YOUR SURGEON.                                                   FOLLOW-UP APPOINTMENTS Make sure you keep all of your appointments after your operation with your surgeon and caregivers. You should call the office at the above phone number and make an appointment for approximately two weeks after the date of your surgery or on the date instructed by your surgeon outlined in the "After Visit Summary".  RANGE OF MOTION AND STRENGTHENING EXERCISES  These exercises are designed to help you keep full movement of your hip joint. Follow your caregiver's or physical therapist's instructions. Perform all exercises about fifteen times, three times per day or as directed. Exercise both hips, even if you have had only one joint replacement. These exercises can be done on a  training (exercise) mat, on the floor, on a table or on a bed. Use whatever works the best and is most comfortable for you. Use music or television while you are exercising so that the exercises are a pleasant break in your day. This will make your life better with the exercises acting as a break in routine you can look forward to.   Lying on your back, slowly slide your foot toward your buttocks, raising your knee up off the floor. Then slowly slide your foot back down until your leg is straight again.   Lying on your back spread your legs as far apart as you can without causing discomfort.   Lying on your side, raise your upper leg and foot straight up from the floor as far as is comfortable. Slowly lower the leg and repeat.   Lying on your back, tighten up the muscle in the front of your thigh (quadriceps muscles). You can do this by keeping your leg straight and trying to raise your heel off the floor. This helps strengthen the largest muscle supporting your knee.   Lying on your back, tighten up the muscles of your buttocks both with the legs straight and with the knee bent  at a comfortable angle while keeping your heel on the floor.   IF YOU ARE TRANSFERRED TO A SKILLED REHAB FACILITY If the patient is transferred to a skilled rehab facility following release from the hospital, a list of the current medications will be sent to the facility for the patient to continue.  When discharged from the skilled rehab facility, please have the facility set up the patient's [place] prior to being released. Also, the skilled facility will be responsible for providing the patient with their medications at time of release from the facility to include their pain medication, the muscle relaxants, and their blood thinner medication. If the patient is still at the rehab facility at time of the two week follow up appointment, the skilled rehab facility will also need to assist the patient in arranging follow up appointment in our office and any transportation needs.  MAKE SURE YOU:   Understand these instructions.   Get help right away if you are not doing well or get worse.    Pick up stool softner and laxative for home use following surgery while on pain medications. Do not submerge incision under water. Please use good hand washing techniques while changing dressing each day. May shower starting three days after surgery. Please use a clean towel to pat the incision dry following showers. Continue to use ice for pain and swelling after surgery. Do not use any lotions or creams on the incision until instructed by your surgeon.

## 2019-04-10 NOTE — Transfer of Care (Signed)
Immediate Anesthesia Transfer of Care Note  Patient: [name]  Procedure(s) Performed: Left hip bearing surface vs total hip arthroplasty revision (Left Hip)  Patient Location: PACU  Anesthesia Type:Spinal  Level of Consciousness: awake, alert , oriented and patient cooperative  Airway & Oxygen Therapy: Patient Spontanous Breathing and Patient connected to face mask oxygen  Post-op Assessment: Report given to RN and Post -op Vital signs reviewed and stable  Post vital signs: stable  Last Vitals:  Vitals Value Taken Time  BP 129/77 04/10/19 1540  Temp    Pulse 68 04/10/19 1544  Resp 17 04/10/19 1544  SpO2 100 % 04/10/19 1544  Vitals shown include unvalidated device data.  Last Pain:  Vitals:   04/10/19 1540  TempSrc:   PainSc: (P) 0-No pain      Patients Stated Pain Goal: 4 ([id number])  Complications: No apparent anesthesia complications

## 2019-04-10 NOTE — Interval H&P Note (Signed)
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  The various methods of treatment have been discussed with the patient and family. After consideration of risks, benefits and other options for treatment, the patient has consented to  Procedure(s) with comments: Left hip bearing surface vs total hip arthroplasty revision (Left) - 119min as a surgical intervention.  The patient's history has been reviewed, patient examined, no change in status, stable for surgery.  I have reviewed the patient's chart and labs.  Questions were answered to the patient's satisfaction.     Pilar Plate Ernisha Sorn

## 2019-04-10 NOTE — Op Note (Signed)
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  PROCEDURE:  Left hip bearing surface revision.  SURGEON:  [name], MD  ASSISTANT:  Theresa Duty, PA-C  ANESTHESIA:  Spinal.  ESTIMATED BLOOD LOSS:  50  DRAINS:  Hemovac x1.  COMPLICATIONS:  None.  CONDITION:  Stable to recovery.  BRIEF CLINICAL NOTE:  The patient is a 53 year old male who had a left total hip arthroplasty done several years ago.  It was a metal-on-metal hip.  Recently he started having increasing left hip pain.  His ion levels of cobalt and chromium were somewhat  elevated and a MARS MRI was abnormal.  Given his pain and abnormal findings on objective studies, it was felt that it would be in his best interest to at least change the bearing surface and if necessary revised the entire hip.  If there was loosening  or damage.  He presents now for the above-mentioned procedure.  Of note, he had a bearing surface revision done on his right hip approximately 2 years ago for metallosis also.  PROCEDURE IN DETAIL:  After successful administration of a spinal anesthetic, the patient was placed in the right lateral decubitus position with the left side up and held with a hip positioner.  Left lower extremity was isolated from his perineum with  plastic drapes and prepped and draped in the usual sterile fashion.  Posterolateral incision was made with a 10 blade through subcutaneous tissue to the level of the fascia lata, which was incised in line with the skin incision.  Sciatic nerve was  palpated and protected.  Posterior pseudocapsule was incised and elevated off the posterior femur.   To this point, we did not see any fluid or metal stained tissue.  When I got into the joint, there was a small amount of fluid present consistent with a  metal stained fluid.  There is also very small amount of metal staining in the periarticular tissues, but far less than expected.  There was no bony damage and no damage to the abductors.  After the joint was exposed, I dislocated the hip and removed the  femoral head.  There is a minimal amount of trunnionosis present.  I was able to remove the metal debris from the trunnion of the prosthesis and it did not have any damage on it.  We then retracted the femur anteriorly to gain acetabular exposure.  Acetabular retractors were placed and the soft tissue around the acetabular rim was removed.  All the metal stained tissue around the periarticular space is removed back to normal appearing tissue.  I used the extraction device to remove the metal liner  from the Pinnacle acetabular shell.  It was a 60 mm shell.  It is in excellent position and is well fixed.  We switched out to a 36 mm neutral +4 marathon liner and I impacted that into the acetabular shell with excellent locking an excellent fit.  We then placed a 36+0 trial on the femoral neck.  Hip was reduced with outstanding stability.  This is the same size head that was removed earlier in the case.  He has full extension, full external rotation, 70 degrees flexion, 40 degrees, adduction 90  degrees, internal rotation, then 90  degrees of flexion and 70 degrees of internal rotation with the trial in place.  By placing the left leg on top of the right I felt as though the leg lengths were equal.  The hip was reduced and the trial removed.  The  permanent 32+0 metal femoral head was placed.  I did not want to place a ceramic head on this trunnion that had a previous head and had some metal staining.  Even though the metal staining was removed, I did not want to place a ceramic head on it.  We  placed the  32.0 metal head for the S-ROM stem.  The hip was then reduced with the same stability parameters.  The wound was copiously irrigated with saline solution.  The posterior pseudocapsule reattached to the femur through drill holes with Ethibond  suture.  Fascia lata was closed over a Hemovac drain with running 0 Stratafix suture.  Subcutaneous was injected with 30 mL of 0.25% Marcaine.  Deep subcutaneous was then closed with a running #1 Stratafix.  Superficial subcutaneous was closed with  interrupted 2-0 Vicryl and subcuticular running 4-0 Monocryl.  Incisions were cleaned and dried and Steri-Strips and a bulky sterile dressing are applied.  The drain was hooked to suction.  He was then placed into a knee immobilizer, awakened and transported to recovery in stable condition.  Note that a surgical assistant was of medical necessity for this procedure to do it in a safe and expeditious manner.  Surgical assistance necessary for proper positioning of the limb and retraction of vital neurovascular structures.  Positioning of the  limb was important for removal of the old implant and for safe and accurate placement of the new implant.  TN/NUANCE  D:04/10/2019 T:04/10/2019 JOB:[id number]

## 2019-04-10 NOTE — Addendum Note (Signed)
Addendum  created 04/10/19 1821 by Niel Hummer, CRNA   Intraprocedure Event edited, Intraprocedure Staff edited

## 2019-04-11 ENCOUNTER — Encounter: Payer: Self-pay | Admitting: *Deleted

## 2019-04-11 LAB — CBC
HCT: 47.2 % (ref 39.0–52.0)
Hemoglobin: 15.9 g/dL (ref 13.0–17.0)
MCH: 31.4 pg (ref 26.0–34.0)
MCHC: 33.7 g/dL (ref 30.0–36.0)
MCV: 93.1 fL (ref 80.0–100.0)
Platelets: 120 10*3/uL — ABNORMAL LOW (ref 150–400)
RBC: 5.07 MIL/uL (ref 4.22–5.81)
RDW: 12 % (ref 11.5–15.5)
WBC: 9.2 10*3/uL (ref 4.0–10.5)
nRBC: 0 % (ref 0.0–0.2)

## 2019-04-11 LAB — BASIC METABOLIC PANEL
Anion gap: 8 (ref 5–15)
BUN: 18 mg/dL (ref 6–20)
CO2: 24 mmol/L (ref 22–32)
Calcium: 8.9 mg/dL (ref 8.9–10.3)
Chloride: 106 mmol/L (ref 98–111)
Creatinine, Ser: 0.96 mg/dL (ref 0.61–1.24)
GFR calc Af Amer: >60 mL/min (ref >60)
GFR calc non Af Amer: >60 mL/min (ref >60)
Glucose, Bld: 135 mg/dL — ABNORMAL HIGH (ref 70–99)
Potassium: 4.3 mmol/L (ref 3.5–5.1)
Sodium: 138 mmol/L (ref 135–145)

## 2019-04-11 MED ORDER — HYDROCODONE-ACETAMINOPHEN 5-325 MG PO TABS: 1.0000 | ORAL_TABLET | Freq: Four times a day (QID) | ORAL | 0 refills | Status: DC | PRN

## 2019-04-11 MED ORDER — ASPIRIN 325 MG PO TBEC: 325.0000 mg | DELAYED_RELEASE_TABLET | Freq: Two times a day (BID) | ORAL | 0 refills | Status: DC

## 2019-04-11 MED ORDER — TRAMADOL HCL 50 MG PO TABS: 50.0000 mg | ORAL_TABLET | Freq: Four times a day (QID) | ORAL | 0 refills | Status: DC | PRN

## 2019-04-11 MED ORDER — METHOCARBAMOL 500 MG PO TABS: 500.0000 mg | ORAL_TABLET | Freq: Four times a day (QID) | ORAL | 0 refills | Status: DC | PRN

## 2019-04-11 MED FILL — traMADol HCL 50 MG TABS: 50 | 5 days supply | Qty: 40 | Fill #0

## 2019-04-11 MED FILL — ASPIRIN EC 325 MG TABLET: 325 | 20 days supply | Qty: 40 | Fill #0

## 2019-04-11 MED FILL — METHOCARBAMOL 500 MG TABS: 500 | 10 days supply | Qty: 40 | Fill #0

## 2019-04-11 MED FILL — HYDROCODON-APAP 5-325: 5-325 | 5 days supply | Qty: 40 | Fill #0

## 2019-04-11 NOTE — Progress Notes (Signed)
   Subjective: 1 Day Post-Op Procedure(s) (LRB): Left hip bearing surface vs total hip arthroplasty revision (Left) Patient reports pain as mild.   Patient seen in rounds with Dr. Wynelle Link. Patient is well, and has had no acute complaints or problems other than some soreness in the left hip. No issues overnight. No SOB or chest pain. Foley removed this morning. Good urine output. Positive flatus.  We will start therapy today.  Plan is to go Home after hospital stay.  Objective: Vital signs in last 24 hours: Temp:  [97.7 F (36.5 C)-99.5 F (37.5 C)] 97.7 F (36.5 C) (12/10 0344) Pulse Rate:  [53-84] 69 (12/10 0344) Resp:  [8-19] 18 (12/10 0344) BP: (129-156)/(76-90) 141/78 (12/10 0344) SpO2:  [97 %-100 %] 100 % (12/10 0344) Weight:  [111.6 kg] 111.6 kg (12/09 1143)  Intake/Output from previous day:  Intake/Output Summary (Last 24 hours) at 04/11/2019 0723 Last data filed at 04/11/2019 [id number] Gross per 24 hour  Intake 2573.75 ml  Output 2370 ml  Net 203.75 ml     Labs: Recent Labs    04/09/19 1024 04/11/19 0338  HGB 16.3 15.9   Recent Labs    04/09/19 1024 04/11/19 0338  WBC 5.4 9.2  RBC 5.21 5.07  HCT 48.7 47.2  PLT 120* 120*   Recent Labs    04/09/19 1024 04/11/19 0338  NA 140 138  K 4.2 4.3  CL 106 106  CO2 24 24  BUN 22* 18  CREATININE 0.98 0.96  GLUCOSE 91 135*  CALCIUM 9.4 8.9   Recent Labs    04/09/19 1024  INR 0.9    EXAM General - Patient is Alert and Oriented Extremity - Neurologically intact Sensation intact distally Intact pulses distally Dorsiflexion/Plantar flexion intact No cellulitis present Compartment soft Dressing - dressing C/D/I Motor Function - intact, moving foot and toes well on exam.  Hemovac pulled without difficulty.  Past Medical History:  Diagnosis Date  . Anemia   . History of avascular necrosis of capital femoral epiphysis 2006, 2007   Bilateral Femoral Head  . HLD (hyperlipidemia)   . Pneumonia    twice   . Psoriatic arthritis (HCC)     Assessment/Plan: 1 Day Post-Op Procedure(s) (LRB): Left hip bearing surface vs total hip arthroplasty revision (Left) Active Problems:   Failed total hip arthroplasty (HCC)  Estimated body mass index is 34.5 kg/m as calculated from the following:   Height as of this encounter: 5' 10.8" (1.798 m).   Weight as of this encounter: 111.6 kg. Advance diet Up with therapy D/C IV fluids when tolerating POs well  DVT Prophylaxis - Aspirin Weight Bearing As Tolerated   He will get up with therapy today. Hopeful for DC home today if he is progressing and meeting his goals. Discharge instructions given. Follow up in office in 2 weeks. Posterior hip precautions.   [name], PA-C Orthopaedic Surgery 04/11/2019, 7:23 AM

## 2019-04-11 NOTE — Evaluation (Signed)
Physical Therapy Evaluation Patient Details Name: [name] MRN: [id number] DOB: 01-13-1966 Today's Date: 04/11/2019   History of Present Illness  Pt s/p L THR revision and with hx of bil THR  and R THR revision  Clinical Impression  Pt s/p L THR revision and presents with decreased L LE strength/ROM, post op pain and posterior THP limiting functional mobility.  Pt should progress to dc home with family assist.   Follow Up Recommendations Follow surgeon's recommendation for DC plan and follow-up therapies    Equipment Recommendations  None recommended by PT    Recommendations for Other Services       Precautions / Restrictions Precautions Precautions: Posterior Hip Precaution Booklet Issued: Yes (comment) Precaution Comments: Precautions reviewed x 3 Restrictions Weight Bearing Restrictions: No      Mobility  Bed Mobility Overal bed mobility: Needs Assistance Bed Mobility: Supine to Sit     Supine to sit: Min assist     General bed mobility comments: cues for sequence, use of R LE to self assist and adherence to THP  Transfers Overall transfer level: Needs assistance   Transfers: Sit to/from Stand Sit to Stand: Min assist         General transfer comment: cues for LE management, use of UEs to self assist and adherence to THP  Ambulation/Gait Ambulation/Gait assistance: Min assist;Min guard Gait Distance (Feet): 120 Feet Assistive device: Rolling walker (2 wheeled) Gait Pattern/deviations: Step-to pattern;Decreased step length - right;Decreased step length - left;Shuffle;Trunk flexed Gait velocity: decr   General Gait Details: cues for sequence, posture and position from [employer]            Wheelchair Mobility    Modified Rankin (Stroke Patients Only)       Balance Overall balance assessment: Mild deficits observed, not formally tested                                           Pertinent Vitals/Pain Pain Assessment:  0-10 Pain Score: 3  Pain Location: L hip Pain Descriptors / Indicators: Aching;Sore Pain Intervention(s): Limited activity within patient's tolerance;Monitored during session;Premedicated before session;Ice applied    Home Living Family/patient expects to be discharged to:: Private residence Living Arrangements: Spouse/significant other Available Help at Discharge: Family Type of Home: House Home Access: Level entry     Home Layout: Two level;1/2 bath on main level;Bed/bath upstairs Home Equipment: [place] - single point;Crutches;Walker - 2 wheels;Bedside commode Additional Comments: Pt sleeps in recliner or "back chair"    Prior Function Level of Independence: Independent               Hand Dominance        Extremity/Trunk Assessment   Upper Extremity Assessment Upper Extremity Assessment: Overall WFL for tasks assessed    Lower Extremity Assessment Lower Extremity Assessment: LLE deficits/detail    Cervical / Trunk Assessment Cervical / Trunk Assessment: Normal  Communication   Communication: No difficulties  Cognition Arousal/Alertness: Awake/alert Behavior During Therapy: WFL for tasks assessed/performed Overall Cognitive Status: Within Functional Limits for tasks assessed                                        General Comments      Exercises     Assessment/Plan  PT Assessment Patient needs continued PT services  PT Problem List Decreased strength;Decreased range of motion;Decreased activity tolerance;Decreased balance;Decreased mobility;Decreased knowledge of use of DME;Pain;Obesity       PT Treatment Interventions DME instruction;Gait training;Stair training;Functional mobility training;Therapeutic activities;Therapeutic exercise;Patient/family education    PT Goals (Current goals can be found in the Care Plan section)  Acute Rehab PT Goals Patient Stated Goal: Regain IND PT Goal Formulation: With patient Time For Goal  Achievement: 04/18/19 Potential to Achieve Goals: Good    Frequency 7X/week   Barriers to discharge        Co-evaluation               AM-PAC PT "6 Clicks" Mobility  Outcome Measure Help needed turning from your back to your side while in a flat bed without using bedrails?: A Little Help needed moving from lying on your back to sitting on the side of a flat bed without using bedrails?: A Little Help needed moving to and from a bed to a chair (including a wheelchair)?: A Little Help needed standing up from a chair using your arms (e.g., wheelchair or bedside chair)?: A Little Help needed to walk in hospital room?: A Little Help needed climbing 3-5 steps with a railing? : A Little 6 Click Score: 18    End of Session Equipment Utilized During Treatment: Gait belt Activity Tolerance: Patient tolerated treatment well Patient left: Other (comment)(pt using bathroom) Nurse Communication: Mobility status PT Visit Diagnosis: Difficulty in walking, not elsewhere classified (R26.2)    Time: [id number] PT Time Calculation (min) (ACUTE ONLY): 26 min   Charges:   PT Evaluation $PT Eval Low Complexity: 1 Low PT Treatments $Gait Training: 8-22 mins        [place] Pager [phone] Office [phone]   Searra Carnathan 04/11/2019, 12:21 PM

## 2019-04-11 NOTE — Progress Notes (Signed)
Physical Therapy Treatment Patient Details Name: [name] MRN: [id number] DOB: 1966-01-03 Today's Date: 04/11/2019    History of Present Illness Pt s/p L THR revision and with hx of bil THR  and R THR revision    PT Comments    Pt performed home therex program with assist.  Written instruction provided to review.   Follow Up Recommendations  Follow surgeon's recommendation for DC plan and follow-up therapies     Equipment Recommendations  None recommended by PT    Recommendations for Other Services       Precautions / Restrictions Precautions Precautions: Posterior Hip Precaution Booklet Issued: Yes (comment) Precaution Comments: Precautions reviewed x 2 Restrictions Weight Bearing Restrictions: No Other Position/Activity Restrictions: WBAT    Mobility  Bed Mobility Overal bed mobility: Needs Assistance Bed Mobility: Supine to Sit     Supine to sit: Min assist     General bed mobility comments: cues for sequence, use of R LE to self assist and adherence to THP  Transfers Overall transfer level: Needs assistance   Transfers: Sit to/from Stand Sit to Stand: Min assist         General transfer comment: cues for LE management, use of UEs to self assist and adherence to THP  Ambulation/Gait Ambulation/Gait assistance: Min assist;Min guard Gait Distance (Feet): 120 Feet Assistive device: Rolling walker (2 wheeled) Gait Pattern/deviations: Step-to pattern;Decreased step length - right;Decreased step length - left;Shuffle;Trunk flexed Gait velocity: decr   General Gait Details: cues for sequence, posture and position from [employer]             Wheelchair Mobility    Modified Rankin (Stroke Patients Only)       Balance Overall balance assessment: Mild deficits observed, not formally tested                                          Cognition Arousal/Alertness: Awake/alert Behavior During Therapy: WFL for tasks  assessed/performed Overall Cognitive Status: Within Functional Limits for tasks assessed                                        Exercises Total Joint Exercises Ankle Circles/Pumps: AROM;Both;15 reps;Supine Quad Sets: AROM;Both;10 reps;Supine Heel Slides: AAROM;Left;20 reps;Supine Hip ABduction/ADduction: AAROM;Left;15 reps;Supine    General Comments        Pertinent Vitals/Pain Pain Assessment: 0-10 Pain Score: 3  Pain Location: L hip Pain Descriptors / Indicators: Aching;Sore Pain Intervention(s): Limited activity within patient's tolerance;Monitored during session;Premedicated before session;Ice applied    Home Living Family/patient expects to be discharged to:: Private residence Living Arrangements: Spouse/significant other Available Help at Discharge: Family Type of Home: House Home Access: Level entry   Home Layout: Two level;1/2 bath on main level;Bed/bath upstairs Home Equipment: [place] - single point;Crutches;Walker - 2 wheels;Bedside commode Additional Comments: Pt sleeps in recliner or "back chair"    Prior Function Level of Independence: Independent          PT Goals (current goals can now be found in the care plan section) Acute Rehab PT Goals Patient Stated Goal: Regain IND PT Goal Formulation: With patient Time For Goal Achievement: 04/18/19 Potential to Achieve Goals: Good Progress towards PT goals: Progressing toward goals    Frequency    7X/week  PT Plan Current plan remains appropriate    Co-evaluation              AM-PAC PT "6 Clicks" Mobility   Outcome Measure  Help needed turning from your back to your side while in a flat bed without using bedrails?: A Little Help needed moving from lying on your back to sitting on the side of a flat bed without using bedrails?: A Little Help needed moving to and from a bed to a chair (including a wheelchair)?: A Little Help needed standing up from a chair using your arms  (e.g., wheelchair or bedside chair)?: A Little Help needed to walk in hospital room?: A Little Help needed climbing 3-5 steps with a railing? : A Little 6 Click Score: 18    End of Session Equipment Utilized During Treatment: Gait belt Activity Tolerance: Patient tolerated treatment well Patient left: Other (comment) Nurse Communication: Mobility status PT Visit Diagnosis: Difficulty in walking, not elsewhere classified (R26.2)     Time: [id number] PT Time Calculation (min) (ACUTE ONLY): 20 min  Charges:  $Gait Training: 8-22 mins $Therapeutic Exercise: 8-22 mins                     Debe Coder PT Acute Rehabilitation Services Pager [phone] Office [phone]    [name] 04/11/2019, 12:28 PM

## 2019-04-11 NOTE — Progress Notes (Signed)
Physical Therapy Treatment Patient Details Name: [name] MRN: [id number] DOB: [birth year] Today's Date: 04/11/2019    History of Present Illness Pt s/p L THR revision and with hx of bil THR  and R THR revision    PT Comments    Pt progressing well with mobility.  Pt's spouse present to review car transfers, stairs and home therex with written instruction provided.     Follow Up Recommendations  Follow surgeon's recommendation for DC plan and follow-up therapies     Equipment Recommendations  None recommended by PT    Recommendations for Other Services       Precautions / Restrictions Precautions Precautions: Posterior Hip Precaution Booklet Issued: Yes (comment) Precaution Comments: Pt recalls 2/3 THP - all reviewed with pt and spouse Restrictions Weight Bearing Restrictions: No Other Position/Activity Restrictions: WBAT    Mobility  Bed Mobility Overal bed mobility: Needs Assistance Bed Mobility: Supine to Sit     Supine to sit: Min assist     General bed mobility comments: cues for sequence, use of R LE to self assist and adherence to THP  Transfers Overall transfer level: Needs assistance Equipment used: Rolling walker (2 wheeled) Transfers: Sit to/from Stand Sit to Stand: Min guard;Supervision         General transfer comment: cues for LE management, use of UEs to self assist and adherence to THP  Ambulation/Gait Ambulation/Gait assistance: Min guard;Supervision Gait Distance (Feet): 100 Feet Assistive device: Rolling walker (2 wheeled) Gait Pattern/deviations: Step-to pattern;Decreased step length - right;Decreased step length - left;Shuffle;Trunk flexed Gait velocity: decr   General Gait Details: min cues for sequence, posture and position from RW   Stairs Stairs: Yes Stairs assistance: Min assist Stair Management: No rails;One rail Right;Step to pattern;Forwards;With walker;With cane Number of Stairs: 6 General stair comments: single  step fwd with RW; 5 steps with rail and cane; cues for sequence and foot/cane placement   Wheelchair Mobility    Modified Rankin (Stroke Patients Only)       Balance Overall balance assessment: Mild deficits observed, not formally tested                                          Cognition Arousal/Alertness: Awake/alert Behavior During Therapy: WFL for tasks assessed/performed Overall Cognitive Status: Within Functional Limits for tasks assessed                                        Exercises Total Joint Exercises Ankle Circles/Pumps: AROM;Both;15 reps;Supine Quad Sets: AROM;Both;10 reps;Supine Heel Slides: AAROM;Left;Supine;10 reps Hip ABduction/ADduction: AAROM;Left;Supine;10 reps Long Arc Quad: AROM;Left;10 reps;Seated    General Comments        Pertinent Vitals/Pain Pain Assessment: 0-10 Pain Score: 4  Pain Location: L hip Pain Descriptors / Indicators: Aching;Sore Pain Intervention(s): Limited activity within patient's tolerance;Monitored during session;Ice applied;Patient requesting pain meds-RN notified    Home Living Family/patient expects to be discharged to:: Private residence Living Arrangements: Spouse/significant other Available Help at Discharge: Family Type of Home: House Home Access: Level entry   Home Layout: Two level;1/2 bath on main level;Bed/bath upstairs Home Equipment: [place] - single point;Crutches;Walker - 2 wheels;Bedside commode Additional Comments: Pt sleeps in recliner or "back chair"    Prior Function Level of Independence: Independent  PT Goals (current goals can now be found in the care plan section) Acute Rehab PT Goals Patient Stated Goal: Regain IND PT Goal Formulation: With patient Time For Goal Achievement: 04/18/19 Potential to Achieve Goals: Good Progress towards PT goals: Progressing toward goals    Frequency    7X/week      PT Plan Current plan remains appropriate     Co-evaluation              AM-PAC PT "6 Clicks" Mobility   Outcome Measure  Help needed turning from your back to your side while in a flat bed without using bedrails?: A Little Help needed moving from lying on your back to sitting on the side of a flat bed without using bedrails?: A Little Help needed moving to and from a bed to a chair (including a wheelchair)?: A Little Help needed standing up from a chair using your arms (e.g., wheelchair or bedside chair)?: A Little Help needed to walk in hospital room?: A Little Help needed climbing 3-5 steps with a railing? : A Little 6 Click Score: 18    End of Session Equipment Utilized During Treatment: Gait belt Activity Tolerance: Patient tolerated treatment well Patient left: in chair;with call bell/phone within reach;with family/visitor present Nurse Communication: Mobility status PT Visit Diagnosis: Difficulty in walking, not elsewhere classified (R26.2)     Time: 1100-1148 PT Time Calculation (min) (ACUTE ONLY): 48 min  Charges:  $Gait Training: 8-22 mins $Therapeutic Exercise: 8-22 mins $Therapeutic Activity: 8-22 mins                     Debe Coder PT Acute Rehabilitation Services Pager [phone] Office [phone]    [name] 04/11/2019, 12:33 PM

## 2019-04-12 ENCOUNTER — Encounter: Payer: Self-pay | Admitting: *Deleted

## 2019-04-12 ENCOUNTER — Other Ambulatory Visit: Payer: Self-pay | Admitting: *Deleted

## 2019-04-12 NOTE — Patient Outreach (Signed)
[name]) Care Management  04/12/2019  [name] 01/21/1966 [id number]  Transition of care call/case closure   Referral received: 04/04/19 Initial outreach:04/12/19 Insurance: [facility] plan    Subjective: Initial successful telephone call to patient's preferred number in order to complete transition of care assessment; 2 HIPAA identifiers verified. Explained purpose of call and completed transition of care assessment.  States he is slowly recovering ,  denies post-operative problems, says surgical incision with dressing in place and unremarkable. He states surgical pain well managed with prescribed medications. He states his appetite is good ,  denies bowel or bladder problems reports no bowel movement yet, discussed measure to resolve per discharge instructions.   He states that is wife is  assisting with his recovery. Patient discussed having rolling walker for use at home and tolerating doing his exercises per discharge instructions.  He  denies any ongoing health issues and says he  does not need a referral to one of the [facility] chronic disease management programs.  Patient asked his wife as he states she works with [employer] health as a Marine scientist, if he has [facility] indemnity plan she states that they did , provided contact number explaining that she would need to file a claim. He reports wife has made contact regarding FMLA during this time.  He states they use [facility] pharmacy at [facility].  He denies educational needs related to staying safe during the COVID 19 pandemic.    Objective:  Taijuan Stephens  was hospitalized at [facility] for Left hip bearing surface revision from 12/9-12/10/20   Comorbidities include: Psoriatic Arthritis  DDD lumbar , cervical ,  Primary osteoarthritis,left  total hip arthroplasty, hyperlipidemia.  He/She was discharged to home on 04/11/19  without the need for home health services or DME.   Assessment:  Patient  voices good understanding of all discharge instructions.  See transition of care flowsheet for assessment details.   Plan:  Reviewed [facility] discharge diagnosis of Left hip bearing surface revision  and discharge treatment plan using [facility] discharge instructions, assessing medication adherence, reviewing problems requiring provider notification, and discussing the importance of follow up with surgeon  as directed. Reviewed [facility]'s announcements that all [facility] members will receive the [employer] Lif[employer] Premium rate in 2021.    No ongoing care management needs identified so will close case to [facility] services and route successful outreach letter with [facility] pamphlet and 24 Hour Nurse Line Magnet to [facility] clinical pool to be mailed to patient's home address.    [name], RN, [facility] Management Coordinator  [phone]- Mobile [phone]- Toll Free Main Office

## 2019-04-12 NOTE — Discharge Summary (Signed)
Physician Discharge Summary   Patient ID: [name] MRN: [id number] DOB/AGE: 55-Sep-1967 55 y.o.  Admit date: 04/10/2019 Discharge date: 04/11/2019  Primary Diagnosis: Failed left total hip arthroplasty   Admission Diagnoses:  Past Medical History:  Diagnosis Date   Anemia    History of avascular necrosis of capital femoral epiphysis 2006, 2007   Bilateral Femoral Head   HLD (hyperlipidemia)    Pneumonia    twice   Psoriatic arthritis [facility])    Discharge Diagnoses:   Active Problems:   Failed total hip arthroplasty ([place])  Estimated body mass index is 34.5 kg/m as calculated from the following:   Height as of this encounter: 5' 10.8" (1.798 m).   Weight as of this encounter: 111.6 kg.  Procedure(s) (LRB): Left hip bearing surface vs total hip arthroplasty revision (Left)   Consults: None  HPI: [name], 53 y.o. male, has a history of pain and functional disability in the left hip due to bearing surface wear and metallosis and patient has failed non-surgical conservative treatments for greater than 12 weeks to include NSAID's and/or analgesics, flexibility and strengthening excercises and activity modification. The indications for the revision total hip arthroplasty are bearing surface wear leading to  symptomatic synovitis. Onset of symptoms was gradual starting 2 years ago with gradually worsening course since that time.  Prior procedures on the left hip include arthroplasty. Patient currently rates pain in the left hip at 4 out of 10 with activity.  There is worsening of pain with activity and weight bearing and pain that interfers with activities of daily living. Patient has no evidence of prosthetic loosening by imaging studies.  This condition presents safety issues increasing the risk of falls.   There is no current active infection.  Laboratory Data: Admission on 04/10/2019, Discharged on 04/11/2019  Component Date Value Ref Range Status   WBC 04/11/2019  9.2  4.0 - 10.5 K/uL Final   RBC 04/11/2019 5.07  4.22 - 5.81 MIL/uL Final   Hemoglobin 04/11/2019 15.9  13.0 - 17.0 g/dL Final   HCT 04/11/2019 47.2  39.0 - 52.0 % Final   MCV 04/11/2019 93.1  80.0 - 100.0 fL Final   MCH 04/11/2019 31.4  26.0 - 34.0 pg Final   MCHC 04/11/2019 33.7  30.0 - 36.0 g/dL Final   RDW 04/11/2019 12.0  11.5 - 15.5 % Final   Platelets 04/11/2019 120* 150 - 400 K/uL Final   nRBC 04/11/2019 0.0  0.0 - 0.2 % Final   Performed at [facility], [place] [address], [place], [place] [address]   Sodium 04/11/2019 138  135 - 145 mmol/L Final   Potassium 04/11/2019 4.3  3.5 - 5.1 mmol/L Final   Chloride 04/11/2019 106  98 - 111 mmol/L Final   CO2 04/11/2019 24  22 - 32 mmol/L Final   Glucose, Bld 04/11/2019 135* 70 - 99 mg/dL Final   BUN 04/11/2019 18  6 - 20 mg/dL Final   Creatinine, Ser 04/11/2019 0.96  0.61 - 1.24 mg/dL Final   Calcium 04/11/2019 8.9  8.9 - 10.3 mg/dL Final   GFR calc non Af Amer 04/11/2019 >60  >60 mL/min Final   GFR calc Af Amer 04/11/2019 >60  >60 mL/min Final   Anion gap 04/11/2019 8  5 - 15 Final   Performed at [facility], [place] [address] [place], [place] [address]  Hospital Outpatient Visit on 04/09/2019  Component Date Value Ref Range Status   MRSA,  PCR 04/09/2019 NEGATIVE  NEGATIVE Final   Staphylococcus aureus 04/09/2019 NEGATIVE  NEGATIVE Final   Comment: (NOTE) The Xpert SA Assay (FDA approved for NASAL specimens in patients 23 years of age and older), is one component of a comprehensive surveillance program. It is not intended to diagnose infection nor to guide or monitor treatment. Performed at [facility], [place] [address], [place], [place] [address]    aPTT 04/09/2019 29  24 - 36 seconds Final   Performed at [facility], [place] [address], [place], [place] [address]   WBC 04/09/2019 5.4  4.0 - 10.5 K/uL Final   RBC 04/09/2019 5.21   4.22 - 5.81 MIL/uL Final   Hemoglobin 04/09/2019 16.3  13.0 - 17.0 g/dL Final   HCT 04/09/2019 48.7  39.0 - 52.0 % Final   MCV 04/09/2019 93.5  80.0 - 100.0 fL Final   MCH 04/09/2019 31.3  26.0 - 34.0 pg Final   MCHC 04/09/2019 33.5  30.0 - 36.0 g/dL Final   RDW 04/09/2019 12.0  11.5 - 15.5 % Final   Platelets 04/09/2019 120* 150 - 400 K/uL Final   nRBC 04/09/2019 0.0  0.0 - 0.2 % Final   Neutrophils Relative % 04/09/2019 61  % Final   Neutro Abs 04/09/2019 3.3  1.7 - 7.7 K/uL Final   Lymphocytes Relative 04/09/2019 32  % Final   Lymphs Abs 04/09/2019 1.7  0.7 - 4.0 K/uL Final   Monocytes Relative 04/09/2019 5  % Final   Monocytes Absolute 04/09/2019 0.3  0.1 - 1.0 K/uL Final   Eosinophils Relative 04/09/2019 2  % Final   Eosinophils Absolute 04/09/2019 0.1  0.0 - 0.5 K/uL Final   Basophils Relative 04/09/2019 0  % Final   Basophils Absolute 04/09/2019 0.0  0.0 - 0.1 K/uL Final   Immature Granulocytes 04/09/2019 0  % Final   Abs Immature Granulocytes 04/09/2019 0.01  0.00 - 0.07 K/uL Final   Performed at [facility], [place] [address] [place], [place] [address]   Sodium 04/09/2019 140  135 - 145 mmol/L Final   Potassium 04/09/2019 4.2  3.5 - 5.1 mmol/L Final   Chloride 04/09/2019 106  98 - 111 mmol/L Final   CO2 04/09/2019 24  22 - 32 mmol/L Final   Glucose, Bld 04/09/2019 91  70 - 99 mg/dL Final   BUN 04/09/2019 22* 6 - 20 mg/dL Final   Creatinine, Ser 04/09/2019 0.98  0.61 - 1.24 mg/dL Final   Calcium 04/09/2019 9.4  8.9 - 10.3 mg/dL Final   Total Protein 04/09/2019 7.5  6.5 - 8.1 g/dL Final   Albumin 04/09/2019 4.3  3.5 - 5.0 g/dL Final   AST 04/09/2019 25  15 - 41 U/L Final   ALT 04/09/2019 35  0 - 44 U/L Final   Alkaline Phosphatase 04/09/2019 73  38 - 126 U/L Final   Total Bilirubin 04/09/2019 1.1  0.3 - 1.2 mg/dL Final   GFR calc non Af Amer 04/09/2019 >60  >60 mL/min Final   GFR calc Af Amer 04/09/2019 >60  >60 mL/min  Final   Anion gap 04/09/2019 10  5 - 15 Final   Performed at [facility], [place] [address], [place], [place] [address]   Prothrombin Time 04/09/2019 12.2  11.4 - 15.2 seconds Final   INR 04/09/2019 0.9  0.8 - 1.2 Final   Comment: (NOTE) INR goal varies based on device and disease states. Performed at [facility], [place] [address], [place], [place] [address]    ABO/RH(D) 04/09/2019 O POS   Final   Antibody Screen 04/09/2019 NEG   Final   Sample Expiration 04/09/2019 04/13/2019,2359   Final   Extend sample reason 04/09/2019    Final                   Value:NO TRANSFUSIONS OR PREGNANCY IN THE PAST 3 MONTHS Performed at [place] [address] [place], [place] [address]    ABO/RH(D) 04/09/2019 O POS   Final  Hospital Outpatient Visit on 04/06/2019  Component Date Value Ref Range Status   SARS-CoV-2, NAA 04/06/2019 NOT DETECTED  NOT DETECTED Final   Comment: (NOTE) This nucleic acid amplification test was developed and its performance characteristics determined by [employer]. Nucleic acid amplification tests include PCR and TMA. This test has not been FDA cleared or approved. This test has been authorized by FDA under an Emergency Use Authorization (EUA). This test is only authorized for the duration of time the declaration that circumstances exist justifying the authorization of the emergency use of in vitro diagnostic tests for detection of SARS-CoV-2 virus and/or diagnosis of COVID-19 infection under section 564(b)(1) of the Act, 21 U.S.C. [id number]) (1), unless the authorization is terminated or revoked sooner. When diagnostic testing is negative, the possibility of a false negative result should be considered in the context of a patient's recent exposures and the presence of clinical signs and symptoms consistent with COVID-19. An individual without symptoms of COVID- 19 and who is not shedding  SARS-CoV-2 vi                          rus would expect to have a negative (not detected) result in this assay. Performed At: [facility] [place], [place] [id number] Rush Farmer MD [id number]    Coronavirus Source 04/06/2019 NASOPHARYNGEAL   Final   Performed at [place] [address] [place], [place] [address]  Orders Only on 03/15/2019  Component Date Value Ref Range Status   SARS-CoV-2, NAA 03/15/2019 Not Detected  Not Detected Final   Comment: This nucleic acid amplification test was developed and its performance characteristics determined by [employer]. Nucleic acid amplification tests include PCR and TMA. This test has not been FDA cleared or approved. This test has been authorized by FDA under an Emergency Use Authorization (EUA). This test is only authorized for the duration of time the declaration that circumstances exist justifying the authorization of the emergency use of in vitro diagnostic tests for detection of SARS-CoV-2 virus and/or diagnosis of COVID-19 infection under section 564(b)(1) of the Act, 21 U.S.C. [id number]) (1), unless the authorization is terminated or revoked sooner. When diagnostic testing is negative, the possibility of a false negative result should be considered in the context of a patient's recent exposures and the presence of clinical signs and symptoms consistent with COVID-19. An individual without symptoms of COVID-19 and who is not shedding SARS-CoV-2 virus would                           expect to have a negative (not detected) result in this assay.      X-Rays:DG Pelvis Portable  Result Date: 04/10/2019 CLINICAL DATA:  Revision of left total hip arthroplasty, likely acetabular liner and femoral head revision. EXAM: PORTABLE PELVIS 1-2 VIEWS COMPARISON:  11/23/2016 FINDINGS: Bilateral  hip prostheses are intact. No findings for periprosthetic fracture. 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 The bony pelvis is intact. The pubic symphysis and SI joints appear normal. IMPRESSION: 1. Intact bilateral hip prostheses. 2. 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 3. No acute bony findings. Electronically Signed   By: Marijo Sanes M.D.   On: 04/10/2019 16:22    EKG: Orders placed or performed during the hospital encounter of 04/09/19   EKG   EKG     Hospital Course: Patient was admitted to [facility] and taken to the OR and underwent the above state procedure without complications.  Patient tolerated the procedure well and was later transferred to the recovery room and then to the orthopaedic floor for postoperative care.  They were given PO and IV analgesics for pain control following their surgery.  They were given 24 hours of postoperative antibiotics of  Anti-infectives (From admission, onward)   Start     Dose/Rate Route Frequency Ordered Stop   04/10/19 2000  ceFAZolin (ANCEF) IVPB 2g/100 mL premix     2 g 200 mL/hr over 30 Minutes Intravenous Every 6 hours 04/10/19 1800 04/11/19 0318   04/10/19 1130  ceFAZolin (ANCEF) IVPB 2g/100 mL premix     2 g 200 mL/hr over 30 Minutes Intravenous On call to O.R. 04/10/19 1126 04/10/19 1336     and started on DVT prophylaxis in the form of Aspirin.   PT and OT were ordered for total hip protocol.  The patient was allowed to be WBAT with therapy. Discharge planning was consulted to help with postop disposition and equipment needs.  Patient had a good night on the evening of surgery.  They started to get up OOB with therapy on day one.  Hemovac drain was pulled without difficulty.  The knee immobilizer was removed and discontinued. Tthe patient had progressed with therapy and meeting their goals.  Incision was healing well.  Patient was seen in rounds and was ready to go home.   Diet: Cardiac diet Activity:WBAT No bending hip  over 90 degrees- A "L" Angle Do not cross legs Do not let foot roll inward When turning these patients a pillow should be placed between the patient's legs to prevent crossing. Patients should have the affected knee fully extended when trying to sit or stand from all surfaces to prevent excessive hip flexion. When ambulating and turning toward the affected side the affected leg should have the toes turned out prior to moving the walker and the rest of patient's body as to prevent internal rotation/ turning in of the leg. Abduction pillows are the most effective way to prevent a patient from not crossing legs or turning toes in at rest. If an abduction pillow is not ordered placing a regular pillow length wise between the patient's legs is also an effective reminder. It is imperative that these precautions be maintained so that the surgical hip does not dislocate. Follow-up:in 2 weeks Disposition - Home Discharged Condition: stable   Discharge Instructions    Call MD / Call 911   Complete by: As directed    If you experience chest pain or shortness of breath, CALL 911 and be transported to the hospital emergency room.  If you develope a fever above 101 F, pus (white drainage) or increased drainage or redness at the wound, or calf pain, call your surgeon's office.   Constipation Prevention   Complete  by: As directed    Drink plenty of fluids.  Prune juice may be helpful.  You may use a stool softener, such as Colace (over the counter) 100 mg twice a day.  Use MiraLax (over the counter) for constipation as needed.   Diet - low sodium heart healthy   Complete by: As directed    Discharge instructions   Complete by: As directed    Dr. [name] Total Joint Specialist [facility] [address] [place], [place] [address] [phone]  POSTERIOR TOTAL HIP REVISION POSTOPERATIVE DIRECTIONS  Hip Rehabilitation, Guidelines Following Surgery  The results of a hip operation are  greatly improved after range of motion and muscle strengthening exercises. Follow all safety measures which are given to protect your hip. If any of these exercises cause increased pain or swelling in your joint, decrease the amount until you are comfortable again. Then slowly increase the exercises. Call your caregiver if you have problems or questions.   HOME CARE INSTRUCTIONS  Remove items at home which could result in a fall. This includes throw rugs or furniture in walking pathways.  ICE to the affected hip every three hours for 30 minutes at a time and then as needed for pain and swelling.  Continue to use ice on the hip for pain and swelling from surgery. You may notice swelling that will progress down to the foot and ankle.  This is normal after surgery.  Elevate the leg when you are not up walking on it.   Continue to use the breathing machine which will help keep your temperature down.  It is common for your temperature to cycle up and down following surgery, especially at night when you are not up moving around and exerting yourself.  The breathing machine keeps your lungs expanded and your temperature down.  DIET You may resume your previous home diet once your are discharged from the hospital.  DRESSING / WOUND CARE / SHOWERING You may change your dressing 3-5 days after surgery.  Then change the dressing every day with sterile gauze.  Please use good hand washing techniques before changing the dressing.  Do not use any lotions or creams on the incision until instructed by your surgeon. You may start showering once you are discharged home but do not submerge the incision under water. Just pat the incision dry and apply a dry gauze dressing on daily. Change the surgical dressing daily and reapply a dry dressing each time.  ACTIVITY Walk with your walker as instructed. Use walker as long as suggested by your caregivers. Avoid periods of inactivity such as sitting longer than an hour when  not asleep. This helps prevent blood clots.  You may resume a sexual relationship in one month or when given the OK by your doctor.  You may return to work once you are cleared by your doctor.  Do not drive a car for 6 weeks or until released by you surgeon.  Do not drive while taking narcotics.  WEIGHT BEARING Weight bearing as tolerated with assist device (walker, cane, etc) as directed, use it as long as suggested by your surgeon or therapist, typically at least 4-6 weeks.  POSTOPERATIVE CONSTIPATION PROTOCOL Constipation - defined medically as fewer than three stools per week and severe constipation as less than one stool per week.  One of the most common issues patients have following surgery is constipation.  Even if you have a regular bowel pattern at home, your normal regimen is  likely to be disrupted due to multiple reasons following surgery.  Combination of anesthesia, postoperative narcotics, change in appetite and fluid intake all can affect your bowels.  In order to avoid complications following surgery, here are some recommendations in order to help you during your recovery period.  Colace (docusate) - Pick up an over-the-counter form of Colace or another stool softener and take twice a day as long as you are requiring postoperative pain medications.  Take with a full glass of water daily.  If you experience loose stools or diarrhea, hold the colace until you stool forms back up.  If your symptoms do not get better within 1 week or if they get worse, check with your doctor.  Dulcolax (bisacodyl) - Pick up over-the-counter and take as directed by the product packaging as needed to assist with the movement of your bowels.  Take with a full glass of water.  Use this product as needed if not relieved by Colace only.   MiraLax (polyethylene glycol) - Pick up over-the-counter to have on hand.  MiraLax is a solution that will increase the amount of water in your bowels to assist with bowel  movements.  Take as directed and can mix with a glass of water, juice, soda, coffee, or tea.  Take if you go more than two days without a movement. Do not use MiraLax more than once per day. Call your doctor if you are still constipated or irregular after using this medication for 7 days in a row.  If you continue to have problems with postoperative constipation, please contact the office for further assistance and recommendations.  If you experience "the worst abdominal pain ever" or develop nausea or vomiting, please contact the office immediatly for further recommendations for treatment.  ITCHING  If you experience itching with your medications, try taking only a single pain pill, or even half a pain pill at a time.  You can also use Benadryl over the counter for itching or also to help with sleep.   TED HOSE STOCKINGS Wear the elastic stockings on both legs for three weeks following surgery during the day but you may remove then at night for sleeping.  MEDICATIONS See your medication summary on the "After Visit Summary" that the nursing staff will review with you prior to discharge.  You may have some home medications which will be placed on hold until you complete the course of blood thinner medication.  It is important for you to complete the blood thinner medication as prescribed by your surgeon.  Continue your approved medications as instructed at time of discharge.  PRECAUTIONS If you experience chest pain or shortness of breath - call 911 immediately for transfer to the hospital emergency department.  If you develop a fever greater that 101 F, purulent drainage from wound, increased redness or drainage from wound, foul odor from the wound/dressing, or calf pain - CONTACT YOUR SURGEON.                                                   FOLLOW-UP APPOINTMENTS Make sure you keep all of your appointments after your operation with your surgeon and caregivers. You should call the office at the  above phone number and make an appointment for approximately two weeks after the date of your surgery or on the date instructed by  your surgeon outlined in the "After Visit Summary".  RANGE OF MOTION AND STRENGTHENING EXERCISES  These exercises are designed to help you keep full movement of your hip joint. Follow your caregiver's or physical therapist's instructions. Perform all exercises about fifteen times, three times per day or as directed. Exercise both hips, even if you have had only one joint replacement. These exercises can be done on a training (exercise) mat, on the floor, on a table or on a bed. Use whatever works the best and is most comfortable for you. Use music or television while you are exercising so that the exercises are a pleasant break in your day. This will make your life better with the exercises acting as a break in routine you can look forward to.  Lying on your back, slowly slide your foot toward your buttocks, raising your knee up off the floor. Then slowly slide your foot back down until your leg is straight again.  Lying on your back spread your legs as far apart as you can without causing discomfort.  Lying on your side, raise your upper leg and foot straight up from the floor as far as is comfortable. Slowly lower the leg and repeat.  Lying on your back, tighten up the muscle in the front of your thigh (quadriceps muscles). You can do this by keeping your leg straight and trying to raise your heel off the floor. This helps strengthen the largest muscle supporting your knee.  Lying on your back, tighten up the muscles of your buttocks both with the legs straight and with the knee bent at a comfortable angle while keeping your heel on the floor.   IF YOU ARE TRANSFERRED TO A SKILLED REHAB FACILITY If the patient is transferred to a skilled rehab facility following release from the hospital, a list of the current medications will be sent to the facility for the patient to  continue.  When discharged from the skilled rehab facility, please have the facility set up the patient's [place] prior to being released. Also, the skilled facility will be responsible for providing the patient with their medications at time of release from the facility to include their pain medication, the muscle relaxants, and their blood thinner medication. If the patient is still at the rehab facility at time of the two week follow up appointment, the skilled rehab facility will also need to assist the patient in arranging follow up appointment in our office and any transportation needs.  MAKE SURE YOU:  Understand these instructions.  Get help right away if you are not doing well or get worse.    Pick up stool softner and laxative for home use following surgery while on pain medications. Do not submerge incision under water. Please use good hand washing techniques while changing dressing each day. May shower starting three days after surgery. Please use a clean towel to pat the incision dry following showers. Continue to use ice for pain and swelling after surgery. Do not use any lotions or creams on the incision until instructed by your surgeon.   Follow the hip precautions as taught in Physical Therapy   Complete by: As directed    Increase activity slowly as tolerated   Complete by: As directed      Allergies as of 04/11/2019      Reactions   Gold-containing Drug Products Rash   Denies oral or airway involvement - occurred in the 1980s.       Medication  List    STOP taking these medications   benzonatate 100 MG capsule Commonly known as: TESSALON   ibuprofen 200 MG tablet Commonly known as: ADVIL     TAKE these medications   acetaminophen 500 MG tablet Commonly known as: TYLENOL Take 1,000 mg by mouth daily.   aspirin 325 MG EC tablet Take 1 tablet (325 mg total) by mouth 2 (two) times daily.   cholecalciferol 1000 units tablet Commonly known  as: VITAMIN D Take 2,000 Units by mouth daily.   CoQ10 100 MG Caps Take 100 mg by mouth daily. *liquid*   Enbrel 50 MG/ML injection Generic drug: etanercept Inject 1 syringe into the skin once a week What changed:   how much to take  how to take this  when to take this  additional instructions   Fish Oil Ultra 1400 MG Caps Take 1,400 mg by mouth daily.   fluticasone 50 MCG/ACT nasal spray Commonly known as: FLONASE Place 1 spray into both nostrils 2 (two) times daily.   HYDROcodone-acetaminophen 5-325 MG tablet Commonly known as: NORCO/VICODIN Take 1-2 tablets by mouth every 6 (six) hours as needed for severe pain.   levocetirizine 5 MG tablet Commonly known as: XYZAL Take 5 mg by mouth daily.   Mag-200 200 MG Tabs Generic drug: Magnesium Oxide Take 200 mg by mouth daily.   Melatonin 5 MG Caps Take 5 mg by mouth at bedtime as needed (sleep.).   methocarbamol 500 MG tablet Commonly known as: ROBAXIN Take 1 tablet (500 mg total) by mouth every 6 (six) hours as needed for muscle spasms.   omeprazole 20 MG capsule Commonly known as: PRILOSEC Take 20 mg by mouth daily.   traMADol 50 MG tablet Commonly known as: ULTRAM Take 1-2 tablets (50-100 mg total) by mouth every 6 (six) hours as needed for moderate pain.   TURMERIC PO Take 1,000 mg by mouth daily. *Liquid-3 teaspoonful*      Follow-up Information    [name], MD. Schedule an appointment as soon as possible for a visit on 04/23/2019.   Specialty: Orthopedic Surgery Contact information: [address] [place] [address] [id number]           Signed: [name], PA-C Orthopaedic Surgery 04/12/2019, 8:18 AM

## 2019-04-15 MED FILL — ENBREL 50 MG/ML SOSY: 50 | 28 days supply | Qty: 4 | Fill #2

## 2019-04-23 ENCOUNTER — Ambulatory Visit: Payer: 59 | Admitting: Rheumatology

## 2019-04-29 DIAGNOSIS — Z96642 Presence of left artificial hip joint: Secondary | ICD-10-CM | POA: Diagnosis not present

## 2019-04-29 DIAGNOSIS — Z471 Aftercare following joint replacement surgery: Secondary | ICD-10-CM | POA: Diagnosis not present

## 2019-04-29 MED FILL — traMADol HCL 50 MG TABS: 50 | 7 days supply | Qty: 30 | Fill #0

## 2019-05-08 ENCOUNTER — Other Ambulatory Visit: Payer: Self-pay | Admitting: Rheumatology

## 2019-05-08 ENCOUNTER — Other Ambulatory Visit: Payer: Self-pay | Admitting: Pharmacist

## 2019-05-08 MED ORDER — ENBREL 50 MG/ML ~~LOC~~ SOSY: 50.0000 mg | PREFILLED_SYRINGE | SUBCUTANEOUS | 0 refills | Status: DC

## 2019-05-08 NOTE — Telephone Encounter (Signed)
Last Visit: 11/20/18 Next Visit: 06/04/19 Labs: 04/09/19 platelets 120 BUN 22 TB Gold: 01/04/19 Neg    Okay to refill per Dr. Estanislado Pandy

## 2019-05-12 ENCOUNTER — Encounter: Payer: Self-pay | Admitting: Family Medicine

## 2019-05-13 ENCOUNTER — Telehealth: Payer: Self-pay | Admitting: Pharmacy Technician

## 2019-05-13 ENCOUNTER — Other Ambulatory Visit: Payer: Self-pay

## 2019-05-13 DIAGNOSIS — L0591 Pilonidal cyst without abscess: Secondary | ICD-10-CM

## 2019-05-13 NOTE — Telephone Encounter (Signed)
Submitted a Urgent Prior Authorization request to [facility] for ENBREL via Cover My Meds. Will update once we receive a response.

## 2019-05-14 MED FILL — ENBREL 50 MG/ML SOSY: 50 | 28 days supply | Qty: 4 | Fill #0

## 2019-05-14 NOTE — Telephone Encounter (Signed)
Received notification from [facility] regarding a prior authorization for ENBREL. Authorization has been APPROVED from 05/13/19 to 05/11/20.   Will send document to scan center.

## 2019-05-28 ENCOUNTER — Other Ambulatory Visit: Payer: Self-pay | Admitting: *Deleted

## 2019-05-28 DIAGNOSIS — Z79899 Other long term (current) drug therapy: Secondary | ICD-10-CM

## 2019-05-29 LAB — CBC WITH DIFFERENTIAL/PLATELET
Absolute Monocytes: 207 cells/uL (ref 200–950)
Basophils Absolute: 28 cells/uL (ref 0–200)
Basophils Relative: 0.6 %
Eosinophils Absolute: 61 cells/uL (ref 15–500)
Eosinophils Relative: 1.3 %
HCT: 46.5 % (ref 38.5–50.0)
Hemoglobin: 15.5 g/dL (ref 13.2–17.1)
Lymphs Abs: 1556 cells/uL (ref 850–3900)
MCH: 31.2 pg (ref 27.0–33.0)
MCHC: 33.3 g/dL (ref 32.0–36.0)
MCV: 93.6 fL (ref 80.0–100.0)
MPV: 11.5 fL (ref 7.5–12.5)
Monocytes Relative: 4.4 %
Neutro Abs: 2848 cells/uL (ref 1500–7800)
Neutrophils Relative %: 60.6 %
Platelets: 151 10*3/uL (ref 140–400)
RBC: 4.97 10*6/uL (ref 4.20–5.80)
RDW: 13 % (ref 11.0–15.0)
Total Lymphocyte: 33.1 %
WBC: 4.7 10*3/uL (ref 3.8–10.8)

## 2019-05-29 LAB — COMPLETE METABOLIC PANEL WITH GFR
AG Ratio: 1.8 (calc) (ref 1.0–2.5)
ALT: 23 U/L (ref 9–46)
AST: 19 U/L (ref 10–35)
Albumin: 4.4 g/dL (ref 3.6–5.1)
Alkaline phosphatase (APISO): 79 U/L (ref 35–144)
BUN: 20 mg/dL (ref 7–25)
CO2: 27 mmol/L (ref 20–32)
Calcium: 9.2 mg/dL (ref 8.6–10.3)
Chloride: 105 mmol/L (ref 98–110)
Creat: 0.91 mg/dL (ref 0.70–1.33)
GFR, Est African American: 110 mL/min/{1.73_m2} (ref >=60)
GFR, Est Non African American: 95 mL/min/{1.73_m2} (ref >=60)
Globulin: 2.5 g/dL (calc) (ref 1.9–3.7)
Glucose, Bld: 85 mg/dL (ref 65–99)
Potassium: 4.4 mmol/L (ref 3.5–5.3)
Sodium: 139 mmol/L (ref 135–146)
Total Bilirubin: 0.6 mg/dL (ref 0.2–1.2)
Total Protein: 6.9 g/dL (ref 6.1–8.1)

## 2019-06-03 ENCOUNTER — Encounter: Payer: Self-pay | Admitting: Family Medicine

## 2019-06-03 NOTE — Progress Notes (Signed)
Office Visit Note  Patient: [name]             Date of Birth: [birth year]           MRN: [id number]             PCP: Eulas Post, MD Referring: Eulas Post, MD Visit Date: 06/11/2019 Occupation: @GUAROCC @  Subjective:  Left hip pain   History of Present Illness: [name] is a 54 y.o. male with history of psoriatic arthritis, osteoarthritis, and DDD.  He is on Enbrel 50 mg sq injections once weekly.  He denies any recent psoriatic arthritis flares.  He denies any achilles tendonitis or plantar fasciitis.  He denies any SI joint pain.  He denies any active psoriasis.  He had a left hip replacement revision on 04/10/19 performed by Dr. Wynelle Link.  He has been experiencing increased pain in both feet.  He denies any joint swelling.   Activities of Daily Living:  Patient reports morning stiffness for 1 hour.   Patient Reports nocturnal pain.  Difficulty dressing/grooming: Denies Difficulty climbing stairs: Reports Difficulty getting out of chair: Denies Difficulty using hands for taps, buttons, cutlery, and/or writing: Denies  Review of Systems  Constitutional: Positive for fatigue. Negative for night sweats.  HENT: Negative for mouth sores, mouth dryness and nose dryness.   Eyes: Negative for redness and dryness.  Respiratory: Negative for cough, hemoptysis, shortness of breath and difficulty breathing.   Cardiovascular: Negative for chest pain, palpitations, hypertension, irregular heartbeat and swelling in legs/feet.  Gastrointestinal: Negative for constipation and diarrhea.  Endocrine: Negative for increased urination.  Genitourinary: Negative for painful urination.  Musculoskeletal: Positive for arthralgias, joint pain and morning stiffness. Negative for joint swelling, myalgias, muscle weakness, muscle tenderness and myalgias.  Skin: Negative for color change, rash, hair loss, nodules/bumps, skin tightness, ulcers and sensitivity to sunlight.    Allergic/Immunologic: Negative for susceptible to infections.  Neurological: Negative for dizziness, fainting, memory loss, night sweats and weakness.  Hematological: Negative for swollen glands.  Psychiatric/Behavioral: Negative for depressed mood and sleep disturbance. The patient is not nervous/anxious.     PMFS History:  Patient Active Problem List   Diagnosis Date Noted  . Essential tremor 04/03/2017  . Elevated blood pressure reading 02/03/2017  . 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 High risk medications (not anticoagulants) long-term use 09/20/2016  . Bilateral hand pain 09/20/2016  . DDD (degenerative disc disease), cervical 09/20/2016  . History of total hip replacement, bilateral 09/20/2016  . DDD (degenerative disc disease), lumbar 09/20/2016  . Bilateral plantar fasciitis 09/20/2016  . Psoriasis 09/20/2016  . Primary osteoarthritis of both feet 09/20/2016  . Primary osteoarthritis of both hands 09/20/2016  . Primary osteoarthritis of both knees 09/20/2016  . Prediabetes 09/22/2014  . Hyperlipidemia 09/22/2014  . URI (upper respiratory infection) 07/07/2012  . Acute bronchitis 07/07/2012  . HYPERLIPIDEMIA 07/03/2008  . OBESITY 07/03/2008  . AVASCULAR NECROSIS, FEMORAL HEAD 07/03/2008  . Psoriatic arthritis ([place]) 05/02/2001    Past Medical History:  Diagnosis Date  . Anemia   . History of avascular necrosis of capital femoral epiphysis 2006, 2007   Bilateral Femoral Head  . HLD (hyperlipidemia)   . Pneumonia    twice  . Psoriatic arthritis ([place])     Family History  Problem Relation Age of Onset  . Pneumonia Father   . Tremor Neg Hx    Past Surgical History:  Procedure Laterality Date  . JOINT REPLACEMENT Bilateral  2006, 2007   Necrosis femoral head (bilateral)  . TOTAL HIP REVISION Right 11/23/2016   Procedure: Acetabulum liner and femoral head revision;  Surgeon: [name], MD;  Location: [facility];  Service: Orthopedics;  Laterality:  Right;  . TOTAL HIP REVISION Left 04/10/2019   Procedure: Left hip bearing surface vs total hip arthroplasty revision;  Surgeon: [name], MD;  Location: [facility];  Service: Orthopedics;  Laterality: Left;  175min   Social History   Social History Narrative   Lives at home w/ his wife   Right-handed   Caffeine: 2-3 cups per day   Immunization History  Administered Date(s) Administered  . Influenza,inj,Quad PF,6+ Mos 02/03/2017, 03/21/2018  . Pneumococcal Conjugate-13 03/21/2018  . Tdap 09/22/2014  . Zoster Recombinat (Shingrix) 04/02/2018, 05/23/2018     Objective: Vital Signs: BP (!) 150/81 (BP Location: Left Arm, Patient Position: Sitting, Cuff Size: Large)   Resp 13   Ht 5\' 9"  (1.753 m)   Wt 253 lb 6.4 oz (114.9 kg)   BMI 37.42 kg/m    Physical Exam Vitals and nursing note reviewed.  Constitutional:      Appearance: He is well-developed.  HENT:     Head: Normocephalic and atraumatic.  Eyes:     Conjunctiva/sclera: Conjunctivae normal.     Pupils: Pupils are equal, round, and reactive to light.  Cardiovascular:     Rate and Rhythm: Normal rate and regular rhythm.     Heart sounds: Normal heart sounds.  Pulmonary:     Effort: Pulmonary effort is normal.     Breath sounds: Normal breath sounds.  Abdominal:     General: Bowel sounds are normal.     Palpations: Abdomen is soft.  Musculoskeletal:     Cervical back: Normal range of motion and neck supple.  Skin:    General: Skin is warm and dry.     Capillary Refill: Capillary refill takes less than 2 seconds.  Neurological:     Mental Status: He is alert and oriented to person, place, and time.  Psychiatric:        Behavior: Behavior normal.      Musculoskeletal Exam: C-spine limited ROM.  Thoracic and lumbar spine good ROM.  Shoulder joints, elbow joints, MCPs, PIPs, and DIPs good ROM with no synovitis. Complete fist formation bilaterally.  Limited extension of both wrist joints.  Limited flexion of left  wrist. Contracture of left 4th PIP joint.  PIP and DIP synovial thickening consistent with osteoarthritis of both hands. Right hip replacement good ROM with no discomfort.  Left hip replacement limited ROM. Knee joints good ROM with no warmth or effusion.  Ankle joints good ROM with no tenderness or synovitis.    CDAI Exam: CDAI Score: -- Patient Global: --; Provider Global: -- Swollen: --; Tender: -- Joint Exam 06/11/2019   No joint exam has been documented for this visit   There is currently no information documented on the homunculus. Go to the Rheumatology activity and complete the homunculus joint exam.  Investigation: No additional findings.  Imaging: No results found.  Recent Labs: Lab Results  Component Value Date   WBC 4.7 05/28/2019   HGB 15.5 05/28/2019   PLT 151 05/28/2019   NA 139 05/28/2019   K 4.4 05/28/2019   CL 105 05/28/2019   CO2 27 05/28/2019   GLUCOSE 85 05/28/2019   BUN 20 05/28/2019   CREATININE 0.91 05/28/2019   BILITOT 0.6 05/28/2019   ALKPHOS 73 04/09/2019   AST 19  05/28/2019   ALT 23 05/28/2019   PROT 6.9 05/28/2019   ALBUMIN 4.3 04/09/2019   CALCIUM 9.2 05/28/2019   GFRAA 110 05/28/2019   QFTBGOLDPLUS NEGATIVE 01/04/2019    Speciality Comments: No specialty comments available.  Procedures:  No procedures performed Allergies: Gold-containing drug products      Assessment / Plan:     Visit Diagnoses: Psoriatic arthritis ([place]): He has no synovitis or dactylitis on exam.  He has not had any recent psoriatic arthritis flares.  He is clinically doing well on Enbrel 50 mg subcutaneous injections once weekly.  He has not had any Achilles tendinitis or plantar fasciitis.  He has no SI joint tenderness on exam today.  He has no active psoriasis at this time.  He has been experiencing some intermittent discomfort in both feet does not have any joint swelling.  He will continue on Enbrel 50 mg having his injections once weekly.  He was advised to  notify us if he develops increased joint pain or joint swelling.  He will follow-up in the office in 5 months.  Psoriasis: He has no active psoriasis at this time.   High risk medication use - Enbrel 50 mg sq injections every 7 days. CBC and CMP WNL on 05/28/19.  TB gold negative on 01/04/19.  He will return in April and every 3 months for CBC and CMP to monitor for drug toxicity.  Standing orders are in place.  Primary osteoarthritis of both hands:  PIP and DIP synovial thickening consistent with osteoarthritis of both hands. He has a contracture of the left 4th PIP joint.  He has no tenderness or synovitis on exam.  Joint protection and muscle strengthening were discussed.   History of total hip replacement, bilateral: He had a left hip revision performed by Dr. Wynelle Link on 04/10/2019.  He has limited range of motion with some discomfort on exam today.  His right hip replacement has good range of motion with no discomfort at this time.  Primary osteoarthritis of both knees: He has good range of motion of bilateral knee joints.  No warmth or effusion was noted.  Primary osteoarthritis of both feet: He has intermittent pain in both feet.  He has no joint swelling.  He wears proper fitting shoes.  DDD (degenerative disc disease), cervical: He has limited ROM of the C-spine on exam.  He has no symptoms of radiculopathy at this time.   DDD (degenerative disc disease), lumbar: He has good ROM of the lumbar spine  Other fatigue: Chronic but stable.   History of vitamin D deficiency: He is taking vitamin D 2000 units by mouth daily  Other medical conditions are listed as follows:  History of hypertension  History of hyperlipidemia  History of prediabetes  Orders: No orders of the defined types were placed in this encounter.  No orders of the defined types were placed in this encounter.   Follow-Up Instructions: Return in about 5 months (around 11/08/2019) for Psoriatic arthritis.   Ofilia Neas, PA-C   I examined and evaluated the patient with Hazel Sams PA.  Patient continues to complain of generalized achiness and joint pain.  On my examination I did not notice any synovitis.  He does have limited range of motion in multiple joints due to underlying psoriatic arthritis.  No active psoriasis lesions were noted.  No synovitis was noted.  I provided detailed counseling to the patient that he will continue to have some long-term discomfort due to underlying  arthritis.  He is gradually recovering from the left hip joint revision but is still have some discomfort.  The plan of care was discussed as noted above.  Bo Merino, MD  Note - This record has been created using Editor, commissioning.  Chart creation errors have been sought, but may not always  have been located. Such creation errors do not reflect on  the standard of medical care.

## 2019-06-04 ENCOUNTER — Ambulatory Visit: Payer: 59 | Admitting: Rheumatology

## 2019-06-06 ENCOUNTER — Ambulatory Visit: Payer: Self-pay | Admitting: Surgery

## 2019-06-06 DIAGNOSIS — L0591 Pilonidal cyst without abscess: Secondary | ICD-10-CM | POA: Diagnosis not present

## 2019-06-06 MED FILL — AMOXICILLIN 500 MG CAPSULE: 500 | 3 days supply | Qty: 12 | Fill #0

## 2019-06-06 NOTE — H&P (Signed)
Surgical H&P  Chief Complaint: pilonidal cyst  HPI: This is a very pleasant 54 year old man with a chief complaint of pilonidal disease.  Has noted a cyst in the area of his tailbone which has been present for quite some time.  He states his wife believes that he had a cyst removed at some point in the remote past.  He does not recall how long it has been there this time, however he noted that he had a several days and she weeks ago where there was significant pain in this area.  Because of issues with his joints, he sleeps in a recliner and noted that the pressure on his back was pretty much intolerable.  A few days ago he did note some drainage on his parents from this area.  He does not recall any any prior infection in this area or needing to be treated with antibiotics.  Allergies  Allergen Reactions   Gold-Containing Drug Products Rash    Denies oral or airway involvement - occurred in the 1980s.     Past Medical History:  Diagnosis Date   Anemia    History of avascular necrosis of capital femoral epiphysis 2006, 2007   Bilateral Femoral Head   HLD (hyperlipidemia)    Pneumonia    twice   Psoriatic arthritis [facility])     Past Surgical History:  Procedure Laterality Date   JOINT REPLACEMENT Bilateral 2006, 2007   Necrosis femoral head (bilateral)   TOTAL HIP REVISION Right 11/23/2016   Procedure: Acetabulum liner and femoral head revision;  Surgeon: [name], MD;  Location: [facility];  Service: Orthopedics;  Laterality: Right;   TOTAL HIP REVISION Left 04/10/2019   Procedure: Left hip bearing surface vs total hip arthroplasty revision;  Surgeon: [name], MD;  Location: [facility];  Service: Orthopedics;  Laterality: Left;  155min    Family History  Problem Relation Age of Onset   Pneumonia Father    Tremor Neg Hx     Social History   Socioeconomic History   Marital status: Married    Spouse name: Not on file   Number of children: 1   Years of education: Not on  file   Highest education level: Not on file  Occupational History   Occupation: Disabled  Tobacco Use   Smoking status: Never Smoker   Smokeless tobacco: Never Used  Substance and Sexual Activity   Alcohol use: Yes    Alcohol/week: 0.0 standard drinks    Comment: 3-4 drinks per week   Drug use: No   Sexual activity: Not on file    Comment: Married  Other Topics Concern   Not on file  Social History Narrative   Lives at home w/ his wife   Right-handed   Caffeine: 2-3 cups per day   Social Determinants of Health   Financial Resource Strain:    Difficulty of Paying Living Expenses: Not on file  Food Insecurity:    Worried About Charity fundraiser in the Last Year: Not on file   [employer] of Food in the Last Year: Not on file  Transportation Needs:    Lack of Transportation (Medical): Not on file   Lack of Transportation (Non-Medical): Not on file  Physical Activity:    Days of Exercise per Week: Not on file   Minutes of Exercise per Session: Not on file  Stress:    Feeling of Stress : Not on file  Social Connections:    Frequency of Communication  with Friends and Family: Not on file   Frequency of Social Gatherings with Friends and Family: Not on file   Attends Religious Services: Not on file   Active Member of Clubs or Organizations: Not on file   Attends Archivist Meetings: Not on file   Marital Status: Not on file    Current Outpatient Medications on File Prior to Visit  Medication Sig Dispense Refill   acetaminophen (TYLENOL) 500 MG tablet Take 1,000 mg by mouth daily.      aspirin EC 325 MG EC tablet Take 1 tablet (325 mg total) by mouth 2 (two) times daily. 40 tablet 0   cholecalciferol (VITAMIN D) 1000 units tablet Take 2,000 Units by mouth daily.      Coenzyme Q10 (COQ10) 100 MG CAPS Take 100 mg by mouth daily. *liquid*     etanercept (ENBREL) 50 MG/ML injection Inject 0.98 mLs (50 mg total) into the skin once a week. Inject 1 syringe into the skin  once a week. 12 mL 0   fluticasone (FLONASE) 50 MCG/ACT nasal spray Place 1 spray into both nostrils 2 (two) times daily.     HYDROcodone-acetaminophen (NORCO/VICODIN) 5-325 MG tablet Take 1-2 tablets by mouth every 6 (six) hours as needed for severe pain. 40 tablet 0   levocetirizine (XYZAL) 5 MG tablet Take 5 mg by mouth daily.     Magnesium Oxide (MAG-200) 200 MG TABS Take 200 mg by mouth daily.     Melatonin 5 MG CAPS Take 5 mg by mouth at bedtime as needed (sleep.).      methocarbamol (ROBAXIN) 500 MG tablet Take 1 tablet (500 mg total) by mouth every 6 (six) hours as needed for muscle spasms. 40 tablet 0   Omega-3 Fatty Acids (FISH OIL ULTRA) 1400 MG CAPS Take 1,400 mg by mouth daily.     omeprazole (PRILOSEC) 20 MG capsule Take 20 mg by mouth daily.     traMADol (ULTRAM) 50 MG tablet Take 1-2 tablets (50-100 mg total) by mouth every 6 (six) hours as needed for moderate pain. 40 tablet 0   TURMERIC PO Take 1,000 mg by mouth daily. *Liquid-3 teaspoonful*     No current facility-administered medications on file prior to visit.    Review of Systems: a complete, 10pt review of systems was completed with pertinent positives and negatives as documented in the HPI  Physical Exam: Vitals  Weight: 248 lb   Height: 69 in  Body Surface Area: 2.26 m   Body Mass Index: 36.62 kg/m   Temp.: 98.6 F (Tympanic)    Pulse: 72 (Regular)    P.OX: 95% (Room air) BP: 148/88 (Sitting, Left Arm, Standard)  He is alert and cooperative. Unlabored respirations. There is a subcutaneous cyst just to the right of midline at the superior aspect of the natal cleft and about 2 cm inferior to this is a small pit, consistent with pilonidal disease.  CBC Latest Ref Rng & Units 05/28/2019 04/11/2019 04/09/2019  WBC 3.8 - 10.8 Thousand/uL 4.7 9.2 5.4  Hemoglobin 13.2 - 17.1 g/dL 15.5 15.9 16.3  Hematocrit 38.5 - 50.0 % 46.5 47.2 48.7  Platelets 140 - 400 Thousand/uL 151 120(L) 120(L)    CMP Latest Ref Rng & Units  05/28/2019 04/11/2019 04/09/2019  Glucose 65 - 99 mg/dL 85 135(H) 91  BUN 7 - 25 mg/dL 20 18 22(H)  Creatinine 0.70 - 1.33 mg/dL 0.91 0.96 0.98  Sodium 135 - 146 mmol/L 139 138 140  Potassium 3.5 -  5.3 mmol/L 4.4 4.3 4.2  Chloride 98 - 110 mmol/L 105 106 106  CO2 20 - 32 mmol/L 27 24 24   Calcium 8.6 - 10.3 mg/dL 9.2 8.9 9.4  Total Protein 6.1 - 8.1 g/dL 6.9 - 7.5  Total Bilirubin 0.2 - 1.2 mg/dL 0.6 - 1.1  Alkaline Phos 38 - 126 U/L - - 73  AST 10 - 35 U/L 19 - 25  ALT 9 - 46 U/L 23 - 35    Lab Results  Component Value Date   INR 0.9 04/09/2019   INR 0.99 11/16/2016    Imaging: No results found.   A/P: PILONIDAL CYST (L05.91) Story: Discussed the natural history of this disease and options going forward including hair removal and ongoing observation versus marsupialization/excision. Discussed that surgery will result in an open wound, require local wound care with once or twice daily packing changes for several weeks. Discussed risks of bleeding, infection, pain, scarring, poor wound healing, etc. Recommend hair removal indefinitely before and after surgery to reduce risk of recurrence. Questions also been answered. He wishes to proceed with surgery.  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   High risk medications (not anticoagulants) long-term use 09/20/2016   Bilateral hand pain 09/20/2016   DDD (degenerative disc disease), cervical 09/20/2016   History of total hip replacement, bilateral 09/20/2016   DDD (degenerative disc disease), lumbar 09/20/2016   Bilateral plantar fasciitis 09/20/2016   Psoriasis 09/20/2016   Primary osteoarthritis of both feet 09/20/2016   Primary osteoarthritis of both hands 09/20/2016   Primary osteoarthritis of both knees 09/20/2016   Prediabetes 09/22/2014   Hyperlipidemia 09/22/2014   URI (upper respiratory  infection) 07/07/2012   Acute bronchitis 07/07/2012   HYPERLIPIDEMIA 07/03/2008   OBESITY 07/03/2008   AVASCULAR NECROSIS, FEMORAL HEAD 07/03/2008   Psoriatic arthritis ([place]) 05/02/2001       Romana Juniper, MD [facility]  See AMION to contact appropriate on-call provider

## 2019-06-10 DIAGNOSIS — L814 Other melanin hyperpigmentation: Secondary | ICD-10-CM | POA: Diagnosis not present

## 2019-06-10 DIAGNOSIS — D225 Melanocytic nevi of trunk: Secondary | ICD-10-CM | POA: Diagnosis not present

## 2019-06-10 DIAGNOSIS — D485 Neoplasm of uncertain behavior of skin: Secondary | ICD-10-CM | POA: Diagnosis not present

## 2019-06-10 DIAGNOSIS — L905 Scar conditions and fibrosis of skin: Secondary | ICD-10-CM | POA: Diagnosis not present

## 2019-06-10 DIAGNOSIS — L0591 Pilonidal cyst without abscess: Secondary | ICD-10-CM | POA: Diagnosis not present

## 2019-06-10 DIAGNOSIS — D1801 Hemangioma of skin and subcutaneous tissue: Secondary | ICD-10-CM | POA: Diagnosis not present

## 2019-06-11 ENCOUNTER — Other Ambulatory Visit: Payer: Self-pay

## 2019-06-11 ENCOUNTER — Encounter: Payer: Self-pay | Admitting: Rheumatology

## 2019-06-11 ENCOUNTER — Ambulatory Visit (INDEPENDENT_AMBULATORY_CARE_PROVIDER_SITE_OTHER): Payer: 59 | Admitting: Rheumatology

## 2019-06-11 VITALS — BP 150/81 | Resp 13 | Ht 69.0 in | Wt 253.4 lb

## 2019-06-11 DIAGNOSIS — Z87898 Personal history of other specified conditions: Secondary | ICD-10-CM

## 2019-06-11 DIAGNOSIS — M19042 Primary osteoarthritis, left hand: Secondary | ICD-10-CM

## 2019-06-11 DIAGNOSIS — M19071 Primary osteoarthritis, right ankle and foot: Secondary | ICD-10-CM

## 2019-06-11 DIAGNOSIS — M5136 Other intervertebral disc degeneration, lumbar region: Secondary | ICD-10-CM | POA: Diagnosis not present

## 2019-06-11 DIAGNOSIS — Z79899 Other long term (current) drug therapy: Secondary | ICD-10-CM | POA: Diagnosis not present

## 2019-06-11 DIAGNOSIS — Z8679 Personal history of other diseases of the circulatory system: Secondary | ICD-10-CM

## 2019-06-11 DIAGNOSIS — L4050 Arthropathic psoriasis, unspecified: Secondary | ICD-10-CM

## 2019-06-11 DIAGNOSIS — M19041 Primary osteoarthritis, right hand: Secondary | ICD-10-CM | POA: Diagnosis not present

## 2019-06-11 DIAGNOSIS — Z96643 Presence of artificial hip joint, bilateral: Secondary | ICD-10-CM

## 2019-06-11 DIAGNOSIS — M170 Bilateral primary osteoarthritis of knee: Secondary | ICD-10-CM

## 2019-06-11 DIAGNOSIS — M19072 Primary osteoarthritis, left ankle and foot: Secondary | ICD-10-CM

## 2019-06-11 DIAGNOSIS — Z8639 Personal history of other endocrine, nutritional and metabolic disease: Secondary | ICD-10-CM

## 2019-06-11 DIAGNOSIS — L409 Psoriasis, unspecified: Secondary | ICD-10-CM | POA: Diagnosis not present

## 2019-06-11 DIAGNOSIS — M5030 Other cervical disc degeneration, unspecified cervical region: Secondary | ICD-10-CM

## 2019-06-11 DIAGNOSIS — R5383 Other fatigue: Secondary | ICD-10-CM

## 2019-06-11 MED FILL — ENBREL 50 MG/ML SOSY: 50 | 28 days supply | Qty: 4 | Fill #1

## 2019-06-11 NOTE — Patient Instructions (Signed)
Standing Labs We placed an order today for your standing lab work.    Please come back and get your standing labs in April and every 3 months   We have open lab daily Monday through Thursday from 8:30-12:30 PM and 1:30-4:30 PM and Friday from 8:30-12:30 PM and 1:30-4:00 PM at the office of Dr. Shaili Deveshwar.   You may experience shorter wait times on Monday and Friday afternoons. The office is located at [address], [address], [place], [place] [address] No appointment is necessary.   Labs are drawn by [facility].  You may receive a bill from [facility] for your lab work.  If you wish to have your labs drawn at another location, please call the office 24 hours in advance to send orders.  If you have any questions regarding directions or hours of operation,  please call [phone].   Just as a reminder please drink plenty of water prior to coming for your lab work. Thanks!   

## 2019-07-09 MED FILL — ENBREL 50 MG/ML SOSY: 50 | 28 days supply | Qty: 4 | Fill #2

## 2019-07-10 ENCOUNTER — Ambulatory Visit: Payer: 59 | Attending: Internal Medicine

## 2019-07-10 DIAGNOSIS — Z20822 Contact with and (suspected) exposure to covid-19: Secondary | ICD-10-CM

## 2019-07-11 LAB — NOVEL CORONAVIRUS, NAA: SARS-CoV-2, NAA: DETECTED — AB

## 2019-07-12 ENCOUNTER — Telehealth: Payer: Self-pay | Admitting: Adult Health

## 2019-07-12 ENCOUNTER — Encounter: Payer: Self-pay | Admitting: Adult Health

## 2019-07-12 NOTE — Telephone Encounter (Signed)
Called to discuss with patient about Covid symptoms and the use of bamlanivimab, a monoclonal antibody infusion for those with mild to moderate Covid symptoms and at a high risk of hospitalization.  Pt is qualified for this infusion at the [facility] infusion center due to BMI>35.  Unable to reach pt, voicemail left. MyChart message sent to pt.  Mina Marble, NP-C

## 2019-07-12 NOTE — Telephone Encounter (Signed)
Called to discuss with patient about Covid symptoms and the use of bamlanivimab, a monoclonal antibody infusion for those with mild to moderate Covid symptoms and at a high risk of hospitalization.  Pt is qualified for this infusion at the [facility] infusion center due to BMI>35   He feels like he is getting better and now on symptom day 10 today. Does not qualify for monoclonal ab infusion due to symptom duration.

## 2019-07-17 ENCOUNTER — Telehealth (INDEPENDENT_AMBULATORY_CARE_PROVIDER_SITE_OTHER): Payer: 59 | Admitting: Family Medicine

## 2019-07-17 ENCOUNTER — Other Ambulatory Visit: Payer: Self-pay

## 2019-07-17 DIAGNOSIS — U071 COVID-19: Secondary | ICD-10-CM

## 2019-07-17 MED ORDER — HYDROCODONE-HOMATROPINE 5-1.5 MG/5ML PO SYRP: 5.0000 mL | ORAL_SOLUTION | Freq: Four times a day (QID) | ORAL | 0 refills | Status: DC | PRN

## 2019-07-17 MED FILL — HYDROCODONE-HOMATROPINE SYR: 5-1.5 | 6 days supply | Qty: 120 | Fill #0

## 2019-07-17 NOTE — Progress Notes (Signed)
This visit type was conducted due to national recommendations for restrictions regarding the COVID-19 pandemic in an effort to limit this patient's exposure and mitigate transmission in our community.   Virtual Visit via Video Note  I connected with [name] on 07/17/19 at 11:45 AM EDT by a video enabled telemedicine application and verified that I am speaking with the correct person using two identifiers.  Location patient: home Location provider:work or home office Persons participating in the virtual visit: patient, provider  I discussed the limitations of evaluation and management by telemedicine and the availability of in person appointments. The patient expressed understanding and agreed to proceed.   HPI: [name] was diagnosed with Covid 1 week ago today.  His symptoms started about 2 weeks ago.  He still has increased malaise, cough, nasal congestion, some mild body aches.  No fever.  No chills.  No nausea, vomiting, or diarrhea.  He has had some dyspnea with things like negotiating stairs.  His main issue is severe cough at times.  Nonproductive.  Sleeping okay.  Had called requesting cough suppressant.  He has taken Hycodan in the past.  He has psoriasis and is on immunosuppressant therapy.  He denies any leg edema or any leg pain.  No pleuritic pain   ROS: See pertinent positives and negatives per HPI.  Past Medical History:  Diagnosis Date  . Anemia   . History of avascular necrosis of capital femoral epiphysis 2006, 2007   Bilateral Femoral Head  . HLD (hyperlipidemia)   . Pneumonia    twice  . Psoriatic arthritis [facility])     Past Surgical History:  Procedure Laterality Date  . JOINT REPLACEMENT Bilateral 2006, 2007   Necrosis femoral head (bilateral)  . TOTAL HIP REVISION Right 11/23/2016   Procedure: Acetabulum liner and femoral head revision;  Surgeon: [name], MD;  Location: [facility];  Service: Orthopedics;  Laterality: Right;  . TOTAL HIP REVISION Left  04/10/2019   Procedure: Left hip bearing surface vs total hip arthroplasty revision;  Surgeon: [name], MD;  Location: [facility];  Service: Orthopedics;  Laterality: Left;  122min    Family History  Problem Relation Age of Onset  . Pneumonia Father   . Tremor Neg Hx     SOCIAL HX: Non-smoker   Current Outpatient Medications:  .  acetaminophen (TYLENOL) 500 MG tablet, Take 1,000 mg by mouth daily. , Disp: , Rfl:  .  aspirin EC 325 MG EC tablet, Take 1 tablet (325 mg total) by mouth 2 (two) times daily. (Patient not taking: Reported on 06/11/2019), Disp: 40 tablet, Rfl: 0 .  cholecalciferol (VITAMIN D) 1000 units tablet, Take 2,000 Units by mouth daily. , Disp: , Rfl:  .  Coenzyme Q10 (COQ10) 100 MG CAPS, Take 100 mg by mouth daily. *liquid*, Disp: , Rfl:  .  etanercept (ENBREL) 50 MG/ML injection, Inject 0.98 mLs (50 mg total) into the skin once a week. Inject 1 syringe into the skin once a week., Disp: 12 mL, Rfl: 0 .  fluticasone (FLONASE) 50 MCG/ACT nasal spray, Place 1 spray into both nostrils 2 (two) times daily., Disp: , Rfl:  .  HYDROcodone-acetaminophen (NORCO/VICODIN) 5-325 MG tablet, Take 1-2 tablets by mouth every 6 (six) hours as needed for severe pain. (Patient not taking: Reported on 06/11/2019), Disp: 40 tablet, Rfl: 0 .  HYDROcodone-homatropine (HYCODAN) 5-1.5 MG/5ML syrup, Take 5 mLs by mouth every 6 (six) hours as needed for up to 10 days., Disp: 120 mL, Rfl: 0 .  levocetirizine (XYZAL) 5 MG tablet, Take 5 mg by mouth daily., Disp: , Rfl:  .  Magnesium Oxide (MAG-200) 200 MG TABS, Take 200 mg by mouth daily., Disp: , Rfl:  .  Melatonin 5 MG CAPS, Take 5 mg by mouth at bedtime as needed (sleep.). , Disp: , Rfl:  .  methocarbamol (ROBAXIN) 500 MG tablet, Take 1 tablet (500 mg total) by mouth every 6 (six) hours as needed for muscle spasms. (Patient not taking: Reported on 06/11/2019), Disp: 40 tablet, Rfl: 0 .  Omega-3 Fatty Acids (FISH OIL ULTRA) 1400 MG CAPS, Take 1,400 mg by  mouth daily., Disp: , Rfl:  .  omeprazole (PRILOSEC) 20 MG capsule, Take 20 mg by mouth daily., Disp: , Rfl:  .  traMADol (ULTRAM) 50 MG tablet, Take 1-2 tablets (50-100 mg total) by mouth every 6 (six) hours as needed for moderate pain. (Patient not taking: Reported on 06/11/2019), Disp: 40 tablet, Rfl: 0 .  TURMERIC PO, Take 1,000 mg by mouth daily. *Liquid-3 teaspoonful*, Disp: , Rfl:   EXAM:  VITALS per patient if applicable:  GENERAL: alert, oriented, appears well and in no acute distress  HEENT: atraumatic, conjunttiva clear, no obvious abnormalities on inspection of external nose and ears  NECK: normal movements of the head and neck  LUNGS: on inspection no signs of respiratory distress, breathing rate appears normal, no obvious gross SOB, gasping or wheezing  CV: no obvious cyanosis  MS: moves all visible extremities without noticeable abnormality  PSYCH/NEURO: pleasant and cooperative, no obvious depression or anxiety, speech and thought processing grossly intact  ASSESSMENT AND PLAN:  Discussed the following assessment and plan:  COVID-19 diagnosis.  He has severe cough at times but no persistent fever.  No respiratory distress at rest.  -Continue plenty of fluids and rest  -We suggested he consider pulse oximeter to monitor oxygen levels and be in touch if this is in the low 90 range or lower  -Agreed to limited Hycodan cough syrup 1 teaspoon nightly for severe cough  -He has passed the 10-day window to consider monoclonal antibody infusion  -Follow-up promptly for any increased shortness of breath or other concern     I discussed the assessment and treatment plan with the patient. The patient was provided an opportunity to ask questions and all were answered. The patient agreed with the plan and demonstrated an understanding of the instructions.   The patient was advised to call back or seek an in-person evaluation if the symptoms worsen or if the condition fails  to improve as anticipated.     Carolann Littler, MD

## 2019-07-19 ENCOUNTER — Telehealth: Payer: Self-pay | Admitting: Family Medicine

## 2019-07-19 ENCOUNTER — Inpatient Hospital Stay (HOSPITAL_COMMUNITY)
Admission: EM | Admit: 2019-07-19 | Discharge: 2019-07-24 | DRG: 177 | Disposition: A | Discharge status: Home / Self Care | Payer: 59 | Attending: Internal Medicine | Admitting: Internal Medicine

## 2019-07-19 ENCOUNTER — Emergency Department (HOSPITAL_COMMUNITY): Payer: 59

## 2019-07-19 ENCOUNTER — Other Ambulatory Visit: Payer: Self-pay

## 2019-07-19 ENCOUNTER — Encounter (HOSPITAL_COMMUNITY): Payer: Self-pay

## 2019-07-19 DIAGNOSIS — I82403 Acute embolism and thrombosis of unspecified deep veins of lower extremity, bilateral: Secondary | ICD-10-CM | POA: Diagnosis present

## 2019-07-19 DIAGNOSIS — I82412 Acute embolism and thrombosis of left femoral vein: Secondary | ICD-10-CM | POA: Diagnosis present

## 2019-07-19 DIAGNOSIS — M170 Bilateral primary osteoarthritis of knee: Secondary | ICD-10-CM | POA: Diagnosis present

## 2019-07-19 DIAGNOSIS — Z8701 Personal history of pneumonia (recurrent): Secondary | ICD-10-CM | POA: Diagnosis not present

## 2019-07-19 DIAGNOSIS — E785 Hyperlipidemia, unspecified: Secondary | ICD-10-CM | POA: Diagnosis present

## 2019-07-19 DIAGNOSIS — M7989 Other specified soft tissue disorders: Secondary | ICD-10-CM

## 2019-07-19 DIAGNOSIS — J984 Other disorders of lung: Secondary | ICD-10-CM | POA: Diagnosis present

## 2019-07-19 DIAGNOSIS — I82452 Acute embolism and thrombosis of left peroneal vein: Secondary | ICD-10-CM | POA: Diagnosis present

## 2019-07-19 DIAGNOSIS — R001 Bradycardia, unspecified: Secondary | ICD-10-CM | POA: Diagnosis not present

## 2019-07-19 DIAGNOSIS — M5136 Other intervertebral disc degeneration, lumbar region: Secondary | ICD-10-CM | POA: Diagnosis present

## 2019-07-19 DIAGNOSIS — Z79890 Hormone replacement therapy (postmenopausal): Secondary | ICD-10-CM

## 2019-07-19 DIAGNOSIS — L4050 Arthropathic psoriasis, unspecified: Secondary | ICD-10-CM | POA: Diagnosis present

## 2019-07-19 DIAGNOSIS — J9601 Acute respiratory failure with hypoxia: Secondary | ICD-10-CM | POA: Diagnosis not present

## 2019-07-19 DIAGNOSIS — M19041 Primary osteoarthritis, right hand: Secondary | ICD-10-CM | POA: Diagnosis present

## 2019-07-19 DIAGNOSIS — I2699 Other pulmonary embolism without acute cor pulmonale: Secondary | ICD-10-CM

## 2019-07-19 DIAGNOSIS — Z96643 Presence of artificial hip joint, bilateral: Secondary | ICD-10-CM | POA: Diagnosis present

## 2019-07-19 DIAGNOSIS — M5030 Other cervical disc degeneration, unspecified cervical region: Secondary | ICD-10-CM | POA: Diagnosis present

## 2019-07-19 DIAGNOSIS — D696 Thrombocytopenia, unspecified: Secondary | ICD-10-CM | POA: Diagnosis present

## 2019-07-19 DIAGNOSIS — Z6836 Body mass index (BMI) 36.0-36.9, adult: Secondary | ICD-10-CM | POA: Diagnosis not present

## 2019-07-19 DIAGNOSIS — M19072 Primary osteoarthritis, left ankle and foot: Secondary | ICD-10-CM | POA: Diagnosis present

## 2019-07-19 DIAGNOSIS — I82432 Acute embolism and thrombosis of left popliteal vein: Secondary | ICD-10-CM | POA: Diagnosis present

## 2019-07-19 DIAGNOSIS — Z836 Family history of other diseases of the respiratory system: Secondary | ICD-10-CM

## 2019-07-19 DIAGNOSIS — R0902 Hypoxemia: Secondary | ICD-10-CM | POA: Diagnosis not present

## 2019-07-19 DIAGNOSIS — Z79899 Other long term (current) drug therapy: Secondary | ICD-10-CM

## 2019-07-19 DIAGNOSIS — R0602 Shortness of breath: Secondary | ICD-10-CM | POA: Diagnosis not present

## 2019-07-19 DIAGNOSIS — I82442 Acute embolism and thrombosis of left tibial vein: Secondary | ICD-10-CM | POA: Diagnosis present

## 2019-07-19 DIAGNOSIS — E669 Obesity, unspecified: Secondary | ICD-10-CM | POA: Diagnosis present

## 2019-07-19 DIAGNOSIS — M79609 Pain in unspecified limb: Secondary | ICD-10-CM | POA: Diagnosis not present

## 2019-07-19 DIAGNOSIS — I10 Essential (primary) hypertension: Secondary | ICD-10-CM | POA: Diagnosis not present

## 2019-07-19 DIAGNOSIS — U071 COVID-19: Principal | ICD-10-CM | POA: Diagnosis present

## 2019-07-19 DIAGNOSIS — I82402 Acute embolism and thrombosis of unspecified deep veins of left lower extremity: Secondary | ICD-10-CM | POA: Diagnosis not present

## 2019-07-19 DIAGNOSIS — M19042 Primary osteoarthritis, left hand: Secondary | ICD-10-CM | POA: Diagnosis present

## 2019-07-19 DIAGNOSIS — M19071 Primary osteoarthritis, right ankle and foot: Secondary | ICD-10-CM | POA: Diagnosis present

## 2019-07-19 DIAGNOSIS — I824Y2 Acute embolism and thrombosis of unspecified deep veins of left proximal lower extremity: Secondary | ICD-10-CM | POA: Diagnosis not present

## 2019-07-19 DIAGNOSIS — I2609 Other pulmonary embolism with acute cor pulmonale: Secondary | ICD-10-CM | POA: Diagnosis not present

## 2019-07-19 DIAGNOSIS — R509 Fever, unspecified: Secondary | ICD-10-CM | POA: Diagnosis not present

## 2019-07-19 DIAGNOSIS — I2602 Saddle embolus of pulmonary artery with acute cor pulmonale: Secondary | ICD-10-CM | POA: Diagnosis not present

## 2019-07-19 DIAGNOSIS — I82409 Acute embolism and thrombosis of unspecified deep veins of unspecified lower extremity: Secondary | ICD-10-CM

## 2019-07-19 LAB — CBC WITH DIFFERENTIAL/PLATELET
Abs Immature Granulocytes: 0.02 10*3/uL (ref 0.00–0.07)
Basophils Absolute: 0 10*3/uL (ref 0.0–0.1)
Basophils Relative: 0 %
Eosinophils Absolute: 0.1 10*3/uL (ref 0.0–0.5)
Eosinophils Relative: 2 %
HCT: 47.6 % (ref 39.0–52.0)
Hemoglobin: 15.9 g/dL (ref 13.0–17.0)
Immature Granulocytes: 0 %
Lymphocytes Relative: 19 %
Lymphs Abs: 1.6 10*3/uL (ref 0.7–4.0)
MCH: 31.1 pg (ref 26.0–34.0)
MCHC: 33.4 g/dL (ref 30.0–36.0)
MCV: 93.2 fL (ref 80.0–100.0)
Monocytes Absolute: 0.4 10*3/uL (ref 0.1–1.0)
Monocytes Relative: 5 %
Neutro Abs: 6.2 10*3/uL (ref 1.7–7.7)
Neutrophils Relative %: 74 %
Platelets: 143 10*3/uL — ABNORMAL LOW (ref 150–400)
RBC: 5.11 MIL/uL (ref 4.22–5.81)
RDW: 12.5 % (ref 11.5–15.5)
WBC: 8.4 10*3/uL (ref 4.0–10.5)
nRBC: 0 % (ref 0.0–0.2)

## 2019-07-19 LAB — LACTATE DEHYDROGENASE: LDH: 177 U/L (ref 98–192)

## 2019-07-19 LAB — D-DIMER, QUANTITATIVE: D-Dimer, Quant: >20 ug/mL-FEU — ABNORMAL HIGH (ref 0.00–0.50)

## 2019-07-19 LAB — PROCALCITONIN: Procalcitonin: <0.05 ng/mL

## 2019-07-19 LAB — COMPREHENSIVE METABOLIC PANEL
ALT: 32 U/L (ref 0–44)
AST: 22 U/L (ref 15–41)
Albumin: 4 g/dL (ref 3.5–5.0)
Alkaline Phosphatase: 75 U/L (ref 38–126)
Anion gap: 10 (ref 5–15)
BUN: 19 mg/dL (ref 6–20)
CO2: 23 mmol/L (ref 22–32)
Calcium: 8.8 mg/dL — ABNORMAL LOW (ref 8.9–10.3)
Chloride: 104 mmol/L (ref 98–111)
Creatinine, Ser: 0.98 mg/dL (ref 0.61–1.24)
GFR calc Af Amer: >60 mL/min (ref >60)
GFR calc non Af Amer: >60 mL/min (ref >60)
Glucose, Bld: 99 mg/dL (ref 70–99)
Potassium: 3.9 mmol/L (ref 3.5–5.1)
Sodium: 137 mmol/L (ref 135–145)
Total Bilirubin: 1 mg/dL (ref 0.3–1.2)
Total Protein: 7.6 g/dL (ref 6.5–8.1)

## 2019-07-19 LAB — FIBRINOGEN: Fibrinogen: 367 mg/dL (ref 210–475)

## 2019-07-19 LAB — BRAIN NATRIURETIC PEPTIDE: B Natriuretic Peptide: 20.1 pg/mL (ref 0.0–100.0)

## 2019-07-19 LAB — FERRITIN: Ferritin: 95 ng/mL (ref 24–336)

## 2019-07-19 LAB — TROPONIN I (HIGH SENSITIVITY)
Troponin I (High Sensitivity): 10 ng/L (ref <18)
Troponin I (High Sensitivity): 15 ng/L (ref <18)

## 2019-07-19 LAB — LACTIC ACID, PLASMA
Lactic Acid, Venous: 1.1 mmol/L (ref 0.5–1.9)
Lactic Acid, Venous: 1 mmol/L (ref 0.5–1.9)

## 2019-07-19 LAB — C-REACTIVE PROTEIN: CRP: 5.2 mg/dL — ABNORMAL HIGH (ref <1.0)

## 2019-07-19 LAB — TRIGLYCERIDES: Triglycerides: 132 mg/dL (ref <150)

## 2019-07-19 MED ORDER — DEXAMETHASONE SODIUM PHOSPHATE 10 MG/ML IJ SOLN
10.0000 mg | Freq: Once | INTRAMUSCULAR | Status: AC
  Administered 2019-07-19: 10 mg via INTRAVENOUS
  Filled 2019-07-19: qty 1

## 2019-07-19 MED ORDER — HEPARIN (PORCINE) 25000 UT/250ML-% IV SOLN
1600.0000 [IU]/h | INTRAVENOUS | Status: DC
  Administered 2019-07-20: 1600 [IU]/h via INTRAVENOUS
  Filled 2019-07-19: qty 250

## 2019-07-19 MED ORDER — ACETAMINOPHEN 500 MG PO TABS
1000.0000 mg | ORAL_TABLET | Freq: Once | ORAL | Status: AC
  Administered 2019-07-19: 1000 mg via ORAL
  Filled 2019-07-19: qty 2

## 2019-07-19 MED ORDER — SODIUM CHLORIDE (PF) 0.9 % IJ SOLN
INTRAMUSCULAR | Status: AC
  Administered 2019-07-19: 10 mL
  Filled 2019-07-19: qty 50

## 2019-07-19 MED ORDER — IOHEXOL 350 MG/ML SOLN
100.0000 mL | Freq: Once | INTRAVENOUS | Status: AC | PRN
  Administered 2019-07-19: 100 mL via INTRAVENOUS

## 2019-07-19 MED ORDER — HEPARIN BOLUS VIA INFUSION
5000.0000 [IU] | Freq: Once | INTRAVENOUS | Status: AC
  Administered 2019-07-20: 5000 [IU] via INTRAVENOUS
  Filled 2019-07-19: qty 5000

## 2019-07-19 MED ORDER — SODIUM CHLORIDE 0.9 % IV SOLN
1000.0000 mL | INTRAVENOUS | Status: DC
  Administered 2019-07-19: 1000 mL via INTRAVENOUS

## 2019-07-19 NOTE — Progress Notes (Signed)
ANTICOAGULATION CONSULT NOTE - Initial Consult  Pharmacy Consult for IV heparin Indication: pulmonary embolus  Allergies  Allergen Reactions  . Gold-Containing Drug Products Rash    Denies oral or airway involvement - occurred in the 1980s.     Patient Measurements: Height: 5\' 9"  (175.3 cm) Weight: 237 lb (107.5 kg) IBW/kg (Calculated) : 70.7 Heparin Dosing Weight: 82 kg  Vital Signs: Temp: 100 F (37.8 C) (03/19 2055) Temp Source: Oral (03/19 2055) BP: 153/89 (03/19 2055) Pulse Rate: 98 (03/19 2055)  Labs: Recent Labs    07/19/19 2035  HGB 15.9  HCT 47.6  PLT 143*  CREATININE 0.98  TROPONINIHS 10    Estimated Creatinine Clearance: 102.9 mL/min (by C-G formula based on SCr of 0.98 mg/dL).   Medical History: Past Medical History:  Diagnosis Date  . Anemia   . History of avascular necrosis of capital femoral epiphysis 2006, 2007   Bilateral Femoral Head  . HLD (hyperlipidemia)   . Pneumonia    twice  . Psoriatic arthritis ([place])     Medications:  Scheduled:  . heparin  5,000 Units Intravenous Once  . sodium chloride (PF)       Infusions:  . sodium chloride 1,000 mL (07/19/19 2240)  . heparin      Assessment: 71 yoM with left calf pain, covid + x 2 weeks found to have bilateral lobar and peripheral PE with right heart strain.  Baseline labs: H/H = 15.9/47.6, plts = 143, aptt and INR pending  Goal of Therapy:  Heparin level 0.3-0.7 units/ml Monitor platelets by anticoagulation protocol: Yes    Plan:  Baseline aptt and INR stat Heparin 5000 unit IV bolus x1 now Start heparin drip at 1600 units/hr Daily HL/CBC Check 1st HL in 6 hours  Dorrene German 07/19/2019,11:01 PM

## 2019-07-19 NOTE — ED Provider Notes (Signed)
[facility] DEPT Provider Note   CSN: [id number] Arrival date & time: 07/19/19  2012     History Chief Complaint  Patient presents with  . Leg Pain  . Fever  . Shortness of Breath    [name] is a 54 y.o. male.  Pt presents to the ED today with SOB.  Pt was diagnosed with Covid on 3/10.  Pt said he was sob for the past 1 week prior to he was diagnosed.  He was offered monoclonal Ab infusion, but refused on 3/12 because he said he was starting to feel better.  Pt said he's not been getting out of the chair much.  He has not been eating or drinking much.  Pt noticed some swelling in the left leg today.  He had some SOB so called EMS today.  RA O2 sat 80%.  Pt put on 2L oxygen and sats are in the mid-90s.        Past Medical History:  Diagnosis Date  . Anemia   . History of avascular necrosis of capital femoral epiphysis 2006, 2007   Bilateral Femoral Head  . HLD (hyperlipidemia)   . Pneumonia    twice  . Psoriatic arthritis [facility])     Patient Active Problem List   Diagnosis Date Noted  . COVID-19 virus infection 07/20/2019  . Left leg swelling 07/20/2019  . Acute respiratory failure with hypoxia ([place]) 07/20/2019  . Acute pulmonary embolism ([place]) 07/19/2019  . Essential tremor 04/03/2017  . Elevated blood pressure reading 02/03/2017  . Failed total hip arthroplasty ([place]) 11/23/2016  . High risk medications (not anticoagulants) long-term use 09/20/2016  . Bilateral hand pain 09/20/2016  . DDD (degenerative disc disease), cervical 09/20/2016  . History of total hip replacement, bilateral 09/20/2016  . DDD (degenerative disc disease), lumbar 09/20/2016  . Bilateral plantar fasciitis 09/20/2016  . Psoriasis 09/20/2016  . Primary osteoarthritis of both feet 09/20/2016  . Primary osteoarthritis of both hands 09/20/2016  . Primary osteoarthritis of both knees 09/20/2016  . Prediabetes 09/22/2014  . Hyperlipidemia 09/22/2014  . URI  (upper respiratory infection) 07/07/2012  . Acute bronchitis 07/07/2012  . HYPERLIPIDEMIA 07/03/2008  . OBESITY 07/03/2008  . AVASCULAR NECROSIS, FEMORAL HEAD 07/03/2008  . Psoriatic arthritis ([place]) 05/02/2001    Past Surgical History:  Procedure Laterality Date  . JOINT REPLACEMENT Bilateral 2006, 2007   Necrosis femoral head (bilateral)  . TOTAL HIP REVISION Right 11/23/2016   Procedure: Acetabulum liner and femoral head revision;  Surgeon: [name], MD;  Location: [facility] ORS;  Service: Orthopedics;  Laterality: Right;  . TOTAL HIP REVISION Left 04/10/2019   Procedure: Left hip bearing surface vs total hip arthroplasty revision;  Surgeon: [name], MD;  Location: [facility] ORS;  Service: Orthopedics;  Laterality: Left;  156min       Family History  Problem Relation Age of Onset  . Pneumonia Father   . Tremor Neg Hx     Social History   Tobacco Use  . Smoking status: Never Smoker  . Smokeless tobacco: Never Used  Substance Use Topics  . Alcohol use: Yes    Alcohol/week: 0.0 standard drinks    Comment: 3-4 drinks per week  . Drug use: No    Home Medications Prior to Admission medications   Medication Sig Start Date End Date Taking? Authorizing Provider  acetaminophen (TYLENOL) 500 MG tablet Take 1,000 mg by mouth every 8 (eight) hours as needed for mild pain or moderate pain.  Yes [provider]  cholecalciferol (VITAMIN D) 1000 units tablet Take 2,000 Units by mouth daily.    Yes [provider]  Coenzyme Q10 (COQ10) 100 MG CAPS Take 100 mg by mouth daily.    Yes [provider]  etanercept (ENBREL) 50 MG/ML injection Inject 0.98 mLs (50 mg total) into the skin once a week. Inject 1 syringe into the skin once a week. Patient taking differently: Inject 50 mg into the skin every Sunday. Inject 1 syringe into the skin once a week. 05/08/19  Yes Jegede, [name], MD  HYDROcodone-homatropine (HYCODAN) 5-1.5 MG/5ML syrup Take 5 mLs by mouth  every 6 (six) hours as needed for up to 10 days. 07/17/19 07/27/19 Yes Burchette, [name], MD  levocetirizine (XYZAL) 5 MG tablet Take 5 mg by mouth daily.   Yes [provider]  Magnesium Oxide (MAG-200) 200 MG TABS Take 200 mg by mouth daily.   Yes [provider]  Omega-3 Fatty Acids (FISH OIL ULTRA) 1400 MG CAPS Take 1,400 mg by mouth daily.   Yes [provider]  omeprazole (PRILOSEC) 20 MG capsule Take 20 mg by mouth daily.   Yes [provider]  TURMERIC PO Take 15 mLs by mouth daily.    Yes [provider]  aspirin EC 325 MG EC tablet Take 1 tablet (325 mg total) by mouth 2 (two) times daily. Patient not taking: Reported on 06/11/2019 04/11/19   [name], PA-C  HYDROcodone-acetaminophen (NORCO/VICODIN) 5-325 MG tablet Take 1-2 tablets by mouth every 6 (six) hours as needed for severe pain. Patient not taking: Reported on 06/11/2019 04/11/19   [name], PA-C  Melatonin 5 MG CAPS Take 5 mg by mouth at bedtime.     [provider]  methocarbamol (ROBAXIN) 500 MG tablet Take 1 tablet (500 mg total) by mouth every 6 (six) hours as needed for muscle spasms. Patient not taking: Reported on 06/11/2019 04/11/19   [name], PA-C  traMADol (ULTRAM) 50 MG tablet Take 1-2 tablets (50-100 mg total) by mouth every 6 (six) hours as needed for moderate pain. Patient not taking: Reported on 06/11/2019 04/11/19   [name], PA-C    Allergies    Gold-containing drug products  Review of Systems   Review of Systems  Respiratory: Positive for shortness of breath.   Musculoskeletal:       Left leg swelling  All other systems reviewed and are negative.   Physical Exam Updated Vital Signs BP 127/71 (BP Location: Left Arm)   Pulse 62   Temp 97.9 F (36.6 C) (Oral)   Resp 18   Ht 5\' 9"  (1.753 m)   Wt 107.2 kg   SpO2 98%   BMI 34.90 kg/m   Physical Exam Vitals and nursing note reviewed.  Constitutional:      Appearance:  He is well-developed.  HENT:     Head: Normocephalic and atraumatic.     Mouth/Throat:     Mouth: Mucous membranes are moist.     Pharynx: Oropharynx is clear.  Eyes:     Extraocular Movements: Extraocular movements intact.     Pupils: Pupils are equal, round, and reactive to light.  Cardiovascular:     Rate and Rhythm: Normal rate and regular rhythm.  Pulmonary:     Effort: Pulmonary effort is normal.     Breath sounds: Normal breath sounds.  Abdominal:     General: Bowel sounds are normal.     Palpations: Abdomen is soft.  Musculoskeletal:        General: Normal range of motion.     Cervical back: Normal range of motion and neck supple.     Left lower leg: Edema present.  Skin:    General: Skin is warm.     Capillary Refill: Capillary refill takes less than 2 seconds.  Neurological:     General: No focal deficit present.     Mental Status: He is alert and oriented to person, place, and time.  Psychiatric:        Mood and Affect: Mood normal.        Behavior: Behavior normal.     ED Results / Procedures / Treatments   Labs (all labs ordered are listed, but only abnormal results are displayed) Labs Reviewed  CBC WITH DIFFERENTIAL/PLATELET - Abnormal; Notable for the following components:      Result Value   Platelets 143 (*)    All other components within normal limits  COMPREHENSIVE METABOLIC PANEL - Abnormal; Notable for the following components:   Calcium 8.8 (*)    All other components within normal limits  D-DIMER, QUANTITATIVE (NOT AT [facility]) - Abnormal; Notable for the following components:   D-Dimer, Quant >20.00 (*)    All other components within normal limits  C-REACTIVE PROTEIN - Abnormal; Notable for the following components:   CRP 5.2 (*)    All other components within normal limits  APTT - Abnormal; Notable for the following components:   aPTT 154 (*)    All other components within normal limits  HEPARIN LEVEL (UNFRACTIONATED) - Abnormal; Notable for  the following components:   Heparin Unfractionated 0.75 (*)    All other components within normal limits  CULTURE, BLOOD (ROUTINE X 2)  CULTURE, BLOOD (ROUTINE X 2)  LACTIC ACID, PLASMA  LACTIC ACID, PLASMA  PROCALCITONIN  LACTATE DEHYDROGENASE  FERRITIN  TRIGLYCERIDES  FIBRINOGEN  PROTIME-INR  BRAIN NATRIURETIC PEPTIDE  CBC  HIV ANTIBODY (ROUTINE TESTING W REFLEX)  TROPONIN I (HIGH SENSITIVITY)  TROPONIN I (HIGH SENSITIVITY)    EKG EKG Interpretation  Date/Time:  Friday July 19 2019 21:00:52 EDT Ventricular Rate:  100 PR Interval:    QRS Duration: 102 QT Interval:  334 QTC Calculation: 431 R Axis:   -42 Text Interpretation: Sinus tachycardia Left axis deviation Abnormal R-wave progression, late transition Since last tracing rate faster Confirmed by Isla Pence [phone]) on 07/20/2019 3:15:41 PM   Radiology CT Angio Chest PE W and/or Wo Contrast  Result Date: 07/19/2019 CLINICAL DATA:  High probability for pulmonary embolism. Left calf pain. COVID-19 diagnosis 2 weeks ago. Dehydration. EXAM: CT ANGIOGRAPHY CHEST WITH CONTRAST TECHNIQUE: Multidetector CT imaging of the chest was performed using the standard protocol during bolus administration of intravenous contrast. Multiplanar CT image reconstructions and MIPs were obtained to evaluate the vascular anatomy. Automatic exposure control utilized. CONTRAST:  190mL OMNIPAQUE IOHEXOL 350 MG/ML SOLN COMPARISON:  None. FINDINGS: Cardiovascular: Occlusive thrombus in the right upper lobar arteries. Additional bilateral more peripheral pulmonary emboli including nonocclusive thrombus in the right medial lower lobar pulmonary artery. Pulmonary artery hypertension, the pulmonary trunk measuring 3.8 cm diameter. Borderline cardiomegaly with mild coronary calcification. Right ventricular to left ventricular ratio 1.2, consistent with right heart strain. No pericardial fluid. Mediastinum/Nodes: Subcentimeter noncalcified benign-appearing  mediastinal lymph nodes. Small hiatal hernia. A 6 x 2 x 2 cm 22 Hounsfield unit cyst is present in the right cardiophrenic angle or slightly increased in size from 4 x 2 x 2 cm on the  July 30, 2011 study. 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 Trace bilateral pleural fluid. Central pulmonary vascular congestion without overt pulmonary edema. Subpleural atelectasis. Upper Abdomen: Benign-appearing water density right renal cortical cysts measuring 8 mm in the midpole and 35 mm in the upper pole. Mild fatty infiltration of the pancreas. Musculoskeletal: Moderate degenerative changes. Bridging anterior osteophytes in the lower thoracic spine. Review of the MIP images confirms the above findings. I discussed critical results by telephone at the time of interpretation on 07/19/2019 at 10:25 p.m. Eastern standard time with provider Isla Pence , who verbally acknowledged these results. IMPRESSION: Bilateral lobar and peripheral pulmonary arteries with right heart strain, the RV:LV ratio 1.2. Occlusive thrombus in the right upper lobar arteries. 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 Unenhanced CT chest recommended in 3 months. A benign pericardial recess cyst, slightly increased in size since 2013. No additional surveillance imaging is recommended. Borderline cardiomegaly with mild coronary calcification and pulmonary artery hypertension. Trace bilateral pleural fluid. Electronically Signed   By: Revonda Humphrey   On: 07/19/2019 22:34   DG Chest Port 1 View  Result Date: 07/19/2019 CLINICAL DATA:  Shortness of breath, COVID positive EXAM: PORTABLE CHEST 1 VIEW COMPARISON:  2012 FINDINGS: Low lung volumes. No consolidation or edema. No pleural effusion or pneumothorax cardiomediastinal silhouette is likely within normal limits for portable technique. IMPRESSION: No acute process in the chest. Electronically Signed   By: Macy Mis M.D.   On: 07/19/2019 21:05   ECHOCARDIOGRAM COMPLETE  Result Date: 07/20/2019    ECHOCARDIOGRAM REPORT   Patient Name:   MAXSEN GOLASZEWSKI Date of Exam: 07/20/2019 Medical Rec #:  [id number]      Height:       69.0 in Accession #:    [id number]     Weight:       236.3 lb Date of Birth:  1966/03/02       BSA:          2.218 m Patient Age:    85 years       BP:           141/82 mmHg Patient Gender: M              HR:           72 bpm. Exam Location:  Inpatient Procedure: 2D Echo, Cardiac Doppler and Color Doppler Indications:    I26.02 Pulmonary embolus  History:        Patient has no prior history of Echocardiogram examinations.                 CV19.  Sonographer:    Merrie Roof RDCS Referring Phys: [id number] [place]  1. Left ventricular ejection fraction, by estimation, is 60 to 65%. The left ventricle has normal function. The left ventricle has no regional wall motion abnormalities. There is mild left ventricular hypertrophy. Left ventricular diastolic parameters were normal.  2. Right ventricular systolic function is normal. The right ventricular size is normal. Tricuspid regurgitation signal is inadequate for assessing PA pressure.  3. The mitral valve is grossly normal. Trivial mitral valve regurgitation.  4. The aortic valve is tricuspid. Aortic valve regurgitation is not visualized.  5. The inferior vena cava is normal in size with greater than 50% respiratory variability, suggesting right atrial pressure of 3 mmHg. FINDINGS  Left Ventricle: Left ventricular ejection fraction, by estimation, is  60 to 65%. The left ventricle has normal function. The left ventricle has no regional wall motion abnormalities. The left ventricular internal cavity size was normal in size. There is  mild left ventricular hypertrophy. Left ventricular diastolic parameters were normal. Right Ventricle: The right ventricular size is normal. No increase in right ventricular wall thickness. Right ventricular systolic  function is normal. Tricuspid regurgitation signal is inadequate for assessing PA pressure. Left Atrium: Left atrial size was normal in size. Right Atrium: Right atrial size was normal in size. Pericardium: There is no evidence of pericardial effusion. Mitral Valve: The mitral valve is grossly normal. Trivial mitral valve regurgitation. Tricuspid Valve: The tricuspid valve is grossly normal. Tricuspid valve regurgitation is trivial. Aortic Valve: The aortic valve is tricuspid. Aortic valve regurgitation is not visualized. Mild aortic valve annular calcification. Pulmonic Valve: The pulmonic valve was grossly normal. Pulmonic valve regurgitation is not visualized. Aorta: The aortic root is normal in size and structure. Venous: The inferior vena cava is normal in size with greater than 50% respiratory variability, suggesting right atrial pressure of 3 mmHg. IAS/Shunts: No atrial level shunt detected by color flow Doppler.  LEFT VENTRICLE PLAX 2D LVIDd:         4.30 cm      Diastology LVIDs:         2.90 cm      LV e' lateral:   15.20 cm/s LV PW:         1.20 cm      LV E/e' lateral: 6.3 LV IVS:        1.30 cm      LV e' medial:    9.46 cm/s LVOT diam:     2.10 cm      LV E/e' medial:  10.1 LV SV:         68 LV SV Index:   30 LVOT Area:     3.46 cm  LV Volumes (MOD) LV vol d, MOD A4C: 122.0 ml LV vol s, MOD A4C: 38.4 ml LV SV MOD A4C:     122.0 ml RIGHT VENTRICLE          IVC RV Basal diam:  4.20 cm  IVC diam: 2.30 cm RV Mid diam:    3.50 cm LEFT ATRIUM             Index       RIGHT ATRIUM           Index LA diam:        3.50 cm 1.58 cm/m  RA Area:     18.50 cm LA Vol (A2C):   56.5 ml 25.48 ml/m RA Volume:   50.70 ml  22.86 ml/m LA Vol (A4C):   40.3 ml 18.17 ml/m LA Biplane Vol: 48.7 ml 21.96 ml/m  AORTIC VALVE LVOT Vmax:   88.70 cm/s LVOT Vmean:  61.300 cm/s LVOT VTI:    0.195 m  AORTA Ao Root diam: 2.90 cm Ao Asc diam:  3.20 cm MITRAL VALVE MV Area (PHT): 4.31 cm    SHUNTS MV Decel Time: 176 msec    Systemic  VTI:  0.20 m MV E velocity: 95.10 cm/s  Systemic Diam: 2.10 cm MV A velocity: 68.40 cm/s MV E/A ratio:  1.39 Rozann Lesches MD Electronically signed by Rozann Lesches MD Signature Date/Time: 07/20/2019/2:59:52 PM    Final     Procedures Procedures (including critical care time)  Medications Ordered in ED Medications  0.9 %  sodium chloride infusion (1,000 mLs  Intravenous New Bag/Given 07/20/19 0946)  pantoprazole (PROTONIX) EC tablet 40 mg (40 mg Oral Given 07/20/19 0941)  Melatonin TABS 5 mg (has no administration in time range)  sodium chloride flush (NS) 0.9 % injection 3 mL (3 mLs Intravenous Not Given 07/20/19 0942)  dexamethasone (DECADRON) injection 6 mg (6 mg Intravenous Given 07/20/19 0551)  guaiFENesin-dextromethorphan (ROBITUSSIN DM) 100-10 MG/5ML syrup 10 mL (has no administration in time range)  chlorpheniramine-HYDROcodone (TUSSIONEX) 10-8 MG/5ML suspension 5 mL (has no administration in time range)  acetaminophen (TYLENOL) tablet 650 mg (has no administration in time range)  HYDROcodone-acetaminophen (NORCO/VICODIN) 5-325 MG per tablet 1-2 tablet (has no administration in time range)  polyethylene glycol (MIRALAX / GLYCOLAX) packet 17 g (has no administration in time range)  ondansetron (ZOFRAN) tablet 4 mg (has no administration in time range)    Or  ondansetron (ZOFRAN) injection 4 mg (has no administration in time range)  remdesivir 100 mg in sodium chloride 0.9 % 100 mL IVPB (has no administration in time range)  heparin ADULT infusion 100 units/mL (25000 units/240mL sodium chloride 0.45%) (0 Units/hr Intravenous Stopped 07/20/19 1408)  apixaban (ELIQUIS) tablet 10 mg (10 mg Oral Given 07/20/19 1408)    Followed by  apixaban (ELIQUIS) tablet 5 mg (has no administration in time range)  dexamethasone (DECADRON) injection 10 mg (10 mg Intravenous Given 07/19/19 2238)  iohexol (OMNIPAQUE) 350 MG/ML injection 100 mL (100 mLs Intravenous Contrast Given 07/19/19 2205)  sodium  chloride (PF) 0.9 % injection (10 mLs  Given 07/19/19 2242)  acetaminophen (TYLENOL) tablet 1,000 mg (1,000 mg Oral Given 07/19/19 2238)  heparin bolus via infusion 5,000 Units (5,000 Units Intravenous Bolus from Bag 07/20/19 0011)  remdesivir 100 mg in sodium chloride 0.9 % 100 mL IVPB (0 mg Intravenous Stopping Infusion hung by another clincian 07/20/19 0559)    ED Course  I have reviewed the triage vital signs and the nursing notes.  Pertinent labs & imaging results that were available during my care of the patient were reviewed by me and considered in my medical decision making (see chart for details).  Oxygen maintained on 2L. Pt did have bilateral PE on CT with evidence of heart strain.  Troponin neg and EKG with only sinus tachy.  Pt started on heparin.  I did not think he needed tpa.  Pt given decadron and tylenol.    Pt d/w Dr. Myna Hidalgo (triad) for admission.    MDM Rules/Calculators/A&P                     ZIMRI KLOEPFER was evaluated in Emergency Department on 07/20/2019 for the symptoms described in the history of present illness. He was evaluated in the context of the global COVID-19 pandemic, which necessitated consideration that the patient might be at risk for infection with the SARS-CoV-2 virus that causes COVID-19. Institutional protocols and algorithms that pertain to the evaluation of patients at risk for COVID-19 are in a state of rapid change based on information released by regulatory bodies including the CDC and federal and state organizations. These policies and algorithms were followed during the patient's care in the ED.  CRITICAL CARE Performed by: Isla Pence   Total critical care time: 79minutes  Critical care time was exclusive of separately billable procedures and treating other patients.  Critical care was necessary to treat or prevent imminent or life-threatening deterioration.  Critical care was time spent personally by me on the following activities:  development of treatment plan with patient  and/or surrogate as well as nursing, discussions with consultants, evaluation of patient's response to treatment, examination of patient, obtaining history from patient or surrogate, ordering and performing treatments and interventions, ordering and review of laboratory studies, ordering and review of radiographic studies, pulse oximetry and re-evaluation of patient's condition. Final Clinical Impression(s) / ED Diagnoses Final diagnoses:  Bilateral pulmonary embolism (HCC)  Cor pulmonale, acute ([place])  COVID-19  Acute respiratory failure with hypoxia [facility])    Rx / DC Orders ED Discharge Orders    None       Isla Pence, MD 07/20/19 1517

## 2019-07-19 NOTE — ED Triage Notes (Signed)
Left calf pain, covid positive x 2 weeks, minimal po intake, sitting in chair long hours. 80-84% room air sats, 97% on 2L [place]. 160/80 HR 90 cbg 108 temp 99

## 2019-07-19 NOTE — Telephone Encounter (Signed)
Spoke to [name] and she wanted to let Dr. [name] know that patient's O2 is running 90-92 and cramping in left leg.

## 2019-07-19 NOTE — Telephone Encounter (Signed)
[name] stated that [name] said to monitor Pt's oxygen levels and if it becomes low to notify him. Pt's O2 levels are between 90-92. She requested to speak to CMA about it. Informed her that I will send a note back and transfer her to nurse triage for immediate assistance but she does not want to speak to them and wants to wait for [name]'s advice.   [name] can be reached at [phone]

## 2019-07-20 ENCOUNTER — Inpatient Hospital Stay (HOSPITAL_COMMUNITY): Payer: 59

## 2019-07-20 ENCOUNTER — Encounter (HOSPITAL_COMMUNITY): Payer: Self-pay | Admitting: Family Medicine

## 2019-07-20 ENCOUNTER — Other Ambulatory Visit: Payer: Self-pay

## 2019-07-20 DIAGNOSIS — U071 COVID-19: Secondary | ICD-10-CM | POA: Diagnosis present

## 2019-07-20 DIAGNOSIS — M7989 Other specified soft tissue disorders: Secondary | ICD-10-CM | POA: Insufficient documentation

## 2019-07-20 DIAGNOSIS — J9601 Acute respiratory failure with hypoxia: Secondary | ICD-10-CM | POA: Insufficient documentation

## 2019-07-20 DIAGNOSIS — I2602 Saddle embolus of pulmonary artery with acute cor pulmonale: Secondary | ICD-10-CM

## 2019-07-20 DIAGNOSIS — M79609 Pain in unspecified limb: Secondary | ICD-10-CM

## 2019-07-20 LAB — TROPONIN I (HIGH SENSITIVITY)
Troponin I (High Sensitivity): 4 ng/L (ref <18)
Troponin I (High Sensitivity): 5 ng/L (ref <18)

## 2019-07-20 LAB — CBC
HCT: 48.3 % (ref 39.0–52.0)
Hemoglobin: 16.1 g/dL (ref 13.0–17.0)
MCH: 30.8 pg (ref 26.0–34.0)
MCHC: 33.3 g/dL (ref 30.0–36.0)
MCV: 92.4 fL (ref 80.0–100.0)
Platelets: 160 10*3/uL (ref 150–400)
RBC: 5.23 MIL/uL (ref 4.22–5.81)
RDW: 12.3 % (ref 11.5–15.5)
WBC: 5.1 10*3/uL (ref 4.0–10.5)
nRBC: 0 % (ref 0.0–0.2)

## 2019-07-20 LAB — APTT: aPTT: 154 seconds — ABNORMAL HIGH (ref 24–36)

## 2019-07-20 LAB — ECHOCARDIOGRAM COMPLETE
Height: 69 in
Weight: 3780.8 oz

## 2019-07-20 LAB — HIV ANTIBODY (ROUTINE TESTING W REFLEX): HIV Screen 4th Generation wRfx: NONREACTIVE

## 2019-07-20 LAB — HEPARIN LEVEL (UNFRACTIONATED): Heparin Unfractionated: 0.75 IU/mL — ABNORMAL HIGH (ref 0.30–0.70)

## 2019-07-20 LAB — PROTIME-INR
INR: 1.1 (ref 0.8–1.2)
Prothrombin Time: 13.6 seconds (ref 11.4–15.2)

## 2019-07-20 LAB — MRSA PCR SCREENING: MRSA by PCR: NEGATIVE

## 2019-07-20 MED ORDER — ACETAMINOPHEN 325 MG PO TABS
650.0000 mg | ORAL_TABLET | Freq: Four times a day (QID) | ORAL | Status: DC | PRN
  Administered 2019-07-20 – 2019-07-24 (×7): 650 mg via ORAL
  Filled 2019-07-20 (×7): qty 2

## 2019-07-20 MED ORDER — ONDANSETRON HCL 4 MG PO TABS: 4.0000 mg | ORAL_TABLET | Freq: Four times a day (QID) | ORAL | Status: DC | PRN

## 2019-07-20 MED ORDER — SODIUM CHLORIDE 0.9 % IV SOLN: 100.0000 mg | Freq: Every day | INTRAVENOUS | Status: DC

## 2019-07-20 MED ORDER — HYDROCOD POLST-CPM POLST ER 10-8 MG/5ML PO SUER
5.0000 mL | Freq: Two times a day (BID) | ORAL | Status: DC | PRN
  Administered 2019-07-23 – 2019-07-24 (×2): 5 mL via ORAL
  Filled 2019-07-20 (×2): qty 5

## 2019-07-20 MED ORDER — SODIUM CHLORIDE 0.9 % IV SOLN
1000.0000 mL | INTRAVENOUS | Status: AC
  Administered 2019-07-20 (×2): 1000 mL via INTRAVENOUS

## 2019-07-20 MED ORDER — POLYETHYLENE GLYCOL 3350 17 G PO PACK: 17.0000 g | PACK | Freq: Every day | ORAL | Status: DC | PRN

## 2019-07-20 MED ORDER — HEPARIN (PORCINE) 25000 UT/250ML-% IV SOLN
1500.0000 [IU]/h | INTRAVENOUS | Status: AC
  Administered 2019-07-20: 1500 [IU]/h via INTRAVENOUS

## 2019-07-20 MED ORDER — PANTOPRAZOLE SODIUM 40 MG PO TBEC
40.0000 mg | DELAYED_RELEASE_TABLET | Freq: Every day | ORAL | Status: DC
  Administered 2019-07-20 – 2019-07-23 (×4): 40 mg via ORAL
  Filled 2019-07-20 (×5): qty 1

## 2019-07-20 MED ORDER — ONDANSETRON HCL 4 MG/2ML IJ SOLN: 4.0000 mg | Freq: Four times a day (QID) | INTRAMUSCULAR | Status: DC | PRN

## 2019-07-20 MED ORDER — APIXABAN 5 MG PO TABS: 5.0000 mg | ORAL_TABLET | Freq: Two times a day (BID) | ORAL | Status: DC

## 2019-07-20 MED ORDER — ATROPINE SULFATE 1 MG/10ML IJ SOSY
0.5000 mg | PREFILLED_SYRINGE | Freq: Once | INTRAMUSCULAR | Status: DC
  Filled 2019-07-20 (×2): qty 10

## 2019-07-20 MED ORDER — HYDROCODONE-ACETAMINOPHEN 5-325 MG PO TABS
1.0000 | ORAL_TABLET | ORAL | Status: DC | PRN
  Filled 2019-07-20: qty 1

## 2019-07-20 MED ORDER — SODIUM CHLORIDE 0.9 % IV SOLN
100.0000 mg | INTRAVENOUS | Status: AC
  Administered 2019-07-20 (×2): 100 mg via INTRAVENOUS
  Filled 2019-07-20 (×2): qty 20

## 2019-07-20 MED ORDER — MELATONIN 5 MG PO TABS
5.0000 mg | ORAL_TABLET | Freq: Every day | ORAL | Status: DC
  Administered 2019-07-20: 5 mg via ORAL
  Filled 2019-07-20 (×2): qty 1

## 2019-07-20 MED ORDER — APIXABAN 5 MG PO TABS
10.0000 mg | ORAL_TABLET | Freq: Two times a day (BID) | ORAL | Status: DC
  Administered 2019-07-20 (×2): 10 mg via ORAL
  Filled 2019-07-20 (×2): qty 2

## 2019-07-20 MED ORDER — SODIUM CHLORIDE 0.9 % IV SOLN: 200.0000 mg | Freq: Once | INTRAVENOUS | Status: DC

## 2019-07-20 MED ORDER — CHLORHEXIDINE GLUCONATE CLOTH 2 % EX PADS
6.0000 | MEDICATED_PAD | Freq: Every day | CUTANEOUS | Status: DC
  Administered 2019-07-20 – 2019-07-23 (×2): 6 via TOPICAL

## 2019-07-20 MED ORDER — GUAIFENESIN-DM 100-10 MG/5ML PO SYRP
10.0000 mL | ORAL_SOLUTION | ORAL | Status: DC | PRN
  Administered 2019-07-21 – 2019-07-23 (×5): 10 mL via ORAL
  Filled 2019-07-20 (×6): qty 10

## 2019-07-20 MED ORDER — ORAL CARE MOUTH RINSE
15.0000 mL | Freq: Two times a day (BID) | OROMUCOSAL | Status: DC
  Administered 2019-07-20 – 2019-07-24 (×4): 15 mL via OROMUCOSAL

## 2019-07-20 MED ORDER — DEXAMETHASONE SODIUM PHOSPHATE 10 MG/ML IJ SOLN
6.0000 mg | INTRAMUSCULAR | Status: DC
  Administered 2019-07-20 – 2019-07-24 (×5): 6 mg via INTRAVENOUS
  Filled 2019-07-20 (×5): qty 1

## 2019-07-20 MED ORDER — SODIUM CHLORIDE 0.9% FLUSH
3.0000 mL | Freq: Two times a day (BID) | INTRAVENOUS | Status: DC
  Administered 2019-07-20 – 2019-07-24 (×8): 3 mL via INTRAVENOUS

## 2019-07-20 NOTE — Progress Notes (Signed)
Pt's HR trending down to 42 this evening compared to 80-90s from this AM. Changes present on EKG also. Pt on no medication to cause decreased HR. BP remaining stable, pt asymptomatic. MD Neysa Bonito notified who ordered EKG. MD Mansy also notified- see new orders. IV Atropine at bedside and RR Notified- stated to hold Atropine at this time d/t pt's HR Increasing to 60s when transferring from chair to bed. Patient's chest shaved and pacer pads in place. Order received to transfer pt to SDU. Pt A&O, aware of concerns and plan of care- wife called by RN at bedside and updated on changes and bed change.

## 2019-07-20 NOTE — Progress Notes (Signed)
VASCULAR LAB PRELIMINARY  PRELIMINARY  PRELIMINARY  PRELIMINARY  Left lower extremity venous duplex completed.    Preliminary report:  See CV proc for preliminary results.  Messaged Dr. Neysa Bonito with results.  Sumedh Shinsato, RVT 07/20/2019, 5:30 PM

## 2019-07-20 NOTE — Progress Notes (Signed)
PROGRESS NOTE    [name]    Code Status: Full Code  [id number] DOB: 17-Mar-1966 DOA: 07/19/2019 LOS: 1 days  PCP: Eulas Post, MD CC:  Chief Complaint  Patient presents with  . Leg Pain  . Fever  . Shortness of Breath        Hospital Summary    This is a 54 year old male with history of psoriatic arthritis on Enbrel, COVID-19 diagnosis 07/10/2019 who presented to the ED with worsening cough, exertional dyspnea, left leg swelling and tenderness found to be afebrile, hypoxic to 80s on room air in ED with stable blood pressure, tachycardic with D-dimer> 20.  CTA chest concerning for PE with right heart strain and [place] hump deformity.  Started on heparin drip.  Echo ordered without evidence of right heart strain.  Changed to Eliquis p.o.  A & P   Principal Problem:   Acute pulmonary embolism (HCC) Active Problems:   COVID-19 virus infection   Left leg swelling   Acute respiratory failure with hypoxia (HCC)   1. Acute provoked submassive PE likely secondary to recent COVID-19 diagnosis a. Echo without evidence of heart strain, heart strain notable on CT scan b. Change heparin infusion to Eliquis p.o. per pharmacy c. TOC consult for medication assistance at discharge for Eliquis d. SPO2 goal >90% e. Incentive spirometry 2. Left lower extremity swelling/tenderness, suspected DVT a. Follow-up Doppler 3. COVID-19 a. Persistent symptoms x2 weeks with acutely worsened cough and DOE which is most likely related to PE b. Started on Decadron and remdesivir c. Need to consider steroid taper at discharge given history of psoriasis to prevent rebound, pustular or erythroderma flare  DVT prophylaxis: Eliquis Family Communication: Patient's wife has been updated  Disposition Plan:   Patient came from:   Home                                                                                          Anticipated d/c place: Home  Barriers to d/c: Respiratory status, lower  extremity Doppler, hopeful discharge tomorrow or Monday  Pressure injury documentation    None  Consultants  None  Procedures  None  Antibiotics   Anti-infectives (From admission, onward)   Start     Dose/Rate Route Frequency Ordered Stop   07/21/19 1000  remdesivir 100 mg in sodium chloride 0.9 % 100 mL IVPB  Status:  Discontinued     100 mg 200 mL/hr over 30 Minutes Intravenous Daily 07/20/19 0509 07/20/19 0556   07/21/19 1000  remdesivir 100 mg in sodium chloride 0.9 % 100 mL IVPB     100 mg 200 mL/hr over 30 Minutes Intravenous Daily 07/20/19 0158 07/25/19 0959   07/20/19 0509  remdesivir 200 mg in sodium chloride 0.9% 250 mL IVPB  Status:  Discontinued     200 mg 580 mL/hr over 30 Minutes Intravenous Once 07/20/19 0509 07/20/19 0556   07/20/19 0200  remdesivir 100 mg in sodium chloride 0.9 % 100 mL IVPB     100 mg 200 mL/hr over 30 Minutes Intravenous Every 1 hr x 2 07/20/19 0157 07/20/19 0559  Subjective   Patient seen and examined at bedside in no acute distress and resting comfortably. No acute events overnight. Denies any acute complaints at this time. Ambulating. Tolerating diet well.   Objective   Vitals:   07/20/19 0542 07/20/19 0546 07/20/19 0924 07/20/19 1412  BP: (!) 159/96  (!) 141/82 127/71  Pulse: 89  81 62  Resp: 19  18 18   Temp: (!) 97.5 F (36.4 C)  97.8 F (36.6 C) 97.9 F (36.6 C)  TempSrc: Oral  Oral Oral  SpO2: 97%  99% 98%  Weight:  107.2 kg    Height:  5\' 9"  (1.753 m)      Intake/Output Summary (Last 24 hours) at 07/20/2019 1533 Last data filed at 07/20/2019 0600 Gross per 24 hour  Intake 965.17 ml  Output --  Net 965.17 ml   Filed Weights   07/19/19 2057 07/20/19 0546  Weight: 107.5 kg 107.2 kg    Examination:  Physical Exam Vitals and nursing note reviewed.  Constitutional:      Appearance: Normal appearance.  HENT:     Head: Normocephalic and atraumatic.  Eyes:     Conjunctiva/sclera: Conjunctivae normal.   Cardiovascular:     Rate and Rhythm: Normal rate and regular rhythm.  Pulmonary:     Effort: Pulmonary effort is normal. No tachypnea.     Breath sounds: Normal breath sounds.     Comments: Nasal cannula Abdominal:     General: Abdomen is flat.     Palpations: Abdomen is soft.  Musculoskeletal:        General: No swelling.     Left lower leg: Tenderness present. Edema present.  Skin:    Coloration: Skin is not jaundiced or pale.  Neurological:     Mental Status: He is alert. Mental status is at baseline.  Psychiatric:        Mood and Affect: Mood normal.        Behavior: Behavior normal.     Data Reviewed: I have personally reviewed following labs and imaging studies  CBC: Recent Labs  Lab 07/19/19 2035 07/20/19 0817  WBC 8.4 5.1  NEUTROABS 6.2  --   HGB 15.9 16.1  HCT 47.6 48.3  MCV 93.2 92.4  PLT 143* 0000000   Basic Metabolic Panel: Recent Labs  Lab 07/19/19 2035  NA 137  K 3.9  CL 104  CO2 23  GLUCOSE 99  BUN 19  CREATININE 0.98  CALCIUM 8.8*   GFR: Estimated Creatinine Clearance: 102.8 mL/min (by C-G formula based on SCr of 0.98 mg/dL). Liver Function Tests: Recent Labs  Lab 07/19/19 2035  AST 22  ALT 32  ALKPHOS 75  BILITOT 1.0  PROT 7.6  ALBUMIN 4.0   No results for input(s): LIPASE, AMYLASE in the last 168 hours. No results for input(s): AMMONIA in the last 168 hours. Coagulation Profile: Recent Labs  Lab 07/20/19 0817  INR 1.1   Cardiac Enzymes: No results for input(s): CKTOTAL, CKMB, CKMBINDEX, TROPONINI in the last 168 hours. BNP (last 3 results) No results for input(s): PROBNP in the last 8760 hours. HbA1C: No results for input(s): HGBA1C in the last 72 hours. CBG: No results for input(s): GLUCAP in the last 168 hours. Lipid Profile: Recent Labs    07/19/19 2035  TRIG 132   Thyroid Function Tests: No results for input(s): TSH, T4TOTAL, FREET4, T3FREE, THYROIDAB in the last 72 hours. Anemia Panel: Recent Labs     07/19/19 2120  FERRITIN 95   Sepsis  Labs: Recent Labs  Lab 07/19/19 2035 07/19/19 2120 07/19/19 2222  PROCALCITON <0.05  --   --   LATICACIDVEN  --  1.1 1.0    Recent Results (from the past 240 hour(s))  Blood Culture (routine x 2)     Status: None (Preliminary result)   Collection Time: 07/19/19  8:22 PM   Specimen: BLOOD  Result Value Ref Range Status   Specimen Description   Final    BLOOD RIGHT ANTECUBITAL Performed at [facility], [place] [address] [place], [place] [address]    Special Requests   Final    BOTTLES DRAWN AEROBIC AND ANAEROBIC Blood Culture results may not be optimal due to an excessive volume of blood received in culture bottles Performed at [facility] [address] [place], [place] [address]    Culture   Final    NO GROWTH < 12 HOURS Performed at [facility] [address], [place], [place] [address]    Report Status PENDING  Incomplete         Radiology Studies: CT Angio Chest PE W and/or Wo Contrast  Result Date: 07/19/2019 CLINICAL DATA:  High probability for pulmonary embolism. Left calf pain. COVID-19 diagnosis 2 weeks ago. Dehydration. EXAM: CT ANGIOGRAPHY CHEST WITH CONTRAST TECHNIQUE: Multidetector CT imaging of the chest was performed using the standard protocol during bolus administration of intravenous contrast. Multiplanar CT image reconstructions and MIPs were obtained to evaluate the vascular anatomy. Automatic exposure control utilized. CONTRAST:  14mL OMNIPAQUE IOHEXOL 350 MG/ML SOLN COMPARISON:  None. FINDINGS: Cardiovascular: Occlusive thrombus in the right upper lobar arteries. Additional bilateral more peripheral pulmonary emboli including nonocclusive thrombus in the right medial lower lobar pulmonary artery. Pulmonary artery hypertension, the pulmonary trunk measuring 3.8 cm diameter. Borderline cardiomegaly with mild coronary calcification. Right ventricular to left ventricular ratio 1.2,  consistent with right heart strain. No pericardial fluid. Mediastinum/Nodes: Subcentimeter noncalcified benign-appearing mediastinal lymph nodes. Small hiatal hernia. A 6 x 2 x 2 cm 22 Hounsfield unit cyst is present in the right cardiophrenic angle or slightly increased in size from 4 x 2 x 2 cm on the July 30, 2011 study. 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 Trace bilateral pleural fluid. Central pulmonary vascular congestion without overt pulmonary edema. Subpleural atelectasis. Upper Abdomen: Benign-appearing water density right renal cortical cysts measuring 8 mm in the midpole and 35 mm in the upper pole. Mild fatty infiltration of the pancreas. Musculoskeletal: Moderate degenerative changes. Bridging anterior osteophytes in the lower thoracic spine. Review of the MIP images confirms the above findings. I discussed critical results by telephone at the time of interpretation on 07/19/2019 at 10:25 p.m. Eastern standard time with provider Isla Pence , who verbally acknowledged these results. IMPRESSION: Bilateral lobar and peripheral pulmonary arteries with right heart strain, the RV:LV ratio 1.2. Occlusive thrombus in the right upper lobar arteries. 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 Unenhanced CT chest recommended in 3 months. A benign pericardial recess cyst, slightly increased in size since 2013. No additional surveillance imaging is recommended. Borderline cardiomegaly with mild coronary calcification and pulmonary artery hypertension. Trace bilateral pleural fluid. Electronically Signed   By: Revonda Humphrey   On: 07/19/2019 22:34   DG Chest Port 1 View  Result Date: 07/19/2019 CLINICAL DATA:  Shortness of breath, COVID positive EXAM: PORTABLE CHEST 1 VIEW COMPARISON:  2012 FINDINGS: Low lung volumes. No  consolidation or edema. No pleural effusion or pneumothorax cardiomediastinal silhouette is likely  within normal limits for portable technique. IMPRESSION: No acute process in the chest. Electronically Signed   By: Macy Mis M.D.   On: 07/19/2019 21:05   ECHOCARDIOGRAM COMPLETE  Result Date: 07/20/2019    ECHOCARDIOGRAM REPORT   Patient Name:   TRENELL PARROW Date of Exam: 07/20/2019 Medical Rec #:  [id number]      Height:       69.0 in Accession #:    [id number]     Weight:       236.3 lb Date of Birth:  02-15-1966       BSA:          2.218 m Patient Age:    61 years       BP:           141/82 mmHg Patient Gender: M              HR:           72 bpm. Exam Location:  Inpatient Procedure: 2D Echo, Cardiac Doppler and Color Doppler Indications:    I26.02 Pulmonary embolus  History:        Patient has no prior history of Echocardiogram examinations.                 CV19.  Sonographer:    Merrie Roof RDCS Referring Phys: [id number] [place]  1. Left ventricular ejection fraction, by estimation, is 60 to 65%. The left ventricle has normal function. The left ventricle has no regional wall motion abnormalities. There is mild left ventricular hypertrophy. Left ventricular diastolic parameters were normal.  2. Right ventricular systolic function is normal. The right ventricular size is normal. Tricuspid regurgitation signal is inadequate for assessing PA pressure.  3. The mitral valve is grossly normal. Trivial mitral valve regurgitation.  4. The aortic valve is tricuspid. Aortic valve regurgitation is not visualized.  5. The inferior vena cava is normal in size with greater than 50% respiratory variability, suggesting right atrial pressure of 3 mmHg. FINDINGS  Left Ventricle: Left ventricular ejection fraction, by estimation, is 60 to 65%. The left ventricle has normal function. The left ventricle has no regional wall motion abnormalities. The left ventricular internal cavity size was normal in size. There is  mild left ventricular hypertrophy. Left ventricular diastolic parameters were normal. Right  Ventricle: The right ventricular size is normal. No increase in right ventricular wall thickness. Right ventricular systolic function is normal. Tricuspid regurgitation signal is inadequate for assessing PA pressure. Left Atrium: Left atrial size was normal in size. Right Atrium: Right atrial size was normal in size. Pericardium: There is no evidence of pericardial effusion. Mitral Valve: The mitral valve is grossly normal. Trivial mitral valve regurgitation. Tricuspid Valve: The tricuspid valve is grossly normal. Tricuspid valve regurgitation is trivial. Aortic Valve: The aortic valve is tricuspid. Aortic valve regurgitation is not visualized. Mild aortic valve annular calcification. Pulmonic Valve: The pulmonic valve was grossly normal. Pulmonic valve regurgitation is not visualized. Aorta: The aortic root is normal in size and structure. Venous: The inferior vena cava is normal in size with greater than 50% respiratory variability, suggesting right atrial pressure of 3 mmHg. IAS/Shunts: No atrial level shunt detected by color flow Doppler.  LEFT VENTRICLE PLAX 2D LVIDd:         4.30 cm      Diastology LVIDs:         2.90  cm      LV e' lateral:   15.20 cm/s LV PW:         1.20 cm      LV E/e' lateral: 6.3 LV IVS:        1.30 cm      LV e' medial:    9.46 cm/s LVOT diam:     2.10 cm      LV E/e' medial:  10.1 LV SV:         68 LV SV Index:   30 LVOT Area:     3.46 cm  LV Volumes (MOD) LV vol d, MOD A4C: 122.0 ml LV vol s, MOD A4C: 38.4 ml LV SV MOD A4C:     122.0 ml RIGHT VENTRICLE          IVC RV Basal diam:  4.20 cm  IVC diam: 2.30 cm RV Mid diam:    3.50 cm LEFT ATRIUM             Index       RIGHT ATRIUM           Index LA diam:        3.50 cm 1.58 cm/m  RA Area:     18.50 cm LA Vol (A2C):   56.5 ml 25.48 ml/m RA Volume:   50.70 ml  22.86 ml/m LA Vol (A4C):   40.3 ml 18.17 ml/m LA Biplane Vol: 48.7 ml 21.96 ml/m  AORTIC VALVE LVOT Vmax:   88.70 cm/s LVOT Vmean:  61.300 cm/s LVOT VTI:    0.195 m  AORTA Ao  Root diam: 2.90 cm Ao Asc diam:  3.20 cm MITRAL VALVE MV Area (PHT): 4.31 cm    SHUNTS MV Decel Time: 176 msec    Systemic VTI:  0.20 m MV E velocity: 95.10 cm/s  Systemic Diam: 2.10 cm MV A velocity: 68.40 cm/s MV E/A ratio:  1.39 Rozann Lesches MD Electronically signed by Rozann Lesches MD Signature Date/Time: 07/20/2019/2:59:52 PM    Final         Scheduled Meds: . apixaban  10 mg Oral BID   Followed by  . [START ON 07/27/2019] apixaban  5 mg Oral BID  . dexamethasone (DECADRON) injection  6 mg Intravenous Q24H  . Melatonin  5 mg Oral QHS  . pantoprazole  40 mg Oral Daily  . sodium chloride flush  3 mL Intravenous Q12H   Continuous Infusions: . [START ON 07/21/2019] remdesivir 100 mg in NS 100 mL       Time spent: 26 minutes with over 50% of the time coordinating the patient's care    Harold Hedge, DO [facility] Pager [phone]  Call night coverage person covering after 7pm

## 2019-07-20 NOTE — Progress Notes (Signed)
  Echocardiogram 2D Echocardiogram has been performed.  Merrie Roof F 07/20/2019, 2:03 PM

## 2019-07-20 NOTE — Progress Notes (Addendum)
ANTICOAGULATION CONSULT NOTE - Follow Up Consult  Pharmacy Consult for heparin Indication: acute pulmonary embolus  Allergies  Allergen Reactions  . Gold-Containing Drug Products Rash    Denies oral or airway involvement - occurred in the 1980s.     Patient Measurements: Height: 5\' 9"  (175.3 cm) Weight: 236 lb 4.8 oz (107.2 kg) IBW/kg (Calculated) : 70.7 Heparin Dosing Weight: 94 kg  Vital Signs: Temp: 97.8 F (36.6 C) (03/20 0924) Temp Source: Oral (03/20 0924) BP: 141/82 (03/20 0924) Pulse Rate: 81 (03/20 0924)  Labs: Recent Labs    07/19/19 2035 07/19/19 2222 07/20/19 0817  HGB 15.9  --  16.1  HCT 47.6  --  48.3  PLT 143*  --  160  LABPROT  --   --  13.6  INR  --   --  1.1  HEPARINUNFRC  --   --  0.75*  CREATININE 0.98  --   --   TROPONINIHS 10 15  --     Estimated Creatinine Clearance: 102.8 mL/min (by C-G formula based on SCr of 0.98 mg/dL).   Assessment: Patient's a 54 y.o M who was diagnosed with COVID one week prior to adm (07/10/19), presented to the ED on 3/19 with c/o SOB, cough, fatigue and left leg swelling. D-dimer on admission was elevated and chest CTA showed bilateral PE with right heart strain.  He was started on heparin for acute PE.  Today, 07/20/2019: - first heparin level is supra-therapeutic at 0.75 - cbc ok, no bleeding documented - good renal function   Goal of Therapy:  Heparin level 0.3-0.7 units/ml Monitor platelets by anticoagulation protocol: Yes   Plan:  - reduce heparin drip to 1500 units/hr - check 6 hr heparin level - f/u LE doppler - monitor for s/sx bleeding  Tyneisha Hegeman P 07/20/2019,9:25 AM ________________________________  Adden: pharmacy was consulted to transition patient to Eliquis - d/c heparin drip - start eliquis 10 mg bid x7 days, then 5 mg bid  Dia Sitter, PharmD, BCPS 07/20/2019 12:51 PM

## 2019-07-20 NOTE — H&P (Signed)
History and Physical    [name] [id number] DOB: December 28, 1965 DOA: 07/19/2019  PCP: Eulas Post, MD   Patient coming from: Home   Chief Complaint: DOE, fatigue, cough, left leg swelling   HPI: [name] is a 54 y.o. male with medical history significant for psoriatic arthritis on Enbrel, COVID-19 diagnosed on 07/10/2019, presenting to the emergency department with worsening cough, exertional dyspnea, and left leg swelling and tenderness.  Patient reports that he was diagnosed with COVID-19 on 07/10/2019.  Shortly after his symptoms began, he has continued to have shortness of breath, mainly with exertion, continues to have subjective fevers and aches, and his cough has significantly worsened over the past few days.  He has also developed left leg swelling and tenderness in the past couple days.  Denies any hemoptysis.  Reports a history of dark stools in the past that resolved when he stopped taking NSAIDs.  Denies any recent melena or hematochezia.  ED Course: Upon arrival to the ED, patient is found to be afebrile, saturating mid 80s on room air, and with stable blood pressure.  EKG features sinus tachycardia with rate 100 and LAD.  Chemistry panel is unremarkable.  CBC with slight thrombocytopenia.  D-dimer >20, CRP 5.2, and procalcitonin undetectable.  High-sensitivity troponin is normal x2 and BNP is normal.  CTA chest is concerning for pulmonary embolism with right heart strain and Ham[place] hump deformities.  Blood cultures were collected in the emergency department and the patient was treated with IV heparin infusion and supplemental oxygen.  Review of Systems:  All other systems reviewed and apart from HPI, are negative.  Past Medical History:  Diagnosis Date  . Anemia   . History of avascular necrosis of capital femoral epiphysis 2006, 2007   Bilateral Femoral Head  . HLD (hyperlipidemia)   . Pneumonia    twice  . Psoriatic arthritis [facility])     Past Surgical  History:  Procedure Laterality Date  . JOINT REPLACEMENT Bilateral 2006, 2007   Necrosis femoral head (bilateral)  . TOTAL HIP REVISION Right 11/23/2016   Procedure: Acetabulum liner and femoral head revision;  Surgeon: [name], MD;  Location: [facility];  Service: Orthopedics;  Laterality: Right;  . TOTAL HIP REVISION Left 04/10/2019   Procedure: Left hip bearing surface vs total hip arthroplasty revision;  Surgeon: [name], MD;  Location: [facility];  Service: Orthopedics;  Laterality: Left;  128min     reports that he has never smoked. He has never used smokeless tobacco. He reports current alcohol use. He reports that he does not use drugs.  Allergies  Allergen Reactions  . Gold-Containing Drug Products Rash    Denies oral or airway involvement - occurred in the 1980s.     Family History  Problem Relation Age of Onset  . Pneumonia Father   . Tremor Neg Hx      Prior to Admission medications   Medication Sig Start Date End Date Taking? Authorizing Provider  acetaminophen (TYLENOL) 500 MG tablet Take 1,000 mg by mouth every 8 (eight) hours as needed for mild pain or moderate pain.    Yes [provider]  cholecalciferol (VITAMIN D) 1000 units tablet Take 2,000 Units by mouth daily.    Yes [provider]  Coenzyme Q10 (COQ10) 100 MG CAPS Take 100 mg by mouth daily.    Yes [provider]  etanercept (ENBREL) 50 MG/ML injection Inject 0.98 mLs (50 mg total) into the skin once a week.  Inject 1 syringe into the skin once a week. Patient taking differently: Inject 50 mg into the skin every Sunday. Inject 1 syringe into the skin once a week. 05/08/19  Yes Jegede, [name], MD  HYDROcodone-homatropine (HYCODAN) 5-1.5 MG/5ML syrup Take 5 mLs by mouth every 6 (six) hours as needed for up to 10 days. 07/17/19 07/27/19 Yes Burchette, [name], MD  levocetirizine (XYZAL) 5 MG tablet Take 5 mg by mouth daily.   Yes [provider]  Magnesium Oxide  (MAG-200) 200 MG TABS Take 200 mg by mouth daily.   Yes [provider]  Omega-3 Fatty Acids (FISH OIL ULTRA) 1400 MG CAPS Take 1,400 mg by mouth daily.   Yes [provider]  omeprazole (PRILOSEC) 20 MG capsule Take 20 mg by mouth daily.   Yes [provider]  TURMERIC PO Take 15 mLs by mouth daily.    Yes [provider]  aspirin EC 325 MG EC tablet Take 1 tablet (325 mg total) by mouth 2 (two) times daily. Patient not taking: Reported on 06/11/2019 04/11/19   [name], PA-C  HYDROcodone-acetaminophen (NORCO/VICODIN) 5-325 MG tablet Take 1-2 tablets by mouth every 6 (six) hours as needed for severe pain. Patient not taking: Reported on 06/11/2019 04/11/19   [name], PA-C  Melatonin 5 MG CAPS Take 5 mg by mouth at bedtime.     [provider]  methocarbamol (ROBAXIN) 500 MG tablet Take 1 tablet (500 mg total) by mouth every 6 (six) hours as needed for muscle spasms. Patient not taking: Reported on 06/11/2019 04/11/19   [name], PA-C  traMADol (ULTRAM) 50 MG tablet Take 1-2 tablets (50-100 mg total) by mouth every 6 (six) hours as needed for moderate pain. Patient not taking: Reported on 06/11/2019 04/11/19   [name], PA-C    Physical Exam: Vitals:   07/19/19 2130 07/19/19 2300 07/19/19 2330 07/20/19 0030  BP: (!) 148/92 (!) 163/92 (!) 154/107 (!) 148/81  Pulse: 96 (!) 102 (!) 101 94  Resp: 17   19  Temp:      TempSrc:      SpO2: 95% 94% 96% (!) 89%  Weight:      Height:        Constitutional: NAD, calm  Eyes: PERTLA, lids and conjunctivae normal ENMT: Mucous membranes are moist. Posterior pharynx clear of any exudate or lesions.   Neck: normal, supple, no masses, no thyromegaly Respiratory: Mild tachypnea, no wheezing, no crackles. No pallor or cyanosis.  Cardiovascular: S1 & S2 heard, regular rate and rhythm. Left leg swelling, discoloration, and tenderness; leg remains soft with palpable pulses. Abdomen: No  distension, no tenderness, soft. Bowel sounds active.  Musculoskeletal: no clubbing / cyanosis. No joint deformity upper and lower extremities.   Skin: no significant rashes, lesions, ulcers. Warm, dry, well-perfused. Neurologic: No gross facial asymmetry. Sensation intact. Moving all extremities.  Psychiatric: Alert and oriented, appropriate throughout interview and exam. Calm, cooperative.    Labs and Imaging on Admission: I have personally reviewed following labs and imaging studies  CBC: Recent Labs  Lab 07/19/19 2035  WBC 8.4  NEUTROABS 6.2  HGB 15.9  HCT 47.6  MCV 93.2  PLT [id number]*   Basic Metabolic Panel: Recent Labs  Lab 07/19/19 2035  NA 137  K 3.9  CL 104  CO2 23  GLUCOSE 99  BUN 19  CREATININE 0.98  CALCIUM 8.8*   GFR: Estimated Creatinine Clearance: 102.9 mL/min (by C-G formula based on SCr of 0.98  mg/dL). Liver Function Tests: Recent Labs  Lab 07/19/19 2035  AST 22  ALT 32  ALKPHOS 75  BILITOT 1.0  PROT 7.6  ALBUMIN 4.0   No results for input(s): LIPASE, AMYLASE in the last 168 hours. No results for input(s): AMMONIA in the last 168 hours. Coagulation Profile: No results for input(s): INR, PROTIME in the last 168 hours. Cardiac Enzymes: No results for input(s): CKTOTAL, CKMB, CKMBINDEX, TROPONINI in the last 168 hours. BNP (last 3 results) No results for input(s): PROBNP in the last 8760 hours. HbA1C: No results for input(s): HGBA1C in the last 72 hours. CBG: No results for input(s): GLUCAP in the last 168 hours. Lipid Profile: Recent Labs    07/19/19 2035  TRIG 132   Thyroid Function Tests: No results for input(s): TSH, T4TOTAL, FREET4, T3FREE, THYROIDAB in the last 72 hours. Anemia Panel: Recent Labs    07/19/19 2120  FERRITIN 95   Urine analysis:    Component Value Date/Time   COLORURINE yellow 06/24/2008 0852   APPEARANCEUR Clear 06/24/2008 0852   LABSPEC 1.015 06/24/2008 0852   PHURINE 8.5 06/24/2008 0852   HGBUR negative  06/24/2008 0852   BILIRUBINUR negative 06/24/2008 0852   UROBILINOGEN 0.2 06/24/2008 0852   NITRITE negative 06/24/2008 0852   Sepsis Labs: @LABRCNTIP (procalcitonin:4,lacticidven:4) ) Recent Results (from the past 240 hour(s))  Novel Coronavirus, NAA ([facility])     Status: Abnormal   Collection Time: 07/10/19 10:47 AM   Specimen: Nasopharyngeal(NP) swabs in vial transport medium   NASOPHARYNGE  TESTING  Result Value Ref Range Status   SARS-CoV-2, NAA Detected (A) Not Detected Final    Comment: Testing was performed using the cobas(R) SARS-CoV-2 test. This nucleic acid amplification test was developed and its performance characteristics determined by [employer]. Nucleic acid amplification tests include RT-PCR and TMA. This test has not been FDA cleared or approved. This test has been authorized by FDA under an Emergency Use Authorization (EUA). This test is only authorized for the duration of time the declaration that circumstances exist justifying the authorization of the emergency use of in vitro diagnostic tests for detection of SARS-CoV-2 virus and/or diagnosis of COVID-19 infection under section 564(b)(1) of the Act, 21 U.S.C. [id number]) (1), unless the authorization is terminated or revoked sooner. When diagnostic testing is negative, the possibility of a false negative result should be considered in the context of a patient's recent exposures and the presence of clinical signs and symptoms consistent with COVID-19. An individual without sympto ms of COVID-19 and who is not shedding SARS-CoV-2 virus would expect to have a negative (not detected) result in this assay.   Blood Culture (routine x 2)     Status: None (Preliminary result)   Collection Time: 07/19/19  8:22 PM   Specimen: BLOOD  Result Value Ref Range Status   Specimen Description   Final    BLOOD RIGHT ANTECUBITAL Performed at [facility], [place] [address] [place], [place] [address]    Special  Requests   Final    BOTTLES DRAWN AEROBIC AND ANAEROBIC Blood Culture results may not be optimal due to an excessive volume of blood received in culture bottles Performed at [facility] [address], [place], [place] [address]    Culture PENDING  Incomplete   Report Status PENDING  Incomplete     Radiological Exams on Admission: CT Angio Chest PE W and/or Wo Contrast  Result Date: 07/19/2019 CLINICAL DATA:  High probability for pulmonary embolism. Left calf  pain. COVID-19 diagnosis 2 weeks ago. Dehydration. EXAM: CT ANGIOGRAPHY CHEST WITH CONTRAST TECHNIQUE: Multidetector CT imaging of the chest was performed using the standard protocol during bolus administration of intravenous contrast. Multiplanar CT image reconstructions and MIPs were obtained to evaluate the vascular anatomy. Automatic exposure control utilized. CONTRAST:  133mL OMNIPAQUE IOHEXOL 350 MG/ML SOLN COMPARISON:  None. FINDINGS: Cardiovascular: Occlusive thrombus in the right upper lobar arteries. Additional bilateral more peripheral pulmonary emboli including nonocclusive thrombus in the right medial lower lobar pulmonary artery. Pulmonary artery hypertension, the pulmonary trunk measuring 3.8 cm diameter. Borderline cardiomegaly with mild coronary calcification. Right ventricular to left ventricular ratio 1.2, consistent with right heart strain. No pericardial fluid. Mediastinum/Nodes: Subcentimeter noncalcified benign-appearing mediastinal lymph nodes. Small hiatal hernia. A 6 x 2 x 2 cm 22 Hounsfield unit cyst is present in the right cardiophrenic angle or slightly increased in size from 4 x 2 x 2 cm on the July 30, 2011 study. 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 Trace bilateral pleural fluid. Central pulmonary vascular congestion without overt pulmonary edema. Subpleural atelectasis. Upper Abdomen: Benign-appearing water density right renal cortical cysts  measuring 8 mm in the midpole and 35 mm in the upper pole. Mild fatty infiltration of the pancreas. Musculoskeletal: Moderate degenerative changes. Bridging anterior osteophytes in the lower thoracic spine. Review of the MIP images confirms the above findings. I discussed critical results by telephone at the time of interpretation on 07/19/2019 at 10:25 p.m. Eastern standard time with provider Isla Pence , who verbally acknowledged these results. IMPRESSION: Bilateral lobar and peripheral pulmonary arteries with right heart strain, the RV:LV ratio 1.2. Occlusive thrombus in the right upper lobar arteries. 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 Unenhanced CT chest recommended in 3 months. A benign pericardial recess cyst, slightly increased in size since 2013. No additional surveillance imaging is recommended. Borderline cardiomegaly with mild coronary calcification and pulmonary artery hypertension. Trace bilateral pleural fluid. Electronically Signed   By: Revonda Humphrey   On: 07/19/2019 22:34   DG Chest Port 1 View  Result Date: 07/19/2019 CLINICAL DATA:  Shortness of breath, COVID positive EXAM: PORTABLE CHEST 1 VIEW COMPARISON:  2012 FINDINGS: Low lung volumes. No consolidation or edema. No pleural effusion or pneumothorax cardiomediastinal silhouette is likely within normal limits for portable technique. IMPRESSION: No acute process in the chest. Electronically Signed   By: Macy Mis M.D.   On: 07/19/2019 21:05    EKG: Independently reviewed. Sinus tachycardia (rate 100), LAD.   Assessment/Plan   1. Acute PE  - Presents with worsening cough, DOE, and LLE swelling after recent COVID-19 diagnosis and is found to have PE with heart strain (RV:LV 1.2) and Hampton hump deformities  - He is hemodynamically stable and troponin and BNP are normal  - Continue IV heparin, check echocardiogram and LLE venous dopplers, repeat CT in 3 months    2. COVID-19  infection  - Symptomatic for almost 2 wks now, continues to have subjective fevers and aches, but presents now d/t worsening cough and DOE likely related to PE  - Continue isolation, start Decadron and remdesivir, continue supplemental O2 as needed, and trend markers     DVT prophylaxis: IV heparin  Code Status: Full  Family Communication: Discussed with patient  Disposition Plan: Likely home in 3-4 days once respiratory status improved and stable  Consults called: None  Admission status: Inpatient    Ilene Qua  Starkeisha Vanwinkle, MD [facility] Pager: See [email or web address]  If 7AM-7PM, please contact the daytime attending [email or web address]  07/20/2019, 1:45 AM

## 2019-07-20 NOTE — ED Notes (Signed)
ED TO INPATIENT HANDOFF REPORT  ED Nurse Name and Phone #: jon wled   S Name/Age/Gender [name] 54 y.o. male Room/Bed: WA05/WA05  Code Status   Code Status: Prior  Home/SNF/Other Home Patient oriented to: self, place, time and situation Is this baseline? Yes   Triage Complete: Triage complete  Chief Complaint Acute pulmonary embolism ([place]) [I26.99]  Triage Note Left calf pain, covid positive x 2 weeks, minimal po intake, sitting in chair long hours. 80-84% room air sats, 97% on 2L [place]. 160/80 HR 90 cbg 108 temp 99    Allergies Allergies  Allergen Reactions  . Gold-Containing Drug Products Rash    Denies oral or airway involvement - occurred in the 1980s.     Level of Care/Admitting Diagnosis ED Disposition    ED Disposition Condition Comment   Admit  Hospital Area: [facility] [[id number]]  Level of Care: Telemetry [5]  Admit to tele based on following criteria: Monitor for Ischemic changes  May admit patient to [facility] or [facility] if equivalent level of care is available:: Yes  Covid Evaluation: Confirmed COVID Positive  Diagnosis: Acute pulmonary embolism [facility]) [id number]  Admitting Physician: Vianne Bulls [id number]  Attending Physician: Vianne Bulls [id number]  Estimated length of stay: past midnight tomorrow  Certification:: I certify this patient will need inpatient services for at least 2 midnights       B Medical/Surgery History Past Medical History:  Diagnosis Date  . Anemia   . History of avascular necrosis of capital femoral epiphysis 2006, 2007   Bilateral Femoral Head  . HLD (hyperlipidemia)   . Pneumonia    twice  . Psoriatic arthritis [facility])    Past Surgical History:  Procedure Laterality Date  . JOINT REPLACEMENT Bilateral 2006, 2007   Necrosis femoral head (bilateral)  . TOTAL HIP REVISION Right 11/23/2016   Procedure: Acetabulum liner and femoral head revision;  Surgeon: [name], MD;  Location: [facility]  ORS;  Service: Orthopedics;  Laterality: Right;  . TOTAL HIP REVISION Left 04/10/2019   Procedure: Left hip bearing surface vs total hip arthroplasty revision;  Surgeon: [name], MD;  Location: [facility] ORS;  Service: Orthopedics;  Laterality: Left;  19min     A IV Location/Drains/Wounds Patient Lines/Drains/Airways Status   Active Line/Drains/Airways    Name:   Placement date:   Placement time:   Site:   Days:   Peripheral IV 07/19/19 Right Antecubital   07/19/19    2100    Antecubital   1   Peripheral IV 07/19/19 Right Hand   07/19/19    2130    Hand   1   Incision (Closed) 04/10/19 Hip Left   04/10/19    1512     101          Intake/Output Last 24 hours No intake or output data in the 24 hours ending 07/20/19 0434  Labs/Imaging Results for orders placed or performed during the hospital encounter of 07/19/19 (from the past 48 hour(s))  Blood Culture (routine x 2)     Status: None (Preliminary result)   Collection Time: 07/19/19  8:22 PM   Specimen: BLOOD  Result Value Ref Range   Specimen Description      BLOOD RIGHT ANTECUBITAL Performed at [facility], [place] [address] [place], [place] [address]    Special Requests      BOTTLES DRAWN AEROBIC AND ANAEROBIC Blood Culture results may not be optimal due to an  excessive volume of blood received in culture bottles Performed at [place] [address], [place], [place] [address]    Culture PENDING    Report Status PENDING   CBC WITH DIFFERENTIAL     Status: Abnormal   Collection Time: 07/19/19  8:35 PM  Result Value Ref Range   WBC 8.4 4.0 - 10.5 K/uL   RBC 5.11 4.22 - 5.81 MIL/uL   Hemoglobin 15.9 13.0 - 17.0 g/dL   HCT 47.6 39.0 - 52.0 %   MCV 93.2 80.0 - 100.0 fL   MCH 31.1 26.0 - 34.0 pg   MCHC 33.4 30.0 - 36.0 g/dL   RDW 12.5 11.5 - 15.5 %   Platelets 143 (L) 150 - 400 K/uL   nRBC 0.0 0.0 - 0.2 %   Neutrophils Relative % 74 %   Neutro Abs 6.2 1.7 - 7.7 K/uL   Lymphocytes Relative 19  %   Lymphs Abs 1.6 0.7 - 4.0 K/uL   Monocytes Relative 5 %   Monocytes Absolute 0.4 0.1 - 1.0 K/uL   Eosinophils Relative 2 %   Eosinophils Absolute 0.1 0.0 - 0.5 K/uL   Basophils Relative 0 %   Basophils Absolute 0.0 0.0 - 0.1 K/uL   Immature Granulocytes 0 %   Abs Immature Granulocytes 0.02 0.00 - 0.07 K/uL    Comment: Performed at [facility], [place] [address] [place], [place] [address]  Comprehensive metabolic panel     Status: Abnormal   Collection Time: 07/19/19  8:35 PM  Result Value Ref Range   Sodium 137 135 - 145 mmol/L   Potassium 3.9 3.5 - 5.1 mmol/L   Chloride 104 98 - 111 mmol/L   CO2 23 22 - 32 mmol/L   Glucose, Bld 99 70 - 99 mg/dL    Comment: Glucose reference range applies only to samples taken after fasting for at least 8 hours.   BUN 19 6 - 20 mg/dL   Creatinine, Ser 0.98 0.61 - 1.24 mg/dL   Calcium 8.8 (L) 8.9 - 10.3 mg/dL   Total Protein 7.6 6.5 - 8.1 g/dL   Albumin 4.0 3.5 - 5.0 g/dL   AST 22 15 - 41 U/L   ALT 32 0 - 44 U/L   Alkaline Phosphatase 75 38 - 126 U/L   Total Bilirubin 1.0 0.3 - 1.2 mg/dL   GFR calc non Af Amer >60 >60 mL/min   GFR calc Af Amer >60 >60 mL/min   Anion gap 10 5 - 15    Comment: Performed at [facility], [place] [address], [place], [place] [address]  D-dimer, quantitative     Status: Abnormal   Collection Time: 07/19/19  8:35 PM  Result Value Ref Range   D-Dimer, Quant >20.00 (H) 0.00 - 0.50 ug/mL-FEU    Comment: REPEATED TO VERIFY SPECIMEN CHECKED FOR CLOTS (NOTE) At the manufacturer cut-off of 0.50 ug/mL FEU, this assay has been documented to exclude PE with a sensitivity and negative predictive value of 97 to 99%.  At this time, this assay has not been approved by the FDA to exclude DVT/VTE. Results should be correlated with clinical presentation. Performed at [facility], [place] [address], [place], [place] [address]   Procalcitonin     Status: None   Collection  Time: 07/19/19  8:35 PM  Result Value Ref Range   Procalcitonin <0.05 ng/mL    Comment: Performed at [facility], [place] [address]  [place], [place] [address]  Lactate dehydrogenase     Status: None   Collection Time: 07/19/19  8:35 PM  Result Value Ref Range   LDH 177 98 - 192 U/L    Comment: Performed at [facility], [place] [address], [place], [place] [address]  Triglycerides     Status: None   Collection Time: 07/19/19  8:35 PM  Result Value Ref Range   Triglycerides 132 <150 mg/dL    Comment: Performed at [facility], [place] [address] [place], [place] [address]  Fibrinogen     Status: None   Collection Time: 07/19/19  8:35 PM  Result Value Ref Range   Fibrinogen 367 210 - 475 mg/dL    Comment: Performed at [facility], [place] [address] [place], [place] [address]  Troponin I (High Sensitivity)     Status: None   Collection Time: 07/19/19  8:35 PM  Result Value Ref Range   Troponin I (High Sensitivity) 10 <18 ng/L    Comment: (NOTE) Elevated high sensitivity troponin I (hsTnI) values and significant  changes across serial measurements may suggest ACS but many other  chronic and acute conditions are known to elevate hsTnI results.  Refer to the "Links" section for chest pain algorithms and additional  guidance. Performed at [facility], [place] [address] [place], [place] [address]   Lactic acid, plasma     Status: None   Collection Time: 07/19/19  9:20 PM  Result Value Ref Range   Lactic Acid, Venous 1.1 0.5 - 1.9 mmol/L    Comment: Performed at [facility], [place] [address], [place], [place] [address]  Ferritin     Status: None   Collection Time: 07/19/19  9:20 PM  Result Value Ref Range   Ferritin 95 24 - 336 ng/mL    Comment: Performed at [facility], [place] [address], [place], [place] [address]  C-reactive protein     Status: Abnormal    Collection Time: 07/19/19  9:20 PM  Result Value Ref Range   CRP 5.2 (H) <1.0 mg/dL    Comment: Performed at [facility], [place] [address], [place], [place] [address]  Brain natriuretic peptide     Status: None   Collection Time: 07/19/19  9:40 PM  Result Value Ref Range   B Natriuretic Peptide 20.1 0.0 - 100.0 pg/mL    Comment: Performed at [facility], [place] [address] [place], [place] [address]  Lactic acid, plasma     Status: None   Collection Time: 07/19/19 10:22 PM  Result Value Ref Range   Lactic Acid, Venous 1.0 0.5 - 1.9 mmol/L    Comment: Performed at [facility], [place] [address], [place], [place] [address]  Troponin I (High Sensitivity)     Status: None   Collection Time: 07/19/19 10:22 PM  Result Value Ref Range   Troponin I (High Sensitivity) 15 <18 ng/L    Comment: (NOTE) Elevated high sensitivity troponin I (hsTnI) values and significant  changes across serial measurements may suggest ACS but many other  chronic and acute conditions are known to elevate hsTnI results.  Refer to the "Links" section for chest pain algorithms and additional  guidance. Performed at [facility], [place] [address], [place], [place] [address]    CT Angio Chest PE W and/or Wo Contrast  Result Date: 07/19/2019 CLINICAL DATA:  High probability for pulmonary embolism. Left calf pain.  COVID-19 diagnosis 2 weeks ago. Dehydration. EXAM: CT ANGIOGRAPHY CHEST WITH CONTRAST TECHNIQUE: Multidetector CT imaging of the chest was performed using the standard protocol during bolus administration of intravenous contrast. Multiplanar CT image reconstructions and MIPs were obtained to evaluate the vascular anatomy. Automatic exposure control utilized. CONTRAST:  159mL OMNIPAQUE IOHEXOL 350 MG/ML SOLN COMPARISON:  None. FINDINGS: Cardiovascular: Occlusive thrombus in the right upper lobar arteries. Additional bilateral more peripheral  pulmonary emboli including nonocclusive thrombus in the right medial lower lobar pulmonary artery. Pulmonary artery hypertension, the pulmonary trunk measuring 3.8 cm diameter. Borderline cardiomegaly with mild coronary calcification. Right ventricular to left ventricular ratio 1.2, consistent with right heart strain. No pericardial fluid. Mediastinum/Nodes: Subcentimeter noncalcified benign-appearing mediastinal lymph nodes. Small hiatal hernia. A 6 x 2 x 2 cm 22 Hounsfield unit cyst is present in the right cardiophrenic angle or slightly increased in size from 4 x 2 x 2 cm on the July 30, 2011 study. 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 Trace bilateral pleural fluid. Central pulmonary vascular congestion without overt pulmonary edema. Subpleural atelectasis. Upper Abdomen: Benign-appearing water density right renal cortical cysts measuring 8 mm in the midpole and 35 mm in the upper pole. Mild fatty infiltration of the pancreas. Musculoskeletal: Moderate degenerative changes. Bridging anterior osteophytes in the lower thoracic spine. Review of the MIP images confirms the above findings. I discussed critical results by telephone at the time of interpretation on 07/19/2019 at 10:25 p.m. Eastern standard time with provider Isla Pence , who verbally acknowledged these results. IMPRESSION: Bilateral lobar and peripheral pulmonary arteries with right heart strain, the RV:LV ratio 1.2. Occlusive thrombus in the right upper lobar arteries. 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 Unenhanced CT chest recommended in 3 months. A benign pericardial recess cyst, slightly increased in size since 2013. No additional surveillance imaging is recommended. Borderline cardiomegaly with mild coronary calcification and pulmonary artery hypertension. Trace bilateral pleural fluid. Electronically Signed   By: Revonda Humphrey   On: 07/19/2019 22:34    DG Chest Port 1 View  Result Date: 07/19/2019 CLINICAL DATA:  Shortness of breath, COVID positive EXAM: PORTABLE CHEST 1 VIEW COMPARISON:  2012 FINDINGS: Low lung volumes. No consolidation or edema. No pleural effusion or pneumothorax cardiomediastinal silhouette is likely within normal limits for portable technique. IMPRESSION: No acute process in the chest. Electronically Signed   By: Macy Mis M.D.   On: 07/19/2019 21:05    Pending Labs Unresulted Labs (From admission, onward)    Start     Ordered   07/20/19 0700  Heparin level (unfractionated)  Once-Timed,   STAT     07/20/19 0038   07/20/19 0700  CBC  Daily,   R     07/20/19 0038   07/19/19 2242  APTT  ONCE - STAT,   STAT     07/19/19 2241   07/19/19 2242  Protime-INR  ONCE - STAT,   STAT     07/19/19 2241   07/19/19 2022  Blood Culture (routine x 2)  BLOOD CULTURE X 2,   STAT     07/19/19 2022   Signed and Held  HIV Antibody (routine testing w rflx)  (HIV Antibody (Routine testing w reflex) panel)  Tomorrow morning,   R     Signed and Held   Signed and Held  CBC with Differential/Platelet  Daily,   R     Signed and Held  Signed and Held  Comprehensive metabolic panel  Daily,   R     Signed and Held   Signed and Held  C-reactive protein  Daily,   R     Signed and Held   Signed and Held  D-dimer, quantitative (not at [facility])  Daily,   R     Signed and Held   Signed and Held  Magnesium  Tomorrow morning,   R     Signed and Held   Signed and Held  Ferritin  Daily,   R     Signed and Held          Vitals/Pain Today's Vitals   07/19/19 2300 07/19/19 2330 07/20/19 0030 07/20/19 0135  BP: (!) 163/92 (!) 154/107 (!) 148/81   Pulse: (!) 102 (!) 101 94   Resp:   19   Temp:      TempSrc:      SpO2: 94% 96% (!) 89%   Weight:      Height:      PainSc:    3     Isolation Precautions Airborne and Contact precautions  Medications Medications  0.9 %  sodium chloride infusion (1,000 mLs Intravenous New Bag/Given  07/19/19 2240)  heparin ADULT infusion 100 units/mL (25000 units/267mL sodium chloride 0.45%) (1,600 Units/hr Intravenous New Bag/Given 07/20/19 0000)  remdesivir 100 mg in sodium chloride 0.9 % 100 mL IVPB (0 mg Intravenous Stopped 07/20/19 0432)  remdesivir 100 mg in sodium chloride 0.9 % 100 mL IVPB (has no administration in time range)  dexamethasone (DECADRON) injection 10 mg (10 mg Intravenous Given 07/19/19 2238)  iohexol (OMNIPAQUE) 350 MG/ML injection 100 mL (100 mLs Intravenous Contrast Given 07/19/19 2205)  sodium chloride (PF) 0.9 % injection (10 mLs  Given 07/19/19 2242)  acetaminophen (TYLENOL) tablet 1,000 mg (1,000 mg Oral Given 07/19/19 2238)  heparin bolus via infusion 5,000 Units (5,000 Units Intravenous Bolus from Bag 07/20/19 0011)    Mobility walks Low fall risk   Focused Assessments Pulmonary Assessment Handoff:  Lung sounds: L Breath Sounds: Clear R Breath Sounds: Clear O2 Device: Room Air        R Recommendations: See Admitting Provider Note  Report given to:   Additional Notes:

## 2019-07-20 NOTE — Progress Notes (Signed)
Pt became bradycardic tonight, asymptomaticatic,  ekg with sinus brady to 47, ordered trop I X2 and prn Atropine and pacer pads. ECHO nl. If persists will transfer to SDU and he will liekly need card consult in am

## 2019-07-21 ENCOUNTER — Inpatient Hospital Stay (HOSPITAL_COMMUNITY): Payer: 59

## 2019-07-21 DIAGNOSIS — I2609 Other pulmonary embolism with acute cor pulmonale: Secondary | ICD-10-CM | POA: Diagnosis not present

## 2019-07-21 DIAGNOSIS — D696 Thrombocytopenia, unspecified: Secondary | ICD-10-CM | POA: Diagnosis not present

## 2019-07-21 DIAGNOSIS — I82442 Acute embolism and thrombosis of left tibial vein: Secondary | ICD-10-CM | POA: Diagnosis not present

## 2019-07-21 DIAGNOSIS — R001 Bradycardia, unspecified: Secondary | ICD-10-CM

## 2019-07-21 DIAGNOSIS — I82412 Acute embolism and thrombosis of left femoral vein: Secondary | ICD-10-CM | POA: Diagnosis not present

## 2019-07-21 DIAGNOSIS — J9601 Acute respiratory failure with hypoxia: Secondary | ICD-10-CM | POA: Diagnosis not present

## 2019-07-21 DIAGNOSIS — L4050 Arthropathic psoriasis, unspecified: Secondary | ICD-10-CM | POA: Diagnosis not present

## 2019-07-21 DIAGNOSIS — I82432 Acute embolism and thrombosis of left popliteal vein: Secondary | ICD-10-CM | POA: Diagnosis not present

## 2019-07-21 DIAGNOSIS — U071 COVID-19: Secondary | ICD-10-CM | POA: Diagnosis not present

## 2019-07-21 DIAGNOSIS — I82452 Acute embolism and thrombosis of left peroneal vein: Secondary | ICD-10-CM | POA: Diagnosis not present

## 2019-07-21 LAB — COMPREHENSIVE METABOLIC PANEL
ALT: 27 U/L (ref 0–44)
AST: 19 U/L (ref 15–41)
Albumin: 3.4 g/dL — ABNORMAL LOW (ref 3.5–5.0)
Alkaline Phosphatase: 60 U/L (ref 38–126)
Anion gap: 9 (ref 5–15)
BUN: 26 mg/dL — ABNORMAL HIGH (ref 6–20)
CO2: 23 mmol/L (ref 22–32)
Calcium: 8.8 mg/dL — ABNORMAL LOW (ref 8.9–10.3)
Chloride: 108 mmol/L (ref 98–111)
Creatinine, Ser: 0.93 mg/dL (ref 0.61–1.24)
GFR calc Af Amer: >60 mL/min (ref >60)
GFR calc non Af Amer: >60 mL/min (ref >60)
Glucose, Bld: 129 mg/dL — ABNORMAL HIGH (ref 70–99)
Potassium: 4.5 mmol/L (ref 3.5–5.1)
Sodium: 140 mmol/L (ref 135–145)
Total Bilirubin: 0.7 mg/dL (ref 0.3–1.2)
Total Protein: 6.6 g/dL (ref 6.5–8.1)

## 2019-07-21 LAB — CBC
HCT: 40.7 % (ref 39.0–52.0)
Hemoglobin: 13.5 g/dL (ref 13.0–17.0)
MCH: 30.5 pg (ref 26.0–34.0)
MCHC: 33.2 g/dL (ref 30.0–36.0)
MCV: 91.9 fL (ref 80.0–100.0)
Platelets: 147 10*3/uL — ABNORMAL LOW (ref 150–400)
RBC: 4.43 MIL/uL (ref 4.22–5.81)
RDW: 12.5 % (ref 11.5–15.5)
WBC: 8.9 10*3/uL (ref 4.0–10.5)
nRBC: 0 % (ref 0.0–0.2)

## 2019-07-21 LAB — D-DIMER, QUANTITATIVE: D-Dimer, Quant: 7.74 ug/mL-FEU — ABNORMAL HIGH (ref 0.00–0.50)

## 2019-07-21 LAB — C-REACTIVE PROTEIN: CRP: 6.4 mg/dL — ABNORMAL HIGH (ref <1.0)

## 2019-07-21 LAB — MAGNESIUM: Magnesium: 2 mg/dL (ref 1.7–2.4)

## 2019-07-21 LAB — HEPARIN LEVEL (UNFRACTIONATED): Heparin Unfractionated: 1.98 IU/mL — ABNORMAL HIGH (ref 0.30–0.70)

## 2019-07-21 LAB — APTT: aPTT: 70 seconds — ABNORMAL HIGH (ref 24–36)

## 2019-07-21 LAB — FERRITIN: Ferritin: 145 ng/mL (ref 24–336)

## 2019-07-21 MED ORDER — IOHEXOL 350 MG/ML SOLN
150.0000 mL | Freq: Once | INTRAVENOUS | Status: AC | PRN
  Administered 2019-07-21: 150 mL via INTRAVENOUS

## 2019-07-21 MED ORDER — MELATONIN 3 MG PO TABS
6.0000 mg | ORAL_TABLET | Freq: Every day | ORAL | Status: DC
  Administered 2019-07-22 – 2019-07-23 (×2): 6 mg via ORAL
  Filled 2019-07-21 (×3): qty 2

## 2019-07-21 MED ORDER — HYDRALAZINE HCL 20 MG/ML IJ SOLN
10.0000 mg | Freq: Four times a day (QID) | INTRAMUSCULAR | Status: DC | PRN
  Administered 2019-07-21: 10 mg via INTRAVENOUS
  Filled 2019-07-21: qty 1

## 2019-07-21 MED ORDER — HEPARIN (PORCINE) 25000 UT/250ML-% IV SOLN
1200.0000 [IU]/h | INTRAVENOUS | Status: AC
  Administered 2019-07-21 – 2019-07-22 (×2): 1500 [IU]/h via INTRAVENOUS
  Administered 2019-07-22 – 2019-07-23 (×2): 1200 [IU]/h via INTRAVENOUS
  Filled 2019-07-21 (×3): qty 250

## 2019-07-21 NOTE — Progress Notes (Signed)
ANTICOAGULATION CONSULT NOTE - Initial Consult  Pharmacy Consult for heparin Indication: pulmonary embolus  Allergies  Allergen Reactions  . Gold-Containing Drug Products Rash    Denies oral or airway involvement - occurred in the 1980s.     Patient Measurements: Height: 5\' 9"  (175.3 cm) Weight: 244 lb 0.8 oz (110.7 kg) IBW/kg (Calculated) : 70.7 Heparin Dosing Weight: 94kg  Vital Signs: Temp: 98.5 F (36.9 C) (03/21 0500) Temp Source: Axillary (03/21 0500) BP: 140/63 (03/21 0600) Pulse Rate: 40 (03/21 0600)  Labs: Recent Labs    07/19/19 2035 07/19/19 2035 07/19/19 2222 07/20/19 0817 07/20/19 1949 07/20/19 2153 07/21/19 0233  HGB 15.9   < >  --  16.1  --   --  13.5  HCT 47.6  --   --  48.3  --   --  40.7  PLT 143*  --   --  160  --   --  147*  APTT  --   --   --  154*  --   --   --   LABPROT  --   --   --  13.6  --   --   --   INR  --   --   --  1.1  --   --   --   HEPARINUNFRC  --   --   --  0.75*  --   --   --   CREATININE 0.98  --   --   --   --   --  0.93  TROPONINIHS 10   < > 15  --  4 5  --    < > = values in this interval not displayed.    Estimated Creatinine Clearance: 110.1 mL/min (by C-G formula based on SCr of 0.93 mg/dL).   Medical History: Past Medical History:  Diagnosis Date  . Anemia   . History of avascular necrosis of capital femoral epiphysis 2006, 2007   Bilateral Femoral Head  . HLD (hyperlipidemia)   . Pneumonia    twice  . Psoriatic arthritis [facility])       Assessment: Patient's a 54 y.o M who was diagnosed with COVID one week prior to adm (07/10/19), presented to the ED on 3/19 with c/o SOB, cough, fatigue and left leg swelling. D-dimer on admission was elevated and chest CTA showed bilateral PE with right heart strain.  He was started on heparin for acute PE on 3/19.  Significant Events: 3/20 heparin stopped and apixaban started, pt received 2 doses, LD 2234 3/21 pharmacy consulted to resume heparin incase pt needs cards  procedure  07/21/2019 CBC WNL  Goal of Therapy:  Heparin level 0.3-0.7 units/ml  APTT 66-102 seconds Monitor platelets by anticoagulation protocol   Plan:  At 1030 start heparin drip at 1500 units/hr APTT and HL in 6 hours Daily CBC F/u when to resume eliquis  Dolly Rias RPh 07/21/2019, 7:24 AM

## 2019-07-21 NOTE — Progress Notes (Addendum)
PROGRESS NOTE    JAGGER FADLEY    Code Status: Full Code  [id number] DOB: June 27, 1965 DOA: 07/19/2019 LOS: 2 days  PCP: Eulas Post, MD CC:  Chief Complaint  Patient presents with  . Leg Pain  . Fever  . Shortness of Breath        Hospital Summary    This is a 54 year old male with history of psoriatic arthritis on Enbrel, COVID-19 diagnosis 07/10/2019 who presented to the ED with worsening cough, exertional dyspnea, left leg swelling and tenderness found to be afebrile, hypoxic to 80s on room air in ED with stable blood pressure, tachycardic with D-dimer> 20.  CTA chest concerning for PE with right heart strain and [place] hump deformity.  Started on heparin drip.  Echo ordered without evidence of right heart strain.  Changed to Eliquis p.o.. LLE [place] positive for extensive femoral-peroneal DVT.   3/20-21: asymptomatic bradycardia to 40s. Thought to be secondary to Remdesivir. Remdesivir discontinued and telemetry continued. Eliquis changed to heparin in case patient has bradycardia again and cardiac procedure is warranted.   Addendum 3/21 4:40 PM: Discussed with Dr. [name] Ferrari, IR, recommends transfer to [facility] for possible LLE thrombectomy.   A & P   Principal Problem:   Acute pulmonary embolism (HCC) Active Problems:   COVID-19 virus infection   Left leg swelling   Acute respiratory failure with hypoxia (HCC)   1. Acute provoked submassive PE likely secondary to recent COVID-19 diagnosis a. Echo without evidence of heart strain, heart strain notable on CT scan b. Still on 2L/min [place] c. Initially heparin infusion changed to eliquis but switched back to heparin drip on 3/21, as below d. TOC consult for medication assistance at discharge for Eliquis e. SPO2 goal >90% f. Incentive spirometry  2. Asymptomatic Bradycardia, thought secondary to remdesivir a. Moved to SDU overnight, remained hemodynamically stable, hypertensive if anything b. HR currently  60s-70s c. Troponin neg x 2, Echo without evidence of wall motion abnormalities d. Will hold Remdesivir and continue to monitor on telemetry e. Atropine at bedside, but has not required f. Will hold off on Cardiology consult for now as HR has improved and patient has remained asymptomatic. If he becomes symptomatic, unstable, or persistent bradycardia then will consult cardiology g. Eliquis changed to Heparin for now in case patient needs to go for unexpected urgent/emergent cardiac procedure  3. Left lower extremity femoral, popliteal, posterior tibial and peroneal vein DVT a. Heparin infusion as above and switch to PO at discharge b. Will discuss with IR if intervention is warranted Addendum 3/21 4:40 PM: Discussed with Dr. [name] Ferrari, IR, recommends transfer to [facility] for possible LLE thrombectomy.  4. COVID-19 a. Persistent symptoms x2 weeks with acutely worsened cough and DOE which is most likely related to PE b. Day 3/10 dexamethasone, completed 2 days remdesivir.  c. Multiple case studies have shown that patients have had asymptomatic bradycardia as well as QT prolongation while on remdesivir for COVID 19, therefore will hold remdesivir for now and monitor as above.  d. Need to consider steroid taper at discharge given history of psoriasis to prevent rebound, pustular or erythroderma flare   5. Hypertension a. Start PRN Hydralazine, may have rebound tachycardia which may be helpful in above setting  DVT prophylaxis: Heparin Family Communication: Patient's wife has been updated by phone Disposition Plan:   Patient came from:   Home  Anticipated d/c place: Home  Barriers to d/c: Stabilization of HR, IR discussion  Pressure injury documentation    None  Consultants  None  Procedures  None  Antibiotics   Anti-infectives (From admission, onward)   Start     Dose/Rate Route Frequency  Ordered Stop   07/21/19 1000  remdesivir 100 mg in sodium chloride 0.9 % 100 mL IVPB  Status:  Discontinued     100 mg 200 mL/hr over 30 Minutes Intravenous Daily 07/20/19 0509 07/20/19 0556   07/21/19 1000  remdesivir 100 mg in sodium chloride 0.9 % 100 mL IVPB  Status:  Discontinued     100 mg 200 mL/hr over 30 Minutes Intravenous Daily 07/20/19 0158 07/21/19 0858   07/20/19 0509  remdesivir 200 mg in sodium chloride 0.9% 250 mL IVPB  Status:  Discontinued     200 mg 580 mL/hr over 30 Minutes Intravenous Once 07/20/19 0509 07/20/19 0556   07/20/19 0200  remdesivir 100 mg in sodium chloride 0.9 % 100 mL IVPB     100 mg 200 mL/hr over 30 Minutes Intravenous Every 1 hr x 2 07/20/19 0157 07/20/19 0559        Subjective   Yesterday HR slowly downtrended from 90s-40s, asymptomatically and hemodynamically stable. EKG performed which showed sinus bradycardia and patient was transferred to SDU.   Currently he is without any chest pain, palpitations, or any other complaints.   Objective   Vitals:   07/21/19 0500 07/21/19 0600 07/21/19 0700 07/21/19 0841  BP: (!) 115/54 140/63 (!) 157/69 (!) 194/78  Pulse: (!) 41 (!) 40 (!) 49 64  Resp:  15 11 17   Temp: 98.5 F (36.9 C)   97.6 F (36.4 C)  TempSrc: Axillary   Oral  SpO2: 97% 97% 100% 100%  Weight:      Height:        Intake/Output Summary (Last 24 hours) at 07/21/2019 0920 Last data filed at 07/21/2019 [id number] Gross per 24 hour  Intake 200 ml  Output 1145 ml  Net -945 ml   Filed Weights   07/19/19 2057 07/20/19 0546 07/20/19 2100  Weight: 107.5 kg 107.2 kg 110.7 kg    Examination:  Physical Exam Vitals and nursing note reviewed.  Constitutional:      General: He is not in acute distress.    Appearance: He is not toxic-appearing.  HENT:     Head: Normocephalic.  Cardiovascular:     Rate and Rhythm: Normal rate and regular rhythm.     Pulses: Normal pulses.  Pulmonary:     Effort: Pulmonary effort is normal. No  tachypnea.     Comments: On nasal canula Musculoskeletal:     Comments: LLE swelling, erythema   Skin:    General: Skin is warm.     Coloration: Skin is not pale.     Findings: No rash.  Neurological:     Mental Status: He is alert. Mental status is at baseline.  Psychiatric:        Mood and Affect: Mood normal.        Behavior: Behavior normal.     Data Reviewed: I have personally reviewed following labs and imaging studies  CBC: Recent Labs  Lab 07/19/19 2035 07/20/19 0817 07/21/19 0233  WBC 8.4 5.1 8.9  NEUTROABS 6.2  --   --   HGB 15.9 16.1 13.5  HCT 47.6 48.3 40.7  MCV 93.2 92.4 91.9  PLT 143* 160 [id number]*   Basic Metabolic Panel: Recent Labs  Lab 07/19/19 2035 07/21/19 0233  NA 137 140  K 3.9 4.5  CL 104 108  CO2 23 23  GLUCOSE 99 129*  BUN 19 26*  CREATININE 0.98 0.93  CALCIUM 8.8* 8.8*  MG  --  2.0   GFR: Estimated Creatinine Clearance: 110.1 mL/min (by C-G formula based on SCr of 0.93 mg/dL). Liver Function Tests: Recent Labs  Lab 07/19/19 2035 07/21/19 0233  AST 22 19  ALT 32 27  ALKPHOS 75 60  BILITOT 1.0 0.7  PROT 7.6 6.6  ALBUMIN 4.0 3.4*   No results for input(s): LIPASE, AMYLASE in the last 168 hours. No results for input(s): AMMONIA in the last 168 hours. Coagulation Profile: Recent Labs  Lab 07/20/19 0817  INR 1.1   Cardiac Enzymes: No results for input(s): CKTOTAL, CKMB, CKMBINDEX, TROPONINI in the last 168 hours. BNP (last 3 results) No results for input(s): PROBNP in the last 8760 hours. HbA1C: No results for input(s): HGBA1C in the last 72 hours. CBG: No results for input(s): GLUCAP in the last 168 hours. Lipid Profile: Recent Labs    07/19/19 2035  TRIG 132   Thyroid Function Tests: No results for input(s): TSH, T4TOTAL, FREET4, T3FREE, THYROIDAB in the last 72 hours. Anemia Panel: Recent Labs    07/19/19 2120 07/21/19 0233  FERRITIN 95 145   Sepsis Labs: Recent Labs  Lab 07/19/19 2035 07/19/19 2120  07/19/19 2222  PROCALCITON <0.05  --   --   LATICACIDVEN  --  1.1 1.0    Recent Results (from the past 240 hour(s))  Blood Culture (routine x 2)     Status: None (Preliminary result)   Collection Time: 07/19/19  8:22 PM   Specimen: BLOOD  Result Value Ref Range Status   Specimen Description   Final    BLOOD RIGHT ANTECUBITAL Performed at [facility], [place] [address] [place], [place] [address]    Special Requests   Final    BOTTLES DRAWN AEROBIC AND ANAEROBIC Blood Culture results may not be optimal due to an excessive volume of blood received in culture bottles Performed at [place] [address] [place], [place] [address]    Culture   Final    NO GROWTH 2 DAYS Performed at [facility] [address] [place], [place] [address]    Report Status PENDING  Incomplete  Blood Culture (routine x 2)     Status: None (Preliminary result)   Collection Time: 07/19/19  8:27 PM   Specimen: BLOOD  Result Value Ref Range Status   Specimen Description   Final    BLOOD RIGHT HAND Performed at [facility] [address], [place], [place] [address]    Special Requests   Final    BOTTLES DRAWN AEROBIC AND ANAEROBIC Blood Culture results may not be optimal due to an excessive volume of blood received in culture bottles Performed at [facility] [address], [place], [place] [address]    Culture   Final    NO GROWTH 1 DAY Performed at [facility], [place] [address], [place], [place] [address]    Report Status PENDING  Incomplete  MRSA PCR Screening     Status: None   Collection Time: 07/20/19  8:48 PM   Specimen: Nasopharyngeal  Result Value Ref Range Status   MRSA by PCR NEGATIVE NEGATIVE Final    Comment:        The GeneXpert MRSA Assay (  FDA approved for NASAL specimens only), is one component of a comprehensive MRSA colonization surveillance program. It is not intended to diagnose  MRSA infection nor to guide or monitor treatment for MRSA infections. Performed at [facility], [place] [address] [place], [place] [address]          Radiology Studies: CT Angio Chest PE W and/or Wo Contrast  Result Date: 07/19/2019 CLINICAL DATA:  High probability for pulmonary embolism. Left calf pain. COVID-19 diagnosis 2 weeks ago. Dehydration. EXAM: CT ANGIOGRAPHY CHEST WITH CONTRAST TECHNIQUE: Multidetector CT imaging of the chest was performed using the standard protocol during bolus administration of intravenous contrast. Multiplanar CT image reconstructions and MIPs were obtained to evaluate the vascular anatomy. Automatic exposure control utilized. CONTRAST:  166mL OMNIPAQUE IOHEXOL 350 MG/ML SOLN COMPARISON:  None. FINDINGS: Cardiovascular: Occlusive thrombus in the right upper lobar arteries. Additional bilateral more peripheral pulmonary emboli including nonocclusive thrombus in the right medial lower lobar pulmonary artery. Pulmonary artery hypertension, the pulmonary trunk measuring 3.8 cm diameter. Borderline cardiomegaly with mild coronary calcification. Right ventricular to left ventricular ratio 1.2, consistent with right heart strain. No pericardial fluid. Mediastinum/Nodes: Subcentimeter noncalcified benign-appearing mediastinal lymph nodes. Small hiatal hernia. A 6 x 2 x 2 cm 22 Hounsfield unit cyst is present in the right cardiophrenic angle or slightly increased in size from 4 x 2 x 2 cm on the July 30, 2011 study. 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 Trace bilateral pleural fluid. Central pulmonary vascular congestion without overt pulmonary edema. Subpleural atelectasis. Upper Abdomen: Benign-appearing water density right renal cortical cysts measuring 8 mm in the midpole and 35 mm in the upper pole. Mild fatty infiltration of the pancreas. Musculoskeletal: Moderate degenerative changes. Bridging anterior  osteophytes in the lower thoracic spine. Review of the MIP images confirms the above findings. I discussed critical results by telephone at the time of interpretation on 07/19/2019 at 10:25 p.m. Eastern standard time with provider Isla Pence , who verbally acknowledged these results. IMPRESSION: Bilateral lobar and peripheral pulmonary arteries with right heart strain, the RV:LV ratio 1.2. Occlusive thrombus in the right upper lobar arteries. 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 Unenhanced CT chest recommended in 3 months. A benign pericardial recess cyst, slightly increased in size since 2013. No additional surveillance imaging is recommended. Borderline cardiomegaly with mild coronary calcification and pulmonary artery hypertension. Trace bilateral pleural fluid. Electronically Signed   By: Revonda Humphrey   On: 07/19/2019 22:34   DG Chest Port 1 View  Result Date: 07/19/2019 CLINICAL DATA:  Shortness of breath, COVID positive EXAM: PORTABLE CHEST 1 VIEW COMPARISON:  2012 FINDINGS: Low lung volumes. No consolidation or edema. No pleural effusion or pneumothorax cardiomediastinal silhouette is likely within normal limits for portable technique. IMPRESSION: No acute process in the chest. Electronically Signed   By: Macy Mis M.D.   On: 07/19/2019 21:05   ECHOCARDIOGRAM COMPLETE  Result Date: 07/20/2019    ECHOCARDIOGRAM REPORT   Patient Name:   YORDIN MAIS Date of Exam: 07/20/2019 Medical Rec #:  [id number]      Height:       69.0 in Accession #:    [id number]     Weight:       236.3 lb Date of Birth:  05-16-1965       BSA:          2.218 m Patient Age:  53 years       BP:           141/82 mmHg Patient Gender: M              HR:           72 bpm. Exam Location:  Inpatient Procedure: 2D Echo, Cardiac Doppler and Color Doppler Indications:    I26.02 Pulmonary embolus  History:        Patient has no prior history of Echocardiogram examinations.                  CV19.  Sonographer:    Merrie Roof RDCS Referring Phys: [id number] [place]  1. Left ventricular ejection fraction, by estimation, is 60 to 65%. The left ventricle has normal function. The left ventricle has no regional wall motion abnormalities. There is mild left ventricular hypertrophy. Left ventricular diastolic parameters were normal.  2. Right ventricular systolic function is normal. The right ventricular size is normal. Tricuspid regurgitation signal is inadequate for assessing PA pressure.  3. The mitral valve is grossly normal. Trivial mitral valve regurgitation.  4. The aortic valve is tricuspid. Aortic valve regurgitation is not visualized.  5. The inferior vena cava is normal in size with greater than 50% respiratory variability, suggesting right atrial pressure of 3 mmHg. FINDINGS  Left Ventricle: Left ventricular ejection fraction, by estimation, is 60 to 65%. The left ventricle has normal function. The left ventricle has no regional wall motion abnormalities. The left ventricular internal cavity size was normal in size. There is  mild left ventricular hypertrophy. Left ventricular diastolic parameters were normal. Right Ventricle: The right ventricular size is normal. No increase in right ventricular wall thickness. Right ventricular systolic function is normal. Tricuspid regurgitation signal is inadequate for assessing PA pressure. Left Atrium: Left atrial size was normal in size. Right Atrium: Right atrial size was normal in size. Pericardium: There is no evidence of pericardial effusion. Mitral Valve: The mitral valve is grossly normal. Trivial mitral valve regurgitation. Tricuspid Valve: The tricuspid valve is grossly normal. Tricuspid valve regurgitation is trivial. Aortic Valve: The aortic valve is tricuspid. Aortic valve regurgitation is not visualized. Mild aortic valve annular calcification. Pulmonic Valve: The pulmonic valve was grossly normal. Pulmonic valve regurgitation is  not visualized. Aorta: The aortic root is normal in size and structure. Venous: The inferior vena cava is normal in size with greater than 50% respiratory variability, suggesting right atrial pressure of 3 mmHg. IAS/Shunts: No atrial level shunt detected by color flow Doppler.  LEFT VENTRICLE PLAX 2D LVIDd:         4.30 cm      Diastology LVIDs:         2.90 cm      LV e' lateral:   15.20 cm/s LV PW:         1.20 cm      LV E/e' lateral: 6.3 LV IVS:        1.30 cm      LV e' medial:    9.46 cm/s LVOT diam:     2.10 cm      LV E/e' medial:  10.1 LV SV:         68 LV SV Index:   30 LVOT Area:     3.46 cm  LV Volumes (MOD) LV vol d, MOD A4C: 122.0 ml LV vol s, MOD A4C: 38.4 ml LV SV MOD A4C:     122.0 ml RIGHT VENTRICLE  IVC RV Basal diam:  4.20 cm  IVC diam: 2.30 cm RV Mid diam:    3.50 cm LEFT ATRIUM             Index       RIGHT ATRIUM           Index LA diam:        3.50 cm 1.58 cm/m  RA Area:     18.50 cm LA Vol (A2C):   56.5 ml 25.48 ml/m RA Volume:   50.70 ml  22.86 ml/m LA Vol (A4C):   40.3 ml 18.17 ml/m LA Biplane Vol: 48.7 ml 21.96 ml/m  AORTIC VALVE LVOT Vmax:   88.70 cm/s LVOT Vmean:  61.300 cm/s LVOT VTI:    0.195 m  AORTA Ao Root diam: 2.90 cm Ao Asc diam:  3.20 cm MITRAL VALVE MV Area (PHT): 4.31 cm    SHUNTS MV Decel Time: 176 msec    Systemic VTI:  0.20 m MV E velocity: 95.10 cm/s  Systemic Diam: 2.10 cm MV A velocity: 68.40 cm/s MV E/A ratio:  1.39 Rozann Lesches MD Electronically signed by Rozann Lesches MD Signature Date/Time: 07/20/2019/2:59:52 PM    Final    VAS [place] LOWER EXTREMITY VENOUS (DVT)  Result Date: 07/20/2019  Lower Venous DVTStudy Indications: Covid-19,, Swelling, and Pain.  Comparison Study: No prior LLE venous study on file Performing Technologist: Sharion Dove RVS  Examination Guidelines: A complete evaluation includes B-mode imaging, spectral Doppler, color Doppler, and power Doppler as needed of all accessible portions of each vessel. Bilateral testing is  considered an integral part of a complete examination. Limited examinations for reoccurring indications may be performed as noted. The reflux portion of the exam is performed with the patient in reverse Trendelenburg.  +-----+---------------+---------+-----------+----------+--------------+ RIGHTCompressibilityPhasicitySpontaneityPropertiesThrombus Aging +-----+---------------+---------+-----------+----------+--------------+ CFV  Full           Yes      Yes                                 +-----+---------------+---------+-----------+----------+--------------+   +---------+---------------+---------+-----------+----------+--------------+ LEFT     CompressibilityPhasicitySpontaneityPropertiesThrombus Aging +---------+---------------+---------+-----------+----------+--------------+ CFV      Full           Yes      Yes                                 +---------+---------------+---------+-----------+----------+--------------+ SFJ      Full                                                        +---------+---------------+---------+-----------+----------+--------------+ FV Prox  Partial        Yes      Yes                  Acute          +---------+---------------+---------+-----------+----------+--------------+ FV Mid   None                                         Acute          +---------+---------------+---------+-----------+----------+--------------+ FV DistalNone  Acute          +---------+---------------+---------+-----------+----------+--------------+ PFV      Full           Yes      Yes                  Acute          +---------+---------------+---------+-----------+----------+--------------+ POP      Partial        No       No                   Acute          +---------+---------------+---------+-----------+----------+--------------+ PTV      None                                         Acute           +---------+---------------+---------+-----------+----------+--------------+ PERO     None                                         Acute          +---------+---------------+---------+-----------+----------+--------------+     Summary: RIGHT: - No evidence of common femoral vein obstruction.  LEFT: - Findings consistent with acute deep vein thrombosis involving the left femoral vein, left popliteal vein, left posterior tibial veins, and left peroneal veins.  *See table(s) above for measurements and observations.    Preliminary         Scheduled Meds: . atropine  0.5 mg Intravenous Once  . Chlorhexidine Gluconate Cloth  6 each Topical Daily  . dexamethasone (DECADRON) injection  6 mg Intravenous Q24H  . mouth rinse  15 mL Mouth Rinse BID  . Melatonin  5 mg Oral QHS  . pantoprazole  40 mg Oral Daily  . sodium chloride flush  3 mL Intravenous Q12H   Continuous Infusions: . heparin       Time spent: 36 minutes with over 50% of the time coordinating the patient's care    Harold Hedge, DO [facility] Pager [phone]  Call night coverage person covering after 7pm

## 2019-07-21 NOTE — Progress Notes (Signed)
Pharmacy: Re- heparin  Patient's currently on heparin drip for acute PE and L DVT.  - last dose of Eliquis (10mg ) given on 3/20 at 2231.  Heparin drip started this morning at ~1037.  First heparin level now back elevated at 1.98  (goal 0.3-0.7) but this is likely from effect of Eiquis. aPTT is therapeutic at 70 (goal 66-102 sec). Will adjust heparin drip based on aPTT for now until heparin and aPTT levels correlate, then use heparin level alone for monitoring.  Plan: - continue heparin drip at 1500 units/hr - recheck another aPTT at 11p to confirm level is still therapeutic before changing to daily monitoring - monitor for s/s bleeding  Dia Sitter, PharmD, BCPS 07/21/2019 5:26 PM

## 2019-07-21 NOTE — Plan of Care (Addendum)
Patient received to room 19 via stretcher. Transferred to bed. Heparin gtt at 15 cc/hr infusing. Patient alert and oriented. Denies any c/o at present. Tele showing SB. Heparin gtt decreased to 13 cc/hr per pharmacy protocol. Tele showing SB with HR in 40's. Patient alert and oriented, denies any c/o at present.

## 2019-07-21 NOTE — Progress Notes (Signed)
PT was transferred to [facility] via [facility] transport team at aproximately 2045. PT left with all belongings and was transported via gurney. VSS at time of transport. Covid precautions taken, patient masked.

## 2019-07-22 DIAGNOSIS — I824Y2 Acute embolism and thrombosis of unspecified deep veins of left proximal lower extremity: Secondary | ICD-10-CM

## 2019-07-22 LAB — CBC
HCT: 42.1 % (ref 39.0–52.0)
Hemoglobin: 14 g/dL (ref 13.0–17.0)
MCH: 30.2 pg (ref 26.0–34.0)
MCHC: 33.3 g/dL (ref 30.0–36.0)
MCV: 90.9 fL (ref 80.0–100.0)
Platelets: 152 10*3/uL (ref 150–400)
RBC: 4.63 MIL/uL (ref 4.22–5.81)
RDW: 12.7 % (ref 11.5–15.5)
WBC: 6.8 10*3/uL (ref 4.0–10.5)
nRBC: 0 % (ref 0.0–0.2)

## 2019-07-22 LAB — HEPARIN LEVEL (UNFRACTIONATED)
Heparin Unfractionated: 1.24 IU/mL — ABNORMAL HIGH (ref 0.30–0.70)
Heparin Unfractionated: 1.09 IU/mL — ABNORMAL HIGH (ref 0.30–0.70)

## 2019-07-22 LAB — APTT
aPTT: 118 seconds — ABNORMAL HIGH (ref 24–36)
aPTT: 106 seconds — ABNORMAL HIGH (ref 24–36)
aPTT: 83 seconds — ABNORMAL HIGH (ref 24–36)
aPTT: 75 seconds — ABNORMAL HIGH (ref 24–36)

## 2019-07-22 MED ORDER — AMLODIPINE BESYLATE 5 MG PO TABS
5.0000 mg | ORAL_TABLET | Freq: Every day | ORAL | Status: DC
  Administered 2019-07-22 – 2019-07-23 (×2): 5 mg via ORAL
  Filled 2019-07-22 (×3): qty 1

## 2019-07-22 NOTE — TOC Benefit Eligibility Note (Signed)
Transition of Care [facility]) Benefit Eligibility Note    Patient Details  Name: [name] MRN: [id number] Date of Birth: 1965-12-01   Medication/Dose: Eliquis  Covered?: Yes     Prescription Coverage Preferred Pharmacy: [facility] with Person/Company/Phone Number:: [id number] Pharmacy  Co-Pay: $92.92 for 30 day  Prior Approval: No     Additional Notes: patient is eligible to use coupon for medications    Delorse Lek Phone Number: 07/22/2019, 12:55 PM

## 2019-07-22 NOTE — Consult Note (Signed)
Chief Complaint: Patient was seen in consultation today for consideration of left leg thrombectomy Chief Complaint  Patient presents with  . Leg Pain  . Fever  . Shortness of Breath   at the request of Dr Lodema Hong   Supervising Physician: Aletta Edouard  Patient Status: [name] - In-pt  History of Present Illness: [name] is a 54 y.o. male   Hx Psoriatic arthritis on Enbrel +Covid 07/10/19 Presented to ED 3/20 with worsening cough and SOB CTA Chest +PE with Rt heart strain Echo neg for strain Started on Hep drip Was initially then changed to Eliquis -- but now back on Heparin with + LLE Doppler for DVT + Remdesivir-- pt developed bradycardia-- asymptomatic  Pt was transferred to [facility] for consideration of Left leg thrombectomy in IR  CTV yesterday:IMPRESSION: 1. Normal CT venogram of the abdomen and pelvis. No evidence of deep venous thrombosis involving the IVC, bilateral iliac veins or bilateral common femoral veins. 2. Patchy peripheral opacities in the left lower lobe. 3. Stable pericardial cyst at the right cardiophrenic sulcus. 4. No acute findings in the abdomen or pelvis. per Dr [name]  I have seen and examined pt. LLE has little to no swelling Pt denies pain No redness He is able to walk on leg- stable  Request for consideration of LLE venous thrombectomy   Past Medical History:  Diagnosis Date  . Anemia   . History of avascular necrosis of capital femoral epiphysis 2006, 2007   Bilateral Femoral Head  . HLD (hyperlipidemia)   . Pneumonia    twice  . Psoriatic arthritis [facility])     Past Surgical History:  Procedure Laterality Date  . JOINT REPLACEMENT Bilateral 2006, 2007   Necrosis femoral head (bilateral)  . TOTAL HIP REVISION Right 11/23/2016   Procedure: Acetabulum liner and femoral head revision;  Surgeon: [name], MD;  Location: [facility] ORS;  Service: Orthopedics;  Laterality: Right;  . TOTAL HIP REVISION Left 04/10/2019   Procedure: Left hip bearing surface vs total hip arthroplasty revision;  Surgeon: [name], MD;  Location: [facility] ORS;  Service: Orthopedics;  Laterality: Left;  133min    Allergies: Gold-containing drug products  Medications: Prior to Admission medications   Medication Sig Start Date End Date Taking? Authorizing Provider  acetaminophen (TYLENOL) 500 MG tablet Take 1,000 mg by mouth every 8 (eight) hours as needed for mild pain or moderate pain.    Yes [provider]  cholecalciferol (VITAMIN D) 1000 units tablet Take 2,000 Units by mouth daily.    Yes [provider]  Coenzyme Q10 (COQ10) 100 MG CAPS Take 100 mg by mouth daily.    Yes [provider]  etanercept (ENBREL) 50 MG/ML injection Inject 0.98 mLs (50 mg total) into the skin once a week. Inject 1 syringe into the skin once a week. Patient taking differently: Inject 50 mg into the skin every Sunday. Inject 1 syringe into the skin once a week. 05/08/19  Yes Jegede, [name], MD  HYDROcodone-homatropine (HYCODAN) 5-1.5 MG/5ML syrup Take 5 mLs by mouth every 6 (six) hours as needed for up to 10 days. 07/17/19 07/27/19 Yes Burchette, [name], MD  levocetirizine (XYZAL) 5 MG tablet Take 5 mg by mouth daily.   Yes [provider]  Magnesium Oxide (MAG-200) 200 MG TABS Take 200 mg by mouth daily.   Yes [provider]  Omega-3 Fatty Acids (FISH OIL ULTRA) 1400 MG CAPS Take 1,400 mg by mouth daily.  Yes [provider]  omeprazole (PRILOSEC) 20 MG capsule Take 20 mg by mouth daily.   Yes [provider]  TURMERIC PO Take 15 mLs by mouth daily.    Yes [provider]  aspirin EC 325 MG EC tablet Take 1 tablet (325 mg total) by mouth 2 (two) times daily. Patient not taking: Reported on 06/11/2019 04/11/19   [name], PA-C  HYDROcodone-acetaminophen (NORCO/VICODIN) 5-325 MG tablet Take 1-2 tablets by mouth every 6 (six) hours as needed for severe pain. Patient not  taking: Reported on 06/11/2019 04/11/19   [name], PA-C  Melatonin 5 MG CAPS Take 5 mg by mouth at bedtime.     [provider]  methocarbamol (ROBAXIN) 500 MG tablet Take 1 tablet (500 mg total) by mouth every 6 (six) hours as needed for muscle spasms. Patient not taking: Reported on 06/11/2019 04/11/19   [name], PA-C  traMADol (ULTRAM) 50 MG tablet Take 1-2 tablets (50-100 mg total) by mouth every 6 (six) hours as needed for moderate pain. Patient not taking: Reported on 06/11/2019 04/11/19   [name], PA-C     Family History  Problem Relation Age of Onset  . Pneumonia Father   . Tremor Neg Hx     Social History   Socioeconomic History  . Marital status: Married    Spouse name: Not on file  . Number of children: 1  . Years of education: Not on file  . Highest education level: Not on file  Occupational History  . Occupation: Disabled  Tobacco Use  . Smoking status: Never Smoker  . Smokeless tobacco: Never Used  Substance and Sexual Activity  . Alcohol use: Yes    Alcohol/week: 0.0 standard drinks    Comment: 3-4 drinks per week  . Drug use: No  . Sexual activity: Not on file    Comment: Married  Other Topics Concern  . Not on file  Social History Narrative   Lives at home w/ his wife   Right-handed   Caffeine: 2-3 cups per day   Social Determinants of Health   Financial Resource Strain:   . Difficulty of Paying Living Expenses:   Food Insecurity:   . Worried About Charity fundraiser in the Last Year:   . Arboriculturist in the Last Year:   Transportation Needs:   . Film/video editor (Medical):   [name] Stephens of Transportation (Non-Medical):   Physical Activity:   . Days of Exercise per Week:   . Minutes of Exercise per Session:   Stress:   . Feeling of Stress :   Social Connections:   . Frequency of Communication with Friends and Family:   . Frequency of Social Gatherings with Friends and Family:   . Attends Religious Services:    . Active Member of Clubs or Organizations:   . Attends Archivist Meetings:   [name] Marital Status:     Review of Systems: A 12 point ROS discussed and pertinent positives are indicated in the HPI above.  All other systems are negative.  Review of Systems  Constitutional: Positive for activity change. Negative for appetite change, fatigue, fever and unexpected weight change.  Respiratory: Negative for cough, shortness of breath and wheezing.   Cardiovascular: Negative for chest pain and leg swelling.  Gastrointestinal: Negative for abdominal pain.  Musculoskeletal: Negative for back pain.  Psychiatric/Behavioral: Negative for behavioral problems and confusion.    Vital Signs: BP 136/75 (BP Location: Left Arm)  Pulse (!) 48   Temp 98 F (36.7 C) (Oral)   Resp 18   Ht 5\' 9"  (1.753 m)   Wt 244 lb 0.8 oz (110.7 kg)   SpO2 98%   BMI 36.04 kg/m   Physical Exam Vitals reviewed.  Pulmonary:     Effort: Pulmonary effort is normal.  Abdominal:     Palpations: Abdomen is soft.  Musculoskeletal:        General: Normal range of motion.     Left lower leg: No tenderness. Edema present.     Comments: Minimal swelling if any NT Pulses good at foot  Skin:    General: Skin is warm and dry.  Neurological:     Mental Status: He is alert and oriented to person, place, and time.  Psychiatric:        Behavior: Behavior normal.     Imaging: CT Angio Chest PE W and/or Wo Contrast  Result Date: 07/19/2019 CLINICAL DATA:  High probability for pulmonary embolism. Left calf pain. COVID-19 diagnosis 2 weeks ago. Dehydration. EXAM: CT ANGIOGRAPHY CHEST WITH CONTRAST TECHNIQUE: Multidetector CT imaging of the chest was performed using the standard protocol during bolus administration of intravenous contrast. Multiplanar CT image reconstructions and MIPs were obtained to evaluate the vascular anatomy. Automatic exposure control utilized. CONTRAST:  158mL OMNIPAQUE IOHEXOL 350 MG/ML SOLN  COMPARISON:  None. FINDINGS: Cardiovascular: Occlusive thrombus in the right upper lobar arteries. Additional bilateral more peripheral pulmonary emboli including nonocclusive thrombus in the right medial lower lobar pulmonary artery. Pulmonary artery hypertension, the pulmonary trunk measuring 3.8 cm diameter. Borderline cardiomegaly with mild coronary calcification. Right ventricular to left ventricular ratio 1.2, consistent with right heart strain. No pericardial fluid. Mediastinum/Nodes: Subcentimeter noncalcified benign-appearing mediastinal lymph nodes. Small hiatal hernia. A 6 x 2 x 2 cm 22 Hounsfield unit cyst is present in the right cardiophrenic angle or slightly increased in size from 4 x 2 x 2 cm on the July 30, 2011 study. 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 Trace bilateral pleural fluid. Central pulmonary vascular congestion without overt pulmonary edema. Subpleural atelectasis. Upper Abdomen: Benign-appearing water density right renal cortical cysts measuring 8 mm in the midpole and 35 mm in the upper pole. Mild fatty infiltration of the pancreas. Musculoskeletal: Moderate degenerative changes. Bridging anterior osteophytes in the lower thoracic spine. Review of the MIP images confirms the above findings. I discussed critical results by telephone at the time of interpretation on 07/19/2019 at 10:25 p.m. Eastern standard time with provider Isla Pence , who verbally acknowledged these results. IMPRESSION: Bilateral lobar and peripheral pulmonary arteries with right heart strain, the RV:LV ratio 1.2. Occlusive thrombus in the right upper lobar arteries. 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 Unenhanced CT chest recommended in 3 months. A benign pericardial recess cyst, slightly increased in size since 2013. No additional surveillance imaging is recommended. Borderline cardiomegaly with mild coronary  calcification and pulmonary artery hypertension. Trace bilateral pleural fluid. Electronically Signed   By: Revonda Humphrey   On: 07/19/2019 22:34   DG Chest Port 1 View  Result Date: 07/19/2019 CLINICAL DATA:  Shortness of breath, COVID positive EXAM: PORTABLE CHEST 1 VIEW COMPARISON:  2012 FINDINGS: Low lung volumes. No consolidation or edema. No pleural effusion or pneumothorax cardiomediastinal silhouette is likely within normal limits for portable technique. IMPRESSION: No acute process in the chest. Electronically Signed   By: Addison Lank.D.  On: 07/19/2019 21:05   ECHOCARDIOGRAM COMPLETE  Result Date: 07/20/2019    ECHOCARDIOGRAM REPORT   Patient Name:   [name] Date of Exam: 07/20/2019 Medical Rec #:  [id number]      Height:       69.0 in Accession #:    [id number]     Weight:       236.3 lb Date of Birth:  Sep 25, 1965       BSA:          2.218 m Patient Age:    38 years       BP:           141/82 mmHg Patient Gender: M              HR:           72 bpm. Exam Location:  Inpatient Procedure: 2D Echo, Cardiac Doppler and Color Doppler Indications:    I26.02 Pulmonary embolus  History:        Patient has no prior history of Echocardiogram examinations.                 CV19.  Sonographer:    Merrie Roof RDCS Referring Phys: [id number] [place]  1. Left ventricular ejection fraction, by estimation, is 60 to 65%. The left ventricle has normal function. The left ventricle has no regional wall motion abnormalities. There is mild left ventricular hypertrophy. Left ventricular diastolic parameters were normal.  2. Right ventricular systolic function is normal. The right ventricular size is normal. Tricuspid regurgitation signal is inadequate for assessing PA pressure.  3. The mitral valve is grossly normal. Trivial mitral valve regurgitation.  4. The aortic valve is tricuspid. Aortic valve regurgitation is not visualized.  5. The inferior vena cava is normal in size with greater than 50%  respiratory variability, suggesting right atrial pressure of 3 mmHg. FINDINGS  Left Ventricle: Left ventricular ejection fraction, by estimation, is 60 to 65%. The left ventricle has normal function. The left ventricle has no regional wall motion abnormalities. The left ventricular internal cavity size was normal in size. There is  mild left ventricular hypertrophy. Left ventricular diastolic parameters were normal. Right Ventricle: The right ventricular size is normal. No increase in right ventricular wall thickness. Right ventricular systolic function is normal. Tricuspid regurgitation signal is inadequate for assessing PA pressure. Left Atrium: Left atrial size was normal in size. Right Atrium: Right atrial size was normal in size. Pericardium: There is no evidence of pericardial effusion. Mitral Valve: The mitral valve is grossly normal. Trivial mitral valve regurgitation. Tricuspid Valve: The tricuspid valve is grossly normal. Tricuspid valve regurgitation is trivial. Aortic Valve: The aortic valve is tricuspid. Aortic valve regurgitation is not visualized. Mild aortic valve annular calcification. Pulmonic Valve: The pulmonic valve was grossly normal. Pulmonic valve regurgitation is not visualized. Aorta: The aortic root is normal in size and structure. Venous: The inferior vena cava is normal in size with greater than 50% respiratory variability, suggesting right atrial pressure of 3 mmHg. IAS/Shunts: No atrial level shunt detected by color flow Doppler.  LEFT VENTRICLE PLAX 2D LVIDd:         4.30 cm      Diastology LVIDs:         2.90 cm      LV e' lateral:   15.20 cm/s LV PW:         1.20 cm      LV E/e' lateral: 6.3 LV IVS:  1.30 cm      LV e' medial:    9.46 cm/s LVOT diam:     2.10 cm      LV E/e' medial:  10.1 LV SV:         68 LV SV Index:   30 LVOT Area:     3.46 cm  LV Volumes (MOD) LV vol d, MOD A4C: 122.0 ml LV vol s, MOD A4C: 38.4 ml LV SV MOD A4C:     122.0 ml RIGHT VENTRICLE          IVC  RV Basal diam:  4.20 cm  IVC diam: 2.30 cm RV Mid diam:    3.50 cm LEFT ATRIUM             Index       RIGHT ATRIUM           Index LA diam:        3.50 cm 1.58 cm/m  RA Area:     18.50 cm LA Vol (A2C):   56.5 ml 25.48 ml/m RA Volume:   50.70 ml  22.86 ml/m LA Vol (A4C):   40.3 ml 18.17 ml/m LA Biplane Vol: 48.7 ml 21.96 ml/m  AORTIC VALVE LVOT Vmax:   88.70 cm/s LVOT Vmean:  61.300 cm/s LVOT VTI:    0.195 m  AORTA Ao Root diam: 2.90 cm Ao Asc diam:  3.20 cm MITRAL VALVE MV Area (PHT): 4.31 cm    SHUNTS MV Decel Time: 176 msec    Systemic VTI:  0.20 m MV E velocity: 95.10 cm/s  Systemic Diam: 2.10 cm MV A velocity: 68.40 cm/s MV E/A ratio:  1.39 Rozann Lesches MD Electronically signed by Rozann Lesches MD Signature Date/Time: 07/20/2019/2:59:52 PM    Final    CT VENOGRAM ABD/PEL  Result Date: 07/22/2019 CLINICAL DATA:  Symptomatic DVT of the left lower extremity with duplex ultrasound demonstrating thrombus in the femoral vein, popliteal vein and calf veins. Assessment for additional potential iliac vein thrombus. EXAM: CT VENOGRAM ABD-PELVIS TECHNIQUE: Multidetector CT imaging of the abdomen and pelvis was performed using the standard protocol during bolus administration of intravenous contrast. Multiplanar reconstructed images and MIPs were obtained and reviewed to evaluate the vascular anatomy. Imaging was timed for venous evaluation of the abdomen and pelvis. CONTRAST:  123mL OMNIPAQUE IOHEXOL 350 MG/ML SOLN COMPARISON:  Results from a lower extremity venous duplex study on 07/20/2019 FINDINGS: VASCULAR Veins: The IVC and bilateral iliac veins are normally patent without evidence of thrombus, stenosis or anatomic variant. Common femoral veins are also normally patent bilaterally. No evidence of extrinsic compression of venous structures. Other venous structures including the renal veins, portal vein, splenic vein and mesenteric veins demonstrate normal patency. Review of the MIP images confirms the  above findings. NON-VASCULAR Lower chest: Patchy peripheral opacities in the left lower lobe. Stable pericardial cyst at the right cardiophrenic sulcus. Hepatobiliary: No focal liver abnormality is seen. No gallstones, gallbladder wall thickening, or biliary dilatation. Pancreas: Unremarkable. No pancreatic ductal dilatation or surrounding inflammatory changes. Spleen: Normal in size without focal abnormality. Adrenals/Urinary Tract: Adrenal glands are unremarkable. Kidneys are normal, without renal calculi, focal lesion, or hydronephrosis. Bladder is unremarkable. Stomach/Bowel: Visualized bowel demonstrates no evidence of obstruction, inflammation or lesion. No free air identified. Lymphatic: No enlarged lymph nodes identified in the abdomen or pelvis. Reproductive: Prostate is unremarkable. Other: No abdominal wall hernia or abnormality. No abdominopelvic ascites. Musculoskeletal: No acute or significant osseous findings. IMPRESSION: 1. Normal CT venogram of  the abdomen and pelvis. No evidence of deep venous thrombosis involving the IVC, bilateral iliac veins or bilateral common femoral veins. 2. Patchy peripheral opacities in the left lower lobe. 3. Stable pericardial cyst at the right cardiophrenic sulcus. 4. No acute findings in the abdomen or pelvis. Electronically Signed   By: Aletta Edouard M.D.   On: 07/22/2019 08:11   VAS [place] LOWER EXTREMITY VENOUS (DVT)  Result Date: 07/21/2019  Lower Venous DVTStudy Indications: Covid-19,, Swelling, and Pain.  Comparison Study: No prior LLE venous study on file Performing Technologist: Sharion Dove RVS  Examination Guidelines: A complete evaluation includes B-mode imaging, spectral Doppler, color Doppler, and power Doppler as needed of all accessible portions of each vessel. Bilateral testing is considered an integral part of a complete examination. Limited examinations for reoccurring indications may be performed as noted. The reflux portion of the exam is  performed with the patient in reverse Trendelenburg.  +-----+---------------+---------+-----------+----------+--------------+ RIGHTCompressibilityPhasicitySpontaneityPropertiesThrombus Aging +-----+---------------+---------+-----------+----------+--------------+ CFV  Full           Yes      Yes                                 +-----+---------------+---------+-----------+----------+--------------+   +---------+---------------+---------+-----------+----------+--------------+ LEFT     CompressibilityPhasicitySpontaneityPropertiesThrombus Aging +---------+---------------+---------+-----------+----------+--------------+ CFV      Full           Yes      Yes                                 +---------+---------------+---------+-----------+----------+--------------+ SFJ      Full                                                        +---------+---------------+---------+-----------+----------+--------------+ FV Prox  Partial        Yes      Yes                  Acute          +---------+---------------+---------+-----------+----------+--------------+ FV Mid   None                                         Acute          +---------+---------------+---------+-----------+----------+--------------+ FV DistalNone                                         Acute          +---------+---------------+---------+-----------+----------+--------------+ PFV      Full           Yes      Yes                  Acute          +---------+---------------+---------+-----------+----------+--------------+ POP      Partial        No       No                   Acute          +---------+---------------+---------+-----------+----------+--------------+  PTV      None                                         Acute          +---------+---------------+---------+-----------+----------+--------------+ PERO     None                                         Acute           +---------+---------------+---------+-----------+----------+--------------+     Summary: RIGHT: - No evidence of common femoral vein obstruction.  LEFT: - Findings consistent with acute deep vein thrombosis involving the left femoral vein, left popliteal vein, left posterior tibial veins, and left peroneal veins.  *See table(s) above for measurements and observations. Electronically signed by Ruta Hinds MD on 07/21/2019 at 12:32:04 PM.    Final     Labs:  CBC: Recent Labs    07/19/19 2035 07/20/19 0817 07/21/19 0233 07/22/19 0708  WBC 8.4 5.1 8.9 6.8  HGB 15.9 16.1 13.5 14.0  HCT 47.6 48.3 40.7 42.1  PLT 143* 160 147* 152    COAGS: Recent Labs    04/09/19 1024 04/09/19 1024 07/20/19 0817 07/21/19 1634 07/21/19 2321 07/22/19 0708  INR 0.9  --  1.1  --   --   --   APTT 29   < > 154* 70* 118* 106*   < > = values in this interval not displayed.    BMP: Recent Labs    04/11/19 0338 05/28/19 1043 07/19/19 2035 07/21/19 0233  NA 138 139 137 140  K 4.3 4.4 3.9 4.5  CL 106 105 104 108  CO2 24 27 23 23   GLUCOSE 135* 85 99 129*  BUN 18 20 19  26*  CALCIUM 8.9 9.2 8.8* 8.8*  CREATININE 0.96 0.91 0.98 0.93  GFRNONAA >60 95 >60 >60  GFRAA >60 110 >60 >60    LIVER FUNCTION TESTS: Recent Labs    04/09/19 1024 05/28/19 1043 07/19/19 2035 07/21/19 0233  BILITOT 1.1 0.6 1.0 0.7  AST 25 19 22 19   ALT 35 23 32 27  ALKPHOS 73  --  75 60  PROT 7.5 6.9 7.6 6.6  ALBUMIN 4.3  --  4.0 3.4*    TUMOR MARKERS: No results for input(s): AFPTM, CEA, CA199, CHROMGRNA in the last 8760 hours.  Assessment and Plan:  Covid + 07/10/19 remdesivir - bradycardia-- asymptomatic +PE with strain per CT Echo- shows NO heart strain CTV: Normal CT venogram of the abdomen and pelvis. No evidence of deep venous thrombosis involving the IVC, bilateral iliac veins or bilateral common femoral veins Pt denies pain ; denies leg swelling Breathing much better  With CTV showing NO DVT of  IVC; B Iliac or femoral veins- Pt feeling better daily; minimal swelling; no pain NOT a candidate for LLE venous thrombolysis/thrombectomy at this tine per Dr [name] Recommendation: Continue Heparin drip -- plan per [facility] Pt is aware of this recommendation    Thank you for this interesting consult.  I greatly enjoyed meeting PRATHER LINENBERGER and look forward to participating in their care.  A copy of this report was sent to the requesting provider on this date.  Electronically Signed: Lavonia Drafts, PA-C 07/22/2019, 10:00 AM   I spent a total of  68 Minutes    in face to face in clinical consultation, greater than 50% of which was counseling/coordinating care for LLE DVT and consideration of LLE venous thrombectomy

## 2019-07-22 NOTE — Consult Note (Signed)
   [facility] Inpatient Consult   07/22/2019  [name] 1965-06-27 [id number]    Patient is currently in pending status with [facility] Care Management for chronic disease management services.  Patient has been assigned to a [place] for the [place].  Patient will receive a post hospital call and will be evaluated for assessments and disease process education as a benefit in the plan for support and resource.      Plan: Patient will be followed by [facility] Coordinator.   For additional questions or referrals please contact:   Natividad Brood, RN BSN [facility] Liaison  [phone] business mobile phone Toll free office [phone]  Fax number: [phone] [email or web address]

## 2019-07-22 NOTE — Telephone Encounter (Signed)
Pt was admitted over the weekend for increased dyspnea and PE.

## 2019-07-22 NOTE — Care Management (Signed)
Eliquis benefit check submitted

## 2019-07-22 NOTE — Progress Notes (Signed)
PROGRESS NOTE                                                                                                                                                                                                             Patient Demographics:    [name], is a 54 y.o. male, DOB - [birth year], [id number]  Outpatient Primary MD for the patient is Eulas Post, MD   Admit date - 07/19/2019   LOS - 3  Chief Complaint  Patient presents with  . Leg Pain  . Fever  . Shortness of Breath       Brief Narrative: Patient is a 54 y.o. male with PMHx of psoriatic arthritis on Enbrel-diagnosed with COVID-19 on 3/10 (but was having symptoms for almost a week before getting tested)-admitted to [facility] on 3/19 for shortness of breath-found to have large PE and left lower extremity DVT.  Transferred to [facility] for IR evaluation for possible catheter guided lytic therapy.  Significant Events: 3/19>> admit to [facility] for large PE with CT evidence of RV strain and LLE DVT 3/20>> TTE-normal RV systolic function.  EF 60-65% 3/21>> transferred to [facility] for IR evaluation for catheter guided lytic therapy  COVID-19 medications: Remdesivir: 3/19 x 1 Decadron: 3/19>>  DVT prophylaxis: IV heparin infusion  Microbiology data: 3/19: Blood culture>> negative  Procedures: None  Consults: IR   Subjective:    [name] today was lying comfortably in bed-did not have pleuritic chest pain when I asked him to take a deep breath.  No shortness of breath at rest.   Assessment  & Plan :   Large PE/left lower extremity DVT: Likely provoked by COVID-19-echo without any RV strain.  Hemodynamically stable.  Continue IV heparin.  Evaluated by IR-not a candidate for catheter guided lytic therapy.  If remains stable-we will transition back to Eliquis in the next day or so.  COVID-19 infection: Shortness of breath is from PE-really no major parenchymal  lesion seen on CT chest-however CRP is elevated.  Continue to watch off remdesivir-continue steroids.  OVID-19 Labs: Recent Labs    07/19/19 2035 07/19/19 2120 07/21/19 0233  DDIMER >20.00*  --  7.74*  FERRITIN  --  95 145  LDH 177  --   --   CRP  --  5.2* 6.4*  Component Value Date/Time   BNP 20.1 07/19/2019 2140    Recent Labs  Lab 07/19/19 2035  PROCALCITON <0.05    Lab Results  Component Value Date   SARSCOV2NAA Detected (A) 07/10/2019   SARSCOV2NAA NOT DETECTED 04/06/2019   [place] Not Detected 03/15/2019     Sinus bradycardia: Asymptomatic-could have underlying OSA.  TSH last year was within normal limits-repeat TSH with tomorrow am labs.  Avoid rate controlling agents.  Continue telemetry monitoring.  HTN: Blood pressure persistently on the higher side-start low-dose amlodipine and optimize.  History of psoriatic arthritis: Enbrel on hold-Currently on steroids for Covid 19 infection.  [place] hump deformity and lingula/left base/right lobe: Radiology recommends unenhanced CT chest in 3 months.  Obesity: Estimated body mass index is 36.04 kg/m as calculated from the following:   Height as of this encounter: 5\' 9"  (1.753 m).   Weight as of this encounter: 110.7 kg.    ABG: No results found for: PHART, PCO2ART, PO2ART, HCO3, TCO2, ACIDBASEDEF, O2SAT  Vent Settings: N/A  Condition - Guarded  Family Communication  :  Spouse updated over the phone 3/22  Code Status :  Full Code  Diet :  Diet Order            Diet NPO time specified  Diet effective midnight               Disposition Plan  : Home  Barriers to discharge: Hypoxia requiring O2 supplementation/IV heparin  Antimicorbials  :    Anti-infectives (From admission, onward)   Start     Dose/Rate Route Frequency Ordered Stop   07/21/19 1000  remdesivir 100 mg in sodium chloride 0.9 % 100 mL IVPB  Status:  Discontinued     100 mg 200 mL/hr over 30 Minutes Intravenous Daily  07/20/19 0509 07/20/19 0556   07/21/19 1000  remdesivir 100 mg in sodium chloride 0.9 % 100 mL IVPB  Status:  Discontinued     100 mg 200 mL/hr over 30 Minutes Intravenous Daily 07/20/19 0158 07/21/19 0858   07/20/19 0509  remdesivir 200 mg in sodium chloride 0.9% 250 mL IVPB  Status:  Discontinued     200 mg 580 mL/hr over 30 Minutes Intravenous Once 07/20/19 0509 07/20/19 0556   07/20/19 0200  remdesivir 100 mg in sodium chloride 0.9 % 100 mL IVPB     100 mg 200 mL/hr over 30 Minutes Intravenous Every 1 hr x 2 07/20/19 0157 07/20/19 0559      Inpatient Medications  Scheduled Meds: . atropine  0.5 mg Intravenous Once  . Chlorhexidine Gluconate Cloth  6 each Topical Daily  . dexamethasone (DECADRON) injection  6 mg Intravenous Q24H  . mouth rinse  15 mL Mouth Rinse BID  . Melatonin  6 mg Oral QHS  . pantoprazole  40 mg Oral Daily  . sodium chloride flush  3 mL Intravenous Q12H   Continuous Infusions: . heparin 1,200 Units/hr (07/22/19 0902)   PRN Meds:.acetaminophen, chlorpheniramine-HYDROcodone, guaiFENesin-dextromethorphan, hydrALAZINE, HYDROcodone-acetaminophen, ondansetron **OR** ondansetron (ZOFRAN) IV, polyethylene glycol   Time Spent in minutes  25  See all Orders from today for further details   Oren Binet M.D on 07/22/2019 at 10:46 AM  To page go to [email or web address] - use universal password  [facility] -  Office  [phone]    Objective:   Vitals:   07/21/19 2003 07/21/19 2148 07/22/19 0344 07/22/19 0810  BP:  (!) 150/76 121/68 136/75  Pulse:  (!) 56  (!) 48  Resp:  18  18  Temp: 98.1 F (36.7 C) (!) 97.5 F (36.4 C) 98.1 F (36.7 C) 98 F (36.7 C)  TempSrc: Oral Oral Oral Oral  SpO2:  96%  98%  Weight:      Height:        Wt Readings from Last 3 Encounters:  07/20/19 110.7 kg  06/11/19 114.9 kg  04/10/19 111.6 kg     Intake/Output Summary (Last 24 hours) at 07/22/2019 1046 Last data filed at 07/22/2019 0500 Gross per 24 hour    Intake 268.62 ml  Output 950 ml  Net -681.38 ml     Physical Exam Gen Exam:Alert awake-not in any distress HEENT:atraumatic, normocephalic Chest: B/L clear to auscultation anteriorly CVS:S1S2 regular Abdomen:soft non tender, non distended Extremities:no edema-has some mild swelling of his left leg when compared to the right. Neurology: Non focal Skin: no rash   Data Review:    CBC Recent Labs  Lab 07/19/19 2035 07/20/19 0817 07/21/19 0233 07/22/19 0708  WBC 8.4 5.1 8.9 6.8  HGB 15.9 16.1 13.5 14.0  HCT 47.6 48.3 40.7 42.1  PLT 143* 160 147* 152  MCV 93.2 92.4 91.9 90.9  MCH 31.1 30.8 30.5 30.2  MCHC 33.4 33.3 33.2 33.3  RDW 12.5 12.3 12.5 12.7  LYMPHSABS 1.6  --   --   --   MONOABS 0.4  --   --   --   EOSABS 0.1  --   --   --   BASOSABS 0.0  --   --   --     Chemistries  Recent Labs  Lab 07/19/19 2035 07/21/19 0233  NA 137 140  K 3.9 4.5  CL 104 108  CO2 23 23  GLUCOSE 99 129*  BUN 19 26*  CREATININE 0.98 0.93  CALCIUM 8.8* 8.8*  MG  --  2.0  AST 22 19  ALT 32 27  ALKPHOS 75 60  BILITOT 1.0 0.7   ------------------------------------------------------------------------------------------------------------------ Recent Labs    07/19/19 2035  TRIG 132    Lab Results  Component Value Date   HGBA1C 5.1 11/16/2016   ------------------------------------------------------------------------------------------------------------------ No results for input(s): TSH, T4TOTAL, T3FREE, THYROIDAB in the last 72 hours.  Invalid input(s): FREET3 ------------------------------------------------------------------------------------------------------------------ Recent Labs    07/19/19 2120 07/21/19 0233  FERRITIN 95 145    Coagulation profile Recent Labs  Lab 07/20/19 0817  INR 1.1    Recent Labs    07/19/19 2035 07/21/19 0233  DDIMER >20.00* 7.74*    Cardiac Enzymes No results for input(s): CKMB, TROPONINI, MYOGLOBIN in the last 168  hours.  Invalid input(s): CK ------------------------------------------------------------------------------------------------------------------    Component Value Date/Time   BNP 20.1 07/19/2019 2140    Micro Results Recent Results (from the past 240 hour(s))  Blood Culture (routine x 2)     Status: None (Preliminary result)   Collection Time: 07/19/19  8:22 PM   Specimen: BLOOD  Result Value Ref Range Status   Specimen Description   Final    BLOOD RIGHT ANTECUBITAL Performed at [facility], [place] [address], [place], [place] [address]    Special Requests   Final    BOTTLES DRAWN AEROBIC AND ANAEROBIC Blood Culture results may not be optimal due to an excessive volume of blood received in culture bottles Performed at [facility] [address], [place],  [address]    Culture   Final    NO GROWTH 2 DAYS Performed at [facility] [address]  [place], [place] [address]    Report Status PENDING  Incomplete  Blood Culture (routine x 2)     Status: None (Preliminary result)   Collection Time: 07/19/19  8:27 PM   Specimen: BLOOD  Result Value Ref Range Status   Specimen Description   Final    BLOOD RIGHT HAND Performed at [facility] [address] [place], [place] [address]    Special Requests   Final    BOTTLES DRAWN AEROBIC AND ANAEROBIC Blood Culture results may not be optimal due to an excessive volume of blood received in culture bottles Performed at [facility] [address] [place], [place] [address]    Culture   Final    NO GROWTH 1 DAY Performed at [facility], [place] [address], [place], [place] [address]    Report Status PENDING  Incomplete  MRSA PCR Screening     Status: None   Collection Time: 07/20/19  8:48 PM   Specimen: Nasopharyngeal  Result Value Ref Range Status   MRSA by PCR NEGATIVE NEGATIVE Final    Comment:        The GeneXpert MRSA Assay (FDA approved for  NASAL specimens only), is one component of a comprehensive MRSA colonization surveillance program. It is not intended to diagnose MRSA infection nor to guide or monitor treatment for MRSA infections. Performed at [facility], [place] [address] [place], [place] [address]     Radiology Reports CT Angio Chest PE W and/or Wo Contrast  Result Date: 07/19/2019 CLINICAL DATA:  High probability for pulmonary embolism. Left calf pain. COVID-19 diagnosis 2 weeks ago. Dehydration. EXAM: CT ANGIOGRAPHY CHEST WITH CONTRAST TECHNIQUE: Multidetector CT imaging of the chest was performed using the standard protocol during bolus administration of intravenous contrast. Multiplanar CT image reconstructions and MIPs were obtained to evaluate the vascular anatomy. Automatic exposure control utilized. CONTRAST:  155mL OMNIPAQUE IOHEXOL 350 MG/ML SOLN COMPARISON:  None. FINDINGS: Cardiovascular: Occlusive thrombus in the right upper lobar arteries. Additional bilateral more peripheral pulmonary emboli including nonocclusive thrombus in the right medial lower lobar pulmonary artery. Pulmonary artery hypertension, the pulmonary trunk measuring 3.8 cm diameter. Borderline cardiomegaly with mild coronary calcification. Right ventricular to left ventricular ratio 1.2, consistent with right heart strain. No pericardial fluid. Mediastinum/Nodes: Subcentimeter noncalcified benign-appearing mediastinal lymph nodes. Small hiatal hernia. A 6 x 2 x 2 cm 22 Hounsfield unit cyst is present in the right cardiophrenic angle or slightly increased in size from 4 x 2 x 2 cm on the July 30, 2011 study. 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 Trace bilateral pleural fluid. Central pulmonary vascular congestion without overt pulmonary edema. Subpleural atelectasis. Upper Abdomen: Benign-appearing water density right renal cortical cysts measuring 8 mm in the midpole and 35 mm  in the upper pole. Mild fatty infiltration of the pancreas. Musculoskeletal: Moderate degenerative changes. Bridging anterior osteophytes in the lower thoracic spine. Review of the MIP images confirms the above findings. I discussed critical results by telephone at the time of interpretation on 07/19/2019 at 10:25 p.m. Eastern standard time with provider Isla Pence , who verbally acknowledged these results. IMPRESSION: Bilateral lobar and peripheral pulmonary arteries with right heart strain, the RV:LV ratio 1.2. Occlusive thrombus in the right upper lobar arteries. 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 Unenhanced CT chest recommended in 3 months. A benign pericardial recess cyst, slightly increased in size  since 2013. No additional surveillance imaging is recommended. Borderline cardiomegaly with mild coronary calcification and pulmonary artery hypertension. Trace bilateral pleural fluid. Electronically Signed   By: Revonda Humphrey   On: 07/19/2019 22:34   DG Chest Port 1 View  Result Date: 07/19/2019 CLINICAL DATA:  Shortness of breath, COVID positive EXAM: PORTABLE CHEST 1 VIEW COMPARISON:  2012 FINDINGS: Low lung volumes. No consolidation or edema. No pleural effusion or pneumothorax cardiomediastinal silhouette is likely within normal limits for portable technique. IMPRESSION: No acute process in the chest. Electronically Signed   By: Macy Mis M.D.   On: 07/19/2019 21:05   ECHOCARDIOGRAM COMPLETE  Result Date: 07/20/2019    ECHOCARDIOGRAM REPORT   Patient Name:   KAVYN RAMKISSOON Date of Exam: 07/20/2019 Medical Rec #:  [id number]      Height:       69.0 in Accession #:    [id number]     Weight:       236.3 lb Date of Birth:  1966/04/19       BSA:          2.218 m Patient Age:    59 years       BP:           141/82 mmHg Patient Gender: M              HR:           72 bpm. Exam Location:  Inpatient Procedure: 2D Echo, Cardiac Doppler and Color Doppler  Indications:    I26.02 Pulmonary embolus  History:        Patient has no prior history of Echocardiogram examinations.                 CV19.  Sonographer:    Merrie Roof RDCS Referring Phys: [id number] [place]  1. Left ventricular ejection fraction, by estimation, is 60 to 65%. The left ventricle has normal function. The left ventricle has no regional wall motion abnormalities. There is mild left ventricular hypertrophy. Left ventricular diastolic parameters were normal.  2. Right ventricular systolic function is normal. The right ventricular size is normal. Tricuspid regurgitation signal is inadequate for assessing PA pressure.  3. The mitral valve is grossly normal. Trivial mitral valve regurgitation.  4. The aortic valve is tricuspid. Aortic valve regurgitation is not visualized.  5. The inferior vena cava is normal in size with greater than 50% respiratory variability, suggesting right atrial pressure of 3 mmHg. FINDINGS  Left Ventricle: Left ventricular ejection fraction, by estimation, is 60 to 65%. The left ventricle has normal function. The left ventricle has no regional wall motion abnormalities. The left ventricular internal cavity size was normal in size. There is  mild left ventricular hypertrophy. Left ventricular diastolic parameters were normal. Right Ventricle: The right ventricular size is normal. No increase in right ventricular wall thickness. Right ventricular systolic function is normal. Tricuspid regurgitation signal is inadequate for assessing PA pressure. Left Atrium: Left atrial size was normal in size. Right Atrium: Right atrial size was normal in size. Pericardium: There is no evidence of pericardial effusion. Mitral Valve: The mitral valve is grossly normal. Trivial mitral valve regurgitation. Tricuspid Valve: The tricuspid valve is grossly normal. Tricuspid valve regurgitation is trivial. Aortic Valve: The aortic valve is tricuspid. Aortic valve regurgitation is not  visualized. Mild aortic valve annular calcification. Pulmonic Valve: The pulmonic valve was grossly normal. Pulmonic valve regurgitation is not visualized. Aorta: The aortic root is normal in  size and structure. Venous: The inferior vena cava is normal in size with greater than 50% respiratory variability, suggesting right atrial pressure of 3 mmHg. IAS/Shunts: No atrial level shunt detected by color flow Doppler.  LEFT VENTRICLE PLAX 2D LVIDd:         4.30 cm      Diastology LVIDs:         2.90 cm      LV e' lateral:   15.20 cm/s LV PW:         1.20 cm      LV E/e' lateral: 6.3 LV IVS:        1.30 cm      LV e' medial:    9.46 cm/s LVOT diam:     2.10 cm      LV E/e' medial:  10.1 LV SV:         68 LV SV Index:   30 LVOT Area:     3.46 cm  LV Volumes (MOD) LV vol d, MOD A4C: 122.0 ml LV vol s, MOD A4C: 38.4 ml LV SV MOD A4C:     122.0 ml RIGHT VENTRICLE          IVC RV Basal diam:  4.20 cm  IVC diam: 2.30 cm RV Mid diam:    3.50 cm LEFT ATRIUM             Index       RIGHT ATRIUM           Index LA diam:        3.50 cm 1.58 cm/m  RA Area:     18.50 cm LA Vol (A2C):   56.5 ml 25.48 ml/m RA Volume:   50.70 ml  22.86 ml/m LA Vol (A4C):   40.3 ml 18.17 ml/m LA Biplane Vol: 48.7 ml 21.96 ml/m  AORTIC VALVE LVOT Vmax:   88.70 cm/s LVOT Vmean:  61.300 cm/s LVOT VTI:    0.195 m  AORTA Ao Root diam: 2.90 cm Ao Asc diam:  3.20 cm MITRAL VALVE MV Area (PHT): 4.31 cm    SHUNTS MV Decel Time: 176 msec    Systemic VTI:  0.20 m MV E velocity: 95.10 cm/s  Systemic Diam: 2.10 cm MV A velocity: 68.40 cm/s MV E/A ratio:  1.39 Rozann Lesches MD Electronically signed by Rozann Lesches MD Signature Date/Time: 07/20/2019/2:59:52 PM    Final    CT VENOGRAM ABD/PEL  Result Date: 07/22/2019 CLINICAL DATA:  Symptomatic DVT of the left lower extremity with duplex ultrasound demonstrating thrombus in the femoral vein, popliteal vein and calf veins. Assessment for additional potential iliac vein thrombus. EXAM: CT VENOGRAM  ABD-PELVIS TECHNIQUE: Multidetector CT imaging of the abdomen and pelvis was performed using the standard protocol during bolus administration of intravenous contrast. Multiplanar reconstructed images and MIPs were obtained and reviewed to evaluate the vascular anatomy. Imaging was timed for venous evaluation of the abdomen and pelvis. CONTRAST:  166mL OMNIPAQUE IOHEXOL 350 MG/ML SOLN COMPARISON:  Results from a lower extremity venous duplex study on 07/20/2019 FINDINGS: VASCULAR Veins: The IVC and bilateral iliac veins are normally patent without evidence of thrombus, stenosis or anatomic variant. Common femoral veins are also normally patent bilaterally. No evidence of extrinsic compression of venous structures. Other venous structures including the renal veins, portal vein, splenic vein and mesenteric veins demonstrate normal patency. Review of the MIP images confirms the above findings. NON-VASCULAR Lower chest: Patchy peripheral opacities in the left lower lobe. Stable pericardial cyst  at the right cardiophrenic sulcus. Hepatobiliary: No focal liver abnormality is seen. No gallstones, gallbladder wall thickening, or biliary dilatation. Pancreas: Unremarkable. No pancreatic ductal dilatation or surrounding inflammatory changes. Spleen: Normal in size without focal abnormality. Adrenals/Urinary Tract: Adrenal glands are unremarkable. Kidneys are normal, without renal calculi, focal lesion, or hydronephrosis. Bladder is unremarkable. Stomach/Bowel: Visualized bowel demonstrates no evidence of obstruction, inflammation or lesion. No free air identified. Lymphatic: No enlarged lymph nodes identified in the abdomen or pelvis. Reproductive: Prostate is unremarkable. Other: No abdominal wall hernia or abnormality. No abdominopelvic ascites. Musculoskeletal: No acute or significant osseous findings. IMPRESSION: 1. Normal CT venogram of the abdomen and pelvis. No evidence of deep venous thrombosis involving the IVC,  bilateral iliac veins or bilateral common femoral veins. 2. Patchy peripheral opacities in the left lower lobe. 3. Stable pericardial cyst at the right cardiophrenic sulcus. 4. No acute findings in the abdomen or pelvis. Electronically Signed   By: Aletta Edouard M.D.   On: 07/22/2019 08:11   VAS [place] LOWER EXTREMITY VENOUS (DVT)  Result Date: 07/21/2019  Lower Venous DVTStudy Indications: Covid-19,, Swelling, and Pain.  Comparison Study: No prior LLE venous study on file Performing Technologist: Sharion Dove RVS  Examination Guidelines: A complete evaluation includes B-mode imaging, spectral Doppler, color Doppler, and power Doppler as needed of all accessible portions of each vessel. Bilateral testing is considered an integral part of a complete examination. Limited examinations for reoccurring indications may be performed as noted. The reflux portion of the exam is performed with the patient in reverse Trendelenburg.  +-----+---------------+---------+-----------+----------+--------------+ RIGHTCompressibilityPhasicitySpontaneityPropertiesThrombus Aging +-----+---------------+---------+-----------+----------+--------------+ CFV  Full           Yes      Yes                                 +-----+---------------+---------+-----------+----------+--------------+   +---------+---------------+---------+-----------+----------+--------------+ LEFT     CompressibilityPhasicitySpontaneityPropertiesThrombus Aging +---------+---------------+---------+-----------+----------+--------------+ CFV      Full           Yes      Yes                                 +---------+---------------+---------+-----------+----------+--------------+ SFJ      Full                                                        +---------+---------------+---------+-----------+----------+--------------+ FV Prox  Partial        Yes      Yes                  Acute           +---------+---------------+---------+-----------+----------+--------------+ FV Mid   None                                         Acute          +---------+---------------+---------+-----------+----------+--------------+ FV DistalNone  Acute          +---------+---------------+---------+-----------+----------+--------------+ PFV      Full           Yes      Yes                  Acute          +---------+---------------+---------+-----------+----------+--------------+ POP      Partial        No       No                   Acute          +---------+---------------+---------+-----------+----------+--------------+ PTV      None                                         Acute          +---------+---------------+---------+-----------+----------+--------------+ PERO     None                                         Acute          +---------+---------------+---------+-----------+----------+--------------+     Summary: RIGHT: - No evidence of common femoral vein obstruction.  LEFT: - Findings consistent with acute deep vein thrombosis involving the left femoral vein, left popliteal vein, left posterior tibial veins, and left peroneal veins.  *See table(s) above for measurements and observations. Electronically signed by Ruta Hinds MD on 07/21/2019 at 12:32:04 PM.    Final

## 2019-07-22 NOTE — Progress Notes (Signed)
[facility] for heparin Indication: pulmonary embolus  Allergies  Allergen Reactions  . Gold-Containing Drug Products Rash    Denies oral or airway involvement - occurred in the 1980s.     Patient Measurements: Height: 5\' 9"  (175.3 cm) Weight: 244 lb 0.8 oz (110.7 kg) IBW/kg (Calculated) : 70.7 Heparin Dosing Weight: 94kg  Vital Signs: Temp: 97.6 F (36.4 C) (03/22 1158) Temp Source: Oral (03/22 1158) BP: 143/69 (03/22 1158) Pulse Rate: 48 (03/22 0810)  Labs: Recent Labs    07/19/19 2035 07/19/19 2035 07/19/19 2222 07/20/19 0817 07/20/19 0817 07/20/19 1949 07/20/19 2153 07/21/19 0233 07/21/19 1634 07/21/19 1634 07/21/19 2321 07/22/19 0708 07/22/19 1522  HGB 15.9   < >  --  16.1   < >  --   --  13.5  --   --   --  14.0  --   HCT 47.6   < >  --  48.3  --   --   --  40.7  --   --   --  42.1  --   PLT 143*   < >  --  160  --   --   --  147*  --   --   --  152  --   APTT  --   --   --  154*   < >  --   --   --  70*   < > 118* 106* 83*  LABPROT  --   --   --  13.6  --   --   --   --   --   --   --   --   --   INR  --   --   --  1.1  --   --   --   --   --   --   --   --   --   HEPARINUNFRC  --   --   --  0.75*   < >  --   --   --  1.98*  --   --  1.24* 1.09*  CREATININE 0.98  --   --   --   --   --   --  0.93  --   --   --   --   --   TROPONINIHS 10   < > 15  --   --  4 5  --   --   --   --   --   --    < > = values in this interval not displayed.    Estimated Creatinine Clearance: 110.1 mL/min (by C-G formula based on SCr of 0.93 mg/dL).   Medical History: Past Medical History:  Diagnosis Date  . Anemia   . History of avascular necrosis of capital femoral epiphysis 2006, 2007   Bilateral Femoral Head  . HLD (hyperlipidemia)   . Pneumonia    twice  . Psoriatic arthritis [facility])     Assessment: Patient's a 54 y.o M who was diagnosed with COVID one week prior to adm (07/10/19), presented to the ED on 3/19 with c/o SOB, cough,  fatigue and left leg swelling. D-dimer on admission was elevated and chest CTA showed bilateral PE with right heart strain.  He was started on heparin for acute PE on 3/19.  APTT came back therapeutic at 83, heparin level is supratherapeutic at 1.09- on heparin 1200 units/hr. Hgb 14, plt 152. No infusion issues.  Dressing at IV site bleeding - will continue to monitor.  Goal of Therapy:  Heparin level 0.3-0.7 units/ml  APTT 66-102 seconds Monitor platelets by anticoagulation protocol   Plan:  Continue heparin drip at 1200 units/hr APTT in 6 hours Daily CBC, s/sx of bleeding  F/u when to resume eliquis  Antonietta Jewel, PharmD, [place] Pharmacist  Phone: [phone]  Please check AMION for all [place] phone numbers After 10:00 PM, call [place] [phone] 07/22/2019, 4:12 PM

## 2019-07-22 NOTE — Progress Notes (Signed)
ANTICOAGULATION CONSULT NOTE - Initial Consult  Pharmacy Consult for heparin Indication: pulmonary embolus  Allergies  Allergen Reactions  . Gold-Containing Drug Products Rash    Denies oral or airway involvement - occurred in the 1980s.     Patient Measurements: Height: 5\' 9"  (175.3 cm) Weight: 244 lb 0.8 oz (110.7 kg) IBW/kg (Calculated) : 70.7 Heparin Dosing Weight: 94kg  Vital Signs: Temp: 98 F (36.7 C) (03/22 0810) Temp Source: Oral (03/22 0810) BP: 136/75 (03/22 0810) Pulse Rate: 48 (03/22 0810)  Labs: Recent Labs    07/19/19 2035 07/19/19 2035 07/19/19 2222 07/20/19 [id number] 07/20/19 0817 07/20/19 1949 07/20/19 2153 07/21/19 0233 07/21/19 1634 07/21/19 2321 07/22/19 0708  HGB 15.9   < >  --  16.1   < >  --   --  13.5  --   --  14.0  HCT 47.6   < >  --  48.3  --   --   --  40.7  --   --  42.1  PLT 143*   < >  --  160  --   --   --  147*  --   --  152  APTT  --   --   --  154*   < >  --   --   --  70* 118* 106*  LABPROT  --   --   --  13.6  --   --   --   --   --   --   --   INR  --   --   --  1.1  --   --   --   --   --   --   --   HEPARINUNFRC  --   --   --  0.75*  --   --   --   --  1.98*  --   --   CREATININE 0.98  --   --   --   --   --   --  0.93  --   --   --   TROPONINIHS 10   < > 15  --   --  4 5  --   --   --   --    < > = values in this interval not displayed.    Estimated Creatinine Clearance: 110.1 mL/min (by C-G formula based on SCr of 0.93 mg/dL).   Medical History: Past Medical History:  Diagnosis Date  . Anemia   . History of avascular necrosis of capital femoral epiphysis 2006, 2007   Bilateral Femoral Head  . HLD (hyperlipidemia)   . Pneumonia    twice  . Psoriatic arthritis [facility])       Assessment: Patient's a 54 y.o M who was diagnosed with COVID one week prior to adm (07/10/19), presented to the ED on 3/19 with c/o SOB, cough, fatigue and left leg swelling. D-dimer on admission was elevated and chest CTA showed bilateral PE  with right heart strain.  He was started on heparin for acute PE on 3/19.  Significant Events: 3/20 heparin stopped and apixaban started, pt received 2 doses, LD 2234 3/21 pharmacy consulted to resume heparin incase pt needs cards procedure  APTT 106 this AM (will use APTT due to the effect of apixaban on heparin levels). No bleeding noted.   07/22/2019 CBC WNL  Goal of Therapy:  Heparin level 0.3-0.7 units/ml  APTT 66-102 seconds Monitor platelets by anticoagulation protocol   Plan:  Decrease heparin drip to 1200 units/hr APTT and HL in 6 hours Daily CBC F/u when to resume eliquis  Teasia Zapf A. Levada Dy, PharmD, BCPS, [facility] Clinical Pharmacist [place] Please utilize Amion for appropriate phone number to reach the unit pharmacist ([place])   07/22/2019, 8:54 AM

## 2019-07-22 NOTE — Progress Notes (Signed)
ANTICOAGULATION CONSULT NOTE - Follow Up Consult  Pharmacy Consult for heparin Indication: PE/DVT  Labs: Recent Labs    07/20/19 0817 07/20/19 0817 07/20/19 1949 07/20/19 2153 07/21/19 0233 07/21/19 1634 07/21/19 2321 07/22/19 0708 07/22/19 1522 07/22/19 2229  HGB 16.1   < >  --   --  13.5  --   --  14.0  --   --   HCT 48.3  --   --   --  40.7  --   --  42.1  --   --   PLT 160  --   --   --  147*  --   --  152  --   --   APTT 154*   < >  --   --   --  70*   < > 106* 83* 75*  LABPROT 13.6  --   --   --   --   --   --   --   --   --   INR 1.1  --   --   --   --   --   --   --   --   --   HEPARINUNFRC 0.75*   < >  --   --   --  1.98*  --  1.24* 1.09*  --   CREATININE  --   --   --   --  0.93  --   --   --   --   --   TROPONINIHS  --   --  4 5  --   --   --   --   --   --    < > = values in this interval not displayed.    Assessment/Plan:  54yo male remains therapeutic on heparin. Will continue gtt at current rate and monitor daily labs.   Wynona Neat, PharmD, BCPS  07/22/2019,11:33 PM

## 2019-07-22 NOTE — Progress Notes (Signed)
ANTICOAGULATION CONSULT NOTE - Follow Up Consult  Pharmacy Consult for heparin Indication: PE/DVT  Labs: Recent Labs    07/19/19 2035 07/19/19 2035 07/19/19 2222 07/20/19 0817 07/20/19 1949 07/20/19 2153 07/21/19 0233 07/21/19 1634 07/21/19 2321  HGB 15.9   < >  --  16.1  --   --  13.5  --   --   HCT 47.6  --   --  48.3  --   --  40.7  --   --   PLT 143*  --   --  160  --   --  147*  --   --   APTT  --   --   --  154*  --   --   --  70* 118*  LABPROT  --   --   --  13.6  --   --   --   --   --   INR  --   --   --  1.1  --   --   --   --   --   HEPARINUNFRC  --   --   --  0.75*  --   --   --  1.98*  --   CREATININE 0.98  --   --   --   --   --  0.93  --   --   TROPONINIHS 10   < > 15  --  4 5  --   --   --    < > = values in this interval not displayed.    Assessment: 54yo male now supratherapeutic on heparin after one PTT at goal; no gtt issues or signs of bleeding per RN.  Goal of Therapy:  aPTT 66-102 seconds   Plan:  Will decrease heparin gtt by 2 units/kg/hr to 1300 units/hr and check PTT in 6 hours.    Wynona Neat, PharmD, BCPS  07/22/2019,12:53 AM

## 2019-07-23 LAB — COMPREHENSIVE METABOLIC PANEL
ALT: 43 U/L (ref 0–44)
AST: 30 U/L (ref 15–41)
Albumin: 3.2 g/dL — ABNORMAL LOW (ref 3.5–5.0)
Alkaline Phosphatase: 54 U/L (ref 38–126)
Anion gap: 10 (ref 5–15)
BUN: 27 mg/dL — ABNORMAL HIGH (ref 6–20)
CO2: 22 mmol/L (ref 22–32)
Calcium: 8.6 mg/dL — ABNORMAL LOW (ref 8.9–10.3)
Chloride: 106 mmol/L (ref 98–111)
Creatinine, Ser: 1.03 mg/dL (ref 0.61–1.24)
GFR calc Af Amer: >60 mL/min (ref >60)
GFR calc non Af Amer: >60 mL/min (ref >60)
Glucose, Bld: 93 mg/dL (ref 70–99)
Potassium: 3.7 mmol/L (ref 3.5–5.1)
Sodium: 138 mmol/L (ref 135–145)
Total Bilirubin: 0.8 mg/dL (ref 0.3–1.2)
Total Protein: 6.2 g/dL — ABNORMAL LOW (ref 6.5–8.1)

## 2019-07-23 LAB — FERRITIN: Ferritin: 158 ng/mL (ref 24–336)

## 2019-07-23 LAB — CBC
HCT: 41 % (ref 39.0–52.0)
Hemoglobin: 13.7 g/dL (ref 13.0–17.0)
MCH: 30.8 pg (ref 26.0–34.0)
MCHC: 33.4 g/dL (ref 30.0–36.0)
MCV: 92.1 fL (ref 80.0–100.0)
Platelets: 165 10*3/uL (ref 150–400)
RBC: 4.45 MIL/uL (ref 4.22–5.81)
RDW: 12.7 % (ref 11.5–15.5)
WBC: 7.2 10*3/uL (ref 4.0–10.5)
nRBC: 0 % (ref 0.0–0.2)

## 2019-07-23 LAB — HEPARIN LEVEL (UNFRACTIONATED): Heparin Unfractionated: 0.81 IU/mL — ABNORMAL HIGH (ref 0.30–0.70)

## 2019-07-23 LAB — APTT: aPTT: 90 seconds — ABNORMAL HIGH (ref 24–36)

## 2019-07-23 LAB — C-REACTIVE PROTEIN: CRP: 1.5 mg/dL — ABNORMAL HIGH (ref <1.0)

## 2019-07-23 LAB — D-DIMER, QUANTITATIVE: D-Dimer, Quant: 2.62 ug/mL-FEU — ABNORMAL HIGH (ref 0.00–0.50)

## 2019-07-23 LAB — TSH: TSH: 1.493 u[IU]/mL (ref 0.350–4.500)

## 2019-07-23 MED ORDER — APIXABAN 5 MG PO TABS: 5.0000 mg | ORAL_TABLET | Freq: Two times a day (BID) | ORAL | Status: DC

## 2019-07-23 MED ORDER — APIXABAN 5 MG PO TABS
10.0000 mg | ORAL_TABLET | Freq: Two times a day (BID) | ORAL | Status: DC
  Administered 2019-07-23 – 2019-07-24 (×2): 10 mg via ORAL
  Filled 2019-07-23 (×2): qty 2

## 2019-07-23 NOTE — Progress Notes (Addendum)
PROGRESS NOTE                                                                                                                                                                                                             Patient Demographics:    [name], is a 54 y.o. male, DOB - 03-06-1966, [id number]  Outpatient Primary MD for the patient is Eulas Post, MD   Admit date - 07/19/2019   LOS - 4  Chief Complaint  Patient presents with  . Leg Pain  . Fever  . Shortness of Breath       Brief Narrative: Patient is a 54 y.o. male with PMHx of psoriatic arthritis on Enbrel-diagnosed with COVID-19 on 3/10 (but was having symptoms for almost a week before getting tested)-admitted to [facility] on 3/19 for shortness of breath-found to have large PE and left lower extremity DVT.  Transferred to [facility] for IR evaluation for possible catheter guided lytic therapy.  Significant Events: 3/19>> admit to [facility] for large PE with CT evidence of RV strain and LLE DVT 3/20>> TTE-normal RV systolic function.  EF 60-65% 3/21>> transferred to [facility] for IR evaluation for catheter guided lytic therapy  COVID-19 medications: Remdesivir: 3/19 x 1 Decadron: 3/19>>  DVT prophylaxis: IV heparin infusion>> Eliquis  Microbiology data: 3/19: Blood culture>> negative  Procedures: None  Consults: IR   Subjective:   Feels much better-ambulated with therapy services-really did not feel much short of breath.  We went over the risk of bleeding and necessary intervention in case he does have bleeding complications from Eliquis.   Assessment  & Plan :   Large PE/left lower extremity DVT: Likely provoked by COVID-19-echo without any RV strain.  Hemodynamically stable.  Treated with IV heparin-seen by IR-not a candidate for catheter guided lytic therapy.  On room air this morning-we will transition to Eliquis today.  If he remains stable overnight-I  suspect he could go home on 3/24.    COVID-19 infection: Shortness of breath is from PE-really no major parenchymal lesion seen on CT chest-however CRP is elevated.  Continue to watch off remdesivir-continue steroids.  OVID-19 Labs: Recent Labs    07/21/19 0233 07/23/19 0228  DDIMER 7.74* 2.62*  FERRITIN 145 158  CRP 6.4* 1.5*       Component Value Date/Time   BNP  20.1 07/19/2019 2140    Recent Labs  Lab 07/19/19 2035  PROCALCITON <0.05    Lab Results  Component Value Date   SARSCOV2NAA Detected (A) 07/10/2019   SARSCOV2NAA NOT DETECTED 04/06/2019   [place] Not Detected 03/15/2019     Sinus bradycardia: Asymptomatic-mostly nocturnal-suspect could have underlying OSA.  TSH this morning within normal limits.  Avoid rate controlling agents-have advised outpatient follow-up with cardiology/sleep study.   HTN: Blood pressure- fluctuating at times-continue amlodipine-follow.  History of psoriatic arthritis: Enbrel on hold-Currently on steroids for Covid 19 infection.  [place] hump deformity and lingula/left base/right lobe: Radiology recommends unenhanced CT chest in 3 months.  Obesity: Estimated body mass index is 36.04 kg/m as calculated from the following:   Height as of this encounter: 5\' 9"  (1.753 m).   Weight as of this encounter: 110.7 kg.    ABG: No results found for: PHART, PCO2ART, PO2ART, HCO3, TCO2, ACIDBASEDEF, O2SAT  Vent Settings: N/A  Condition - Guarded  Family Communication  :  Spouse updated over the phone 3/23  Code Status :  Full Code  Diet :  Diet Order            Diet Heart Room service appropriate? Yes; Fluid consistency: Thin  Diet effective now               Disposition Plan  : Home  Barriers to discharge: Hypoxia requiring O2 supplementation/IV heparin  Antimicorbials  :    Anti-infectives (From admission, onward)   Start     Dose/Rate Route Frequency Ordered Stop   07/21/19 1000  remdesivir 100 mg in sodium  chloride 0.9 % 100 mL IVPB  Status:  Discontinued     100 mg 200 mL/hr over 30 Minutes Intravenous Daily 07/20/19 0509 07/20/19 0556   07/21/19 1000  remdesivir 100 mg in sodium chloride 0.9 % 100 mL IVPB  Status:  Discontinued     100 mg 200 mL/hr over 30 Minutes Intravenous Daily 07/20/19 0158 07/21/19 0858   07/20/19 0509  remdesivir 200 mg in sodium chloride 0.9% 250 mL IVPB  Status:  Discontinued     200 mg 580 mL/hr over 30 Minutes Intravenous Once 07/20/19 0509 07/20/19 0556   07/20/19 0200  remdesivir 100 mg in sodium chloride 0.9 % 100 mL IVPB     100 mg 200 mL/hr over 30 Minutes Intravenous Every 1 hr x 2 07/20/19 0157 07/20/19 0559      Inpatient Medications  Scheduled Meds: . amLODipine  5 mg Oral Daily  . atropine  0.5 mg Intravenous Once  . Chlorhexidine Gluconate Cloth  6 each Topical Daily  . dexamethasone (DECADRON) injection  6 mg Intravenous Q24H  . mouth rinse  15 mL Mouth Rinse BID  . melatonin  6 mg Oral QHS  . pantoprazole  40 mg Oral Daily  . sodium chloride flush  3 mL Intravenous Q12H   Continuous Infusions: . heparin 1,200 Units/hr (07/23/19 1010)   PRN Meds:.acetaminophen, chlorpheniramine-HYDROcodone, guaiFENesin-dextromethorphan, hydrALAZINE, HYDROcodone-acetaminophen, ondansetron **OR** ondansetron (ZOFRAN) IV, polyethylene glycol   Time Spent in minutes  25  See all Orders from today for further details   Oren Binet M.D on 07/23/2019 at 2:02 PM  To page go to [email or web address] - use universal password  [facility] -  Office  [phone]    Objective:   Vitals:   07/23/19 0900 07/23/19 1050 07/23/19 1100 07/23/19 1115  BP: (!) 157/86  (!) 156/72   Pulse:      Resp:  20 20 (!) 22 20  Temp: 98.4 F (36.9 C)  (!) 97 F (36.1 C)   TempSrc: Oral  Oral   SpO2:      Weight:      Height:        Wt Readings from Last 3 Encounters:  07/20/19 110.7 kg  06/11/19 114.9 kg  04/10/19 111.6 kg     Intake/Output Summary (Last 24  hours) at 07/23/2019 1402 Last data filed at 07/23/2019 1050 Gross per 24 hour  Intake 822.03 ml  Output 575 ml  Net 247.03 ml     Physical Exam Gen Exam:Alert awake-not in any distress HEENT:atraumatic, normocephalic Chest: B/L clear to auscultation anteriorly CVS:S1S2 regular Abdomen:soft non tender, non distended Extremities mild swelling in the left lower extremity-some erythema still persists. Neurology: Non focal Skin: no rash   Data Review:    CBC Recent Labs  Lab 07/19/19 2035 07/20/19 0817 07/21/19 0233 07/22/19 0708 07/23/19 0228  WBC 8.4 5.1 8.9 6.8 7.2  HGB 15.9 16.1 13.5 14.0 13.7  HCT 47.6 48.3 40.7 42.1 41.0  PLT 143* 160 147* 152 165  MCV 93.2 92.4 91.9 90.9 92.1  MCH 31.1 30.8 30.5 30.2 30.8  MCHC 33.4 33.3 33.2 33.3 33.4  RDW 12.5 12.3 12.5 12.7 12.7  LYMPHSABS 1.6  --   --   --   --   MONOABS 0.4  --   --   --   --   EOSABS 0.1  --   --   --   --   BASOSABS 0.0  --   --   --   --     Chemistries  Recent Labs  Lab 07/19/19 2035 07/21/19 0233 07/23/19 0228  NA 137 140 138  K 3.9 4.5 3.7  CL 104 108 106  CO2 23 23 22   GLUCOSE 99 129* 93  BUN 19 26* 27*  CREATININE 0.98 0.93 1.03  CALCIUM 8.8* 8.8* 8.6*  MG  --  2.0  --   AST 22 19 30   ALT 32 27 43  ALKPHOS 75 60 54  BILITOT 1.0 0.7 0.8   ------------------------------------------------------------------------------------------------------------------ No results for input(s): CHOL, HDL, LDLCALC, TRIG, CHOLHDL, LDLDIRECT in the last 72 hours.  Lab Results  Component Value Date   HGBA1C 5.1 11/16/2016   ------------------------------------------------------------------------------------------------------------------ Recent Labs    07/23/19 0228  TSH 1.493   ------------------------------------------------------------------------------------------------------------------ Recent Labs    07/21/19 0233 07/23/19 0228  FERRITIN 145 158    Coagulation profile Recent Labs  Lab  07/20/19 0817  INR 1.1    Recent Labs    07/21/19 0233 07/23/19 0228  DDIMER 7.74* 2.62*    Cardiac Enzymes No results for input(s): CKMB, TROPONINI, MYOGLOBIN in the last 168 hours.  Invalid input(s): CK ------------------------------------------------------------------------------------------------------------------    Component Value Date/Time   BNP 20.1 07/19/2019 2140    Micro Results Recent Results (from the past 240 hour(s))  Blood Culture (routine x 2)     Status: None (Preliminary result)   Collection Time: 07/19/19  8:22 PM   Specimen: BLOOD  Result Value Ref Range Status   Specimen Description   Final    BLOOD RIGHT ANTECUBITAL Performed at [facility], [place] [address], [place], [place] [address]    Special Requests   Final    BOTTLES DRAWN AEROBIC AND ANAEROBIC Blood Culture results may not be optimal due to an excessive volume of blood received in culture bottles Performed at [facility]  [address], [place], [place] [address]    Culture NO GROWTH 4 DAYS  Final   Report Status PENDING  Incomplete  Blood Culture (routine x 2)     Status: None (Preliminary result)   Collection Time: 07/19/19  8:27 PM   Specimen: BLOOD  Result Value Ref Range Status   Specimen Description BLOOD RIGHT HAND  Final   Special Requests   Final    BOTTLES DRAWN AEROBIC AND ANAEROBIC Blood Culture results may not be optimal due to an excessive volume of blood received in culture bottles Performed at [facility], [place] [address] [place], [place] [address]    Culture NO GROWTH 3 DAYS  Final   Report Status PENDING  Incomplete  MRSA PCR Screening     Status: None   Collection Time: 07/20/19  8:48 PM   Specimen: Nasopharyngeal  Result Value Ref Range Status   MRSA by PCR NEGATIVE NEGATIVE Final    Comment:        The GeneXpert MRSA Assay (FDA approved for NASAL specimens only), is one component of a comprehensive MRSA  colonization surveillance program. It is not intended to diagnose MRSA infection nor to guide or monitor treatment for MRSA infections. Performed at [facility], [place] [address], [place], [place] [address]     Radiology Reports CT Angio Chest PE W and/or Wo Contrast  Result Date: 07/19/2019 CLINICAL DATA:  High probability for pulmonary embolism. Left calf pain. COVID-19 diagnosis 2 weeks ago. Dehydration. EXAM: CT ANGIOGRAPHY CHEST WITH CONTRAST TECHNIQUE: Multidetector CT imaging of the chest was performed using the standard protocol during bolus administration of intravenous contrast. Multiplanar CT image reconstructions and MIPs were obtained to evaluate the vascular anatomy. Automatic exposure control utilized. CONTRAST:  157mL OMNIPAQUE IOHEXOL 350 MG/ML SOLN COMPARISON:  None. FINDINGS: Cardiovascular: Occlusive thrombus in the right upper lobar arteries. Additional bilateral more peripheral pulmonary emboli including nonocclusive thrombus in the right medial lower lobar pulmonary artery. Pulmonary artery hypertension, the pulmonary trunk measuring 3.8 cm diameter. Borderline cardiomegaly with mild coronary calcification. Right ventricular to left ventricular ratio 1.2, consistent with right heart strain. No pericardial fluid. Mediastinum/Nodes: Subcentimeter noncalcified benign-appearing mediastinal lymph nodes. Small hiatal hernia. A 6 x 2 x 2 cm 22 Hounsfield unit cyst is present in the right cardiophrenic angle or slightly increased in size from 4 x 2 x 2 cm on the July 30, 2011 study. 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 Trace bilateral pleural fluid. Central pulmonary vascular congestion without overt pulmonary edema. Subpleural atelectasis. Upper Abdomen: Benign-appearing water density right renal cortical cysts measuring 8 mm in the midpole and 35 mm in the upper pole. Mild fatty infiltration of the pancreas.  Musculoskeletal: Moderate degenerative changes. Bridging anterior osteophytes in the lower thoracic spine. Review of the MIP images confirms the above findings. I discussed critical results by telephone at the time of interpretation on 07/19/2019 at 10:25 p.m. Eastern standard time with provider Isla Pence , who verbally acknowledged these results. IMPRESSION: Bilateral lobar and peripheral pulmonary arteries with right heart strain, the RV:LV ratio 1.2. Occlusive thrombus in the right upper lobar arteries. 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 Unenhanced CT chest recommended in 3 months. A benign pericardial recess cyst, slightly increased in size since 2013. No additional surveillance imaging is recommended. Borderline cardiomegaly with mild coronary calcification and pulmonary artery hypertension. Trace bilateral pleural fluid. Electronically Signed  By: Revonda Humphrey   On: 07/19/2019 22:34   DG Chest Port 1 View  Result Date: 07/19/2019 CLINICAL DATA:  Shortness of breath, COVID positive EXAM: PORTABLE CHEST 1 VIEW COMPARISON:  2012 FINDINGS: Low lung volumes. No consolidation or edema. No pleural effusion or pneumothorax cardiomediastinal silhouette is likely within normal limits for portable technique. IMPRESSION: No acute process in the chest. Electronically Signed   By: Macy Mis M.D.   On: 07/19/2019 21:05   ECHOCARDIOGRAM COMPLETE  Result Date: 07/20/2019    ECHOCARDIOGRAM REPORT   Patient Name:   TERREZ ROSALES Date of Exam: 07/20/2019 Medical Rec #:  [id number]      Height:       69.0 in Accession #:    [id number]     Weight:       236.3 lb Date of Birth:  13-Feb-1966       BSA:          2.218 m Patient Age:    65 years       BP:           141/82 mmHg Patient Gender: M              HR:           72 bpm. Exam Location:  Inpatient Procedure: 2D Echo, Cardiac Doppler and Color Doppler Indications:    I26.02 Pulmonary embolus  History:        Patient has  no prior history of Echocardiogram examinations.                 CV19.  Sonographer:    Merrie Roof RDCS Referring Phys: [id number] [place]  1. Left ventricular ejection fraction, by estimation, is 60 to 65%. The left ventricle has normal function. The left ventricle has no regional wall motion abnormalities. There is mild left ventricular hypertrophy. Left ventricular diastolic parameters were normal.  2. Right ventricular systolic function is normal. The right ventricular size is normal. Tricuspid regurgitation signal is inadequate for assessing PA pressure.  3. The mitral valve is grossly normal. Trivial mitral valve regurgitation.  4. The aortic valve is tricuspid. Aortic valve regurgitation is not visualized.  5. The inferior vena cava is normal in size with greater than 50% respiratory variability, suggesting right atrial pressure of 3 mmHg. FINDINGS  Left Ventricle: Left ventricular ejection fraction, by estimation, is 60 to 65%. The left ventricle has normal function. The left ventricle has no regional wall motion abnormalities. The left ventricular internal cavity size was normal in size. There is  mild left ventricular hypertrophy. Left ventricular diastolic parameters were normal. Right Ventricle: The right ventricular size is normal. No increase in right ventricular wall thickness. Right ventricular systolic function is normal. Tricuspid regurgitation signal is inadequate for assessing PA pressure. Left Atrium: Left atrial size was normal in size. Right Atrium: Right atrial size was normal in size. Pericardium: There is no evidence of pericardial effusion. Mitral Valve: The mitral valve is grossly normal. Trivial mitral valve regurgitation. Tricuspid Valve: The tricuspid valve is grossly normal. Tricuspid valve regurgitation is trivial. Aortic Valve: The aortic valve is tricuspid. Aortic valve regurgitation is not visualized. Mild aortic valve annular calcification. Pulmonic Valve: The  pulmonic valve was grossly normal. Pulmonic valve regurgitation is not visualized. Aorta: The aortic root is normal in size and structure. Venous: The inferior vena cava is normal in size with greater than 50% respiratory variability, suggesting right atrial pressure of 3 mmHg. IAS/Shunts:  No atrial level shunt detected by color flow Doppler.  LEFT VENTRICLE PLAX 2D LVIDd:         4.30 cm      Diastology LVIDs:         2.90 cm      LV e' lateral:   15.20 cm/s LV PW:         1.20 cm      LV E/e' lateral: 6.3 LV IVS:        1.30 cm      LV e' medial:    9.46 cm/s LVOT diam:     2.10 cm      LV E/e' medial:  10.1 LV SV:         68 LV SV Index:   30 LVOT Area:     3.46 cm  LV Volumes (MOD) LV vol d, MOD A4C: 122.0 ml LV vol s, MOD A4C: 38.4 ml LV SV MOD A4C:     122.0 ml RIGHT VENTRICLE          IVC RV Basal diam:  4.20 cm  IVC diam: 2.30 cm RV Mid diam:    3.50 cm LEFT ATRIUM             Index       RIGHT ATRIUM           Index LA diam:        3.50 cm 1.58 cm/m  RA Area:     18.50 cm LA Vol (A2C):   56.5 ml 25.48 ml/m RA Volume:   50.70 ml  22.86 ml/m LA Vol (A4C):   40.3 ml 18.17 ml/m LA Biplane Vol: 48.7 ml 21.96 ml/m  AORTIC VALVE LVOT Vmax:   88.70 cm/s LVOT Vmean:  61.300 cm/s LVOT VTI:    0.195 m  AORTA Ao Root diam: 2.90 cm Ao Asc diam:  3.20 cm MITRAL VALVE MV Area (PHT): 4.31 cm    SHUNTS MV Decel Time: 176 msec    Systemic VTI:  0.20 m MV E velocity: 95.10 cm/s  Systemic Diam: 2.10 cm MV A velocity: 68.40 cm/s MV E/A ratio:  1.39 Rozann Lesches MD Electronically signed by Rozann Lesches MD Signature Date/Time: 07/20/2019/2:59:52 PM    Final    CT VENOGRAM ABD/PEL  Result Date: 07/22/2019 CLINICAL DATA:  Symptomatic DVT of the left lower extremity with duplex ultrasound demonstrating thrombus in the femoral vein, popliteal vein and calf veins. Assessment for additional potential iliac vein thrombus. EXAM: CT VENOGRAM ABD-PELVIS TECHNIQUE: Multidetector CT imaging of the abdomen and pelvis was  performed using the standard protocol during bolus administration of intravenous contrast. Multiplanar reconstructed images and MIPs were obtained and reviewed to evaluate the vascular anatomy. Imaging was timed for venous evaluation of the abdomen and pelvis. CONTRAST:  12mL OMNIPAQUE IOHEXOL 350 MG/ML SOLN COMPARISON:  Results from a lower extremity venous duplex study on 07/20/2019 FINDINGS: VASCULAR Veins: The IVC and bilateral iliac veins are normally patent without evidence of thrombus, stenosis or anatomic variant. Common femoral veins are also normally patent bilaterally. No evidence of extrinsic compression of venous structures. Other venous structures including the renal veins, portal vein, splenic vein and mesenteric veins demonstrate normal patency. Review of the MIP images confirms the above findings. NON-VASCULAR Lower chest: Patchy peripheral opacities in the left lower lobe. Stable pericardial cyst at the right cardiophrenic sulcus. Hepatobiliary: No focal liver abnormality is seen. No gallstones, gallbladder wall thickening, or biliary dilatation. Pancreas: Unremarkable. No pancreatic ductal dilatation  or surrounding inflammatory changes. Spleen: Normal in size without focal abnormality. Adrenals/Urinary Tract: Adrenal glands are unremarkable. Kidneys are normal, without renal calculi, focal lesion, or hydronephrosis. Bladder is unremarkable. Stomach/Bowel: Visualized bowel demonstrates no evidence of obstruction, inflammation or lesion. No free air identified. Lymphatic: No enlarged lymph nodes identified in the abdomen or pelvis. Reproductive: Prostate is unremarkable. Other: No abdominal wall hernia or abnormality. No abdominopelvic ascites. Musculoskeletal: No acute or significant osseous findings. IMPRESSION: 1. Normal CT venogram of the abdomen and pelvis. No evidence of deep venous thrombosis involving the IVC, bilateral iliac veins or bilateral common femoral veins. 2. Patchy peripheral  opacities in the left lower lobe. 3. Stable pericardial cyst at the right cardiophrenic sulcus. 4. No acute findings in the abdomen or pelvis. Electronically Signed   By: Aletta Edouard M.D.   On: 07/22/2019 08:11   VAS [place] LOWER EXTREMITY VENOUS (DVT)  Result Date: 07/21/2019  Lower Venous DVTStudy Indications: Covid-19,, Swelling, and Pain.  Comparison Study: No prior LLE venous study on file Performing Technologist: Sharion Dove RVS  Examination Guidelines: A complete evaluation includes B-mode imaging, spectral Doppler, color Doppler, and power Doppler as needed of all accessible portions of each vessel. Bilateral testing is considered an integral part of a complete examination. Limited examinations for reoccurring indications may be performed as noted. The reflux portion of the exam is performed with the patient in reverse Trendelenburg.  +-----+---------------+---------+-----------+----------+--------------+ RIGHTCompressibilityPhasicitySpontaneityPropertiesThrombus Aging +-----+---------------+---------+-----------+----------+--------------+ CFV  Full           Yes      Yes                                 +-----+---------------+---------+-----------+----------+--------------+   +---------+---------------+---------+-----------+----------+--------------+ LEFT     CompressibilityPhasicitySpontaneityPropertiesThrombus Aging +---------+---------------+---------+-----------+----------+--------------+ CFV      Full           Yes      Yes                                 +---------+---------------+---------+-----------+----------+--------------+ SFJ      Full                                                        +---------+---------------+---------+-----------+----------+--------------+ FV Prox  Partial        Yes      Yes                  Acute          +---------+---------------+---------+-----------+----------+--------------+ FV Mid   None                                          Acute          +---------+---------------+---------+-----------+----------+--------------+ FV DistalNone                                         Acute          +---------+---------------+---------+-----------+----------+--------------+ PFV      Full  Yes      Yes                  Acute          +---------+---------------+---------+-----------+----------+--------------+ POP      Partial        No       No                   Acute          +---------+---------------+---------+-----------+----------+--------------+ PTV      None                                         Acute          +---------+---------------+---------+-----------+----------+--------------+ PERO     None                                         Acute          +---------+---------------+---------+-----------+----------+--------------+     Summary: RIGHT: - No evidence of common femoral vein obstruction.  LEFT: - Findings consistent with acute deep vein thrombosis involving the left femoral vein, left popliteal vein, left posterior tibial veins, and left peroneal veins.  *See table(s) above for measurements and observations. Electronically signed by Ruta Hinds MD on 07/21/2019 at 12:32:04 PM.    Final

## 2019-07-23 NOTE — Discharge Instructions (Addendum)
Person Under Monitoring Name: [name]  Location: [place] [address]   Infection Prevention Recommendations for Individuals Confirmed to have, or Being Evaluated for, 2019 Novel Coronavirus (COVID-19) Infection Who Receive Care at Home  Individuals who are confirmed to have, or are being evaluated for, COVID-19 should follow the prevention steps below until a healthcare provider or local or state health department says they can return to normal activities.  Stay home except to get medical care You should restrict activities outside your home, except for getting medical care. Do not go to work, school, or public areas, and do not use public transportation or taxis.  Call ahead before visiting your doctor Before your medical appointment, call the healthcare provider and tell them that you have, or are being evaluated for, COVID-19 infection. This will help the healthcare provider's office take steps to keep other people from getting infected. Ask your healthcare provider to call the local or state health department.  Monitor your symptoms Seek prompt medical attention if your illness is worsening (e.g., difficulty breathing). Before going to your medical appointment, call the healthcare provider and tell them that you have, or are being evaluated for, COVID-19 infection. Ask your healthcare provider to call the local or state health department.  Wear a facemask You should wear a facemask that covers your nose and mouth when you are in the same room with other people and when you visit a healthcare provider. People who live with or visit you should also wear a facemask while they are in the same room with you.  Separate yourself from other people in your home As much as possible, you should stay in a different room from other people in your home. Also, you should use a separate bathroom, if available.  Avoid sharing household items You should not  share dishes, drinking glasses, cups, eating utensils, towels, bedding, or other items with other people in your home. After using these items, you should wash them thoroughly with soap and water.  Cover your coughs and sneezes Cover your mouth and nose with a tissue when you cough or sneeze, or you can cough or sneeze into your sleeve. Throw used tissues in a lined trash can, and immediately wash your hands with soap and water for at least 20 seconds or use an alcohol-based hand rub.  Wash your [facility] your hands often and thoroughly with soap and water for at least 20 seconds. You can use an alcohol-based hand sanitizer if soap and water are not available and if your hands are not visibly dirty. Avoid touching your eyes, nose, and mouth with unwashed hands.   Prevention Steps for Caregivers and Household Members of Individuals Confirmed to have, or Being Evaluated for, COVID-19 Infection Being Cared for in the Home  If you live with, or provide care at home for, a person confirmed to have, or being evaluated for, COVID-19 infection please follow these guidelines to prevent infection:  Follow healthcare provider's instructions Make sure that you understand and can help the patient follow any healthcare provider instructions for all care.  Provide for the patient's basic needs You should help the patient with basic needs in the home and provide support for getting groceries, prescriptions, and other personal needs.  Monitor the patient's symptoms If they are getting sicker, call his or her medical provider and tell them that the patient has, or is being evaluated for, COVID-19 infection. This will help the  healthcare provider's office take steps to keep other people from getting infected. Ask the healthcare provider to call the local or state health department.  Limit the number of people who have contact with the patient  If possible, have only one caregiver for the  patient.  Other household members should stay in another home or place of residence. If this is not possible, they should stay  in another room, or be separated from the patient as much as possible. Use a separate bathroom, if available.  Restrict visitors who do not have an essential need to be in the home.  Keep older adults, very young children, and other sick people away from the patient Keep older adults, very young children, and those who have compromised immune systems or chronic health conditions away from the patient. This includes people with chronic heart, lung, or kidney conditions, diabetes, and cancer.  Ensure good ventilation Make sure that shared spaces in the home have good air flow, such as from an air conditioner or an opened window, weather permitting.  Wash your hands often  Wash your hands often and thoroughly with soap and water for at least 20 seconds. You can use an alcohol based hand sanitizer if soap and water are not available and if your hands are not visibly dirty.  Avoid touching your eyes, nose, and mouth with unwashed hands.  Use disposable paper towels to dry your hands. If not available, use dedicated cloth towels and replace them when they become wet.  Wear a facemask and gloves  Wear a disposable facemask at all times in the room and gloves when you touch or have contact with the patient's blood, body fluids, and/or secretions or excretions, such as sweat, saliva, sputum, nasal mucus, vomit, urine, or feces.  Ensure the mask fits over your nose and mouth tightly, and do not touch it during use.  Throw out disposable facemasks and gloves after using them. Do not reuse.  Wash your hands immediately after removing your facemask and gloves.  If your personal clothing becomes contaminated, carefully remove clothing and launder. Wash your hands after handling contaminated clothing.  Place all used disposable facemasks, gloves, and other waste in a lined  container before disposing them with other household waste.  Remove gloves and wash your hands immediately after handling these items.  Do not share dishes, glasses, or other household items with the patient  Avoid sharing household items. You should not share dishes, drinking glasses, cups, eating utensils, towels, bedding, or other items with a patient who is confirmed to have, or being evaluated for, COVID-19 infection.  After the person uses these items, you should wash them thoroughly with soap and water.  Wash laundry thoroughly  Immediately remove and wash clothes or bedding that have blood, body fluids, and/or secretions or excretions, such as sweat, saliva, sputum, nasal mucus, vomit, urine, or feces, on them.  Wear gloves when handling laundry from the patient.  Read and follow directions on labels of laundry or clothing items and detergent. In general, wash and dry with the warmest temperatures recommended on the label.  Clean all areas the individual has used often  Clean all touchable surfaces, such as counters, tabletops, doorknobs, bathroom fixtures, toilets, phones, keyboards, tablets, and bedside tables, every day. Also, clean any surfaces that may have blood, body fluids, and/or secretions or excretions on them.  Wear gloves when cleaning surfaces the patient has come in contact with.  Use a diluted bleach solution (e.g., dilute bleach  with 1 part bleach and 10 parts water) or a household disinfectant with a label that says EPA-registered for coronaviruses. To make a bleach solution at home, add 1 tablespoon of bleach to 1 quart (4 cups) of water. For a larger supply, add  cup of bleach to 1 gallon (16 cups) of water.  Read labels of cleaning products and follow recommendations provided on product labels. Labels contain instructions for safe and effective use of the cleaning product including precautions you should take when applying the product, such as wearing gloves or  eye protection and making sure you have good ventilation during use of the product.  Remove gloves and wash hands immediately after cleaning.  Monitor yourself for signs and symptoms of illness Caregivers and household members are considered close contacts, should monitor their health, and will be asked to limit movement outside of the home to the extent possible. Follow the monitoring steps for close contacts listed on the symptom monitoring form.   ? If you have additional questions, contact your local health department or call the epidemiologist on call at [phone] (available 24/7). ? This guidance is subject to change. For the most up-to-date guidance from [facility], please refer to their website: [email or web address] on my medicine - ELIQUIS (apixaban)  This medication education was reviewed with me or my healthcare representative as part of my discharge preparation.    Why was Eliquis prescribed for you? Eliquis was prescribed to treat blood clots that may have been found in the veins of your legs (deep vein thrombosis) or in your lungs (pulmonary embolism) and to reduce the risk of them occurring again.  What do You need to know about Eliquis ? The starting dose is 10 mg (two 5 mg tablets) taken TWICE daily for the FIRST SEVEN (7) DAYS, then on 07/30/19 in the evening the dose is reduced to ONE 5 mg tablet taken TWICE daily.  Eliquis may be taken with or without food.   Try to take the dose about the same time in the morning and in the evening. If you have difficulty swallowing the tablet whole please discuss with your pharmacist how to take the medication safely.  Take Eliquis exactly as prescribed and DO NOT stop taking Eliquis without talking to the doctor who prescribed the medication.  Stopping may increase your risk of developing a new blood clot.  Refill your prescription before you run out.  After  discharge, you should have regular check-up appointments with your healthcare provider that is prescribing your Eliquis.    What do you do if you miss a dose? If a dose of ELIQUIS is not taken at the scheduled time, take it as soon as possible on the same day and twice-daily administration should be resumed. The dose should not be doubled to make up for a missed dose.  Important Safety Information A possible side effect of Eliquis is bleeding. You should call your healthcare provider right away if you experience any of the following: ? Bleeding from an injury or your nose that does not stop. ? Unusual colored urine (red or dark brown) or unusual colored stools (red or black). ? Unusual bruising for unknown reasons. ? A serious fall or if you hit your head (even if there is no bleeding).  Some medicines may interact with Eliquis and might increase your risk of bleeding or clotting while on Eliquis. To help avoid this, consult your healthcare provider or pharmacist prior to using any new prescription or non-prescription medications, including  herbals, vitamins, non-steroidal anti-inflammatory drugs (NSAIDs) and supplements.  This website has more information on Eliquis (apixaban): http://www.eliquis.com/eliquis/home

## 2019-07-23 NOTE — Progress Notes (Addendum)
[place] for heparin Indication: pulmonary embolus  Allergies  Allergen Reactions  . Gold-Containing Drug Products Rash    Denies oral or airway involvement - occurred in the 1980s.     Patient Measurements: Height: 5\' 9"  (175.3 cm) Weight: 244 lb 0.8 oz (110.7 kg) IBW/kg (Calculated) : 70.7 Heparin Dosing Weight: 94kg  Vital Signs: Temp: 98.7 F (37.1 C) (03/23 0405) Temp Source: Oral (03/23 0405)  Labs: Recent Labs    07/20/19 0817 07/20/19 0817 07/20/19 1949 07/20/19 2153 07/21/19 0233 07/21/19 1634 07/22/19 0708 07/22/19 0708 07/22/19 1522 07/22/19 2229 07/23/19 0228  HGB 16.1   < >  --   --  13.5  --  14.0  --   --   --  13.7  HCT 48.3   < >  --   --  40.7  --  42.1  --   --   --  41.0  PLT 160   < >  --   --  147*  --  152  --   --   --  165  APTT 154*  --   --   --   --    < > 106*   < > 83* 75* 90*  LABPROT 13.6  --   --   --   --   --   --   --   --   --   --   INR 1.1  --   --   --   --   --   --   --   --   --   --   HEPARINUNFRC 0.75*  --   --   --   --    < > 1.24*  --  1.09*  --  0.81*  CREATININE  --   --   --   --  0.93  --   --   --   --   --  1.03  TROPONINIHS  --   --  4 5  --   --   --   --   --   --   --    < > = values in this interval not displayed.    Estimated Creatinine Clearance: 99.4 mL/min (by C-G formula based on SCr of 1.03 mg/dL).   Medical History: Past Medical History:  Diagnosis Date  . Anemia   . History of avascular necrosis of capital femoral epiphysis 2006, 2007   Bilateral Femoral Head  . HLD (hyperlipidemia)   . Pneumonia    twice  . Psoriatic arthritis [facility])     Assessment: Patient's a 54 y.o M who was diagnosed with COVID one week prior to adm (07/10/19), presented to the ED on 3/19 with c/o SOB, cough, fatigue and left leg swelling. D-dimer on admission was elevated and chest CTA showed bilateral PE with right heart strain.  He was started on heparin for acute PE on  3/19.  This AM, APTT came back therapeutic at 90, heparin level is supratherapeutic at 0.81- on heparin 1200 units/hr. Hgb 13.7, plt 165. No infusion issues. Dressing at IV site bleeding - will continue to monitor. Heparin levels not yet correlating with APTT.   Goal of Therapy:  Heparin level 0.3-0.7 units/ml  APTT 66-102 seconds Monitor platelets by anticoagulation protocol   Plan:  Continue heparin drip at 1200 units/hr Daily CBC, s/sx of bleeding  F/u when to resume eliquis  Connar Keating A. Levada Dy, PharmD, BCPS,  [facility] Clinical Pharmacist [place] Please utilize Amion for appropriate phone number to reach the unit pharmacist ([place])  Addendum:   MD wishes to transition patient to apixaban today. Due to timing of doses will start apixaban at [place] and d/c heparin at 2000 as well.   Plan:  D/C heparin at 2000 with first dose of apixaban.  Apixaban 10mg  PO BID x 7 days then 5mg  BID thereafter Patient to be educated prior to discharge   Lytle [place] A. Levada Dy, PharmD, BCPS, [facility] Clinical Pharmacist  Please utilize Amion for appropriate phone number to reach the unit pharmacist ([place])   07/23/2019, 7:39 AM

## 2019-07-23 NOTE — Progress Notes (Signed)
Physical Therapy Evaluation  Patient presents at his baseline level of mobility. Patient reports he feels at his baseline level of mobility. No further PT services recommended.    07/23/19 1450  PT Visit Information  Last PT Received On 07/23/19  Assistance Needed +1  History of Present Illness 54 year old male admitted 07/19/19, transferred from [facility] 3/19 for SOB and found to have large PE, CT evidence of RV strain, and LLE DDVT. Patient known COVID positive 3/10 with symptoms for almost a week prior to test. Transfer to [facility] for IR evaluation for possible catheter guided lytic therapy but patient not a candidate. Treated with IV Heparin with plan to transition to Eliquis. PMH: psoriatric arthrits (on Enbrel injections weekly)  Precautions  Precaution Comments +COVID  Restrictions  Weight Bearing Restrictions No  Home Living  Family/patient expects to be discharged to: Private residence  Living Arrangements Spouse/significant other  Available Help at Discharge  (wife is recovering from surgery and is COVID negative)  Type of [place] to enter  Entrance Stairs-Number of Steps 1  Entrance Stairs-Rails None  Home Layout Two level;1/2 bath on main level;Bed/bath upstairs  Alternate Level Stairs-Number of Steps 13 with landing  Alternate Level Stairs-Rails Right  Bathroom Shower/Tub Tub/shower unit  [place] - single point;Crutches;Walker - 2 wheels;BSC  Additional Comments Pt sleeps in recliner or "back chair" on 2nd floor. Has DME from prior hip surgeries, most recent Dec 2020.  Prior Function  Level of Independence Independent  Comments Independent PTA, no AD, 10-13K steps per day per patient, disabled from work due to psoriatic arthritis  Communication  Communication No difficulties  Pain Assessment  Pain Assessment Faces  Faces Pain Scale 2  Pain Location  (reports his normal psoriatic arthritis pain/stiffness)  Pain Descriptors / Indicators Other  (Comment) (stiffness)  Pain Intervention(s) Monitored during session  Cognition  Arousal/Alertness Awake/alert  Behavior During Therapy WFL for tasks assessed/performed  Overall Cognitive Status Within Functional Limits for tasks assessed  Lower Extremity Assessment  Lower Extremity Assessment  (BLE strength grossly 4+/5, hip flexion >/= 3/5)  Bed Mobility  General bed mobility comments Patient already in chair upon PT arrival. He sleeps in a recliner/"back" chair at baseline.  Transfers  Overall transfer level Independent  Equipment used None  Ambulation/Gait  Ambulation/Gait assistance Independent  Gait Distance (Feet) 150 Feet  Assistive device None  Gait Pattern/deviations WFL(Within Functional Limits)  General Gait Details On room air, oxygen saturation 95% during ambulation, HR 85 bpm.  Gait velocity WNL  Stairs Yes  Stairs assistance Modified independent (Device/Increase time)  Stair Management No rails;One rail Right;One rail Left (L then R rail, no rail intermittently)  Number of Stairs 6  Balance  Overall balance assessment No apparent balance deficits (not formally assessed)  General Comments  General comments (skin integrity, edema, etc.) On room air, tele not reading pulse ox despite new probe applied. Oxygen saturation checked on portable monitor in hallway during gait trial.  PT - End of Session  Activity Tolerance Patient tolerated treatment well  Patient left in chair;with call bell/phone within reach  Nurse Communication Mobility status;Other (comment) (pulse oximeter not working)  PT Assessment  PT Recommendation/Assessment Patent does not need any further PT services  No Skilled PT Patient at baseline level of functioning  AM-PAC PT "6 Clicks" Mobility Outcome Measure (Version 2)  Help needed turning from your back to your side while in a flat bed without using bedrails? 4  Help needed moving from lying on your back to sitting on the side of a flat bed  without using bedrails? 4  Help needed moving to and from a bed to a chair (including a wheelchair)? 4  Help needed standing up from a chair using your arms (e.g., wheelchair or bedside chair)? 4  Help needed to walk in hospital room? 4  Help needed climbing 3-5 steps with a railing?  4  6 Click Score 24  Consider Recommendation of Discharge To: Home with no services  PT Recommendation  Follow Up Recommendations No PT follow up  PT equipment None recommended by PT  Acute Rehab PT Goals  Patient Stated Goal go home tomorrow  PT Goal Formulation With patient  PT Time Calculation  PT Start Time (ACUTE ONLY) 1450  PT Stop Time (ACUTE ONLY) 1515  PT Time Calculation (min) (ACUTE ONLY) 25 min  PT General Charges  $$ ACUTE PT VISIT 1 Visit  PT Evaluation  $PT Eval Low Complexity 1 Low   Birdie Hopes, PT, DPT Acute Rehab [phone] office

## 2019-07-23 NOTE — Care Management (Signed)
CM reviewed cost of Eliquis with pt per benefit check.  CM attempted to confirm with pt if he has medicare or medicaid - he states he isn't sure.  CM printed the reduced copay card to unit and asked that it be given to pt prior to discharge.  Pt informed me that he already has the free 30 day coupon.  CM explained that the pharmacy will be able to determine when they fill the refill for Eliquis if he can or can not use the reduced copay benefit.  Pt informed CM that he is independent from home with this wife.  Pt also has an active relationship with his PCP.  TOC will continue to follow

## 2019-07-23 NOTE — Progress Notes (Addendum)
 Occupational Therapy Evaluation Only Patient Details Name: [name] MRN: [id number] DOB: 1966-02-12 Today's Date: 07/23/2019    History of Present Illness Patient is a 54 y.o. male with PMHx of psoriatic arthritis on Enbrel-diagnosed with COVID-19 on 3/10 (but was having symptoms for almost a week before getting tested)-admitted to [facility] on 3/19 for shortness of breath-found to have large PE and left lower extremity DVT.  Transferred to [facility] for IR evaluation for possible catheter guided lytic therapy.   Clinical Impression   PTA, pt lives with wife in two-level home. Pt was Independent with ADLs, IADLs, and mobility. Pt was active in the community, frequently walking 10,000-12,000 steps during walks. Presently, pt on RA with VSS during session. Pt Independent for sit to stand, no LOB when external forces applied. Pt Independent for stand pivot transfers without AD, progressed to Independence for hallway distance mobility without SOB. Pt setup for donning hospital gown around back in standing, assistance only needed due to safe line mgmt. Pt Independent for mgmt of footwear. No safety concerns noted. Pt reports feeling back to baseline. No OT follow up needed at DC. No further skilled acute OT services needed.    Follow Up Recommendations  No OT follow up    Equipment Recommendations  None recommended by OT    Recommendations for Other Services       Precautions / Restrictions Precautions Precautions: Other (comment) Precaution Comments: COVID+ Restrictions Weight Bearing Restrictions: No      Mobility Bed Mobility               General bed mobility comments: up in recliner on entry  Transfers Overall transfer level: Independent Equipment used: None             General transfer comment: Independent for sit to stand and stand pivot without AD, no LOB     Balance Overall balance assessment: No apparent balance deficits (not formally assessed)                                         ADL either performed or assessed with clinical judgement   ADL Overall ADL's : Independent;Needs assistance/impaired Eating/Feeding: Independent   Grooming: Independent;Standing   Upper Body Bathing: Independent;Sitting;Standing   Lower Body Bathing: Independent;Sit to/from stand   Upper Body Dressing : Set up;Standing Upper Body Dressing Details (indicate cue type and reason): Setup for IV line mgmt for donning hospital gown in standing Lower Body Dressing: Independent;Set up;Sit to/from stand;Sitting/lateral leans Lower Body Dressing Details (indicate cue type and reason): able to manage socks Independently  Toilet Transfer: Independent;Stand-pivot   Toileting- Clothing Manipulation and Hygiene: Independent;Set up;Sit to/from stand       Functional mobility during ADLs: Independent General ADL Comments: Grossly Independent for ADLs, only setup required at times due to line mgmt and constraints of leads. Otherwise, pt would not need assistance and reports feeling like normal self      Vision         Perception     Praxis      Pertinent Vitals/Pain Pain Assessment: Faces Faces Pain Scale: No hurt     Hand Dominance Right   Extremity/Trunk Assessment Upper Extremity Assessment Upper Extremity Assessment: Overall WFL for tasks assessed   Lower Extremity Assessment Lower Extremity Assessment: Defer to PT evaluation   Cervical / Trunk Assessment Cervical / Trunk Assessment: Normal   Communication Communication  Communication: No difficulties   Cognition Arousal/Alertness: Awake/alert Behavior During Therapy: WFL for tasks assessed/performed Overall Cognitive Status: Within Functional Limits for tasks assessed                                     General Comments  Pt HR 80s on exertion, 60s at rest. Pt reports feeling back to baseline     Exercises     Shoulder Instructions      Home Living Family/patient  expects to be discharged to:: Private residence Living Arrangements: Spouse/significant other Available Help at Discharge: Family Type of Home: House Home Access: Stairs to enter Technical  of Steps: 1 Entrance Stairs-Rails: None Home Layout: Two level;1/2 bath on main level;Bed/bath upstairs Alternate Level Stairs-Number of Steps: 13 with landing Alternate Level Stairs-Rails: Right Bathroom Shower/Tub: Tub/shower unit         Home Equipment: Cane - single point;Crutches;Walker - 2 wheels;Bedside commode          Prior Functioning/Environment Level of Independence: Independent        Comments: Pt Independent for ADLs, IADLs, mobility and driving. Pt did not use AD at baseline (used can briefly after hip surgery). Pt was active in community with church and was walking 10,000-12,000 steps on daily walks         OT Problem List:        OT Treatment/Interventions:      OT Goals(Current goals can be found in the care plan section) Acute Rehab OT Goals Patient Stated Goal: go home tomorrow  OT Frequency:     Barriers to D/C:            Co-evaluation              AM-PAC OT "6 Clicks" Daily Activity     Outcome Measure Help from another person eating meals?: None Help from another person taking care of personal grooming?: None Help from another person toileting, which includes using toliet, bedpan, or urinal?: None Help from another person bathing (including washing, rinsing, drying)?: None Help from another person to put on and taking off regular upper body clothing?: None Help from another person to put on and taking off regular lower body clothing?: None 6 Click Score: 24   End of Session Equipment Utilized During Treatment: Gait belt Nurse Communication: Mobility status;Other (comment)(IV line issues)  Activity Tolerance: Patient tolerated treatment well Patient left: in chair;with call bell/phone within reach                   Time:  [id number] OT Time Calculation (min): 24 min Charges:  OT Evaluation $OT Eval Moderate Complexity: 1 Mod OT Treatments $Therapeutic Activity: 8-22 mins  Layla Maw, OTR/L  Layla Maw 07/23/2019, 12:46 PM

## 2019-07-24 DIAGNOSIS — I82403 Acute embolism and thrombosis of unspecified deep veins of lower extremity, bilateral: Secondary | ICD-10-CM | POA: Diagnosis present

## 2019-07-24 DIAGNOSIS — I10 Essential (primary) hypertension: Secondary | ICD-10-CM | POA: Diagnosis present

## 2019-07-24 LAB — COMPREHENSIVE METABOLIC PANEL
ALT: 48 U/L — ABNORMAL HIGH (ref 0–44)
AST: 27 U/L (ref 15–41)
Albumin: 3.2 g/dL — ABNORMAL LOW (ref 3.5–5.0)
Alkaline Phosphatase: 55 U/L (ref 38–126)
Anion gap: 10 (ref 5–15)
BUN: 21 mg/dL — ABNORMAL HIGH (ref 6–20)
CO2: 22 mmol/L (ref 22–32)
Calcium: 8.7 mg/dL — ABNORMAL LOW (ref 8.9–10.3)
Chloride: 105 mmol/L (ref 98–111)
Creatinine, Ser: 1.14 mg/dL (ref 0.61–1.24)
GFR calc Af Amer: >60 mL/min (ref >60)
GFR calc non Af Amer: >60 mL/min (ref >60)
Glucose, Bld: 94 mg/dL (ref 70–99)
Potassium: 3.9 mmol/L (ref 3.5–5.1)
Sodium: 137 mmol/L (ref 135–145)
Total Bilirubin: 0.7 mg/dL (ref 0.3–1.2)
Total Protein: 6.2 g/dL — ABNORMAL LOW (ref 6.5–8.1)

## 2019-07-24 LAB — CULTURE, BLOOD (ROUTINE X 2): Culture: NO GROWTH

## 2019-07-24 LAB — FERRITIN: Ferritin: 137 ng/mL (ref 24–336)

## 2019-07-24 LAB — CBC
HCT: 41.1 % (ref 39.0–52.0)
Hemoglobin: 13.8 g/dL (ref 13.0–17.0)
MCH: 30.6 pg (ref 26.0–34.0)
MCHC: 33.6 g/dL (ref 30.0–36.0)
MCV: 91.1 fL (ref 80.0–100.0)
Platelets: 165 10*3/uL (ref 150–400)
RBC: 4.51 MIL/uL (ref 4.22–5.81)
RDW: 12.7 % (ref 11.5–15.5)
WBC: 6.8 10*3/uL (ref 4.0–10.5)
nRBC: 0 % (ref 0.0–0.2)

## 2019-07-24 LAB — C-REACTIVE PROTEIN: CRP: 1 mg/dL — ABNORMAL HIGH (ref <1.0)

## 2019-07-24 LAB — D-DIMER, QUANTITATIVE: D-Dimer, Quant: 1.95 ug/mL-FEU — ABNORMAL HIGH (ref 0.00–0.50)

## 2019-07-24 MED ORDER — APIXABAN 5 MG PO TABS: 5.0000 mg | ORAL_TABLET | Freq: Two times a day (BID) | ORAL | 0 refills | Status: DC

## 2019-07-24 MED ORDER — APIXABAN 5 MG PO TABS: 10.0000 mg | ORAL_TABLET | Freq: Two times a day (BID) | ORAL | 0 refills | Status: DC

## 2019-07-24 MED ORDER — AMLODIPINE BESYLATE 5 MG PO TABS: 5.0000 mg | ORAL_TABLET | Freq: Every day | ORAL | 0 refills | Status: DC

## 2019-07-24 MED FILL — ELIQUIS STARTER PACK 5 MG T: 5 | 30 days supply | Qty: 74 | Fill #0

## 2019-07-24 NOTE — Care Management (Signed)
Pt deemed stable for discharge home today.  CM confirmed with pt that he has not yet been given the reduced copay card for Eliquis as of yet.  CM requested bedside nurse to ensure copay form printed on yesterday is given to pt prior to discharge.  No other CM needs determined - CM signing off

## 2019-07-24 NOTE — Plan of Care (Signed)
  Problem: Respiratory: Goal: Will maintain a patent airway Outcome: Progressing Goal: Complications related to the disease process, condition or treatment will be avoided or minimized Outcome: Progressing   

## 2019-07-24 NOTE — Discharge Summary (Signed)
PATIENT DETAILS Name: [name] Age: 54 y.o. Sex: male Date of Birth: 04/15/1966 MRN: [id number]. Admitting Physician: Vianne Bulls, MD [id number], [name], MD  Admit Date: 07/19/2019 Discharge date: 07/24/2019  Recommendations for Outpatient Follow-up:  1. Follow up with PCP in 1-2 weeks 2. Please obtain CMP/CBC in one week 3. New medication-Eliquis for large PE/DVT. 4. Suggest outpatient referral to hematology to determine appropriate duration of anticoagulation/if any further work-up needed for VTE. 5. Age-appropriate cancer screening/General health maintenance. 6. Suggest outpatient sleep study-sinus bradycardia-mostly at night during this hospital stay. 7. Radiology recommending unenhanced CT scan in 3 months to evaluate Hamptons hump deformity-see below.  Admitted From:  Home  Disposition: [place]: No  Equipment/Devices: None  Discharge Condition: Stable  CODE STATUS: FULL CODE  Diet recommendation:  Diet Order            Diet - low sodium heart healthy        Diet Heart Room service appropriate? Yes; Fluid consistency: Thin  Diet effective now               Brief Narrative: Patient is a 54 y.o. male with PMHx of psoriatic arthritis on Enbrel-diagnosed with COVID-19 on 3/10 (but was having symptoms for almost a week before getting tested)-admitted to [facility] on 3/19 for shortness of breath-found to have large PE and left lower extremity DVT.  Transferred to [facility] for IR evaluation for possible catheter guided lytic therapy.  Significant Events: 3/19>> admit to [facility] for large PE with CT evidence of RV strain and LLE DVT 3/20>> TTE-normal RV systolic function.  EF 60-65% 3/21>> transferred to [facility] for IR evaluation for catheter guided lytic therapy  COVID-19 medications: Remdesivir: 3/19 x 1 Decadron: 3/19>>3/24  DVT prophylaxis: IV heparin infusion>> Eliquis  Microbiology data: 3/19: Blood culture>>  negative  Procedures: None  Consults: IR  Brief Hospital Course: Large PE/left lower extremity DVT: Likely provoked by COVID-19-echo without any RV strain.  Hemodynamically stable.  Treated with IV heparin-seen by IR-not a candidate for catheter guided lytic therapy.    Transitioned to Eliquis on 3/23-which he seems to be tolerating well.  Remains on room air this morning.  Ambulated with physical therapy 150 feet without any major shortness of breath-not hypoxic with ambulation.  Since overall improved-not hypoxic-stable for discharge home today.  We will continue with Eliquis on discharge.    Given his large clot burden- will require uninterrupted anticoagulation for at least 9 to 12 months-I have asked him to get a referral from his primary care practitioner for a hematology evaluation to determine appropriate duration of anticoagulation-and to see if any further work-up is required as well.  COVID-19 infection: Shortness of breath is from PE-really no major parenchymal lesion seen on CT chest-however CRP is elevated.  Continue to watch off remdesivir-treated with steroids-CRP has normalized.  Does not require any steroids on discharge.  COVID-19 Labs:  Recent Labs    07/23/19 0228 07/24/19 0419  DDIMER 2.62* 1.95*  FERRITIN 158 137  CRP 1.5* 1.0*    Lab Results  Component Value Date   SARSCOV2NAA Detected (A) 07/10/2019   SARSCOV2NAA NOT DETECTED 04/06/2019   [place] Not Detected 03/15/2019     Sinus bradycardia: Asymptomatic-mostly nocturnal-suspect could have underlying OSA.  TSH within normal limits.  Avoid rate controlling agents-have advised outpatient follow-up with cardiology/sleep study.   HTN: Blood pressure- controlled this morning-continue low-dose amlodipine.  Follow with PCP for further optimization.  History of psoriatic arthritis: Enbrel held on admission-suspect could be resumed on discharge.  Follow with PCP/primary rheumatologist.  Hamptons  hump deformity and lingula/left base/right lobe: Radiology recommends unenhanced CT chest in 3 months.  Obesity: Estimated body mass index is 36.04 kg/m as calculated from the following:   Height as of this encounter: 5\' 9"  (1.753 m).   Weight as of this encounter: 110.7 kg.    Discharge Diagnoses:  Principal Problem:   Acute pulmonary embolism ([place]) Active Problems:   COVID-19 virus infection   Left leg DVT   HTN   Discharge Instructions:    Person Under Monitoring Name: DALY EOFF  Location: [place] [id number]   Infection Prevention Recommendations for Individuals Confirmed to have, or Being Evaluated for, 2019 Novel Coronavirus (COVID-19) Infection Who Receive Care at Home  Individuals who are confirmed to have, or are being evaluated for, COVID-19 should follow the prevention steps below until a healthcare provider or local or state health department says they can return to normal activities.  Stay home except to get medical care You should restrict activities outside your home, except for getting medical care. Do not go to work, school, or public areas, and do not use public transportation or taxis.  Call ahead before visiting your doctor Before your medical appointment, call the healthcare provider and tell them that you have, or are being evaluated for, COVID-19 infection. This will help the healthcare provider's office take steps to keep other people from getting infected. Ask your healthcare provider to call the local or state health department.  Monitor your symptoms Seek prompt medical attention if your illness is worsening (e.g., difficulty breathing). Before going to your medical appointment, call the healthcare provider and tell them that you have, or are being evaluated for, COVID-19 infection. Ask your healthcare provider to call the local or state health department.  Wear a facemask You should wear a facemask that covers your  nose and mouth when you are in the same room with other people and when you visit a healthcare provider. People who live with or visit you should also wear a facemask while they are in the same room with you.  Separate yourself from other people in your home As much as possible, you should stay in a different room from other people in your home. Also, you should use a separate bathroom, if available.  Avoid sharing household items You should not share dishes, drinking glasses, cups, eating utensils, towels, bedding, or other items with other people in your home. After using these items, you should wash them thoroughly with soap and water.  Cover your coughs and sneezes Cover your mouth and nose with a tissue when you cough or sneeze, or you can cough or sneeze into your sleeve. Throw used tissues in a lined trash can, and immediately wash your hands with soap and water for at least 20 seconds or use an alcohol-based hand rub.  Wash your [facility] your hands often and thoroughly with soap and water for at least 20 seconds. You can use an alcohol-based hand sanitizer if soap and water are not available and if your hands are not visibly dirty. Avoid touching your eyes, nose, and mouth with unwashed hands.   Prevention Steps for Caregivers and Household Members of Individuals Confirmed to have, or Being Evaluated for, COVID-19 Infection Being Cared for in the Home  If you live with, or provide care at home for, a person confirmed to  have, or being evaluated for, COVID-19 infection please follow these guidelines to prevent infection:  Follow healthcare provider's instructions Make sure that you understand and can help the patient follow any healthcare provider instructions for all care.  Provide for the patient's basic needs You should help the patient with basic needs in the home and provide support for getting groceries, prescriptions, and other personal needs.  Monitor the  patient's symptoms If they are getting sicker, call his or her medical provider and tell them that the patient has, or is being evaluated for, COVID-19 infection. This will help the healthcare provider's office take steps to keep other people from getting infected. Ask the healthcare provider to call the local or state health department.  Limit the number of people who have contact with the patient  If possible, have only one caregiver for the patient.  Other household members should stay in another home or place of residence. If this is not possible, they should stay  in another room, or be separated from the patient as much as possible. Use a separate bathroom, if available.  Restrict visitors who do not have an essential need to be in the home.  Keep older adults, very young children, and other sick people away from the patient Keep older adults, very young children, and those who have compromised immune systems or chronic health conditions away from the patient. This includes people with chronic heart, lung, or kidney conditions, diabetes, and cancer.  Ensure good ventilation Make sure that shared spaces in the home have good air flow, such as from an air conditioner or an opened window, weather permitting.  Wash your hands often  Wash your hands often and thoroughly with soap and water for at least 20 seconds. You can use an alcohol based hand sanitizer if soap and water are not available and if your hands are not visibly dirty.  Avoid touching your eyes, nose, and mouth with unwashed hands.  Use disposable paper towels to dry your hands. If not available, use dedicated cloth towels and replace them when they become wet.  Wear a facemask and gloves  Wear a disposable facemask at all times in the room and gloves when you touch or have contact with the patient's blood, body fluids, and/or secretions or excretions, such as sweat, saliva, sputum, nasal mucus, vomit, urine, or feces.   Ensure the mask fits over your nose and mouth tightly, and do not touch it during use.  Throw out disposable facemasks and gloves after using them. Do not reuse.  Wash your hands immediately after removing your facemask and gloves.  If your personal clothing becomes contaminated, carefully remove clothing and launder. Wash your hands after handling contaminated clothing.  Place all used disposable facemasks, gloves, and other waste in a lined container before disposing them with other household waste.  Remove gloves and wash your hands immediately after handling these items.  Do not share dishes, glasses, or other household items with the patient  Avoid sharing household items. You should not share dishes, drinking glasses, cups, eating utensils, towels, bedding, or other items with a patient who is confirmed to have, or being evaluated for, COVID-19 infection.  After the person uses these items, you should wash them thoroughly with soap and water.  Wash laundry thoroughly  Immediately remove and wash clothes or bedding that have blood, body fluids, and/or secretions or excretions, such as sweat, saliva, sputum, nasal mucus, vomit, urine, or feces, on them.  Wear gloves when  handling laundry from the patient.  Read and follow directions on labels of laundry or clothing items and detergent. In general, wash and dry with the warmest temperatures recommended on the label.  Clean all areas the individual has used often  Clean all touchable surfaces, such as counters, tabletops, doorknobs, bathroom fixtures, toilets, phones, keyboards, tablets, and bedside tables, every day. Also, clean any surfaces that may have blood, body fluids, and/or secretions or excretions on them.  Wear gloves when cleaning surfaces the patient has come in contact with.  Use a diluted bleach solution (e.g., dilute bleach with 1 part bleach and 10 parts water) or a household disinfectant with a label that says  EPA-registered for coronaviruses. To make a bleach solution at home, add 1 tablespoon of bleach to 1 quart (4 cups) of water. For a larger supply, add  cup of bleach to 1 gallon (16 cups) of water.  Read labels of cleaning products and follow recommendations provided on product labels. Labels contain instructions for safe and effective use of the cleaning product including precautions you should take when applying the product, such as wearing gloves or eye protection and making sure you have good ventilation during use of the product.  Remove gloves and wash hands immediately after cleaning.  Monitor yourself for signs and symptoms of illness Caregivers and household members are considered close contacts, should monitor their health, and will be asked to limit movement outside of the home to the extent possible. Follow the monitoring steps for close contacts listed on the symptom monitoring form.   ? If you have additional questions, contact your local health department or call the epidemiologist on call at [phone] (available 24/7). ? This guidance is subject to change. For the most up-to-date guidance from [facility], please refer to their website: YouBlogs.pl    Activity:  As tolerated   Discharge Instructions    Call MD for:   Complete by: As directed    Seek immediate medical attention if you have black or bloody stools or coffee-ground/bloody vomiting.   Call MD for:  difficulty breathing, headache or visual disturbances   Complete by: As directed    Call MD for:  persistant dizziness or light-headedness   Complete by: As directed    Call MD for:  severe uncontrolled pain   Complete by: As directed    Diet - low sodium heart healthy   Complete by: As directed    Discharge instructions   Complete by: As directed    1.)  3 weeks of isolation from 07/10/2019  2.)  Please ask your primary care practitioner for referral  to hematologist to determine appropriate duration of anticoagulation-and also to make sure you do not require any further work-up for your large blood clots.  3.)  Continue Eliquis as instructed-please call your primary care practitioner for further refills.  4.)  Radiologist is recommending a noncontrast CT chest to be repeated in 3 months to evaluate a abnormality called Hamptons hump-please discuss this with your primary care practitioner at your next visit.   Follow with Primary MD  Eulas Post, MD in 1-2 weeks  Please get a complete blood count and chemistry panel checked by your Primary MD at your next visit, and again as instructed by your Primary MD.  Get Medicines reviewed and adjusted: Please take all your medications with you for your next visit with your Primary MD  Laboratory/radiological data: Please request your Primary MD to go over all hospital tests and  procedure/radiological results at the follow up, please ask your Primary MD to get all Hospital records sent to his/her office.  In some cases, they will be blood work, cultures and biopsy results pending at the time of your discharge. Please request that your primary care M.D. follows up on these results.  Also Note the following: If you experience worsening of your admission symptoms, develop shortness of breath, life threatening emergency, suicidal or homicidal thoughts you must seek medical attention immediately by calling 911 or calling your MD immediately  if symptoms less severe.  You must read complete instructions/literature along with all the possible adverse reactions/side effects for all the Medicines you take and that have been prescribed to you. Take any new Medicines after you have completely understood and accpet all the possible adverse reactions/side effects.   Do not drive when taking Pain medications or sleeping medications (Benzodaizepines)  Do not take more than prescribed Pain, Sleep and Anxiety  Medications. It is not advisable to combine anxiety,sleep and pain medications without talking with your primary care practitioner  Special Instructions: If you have smoked or chewed Tobacco  in the last 2 yrs please stop smoking, stop any regular Alcohol  and or any Recreational drug use.  Wear Seat belts while driving.  Please note: You were cared for by a hospitalist during your hospital stay. Once you are discharged, your primary care physician will handle any further medical issues. Please note that NO REFILLS for any discharge medications will be authorized once you are discharged, as it is imperative that you return to your primary care physician (or establish a relationship with a primary care physician if you do not have one) for your post hospital discharge needs so that they can reassess your need for medications and monitor your lab values.   Increase activity slowly   Complete by: As directed      Allergies as of 07/24/2019      Reactions   Gold-containing Drug Products Rash   Denies oral or airway involvement - occurred in the 1980s.       Medication List    STOP taking these medications   aspirin 325 MG EC tablet   HYDROcodone-acetaminophen 5-325 MG tablet Commonly known as: NORCO/VICODIN   HYDROcodone-homatropine 5-1.5 MG/5ML syrup Commonly known as: HYCODAN   methocarbamol 500 MG tablet Commonly known as: ROBAXIN   traMADol 50 MG tablet Commonly known as: ULTRAM     TAKE these medications   acetaminophen 500 MG tablet Commonly known as: TYLENOL Take 1,000 mg by mouth every 8 (eight) hours as needed for mild pain or moderate pain.   amLODipine 5 MG tablet Commonly known as: NORVASC Take 1 tablet (5 mg total) by mouth daily. Start taking on: July 25, 2019   apixaban 5 MG Tabs tablet Commonly known as: ELIQUIS Take 2 tablets (10 mg total) by mouth 2 (two) times daily.   apixaban 5 MG Tabs tablet Commonly known as: ELIQUIS Take 1 tablet (5 mg total) by  mouth 2 (two) times daily. Start taking on: July 30, 2019   cholecalciferol 1000 units tablet Commonly known as: VITAMIN D Take 2,000 Units by mouth daily.   CoQ10 100 MG Caps Take 100 mg by mouth daily.   Enbrel 50 MG/ML injection Generic drug: etanercept Inject 0.98 mLs (50 mg total) into the skin once a week. Inject 1 syringe into the skin once a week. What changed: when to take this   Fish Oil Ultra 1400 MG Caps  Take 1,400 mg by mouth daily.   levocetirizine 5 MG tablet Commonly known as: XYZAL Take 5 mg by mouth daily.   Mag-200 200 MG Tabs Generic drug: Magnesium Oxide Take 200 mg by mouth daily.   Melatonin 5 MG Caps Take 5 mg by mouth at bedtime.   omeprazole 20 MG capsule Commonly known as: PRILOSEC Take 20 mg by mouth daily.   TURMERIC PO Take 15 mLs by mouth daily.       Allergies  Allergen Reactions  . Gold-Containing Drug Products Rash    Denies oral or airway involvement - occurred in the 1980s.      Other Procedures/Studies: CT Angio Chest PE W and/or Wo Contrast  Result Date: 07/19/2019 CLINICAL DATA:  High probability for pulmonary embolism. Left calf pain. COVID-19 diagnosis 2 weeks ago. Dehydration. EXAM: CT ANGIOGRAPHY CHEST WITH CONTRAST TECHNIQUE: Multidetector CT imaging of the chest was performed using the standard protocol during bolus administration of intravenous contrast. Multiplanar CT image reconstructions and MIPs were obtained to evaluate the vascular anatomy. Automatic exposure control utilized. CONTRAST:  164mL OMNIPAQUE IOHEXOL 350 MG/ML SOLN COMPARISON:  None. FINDINGS: Cardiovascular: Occlusive thrombus in the right upper lobar arteries. Additional bilateral more peripheral pulmonary emboli including nonocclusive thrombus in the right medial lower lobar pulmonary artery. Pulmonary artery hypertension, the pulmonary trunk measuring 3.8 cm diameter. Borderline cardiomegaly with mild coronary calcification. Right ventricular to left  ventricular ratio 1.2, consistent with right heart strain. No pericardial fluid. Mediastinum/Nodes: Subcentimeter noncalcified benign-appearing mediastinal lymph nodes. Small hiatal hernia. A 6 x 2 x 2 cm 22 Hounsfield unit cyst is present in the right cardiophrenic angle or slightly increased in size from 4 x 2 x 2 cm on the July 30, 2011 study. 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 Trace bilateral pleural fluid. Central pulmonary vascular congestion without overt pulmonary edema. Subpleural atelectasis. Upper Abdomen: Benign-appearing water density right renal cortical cysts measuring 8 mm in the midpole and 35 mm in the upper pole. Mild fatty infiltration of the pancreas. Musculoskeletal: Moderate degenerative changes. Bridging anterior osteophytes in the lower thoracic spine. Review of the MIP images confirms the above findings. I discussed critical results by telephone at the time of interpretation on 07/19/2019 at 10:25 p.m. Eastern standard time with provider Isla Pence , who verbally acknowledged these results. IMPRESSION: Bilateral lobar and peripheral pulmonary arteries with right heart strain, the RV:LV ratio 1.2. Occlusive thrombus in the right upper lobar arteries. 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 Unenhanced CT chest recommended in 3 months. A benign pericardial recess cyst, slightly increased in size since 2013. No additional surveillance imaging is recommended. Borderline cardiomegaly with mild coronary calcification and pulmonary artery hypertension. Trace bilateral pleural fluid. Electronically Signed   By: Revonda Humphrey   On: 07/19/2019 22:34   DG Chest Port 1 View  Result Date: 07/19/2019 CLINICAL DATA:  Shortness of breath, COVID positive EXAM: PORTABLE CHEST 1 VIEW COMPARISON:  2012 FINDINGS: Low lung volumes. No consolidation or edema. No pleural effusion or pneumothorax cardiomediastinal  silhouette is likely within normal limits for portable technique. IMPRESSION: No acute process in the chest. Electronically Signed   By: Macy Mis M.D.   On: 07/19/2019 21:05   ECHOCARDIOGRAM COMPLETE  Result Date: 07/20/2019    ECHOCARDIOGRAM REPORT   Patient Name:   [name] Date of Exam: 07/20/2019 Medical Rec #:  [id number]  Height:       69.0 in Accession #:    [id number]     Weight:       236.3 lb Date of Birth:  [birth year]       BSA:          2.218 m Patient Age:    52 years       BP:           141/82 mmHg Patient Gender: M              HR:           72 bpm. Exam Location:  Inpatient Procedure: 2D Echo, Cardiac Doppler and Color Doppler Indications:    I26.02 Pulmonary embolus  History:        Patient has no prior history of Echocardiogram examinations.                 CV19.  Sonographer:    Merrie Roof RDCS Referring Phys: [id number] [place]  1. Left ventricular ejection fraction, by estimation, is 60 to 65%. The left ventricle has normal function. The left ventricle has no regional wall motion abnormalities. There is mild left ventricular hypertrophy. Left ventricular diastolic parameters were normal.  2. Right ventricular systolic function is normal. The right ventricular size is normal. Tricuspid regurgitation signal is inadequate for assessing PA pressure.  3. The mitral valve is grossly normal. Trivial mitral valve regurgitation.  4. The aortic valve is tricuspid. Aortic valve regurgitation is not visualized.  5. The inferior vena cava is normal in size with greater than 50% respiratory variability, suggesting right atrial pressure of 3 mmHg. FINDINGS  Left Ventricle: Left ventricular ejection fraction, by estimation, is 60 to 65%. The left ventricle has normal function. The left ventricle has no regional wall motion abnormalities. The left ventricular internal cavity size was normal in size. There is  mild left ventricular hypertrophy. Left ventricular diastolic  parameters were normal. Right Ventricle: The right ventricular size is normal. No increase in right ventricular wall thickness. Right ventricular systolic function is normal. Tricuspid regurgitation signal is inadequate for assessing PA pressure. Left Atrium: Left atrial size was normal in size. Right Atrium: Right atrial size was normal in size. Pericardium: There is no evidence of pericardial effusion. Mitral Valve: The mitral valve is grossly normal. Trivial mitral valve regurgitation. Tricuspid Valve: The tricuspid valve is grossly normal. Tricuspid valve regurgitation is trivial. Aortic Valve: The aortic valve is tricuspid. Aortic valve regurgitation is not visualized. Mild aortic valve annular calcification. Pulmonic Valve: The pulmonic valve was grossly normal. Pulmonic valve regurgitation is not visualized. Aorta: The aortic root is normal in size and structure. Venous: The inferior vena cava is normal in size with greater than 50% respiratory variability, suggesting right atrial pressure of 3 mmHg. IAS/Shunts: No atrial level shunt detected by color flow Doppler.  LEFT VENTRICLE PLAX 2D LVIDd:         4.30 cm      Diastology LVIDs:         2.90 cm      LV e' lateral:   15.20 cm/s LV PW:         1.20 cm      LV E/e' lateral: 6.3 LV IVS:        1.30 cm      LV e' medial:    9.46 cm/s LVOT diam:     2.10 cm      LV E/e' medial:  10.1  LV SV:         68 LV SV Index:   30 LVOT Area:     3.46 cm  LV Volumes (MOD) LV vol d, MOD A4C: 122.0 ml LV vol s, MOD A4C: 38.4 ml LV SV MOD A4C:     122.0 ml RIGHT VENTRICLE          IVC RV Basal diam:  4.20 cm  IVC diam: 2.30 cm RV Mid diam:    3.50 cm LEFT ATRIUM             Index       RIGHT ATRIUM           Index LA diam:        3.50 cm 1.58 cm/m  RA Area:     18.50 cm LA Vol (A2C):   56.5 ml 25.48 ml/m RA Volume:   50.70 ml  22.86 ml/m LA Vol (A4C):   40.3 ml 18.17 ml/m LA Biplane Vol: 48.7 ml 21.96 ml/m  AORTIC VALVE LVOT Vmax:   88.70 cm/s LVOT Vmean:  61.300 cm/s  LVOT VTI:    0.195 m  AORTA Ao Root diam: 2.90 cm Ao Asc diam:  3.20 cm MITRAL VALVE MV Area (PHT): 4.31 cm    SHUNTS MV Decel Time: 176 msec    Systemic VTI:  0.20 m MV E velocity: 95.10 cm/s  Systemic Diam: 2.10 cm MV A velocity: 68.40 cm/s MV E/A ratio:  1.39 Rozann Lesches MD Electronically signed by Rozann Lesches MD Signature Date/Time: 07/20/2019/2:59:52 PM    Final    CT VENOGRAM ABD/PEL  Result Date: 07/22/2019 CLINICAL DATA:  Symptomatic DVT of the left lower extremity with duplex ultrasound demonstrating thrombus in the femoral vein, popliteal vein and calf veins. Assessment for additional potential iliac vein thrombus. EXAM: CT VENOGRAM ABD-PELVIS TECHNIQUE: Multidetector CT imaging of the abdomen and pelvis was performed using the standard protocol during bolus administration of intravenous contrast. Multiplanar reconstructed images and MIPs were obtained and reviewed to evaluate the vascular anatomy. Imaging was timed for venous evaluation of the abdomen and pelvis. CONTRAST:  159mL OMNIPAQUE IOHEXOL 350 MG/ML SOLN COMPARISON:  Results from a lower extremity venous duplex study on 07/20/2019 FINDINGS: VASCULAR Veins: The IVC and bilateral iliac veins are normally patent without evidence of thrombus, stenosis or anatomic variant. Common femoral veins are also normally patent bilaterally. No evidence of extrinsic compression of venous structures. Other venous structures including the renal veins, portal vein, splenic vein and mesenteric veins demonstrate normal patency. Review of the MIP images confirms the above findings. NON-VASCULAR Lower chest: Patchy peripheral opacities in the left lower lobe. Stable pericardial cyst at the right cardiophrenic sulcus. Hepatobiliary: No focal liver abnormality is seen. No gallstones, gallbladder wall thickening, or biliary dilatation. Pancreas: Unremarkable. No pancreatic ductal dilatation or surrounding inflammatory changes. Spleen: Normal in size without  focal abnormality. Adrenals/Urinary Tract: Adrenal glands are unremarkable. Kidneys are normal, without renal calculi, focal lesion, or hydronephrosis. Bladder is unremarkable. Stomach/Bowel: Visualized bowel demonstrates no evidence of obstruction, inflammation or lesion. No free air identified. Lymphatic: No enlarged lymph nodes identified in the abdomen or pelvis. Reproductive: Prostate is unremarkable. Other: No abdominal wall hernia or abnormality. No abdominopelvic ascites. Musculoskeletal: No acute or significant osseous findings. IMPRESSION: 1. Normal CT venogram of the abdomen and pelvis. No evidence of deep venous thrombosis involving the IVC, bilateral iliac veins or bilateral common femoral veins. 2. Patchy peripheral opacities in the left lower lobe. 3. Stable pericardial  cyst at the right cardiophrenic sulcus. 4. No acute findings in the abdomen or pelvis. Electronically Signed   By: Aletta Edouard M.D.   On: 07/22/2019 08:11   VAS [place] LOWER EXTREMITY VENOUS (DVT)  Result Date: 07/21/2019  Lower Venous DVTStudy Indications: Covid-19,, Swelling, and Pain.  Comparison Study: No prior LLE venous study on file Performing Technologist: Sharion Dove RVS  Examination Guidelines: A complete evaluation includes B-mode imaging, spectral Doppler, color Doppler, and power Doppler as needed of all accessible portions of each vessel. Bilateral testing is considered an integral part of a complete examination. Limited examinations for reoccurring indications may be performed as noted. The reflux portion of the exam is performed with the patient in reverse Trendelenburg.  +-----+---------------+---------+-----------+----------+--------------+ RIGHTCompressibilityPhasicitySpontaneityPropertiesThrombus Aging +-----+---------------+---------+-----------+----------+--------------+ CFV  Full           Yes      Yes                                  +-----+---------------+---------+-----------+----------+--------------+   +---------+---------------+---------+-----------+----------+--------------+ LEFT     CompressibilityPhasicitySpontaneityPropertiesThrombus Aging +---------+---------------+---------+-----------+----------+--------------+ CFV      Full           Yes      Yes                                 +---------+---------------+---------+-----------+----------+--------------+ SFJ      Full                                                        +---------+---------------+---------+-----------+----------+--------------+ FV Prox  Partial        Yes      Yes                  Acute          +---------+---------------+---------+-----------+----------+--------------+ FV Mid   None                                         Acute          +---------+---------------+---------+-----------+----------+--------------+ FV DistalNone                                         Acute          +---------+---------------+---------+-----------+----------+--------------+ PFV      Full           Yes      Yes                  Acute          +---------+---------------+---------+-----------+----------+--------------+ POP      Partial        No       No                   Acute          +---------+---------------+---------+-----------+----------+--------------+ PTV      None  Acute          +---------+---------------+---------+-----------+----------+--------------+ PERO     None                                         Acute          +---------+---------------+---------+-----------+----------+--------------+     Summary: RIGHT: - No evidence of common femoral vein obstruction.  LEFT: - Findings consistent with acute deep vein thrombosis involving the left femoral vein, left popliteal vein, left posterior tibial veins, and left peroneal veins.  *See table(s) above for measurements  and observations. Electronically signed by Ruta Hinds MD on 07/21/2019 at 12:32:04 PM.    Final      TODAY-DAY OF DISCHARGE:  Subjective:   Pepper Ertman today has no headache,no chest abdominal pain,no new weakness tingling or numbness, feels much better wants to go home today.   Objective:   Blood pressure 134/83, pulse (!) 55, temperature 98 F (36.7 C), temperature source Oral, resp. rate 18, height 5\' 9"  (1.753 m), weight 110.7 kg, SpO2 93 %.  Intake/Output Summary (Last 24 hours) at 07/24/2019 1002 Last data filed at 07/24/2019 0941 Gross per 24 hour  Intake 1297.03 ml  Output 1150 ml  Net 147.03 ml   Filed Weights   07/19/19 2057 07/20/19 0546 07/20/19 2100  Weight: 107.5 kg 107.2 kg 110.7 kg    Exam: Awake Alert, Oriented *3, No new F.N deficits, Normal affect [place].AT,PERRAL Supple Neck,No JVD, No cervical lymphadenopathy appriciated.  Symmetrical Chest wall movement, Good air movement bilaterally, CTAB RRR,No Gallops,Rubs or new Murmurs, No Parasternal Heave +ve B.Sounds, Abd Soft, Non tender,  No rebound    PERTINENT RADIOLOGIC STUDIES: CT Angio Chest PE W and/or Wo Contrast  Result Date: 07/19/2019 CLINICAL DATA:  High probability for pulmonary embolism. Left calf pain. COVID-19 diagnosis 2 weeks ago. Dehydration. EXAM: CT ANGIOGRAPHY CHEST WITH CONTRAST TECHNIQUE: Multidetector CT imaging of the chest was performed using the standard protocol during bolus administration of intravenous contrast. Multiplanar CT image reconstructions and MIPs were obtained to evaluate the vascular anatomy. Automatic exposure control utilized. CONTRAST:  146mL OMNIPAQUE IOHEXOL 350 MG/ML SOLN COMPARISON:  None. FINDINGS: Cardiovascular: Occlusive thrombus in the right upper lobar arteries. Additional bilateral more peripheral pulmonary emboli including nonocclusive thrombus in the right medial lower lobar pulmonary artery. Pulmonary artery hypertension, the pulmonary trunk measuring 3.8  cm diameter. Borderline cardiomegaly with mild coronary calcification. Right ventricular to left ventricular ratio 1.2, consistent with right heart strain. No pericardial fluid. Mediastinum/Nodes: Subcentimeter noncalcified benign-appearing mediastinal lymph nodes. Small hiatal hernia. A 6 x 2 x 2 cm 22 Hounsfield unit cyst is present in the right cardiophrenic angle or slightly increased in size from 4 x 2 x 2 cm on the July 30, 2011 study. 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 Trace bilateral pleural fluid. Central pulmonary vascular congestion without overt pulmonary edema. Subpleural atelectasis. Upper Abdomen: Benign-appearing water density right renal cortical cysts measuring 8 mm in the midpole and 35 mm in the upper pole. Mild fatty infiltration of the pancreas. Musculoskeletal: Moderate degenerative changes. Bridging anterior osteophytes in the lower thoracic spine. Review of the MIP images confirms the above findings. I discussed critical results by telephone at the time of interpretation on 07/19/2019 at 10:25 p.m. Eastern standard time with provider Isla Pence , who verbally acknowledged these results. IMPRESSION: Bilateral  lobar and peripheral pulmonary arteries with right heart strain, the RV:LV ratio 1.2. Occlusive thrombus in the right upper lobar arteries. 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 Unenhanced CT chest recommended in 3 months. A benign pericardial recess cyst, slightly increased in size since 2013. No additional surveillance imaging is recommended. Borderline cardiomegaly with mild coronary calcification and pulmonary artery hypertension. Trace bilateral pleural fluid. Electronically Signed   By: Revonda Humphrey   On: 07/19/2019 22:34   DG Chest Port 1 View  Result Date: 07/19/2019 CLINICAL DATA:  Shortness of breath, COVID positive EXAM: PORTABLE CHEST 1 VIEW COMPARISON:  2012 FINDINGS: Low  lung volumes. No consolidation or edema. No pleural effusion or pneumothorax cardiomediastinal silhouette is likely within normal limits for portable technique. IMPRESSION: No acute process in the chest. Electronically Signed   By: Macy Mis M.D.   On: 07/19/2019 21:05   ECHOCARDIOGRAM COMPLETE  Result Date: 07/20/2019    ECHOCARDIOGRAM REPORT   Patient Name:   [name] Date of Exam: 07/20/2019 Medical Rec #:  [id number]      Height:       69.0 in Accession #:    [id number]     Weight:       236.3 lb Date of Birth:  [birth year]       BSA:          2.218 m Patient Age:    67 years       BP:           141/82 mmHg Patient Gender: M              HR:           72 bpm. Exam Location:  Inpatient Procedure: 2D Echo, Cardiac Doppler and Color Doppler Indications:    I26.02 Pulmonary embolus  History:        Patient has no prior history of Echocardiogram examinations.                 CV19.  Sonographer:    Merrie Roof RDCS Referring Phys: [id number] [place]  1. Left ventricular ejection fraction, by estimation, is 60 to 65%. The left ventricle has normal function. The left ventricle has no regional wall motion abnormalities. There is mild left ventricular hypertrophy. Left ventricular diastolic parameters were normal.  2. Right ventricular systolic function is normal. The right ventricular size is normal. Tricuspid regurgitation signal is inadequate for assessing PA pressure.  3. The mitral valve is grossly normal. Trivial mitral valve regurgitation.  4. The aortic valve is tricuspid. Aortic valve regurgitation is not visualized.  5. The inferior vena cava is normal in size with greater than 50% respiratory variability, suggesting right atrial pressure of 3 mmHg. FINDINGS  Left Ventricle: Left ventricular ejection fraction, by estimation, is 60 to 65%. The left ventricle has normal function. The left ventricle has no regional wall motion abnormalities. The left ventricular internal cavity size was  normal in size. There is  mild left ventricular hypertrophy. Left ventricular diastolic parameters were normal. Right Ventricle: The right ventricular size is normal. No increase in right ventricular wall thickness. Right ventricular systolic function is normal. Tricuspid regurgitation signal is inadequate for assessing PA pressure. Left Atrium: Left atrial size was normal in size. Right Atrium: Right atrial size was normal in size. Pericardium: There is no evidence of pericardial effusion. Mitral Valve: The mitral valve is grossly normal. Trivial mitral valve regurgitation. Tricuspid Valve:  The tricuspid valve is grossly normal. Tricuspid valve regurgitation is trivial. Aortic Valve: The aortic valve is tricuspid. Aortic valve regurgitation is not visualized. Mild aortic valve annular calcification. Pulmonic Valve: The pulmonic valve was grossly normal. Pulmonic valve regurgitation is not visualized. Aorta: The aortic root is normal in size and structure. Venous: The inferior vena cava is normal in size with greater than 50% respiratory variability, suggesting right atrial pressure of 3 mmHg. IAS/Shunts: No atrial level shunt detected by color flow Doppler.  LEFT VENTRICLE PLAX 2D LVIDd:         4.30 cm      Diastology LVIDs:         2.90 cm      LV e' lateral:   15.20 cm/s LV PW:         1.20 cm      LV E/e' lateral: 6.3 LV IVS:        1.30 cm      LV e' medial:    9.46 cm/s LVOT diam:     2.10 cm      LV E/e' medial:  10.1 LV SV:         68 LV SV Index:   30 LVOT Area:     3.46 cm  LV Volumes (MOD) LV vol d, MOD A4C: 122.0 ml LV vol s, MOD A4C: 38.4 ml LV SV MOD A4C:     122.0 ml RIGHT VENTRICLE          IVC RV Basal diam:  4.20 cm  IVC diam: 2.30 cm RV Mid diam:    3.50 cm LEFT ATRIUM             Index       RIGHT ATRIUM           Index LA diam:        3.50 cm 1.58 cm/m  RA Area:     18.50 cm LA Vol (A2C):   56.5 ml 25.48 ml/m RA Volume:   50.70 ml  22.86 ml/m LA Vol (A4C):   40.3 ml 18.17 ml/m LA Biplane  Vol: 48.7 ml 21.96 ml/m  AORTIC VALVE LVOT Vmax:   88.70 cm/s LVOT Vmean:  61.300 cm/s LVOT VTI:    0.195 m  AORTA Ao Root diam: 2.90 cm Ao Asc diam:  3.20 cm MITRAL VALVE MV Area (PHT): 4.31 cm    SHUNTS MV Decel Time: 176 msec    Systemic VTI:  0.20 m MV E velocity: 95.10 cm/s  Systemic Diam: 2.10 cm MV A velocity: 68.40 cm/s MV E/A ratio:  1.39 Rozann Lesches MD Electronically signed by Rozann Lesches MD Signature Date/Time: 07/20/2019/2:59:52 PM    Final    CT VENOGRAM ABD/PEL  Result Date: 07/22/2019 CLINICAL DATA:  Symptomatic DVT of the left lower extremity with duplex ultrasound demonstrating thrombus in the femoral vein, popliteal vein and calf veins. Assessment for additional potential iliac vein thrombus. EXAM: CT VENOGRAM ABD-PELVIS TECHNIQUE: Multidetector CT imaging of the abdomen and pelvis was performed using the standard protocol during bolus administration of intravenous contrast. Multiplanar reconstructed images and MIPs were obtained and reviewed to evaluate the vascular anatomy. Imaging was timed for venous evaluation of the abdomen and pelvis. CONTRAST:  164mL OMNIPAQUE IOHEXOL 350 MG/ML SOLN COMPARISON:  Results from a lower extremity venous duplex study on 07/20/2019 FINDINGS: VASCULAR Veins: The IVC and bilateral iliac veins are normally patent without evidence of thrombus, stenosis or anatomic variant. Common femoral veins are also normally  patent bilaterally. No evidence of extrinsic compression of venous structures. Other venous structures including the renal veins, portal vein, splenic vein and mesenteric veins demonstrate normal patency. Review of the MIP images confirms the above findings. NON-VASCULAR Lower chest: Patchy peripheral opacities in the left lower lobe. Stable pericardial cyst at the right cardiophrenic sulcus. Hepatobiliary: No focal liver abnormality is seen. No gallstones, gallbladder wall thickening, or biliary dilatation. Pancreas: Unremarkable. No pancreatic  ductal dilatation or surrounding inflammatory changes. Spleen: Normal in size without focal abnormality. Adrenals/Urinary Tract: Adrenal glands are unremarkable. Kidneys are normal, without renal calculi, focal lesion, or hydronephrosis. Bladder is unremarkable. Stomach/Bowel: Visualized bowel demonstrates no evidence of obstruction, inflammation or lesion. No free air identified. Lymphatic: No enlarged lymph nodes identified in the abdomen or pelvis. Reproductive: Prostate is unremarkable. Other: No abdominal wall hernia or abnormality. No abdominopelvic ascites. Musculoskeletal: No acute or significant osseous findings. IMPRESSION: 1. Normal CT venogram of the abdomen and pelvis. No evidence of deep venous thrombosis involving the IVC, bilateral iliac veins or bilateral common femoral veins. 2. Patchy peripheral opacities in the left lower lobe. 3. Stable pericardial cyst at the right cardiophrenic sulcus. 4. No acute findings in the abdomen or pelvis. Electronically Signed   By: Aletta Edouard M.D.   On: 07/22/2019 08:11   VAS [place] LOWER EXTREMITY VENOUS (DVT)  Result Date: 07/21/2019  Lower Venous DVTStudy Indications: Covid-19,, Swelling, and Pain.  Comparison Study: No prior LLE venous study on file Performing Technologist: Sharion Dove RVS  Examination Guidelines: A complete evaluation includes B-mode imaging, spectral Doppler, color Doppler, and power Doppler as needed of all accessible portions of each vessel. Bilateral testing is considered an integral part of a complete examination. Limited examinations for reoccurring indications may be performed as noted. The reflux portion of the exam is performed with the patient in reverse Trendelenburg.  +-----+---------------+---------+-----------+----------+--------------+ RIGHTCompressibilityPhasicitySpontaneityPropertiesThrombus Aging +-----+---------------+---------+-----------+----------+--------------+ CFV  Full           Yes      Yes                                  +-----+---------------+---------+-----------+----------+--------------+   +---------+---------------+---------+-----------+----------+--------------+ LEFT     CompressibilityPhasicitySpontaneityPropertiesThrombus Aging +---------+---------------+---------+-----------+----------+--------------+ CFV      Full           Yes      Yes                                 +---------+---------------+---------+-----------+----------+--------------+ SFJ      Full                                                        +---------+---------------+---------+-----------+----------+--------------+ FV Prox  Partial        Yes      Yes                  Acute          +---------+---------------+---------+-----------+----------+--------------+ FV Mid   None                                         Acute          +---------+---------------+---------+-----------+----------+--------------+  FV DistalNone                                         Acute          +---------+---------------+---------+-----------+----------+--------------+ PFV      Full           Yes      Yes                  Acute          +---------+---------------+---------+-----------+----------+--------------+ POP      Partial        No       No                   Acute          +---------+---------------+---------+-----------+----------+--------------+ PTV      None                                         Acute          +---------+---------------+---------+-----------+----------+--------------+ PERO     None                                         Acute          +---------+---------------+---------+-----------+----------+--------------+     Summary: RIGHT: - No evidence of common femoral vein obstruction.  LEFT: - Findings consistent with acute deep vein thrombosis involving the left femoral vein, left popliteal vein, left posterior tibial veins, and left peroneal veins.  *See table(s)  above for measurements and observations. Electronically signed by Ruta Hinds MD on 07/21/2019 at 12:32:04 PM.    Final      PERTINENT LAB RESULTS: CBC: Recent Labs    07/23/19 0228 07/24/19 0419  WBC 7.2 6.8  HGB 13.7 13.8  HCT 41.0 41.1  PLT 165 165   CMET CMP     Component Value Date/Time   NA 137 07/24/2019 0419   K 3.9 07/24/2019 0419   CL 105 07/24/2019 0419   CO2 22 07/24/2019 0419   GLUCOSE 94 07/24/2019 0419   BUN 21 (H) 07/24/2019 0419   CREATININE 1.14 07/24/2019 0419   CREATININE 0.91 05/28/2019 1043   CALCIUM 8.7 (L) 07/24/2019 0419   PROT 6.2 (L) 07/24/2019 0419   ALBUMIN 3.2 (L) 07/24/2019 0419   AST 27 07/24/2019 0419   ALT 48 (H) 07/24/2019 0419   ALKPHOS 55 07/24/2019 0419   BILITOT 0.7 07/24/2019 0419   GFRNONAA >60 07/24/2019 0419   GFRNONAA 95 05/28/2019 1043   GFRAA >60 07/24/2019 0419   GFRAA 110 05/28/2019 1043    GFR Estimated Creatinine Clearance: 89.8 mL/min (by C-G formula based on SCr of 1.14 mg/dL). No results for input(s): LIPASE, AMYLASE in the last 72 hours. No results for input(s): CKTOTAL, CKMB, CKMBINDEX, TROPONINI in the last 72 hours. Invalid input(s): POCBNP Recent Labs    07/23/19 0228 07/24/19 0419  DDIMER 2.62* 1.95*   No results for input(s): HGBA1C in the last 72 hours. No results for input(s): CHOL, HDL, LDLCALC, TRIG, CHOLHDL, LDLDIRECT in the last 72 hours. Recent Labs    07/23/19 0228  TSH 1.493   Recent Labs  07/23/19 0228 07/24/19 0419  FERRITIN 158 137   Coags: No results for input(s): INR in the last 72 hours.  Invalid input(s): PT Microbiology: Recent Results (from the past 240 hour(s))  Blood Culture (routine x 2)     Status: None   Collection Time: 07/19/19  8:22 PM   Specimen: BLOOD  Result Value Ref Range Status   Specimen Description   Final    BLOOD RIGHT ANTECUBITAL Performed at [facility], [place] [address], [place], [place] [address]    Special Requests   Final     BOTTLES DRAWN AEROBIC AND ANAEROBIC Blood Culture results may not be optimal due to an excessive volume of blood received in culture bottles Performed at [facility] [address] [place], [place] [address]    Culture   Final    NO GROWTH 5 DAYS Performed at [facility], [place] [address] [place], [place] [address]    Report Status 07/24/2019 FINAL  Final  Blood Culture (routine x 2)     Status: None (Preliminary result)   Collection Time: 07/19/19  8:27 PM   Specimen: BLOOD  Result Value Ref Range Status   Specimen Description   Final    BLOOD RIGHT HAND Performed at [facility] [address], [place], [place] [address]    Special Requests   Final    BOTTLES DRAWN AEROBIC AND ANAEROBIC Blood Culture results may not be optimal due to an excessive volume of blood received in culture bottles Performed at [facility] [address], [place], [place] [address]    Culture   Final    NO GROWTH 4 DAYS Performed at [facility], [place] [address], [place], [place] [address]    Report Status PENDING  Incomplete  MRSA PCR Screening     Status: None   Collection Time: 07/20/19  8:48 PM   Specimen: Nasopharyngeal  Result Value Ref Range Status   MRSA by PCR NEGATIVE NEGATIVE Final    Comment:        The GeneXpert MRSA Assay (FDA approved for NASAL specimens only), is one component of a comprehensive MRSA colonization surveillance program. It is not intended to diagnose MRSA infection nor to guide or monitor treatment for MRSA infections. Performed at [facility], [place] [address], [place],  [address]     FURTHER DISCHARGE INSTRUCTIONS:  Get Medicines reviewed and adjusted: Please take all your medications with you for your next visit with your Primary MD  Laboratory/radiological data: Please request your Primary MD to go over all hospital tests and procedure/radiological results at  the follow up, please ask your Primary MD to get all Hospital records sent to his/her office.  In some cases, they will be blood work, cultures and biopsy results pending at the time of your discharge. Please request that your primary care M.D. goes through all the records of your hospital data and follows up on these results.  Also Note the following: If you experience worsening of your admission symptoms, develop shortness of breath, life threatening emergency, suicidal or homicidal thoughts you must seek medical attention immediately by calling 911 or calling your MD immediately  if symptoms less severe.  You must read complete instructions/literature along with all the possible adverse reactions/side effects for all the Medicines you take and that have been prescribed to you. Take any new Medicines after you have completely understood and accpet all the possible  adverse reactions/side effects.   Do not drive when taking Pain medications or sleeping medications (Benzodaizepines)  Do not take more than prescribed Pain, Sleep and Anxiety Medications. It is not advisable to combine anxiety,sleep and pain medications without talking with your primary care practitioner  Special Instructions: If you have smoked or chewed Tobacco  in the last 2 yrs please stop smoking, stop any regular Alcohol  and or any Recreational drug use.  Wear Seat belts while driving.  Please note: You were cared for by a hospitalist during your hospital stay. Once you are discharged, your primary care physician will handle any further medical issues. Please note that NO REFILLS for any discharge medications will be authorized once you are discharged, as it is imperative that you return to your primary care physician (or establish a relationship with a primary care physician if you do not have one) for your post hospital discharge needs so that they can reassess your need for medications and monitor your lab values.  Total  Time spent coordinating discharge including counseling, education and face to face time equals 35 minutes.  SignedOren Binet 07/24/2019 10:02 AM

## 2019-07-25 ENCOUNTER — Encounter: Payer: Self-pay | Admitting: Family Medicine

## 2019-07-25 ENCOUNTER — Telehealth: Payer: Self-pay | Admitting: *Deleted

## 2019-07-25 LAB — CULTURE, BLOOD (ROUTINE X 2): Culture: NO GROWTH

## 2019-07-25 NOTE — Telephone Encounter (Signed)
Spoke with patient to schedule a follow up hospital visit.  Patient was positive for COVID 07/10/19.  He still has a "lingering cough".  A virtual visit was offered, but the patient declined.  Patient would like to be seen in office.  Please advise.

## 2019-07-25 NOTE — Telephone Encounter (Signed)
Transition Care Management Follow-up Telephone Call   Date discharged? 07/24/2019    How have you been since you were released from the hospital? "Real good"     Do you understand why you were in the hospital? yes   Do you understand the discharge instructions? yes   Where were you discharged to? Home    Items Reviewed:  Medications reviewed: yes  Allergies reviewed: yes  Dietary changes reviewed: N/A   Referrals reviewed: N/A    Functional Questionnaire:   Activities of Daily Living (ADLs):   He states they are independent in the following: ambulation, bathing and hygiene, feeding, continence, grooming, toileting and dressing States they require assistance with the following: N/A    Any transportation issues/concerns?: no   Any patient concerns? no   Confirmed importance and date/time of follow-up visits scheduled yes  Provider Appointment booked with Dr. [name] 08/05/2019.   Confirmed with patient if condition begins to worsen call PCP or go to the ER.  Patient was given the office number and encouraged to call back with question or concerns.  : yes

## 2019-07-25 NOTE — Telephone Encounter (Signed)
From the way I am reading CDC guidelines quarantine should be at least 20 days for immunocompromised (he is on Enbrel) provided also > 24 hours from fever resolution and improving cough.  They don't state resolution of cough.  I think he is past the 20 day mark since onset.  I know this is very challenging.   I am OK with him going to the follow up clinic you mentioned but don't want to turn our patients away when they are meeting CDC standards for quarantine.

## 2019-07-26 ENCOUNTER — Encounter: Payer: Self-pay | Admitting: *Deleted

## 2019-07-26 ENCOUNTER — Other Ambulatory Visit: Payer: Self-pay | Admitting: *Deleted

## 2019-07-26 NOTE — Telephone Encounter (Signed)
Spoke with patient. Informed if he still has the cough he will need to go the [facility]. Provided him with information. Patient verbalized understanding.

## 2019-07-26 NOTE — Patient Outreach (Signed)
[facility]) Care Management  07/26/2019  [name] 25-Dec-1965 [id number]   Transition of care call/case closure   Referral received:07/22/19 Initial outreach:07/26/19 Insurance: [employer]    Subjective: Initial successful telephone call to patient's preferred number in order to complete transition of care assessment; 2 HIPAA identifiers verified. Explained purpose of call and completed transition of care assessment.  [name] states he is ding better, states he still has a cough, non productive. He denies having shortness of breath or chest discomfort at this time, report recent oxygen saturation was 96%. Patient denies having symptoms of fever discussed monitoring it at home. He states tolerating diet, denies bowel or bladder problems. He reports being independent with care, wife available to assist if needed.  He discussed not taking Norvasc as on discharge instructions, he states that he understands that he had a few elevations while in the hospital , and he discussed this with  doctor at discharge he would not take it , he plans to  discuss at visit with PCP. He states that he will get his wife to help with checking his blood pressure at home.     States they do have  hospital  Indemnity plan and wife understands to file a claim  He uses a [facility] outpatient pharmacy, at [place] with patient instruction on quarantine after discharge due to Covid 19 positive, of 3 weeks of isolation from 07/10/19  he voiced understanding.    Objective:  Mr. Feezor was hospitalized at [facility] from 3/19-3/24/21 for Pulmonary embolism, Covid 19, Left leg DVT.  Comorbidities include: Hypertension, Psoriatic arthritis  He was discharged to home on 3/24/21without the need for home health services or DME.   Assessment:  Patient voices good understanding of all discharge instructions.  See transition of care flowsheet for assessment details.   Plan:   Reviewed hospital discharge diagnosis of Pulmonary embolism, Left leg DVT, Covid 19 infection.  and discharge treatment plan using hospital discharge instructions, assessing medication adherence, reviewing problems requiring provider notification, and discussing the importance of follow up with primary care provider  as directed. Reinforced notifying MD of increased shortness of breath, pain, fever. Reinforced notifying MD, seeking immediate  Medical attention if noting blood in stools , review eliquis used to treat and prevent blood clots and importance of seeking medical attention of concerns of bleeding.   No ongoing care management needs identified so will close case to [place] [facility] services and route successful outreach letter with [facility] Management pamphlet and 24 Hour Nurse Line Magnet to [facility] clinical pool to be mailed to patient's home address.    [name], RN, BSN  [facility] Management Coordinator  [phone]- Mobile [phone]- Toll Free Main Office

## 2019-07-30 ENCOUNTER — Ambulatory Visit (INDEPENDENT_AMBULATORY_CARE_PROVIDER_SITE_OTHER): Payer: 59 | Admitting: Nurse Practitioner

## 2019-07-30 ENCOUNTER — Other Ambulatory Visit: Payer: Self-pay

## 2019-07-30 VITALS — BP 138/86 | HR 70 | Temp 97.3°F | Ht 69.0 in | Wt 237.5 lb

## 2019-07-30 DIAGNOSIS — Z8616 Personal history of COVID-19: Secondary | ICD-10-CM

## 2019-07-30 DIAGNOSIS — R05 Cough: Secondary | ICD-10-CM | POA: Diagnosis not present

## 2019-07-30 DIAGNOSIS — I82402 Acute embolism and thrombosis of unspecified deep veins of left lower extremity: Secondary | ICD-10-CM

## 2019-07-30 DIAGNOSIS — I2699 Other pulmonary embolism without acute cor pulmonale: Secondary | ICD-10-CM

## 2019-07-30 DIAGNOSIS — R9389 Abnormal findings on diagnostic imaging of other specified body structures: Secondary | ICD-10-CM

## 2019-07-30 DIAGNOSIS — R059 Cough, unspecified: Secondary | ICD-10-CM | POA: Insufficient documentation

## 2019-07-30 MED ORDER — APIXABAN 5 MG PO TABS: 5.0000 mg | ORAL_TABLET | Freq: Two times a day (BID) | ORAL | 0 refills | Status: DC

## 2019-07-30 MED ORDER — BENZONATATE 100 MG PO CAPS: 100.0000 mg | ORAL_CAPSULE | Freq: Two times a day (BID) | ORAL | 0 refills | Status: DC | PRN

## 2019-07-30 MED FILL — ELIQUIS 5 MG TABLET: 5 | 30 days supply | Qty: 60 | Fill #0

## 2019-07-30 MED FILL — BENZONATATE 100 MG CAPS: 100 | 10 days supply | Qty: 20 | Fill #0

## 2019-07-30 NOTE — Progress Notes (Signed)
@Patient  ID: [name], male    DOB: June 29, 1965, 54 y.o.   MRN: [id number]  Chief Complaint  Patient presents with  . Post COVID    Tested postive on 3/10 was admitted from 3/19-3/24. Only sx now is a cough.    Referring provider: Eulas Post, MD   54 year old male with past medical history of psoriatic arthritis.  Diagnosed with Covid on 07/10/2019.   Recent Significant Encounters:   3/19>> admit to [facility] for large PE with CT evidence of RV strain and LLE DVT - teated with remdesivir and decadron. DVT prophylaxis - IV heparin>>Eliquis [id number] RV systolic function. EF 60-65% 3/21>> transferred to [facility] for IR evaluation for catheter guided lytic therapy   HPI   Patient presents today for post Covid care clinic visit.  Patient states that he has been doing well since hospital admission.  He is active.  Denies any shortness of breath.  He does complain of a lingering cough.  He does state that the cough is improving.  He states that his cough is nonproductive.  He has tried to follow-up with his PCP but was advised that he cannot come into the office due to recently having Covid and still having a cough.  Patient was diagnosed with DVT and PE.  He is compliant with Eliquis. Denies f/c/s, n/v/d, hemoptysis, PND, chest pain or edema.  Note: Patient was walked in office - O2 sats remained in the upper 90's for entire walk - heart rate stable.      Allergies  Allergen Reactions  . Gold-Containing Drug Products Rash    Denies oral or airway involvement - occurred in the 1980s.     Immunization History  Administered Date(s) Administered  . Influenza,inj,Quad PF,6+ Mos 02/03/2017, 03/21/2018  . Pneumococcal Conjugate-13 03/21/2018  . Tdap 09/22/2014  . Zoster Recombinat (Shingrix) 04/02/2018, 05/23/2018    Past Medical History:  Diagnosis Date  . Anemia   . History of avascular necrosis of capital femoral epiphysis 2006, 2007   Bilateral Femoral Head  . HLD  (hyperlipidemia)   . Pneumonia    twice  . Psoriatic arthritis ([place])     Tobacco History: Social History   Tobacco Use  Smoking Status Never Smoker  Smokeless Tobacco Never Used   Counseling given: Not Answered   Outpatient Encounter Medications as of 07/30/2019  Medication Sig  . acetaminophen (TYLENOL) 500 MG tablet Take 1,000 mg by mouth every 8 (eight) hours as needed for mild pain or moderate pain.   [name] apixaban (ELIQUIS) 5 MG TABS tablet Take 2 tablets (10 mg total) by mouth 2 (two) times daily.  [name] apixaban (ELIQUIS) 5 MG TABS tablet Take 1 tablet (5 mg total) by mouth 2 (two) times daily.  . cholecalciferol (VITAMIN D) 1000 units tablet Take 2,000 Units by mouth daily.   [name] etanercept (ENBREL) 50 MG/ML injection Inject 0.98 mLs (50 mg total) into the skin once a week. Inject 1 syringe into the skin once a week. (Patient taking differently: Inject 50 mg into the skin every Sunday. Inject 1 syringe into the skin once a week.)  . levocetirizine (XYZAL) 5 MG tablet Take 5 mg by mouth daily.  . Magnesium Oxide (MAG-200) 200 MG TABS Take 200 mg by mouth daily.  . Melatonin 5 MG CAPS Take 5 mg by mouth at bedtime.   . Omega-3 Fatty Acids (FISH OIL ULTRA) 1400 MG CAPS Take 1,400 mg by mouth daily.  [name] omeprazole (PRILOSEC) 20 MG  capsule Take 20 mg by mouth daily.  . TURMERIC PO Take 15 mLs by mouth daily.   . [DISCONTINUED] apixaban (ELIQUIS) 5 MG TABS tablet Take 1 tablet (5 mg total) by mouth 2 (two) times daily.  [name] amLODipine (NORVASC) 5 MG tablet Take 1 tablet (5 mg total) by mouth daily. (Patient not taking: Reported on 07/26/2019)  . benzonatate (TESSALON) 100 MG capsule Take 1 capsule (100 mg total) by mouth 2 (two) times daily as needed for cough.  . Coenzyme Q10 (COQ10) 100 MG CAPS Take 100 mg by mouth daily.    No facility-administered encounter medications on file as of 07/30/2019.     Review of Systems  Review of Systems  Constitutional: Negative.  Negative for activity  change and fatigue.  HENT: Negative.   Respiratory: Positive for cough. Negative for shortness of breath and wheezing.   Cardiovascular: Negative.  Negative for chest pain and leg swelling.  Gastrointestinal: Negative.   Allergic/Immunologic: Negative.   Neurological: Negative.   Psychiatric/Behavioral: Negative.        Physical Exam  BP 138/86 (BP Location: Left Arm, Patient Position: Sitting, Cuff Size: Large)   Pulse 70   Temp (!) 97.3 F (36.3 C)   Ht 5\' 9"  (1.753 m)   Wt 237 lb 8 oz (107.7 kg)   SpO2 97%   BMI 35.07 kg/m   Wt Readings from Last 5 Encounters:  07/30/19 237 lb 8 oz (107.7 kg)  07/20/19 244 lb 0.8 oz (110.7 kg)  06/11/19 253 lb 6.4 oz (114.9 kg)  04/10/19 246 lb (111.6 kg)  04/09/19 246 lb (111.6 kg)     Physical Exam Vitals and nursing note reviewed.  Constitutional:      General: He is not in acute distress.    Appearance: He is well-developed.  Cardiovascular:     Rate and Rhythm: Normal rate and regular rhythm.  Pulmonary:     Effort: Pulmonary effort is normal. No respiratory distress.     Breath sounds: Normal breath sounds. No wheezing or rhonchi.  Musculoskeletal:     Right lower leg: No edema.     Left lower leg: No edema.  Skin:    General: Skin is warm and dry.  Neurological:     Mental Status: He is alert and oriented to person, place, and time.      Lab Results:  CBC    Component Value Date/Time   WBC 6.8 07/24/2019 0419   RBC 4.51 07/24/2019 0419   HGB 13.8 07/24/2019 0419   HCT 41.1 07/24/2019 0419   PLT 165 07/24/2019 0419   MCV 91.1 07/24/2019 0419   MCH 30.6 07/24/2019 0419   MCHC 33.6 07/24/2019 0419   RDW 12.7 07/24/2019 0419   LYMPHSABS 1.6 07/19/2019 2035   MONOABS 0.4 07/19/2019 2035   EOSABS 0.1 07/19/2019 2035   BASOSABS 0.0 07/19/2019 2035    BMET    Component Value Date/Time   NA 137 07/24/2019 0419   K 3.9 07/24/2019 0419   CL 105 07/24/2019 0419   CO2 22 07/24/2019 0419   GLUCOSE 94  07/24/2019 0419   BUN 21 (H) 07/24/2019 0419   CREATININE 1.14 07/24/2019 0419   CREATININE 0.91 05/28/2019 1043   CALCIUM 8.7 (L) 07/24/2019 0419   GFRNONAA >60 07/24/2019 0419   GFRNONAA 95 05/28/2019 1043   GFRAA >60 07/24/2019 0419   GFRAA 110 05/28/2019 1043    BNP    Component Value Date/Time   BNP 20.1 07/19/2019 2140  ProBNP No results found for: PROBNP  Imaging: CT Angio Chest PE W and/or Wo Contrast  Result Date: 07/19/2019 CLINICAL DATA:  High probability for pulmonary embolism. Left calf pain. COVID-19 diagnosis 2 weeks ago. Dehydration. EXAM: CT ANGIOGRAPHY CHEST WITH CONTRAST TECHNIQUE: Multidetector CT imaging of the chest was performed using the standard protocol during bolus administration of intravenous contrast. Multiplanar CT image reconstructions and MIPs were obtained to evaluate the vascular anatomy. Automatic exposure control utilized. CONTRAST:  117mL OMNIPAQUE IOHEXOL 350 MG/ML SOLN COMPARISON:  None. FINDINGS: Cardiovascular: Occlusive thrombus in the right upper lobar arteries. Additional bilateral more peripheral pulmonary emboli including nonocclusive thrombus in the right medial lower lobar pulmonary artery. Pulmonary artery hypertension, the pulmonary trunk measuring 3.8 cm diameter. Borderline cardiomegaly with mild coronary calcification. Right ventricular to left ventricular ratio 1.2, consistent with right heart strain. No pericardial fluid. Mediastinum/Nodes: Subcentimeter noncalcified benign-appearing mediastinal lymph nodes. Small hiatal hernia. A 6 x 2 x 2 cm 22 Hounsfield unit cyst is present in the right cardiophrenic angle or slightly increased in size from 4 x 2 x 2 cm on the July 30, 2011 study. 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 Trace bilateral pleural fluid. Central pulmonary vascular congestion without overt pulmonary edema. Subpleural atelectasis. Upper Abdomen: Benign-appearing water  density right renal cortical cysts measuring 8 mm in the midpole and 35 mm in the upper pole. Mild fatty infiltration of the pancreas. Musculoskeletal: Moderate degenerative changes. Bridging anterior osteophytes in the lower thoracic spine. Review of the MIP images confirms the above findings. I discussed critical results by telephone at the time of interpretation on 07/19/2019 at 10:25 p.m. Eastern standard time with provider Isla Pence , who verbally acknowledged these results. IMPRESSION: Bilateral lobar and peripheral pulmonary arteries with right heart strain, the RV:LV ratio 1.2. Occlusive thrombus in the right upper lobar arteries. 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 Unenhanced CT chest recommended in 3 months. A benign pericardial recess cyst, slightly increased in size since 2013. No additional surveillance imaging is recommended. Borderline cardiomegaly with mild coronary calcification and pulmonary artery hypertension. Trace bilateral pleural fluid. Electronically Signed   By: Revonda Humphrey   On: 07/19/2019 22:34   DG Chest Port 1 View  Result Date: 07/19/2019 CLINICAL DATA:  Shortness of breath, COVID positive EXAM: PORTABLE CHEST 1 VIEW COMPARISON:  2012 FINDINGS: Low lung volumes. No consolidation or edema. No pleural effusion or pneumothorax cardiomediastinal silhouette is likely within normal limits for portable technique. IMPRESSION: No acute process in the chest. Electronically Signed   By: Macy Mis M.D.   On: 07/19/2019 21:05   ECHOCARDIOGRAM COMPLETE  Result Date: 07/20/2019    ECHOCARDIOGRAM REPORT   Patient Name:   [name] Date of Exam: 07/20/2019 Medical Rec #:  [id number]      Height:       69.0 in Accession #:    [id number]     Weight:       236.3 lb Date of Birth:  11/27/1965       BSA:          2.218 m Patient Age:    44 years       BP:           141/82 mmHg Patient Gender: M              HR:           72  bpm. Exam Location:   Inpatient Procedure: 2D Echo, Cardiac Doppler and Color Doppler Indications:    I26.02 Pulmonary embolus  History:        Patient has no prior history of Echocardiogram examinations.                 CV19.  Sonographer:    Merrie Roof RDCS Referring Phys: [id number] [place]  1. Left ventricular ejection fraction, by estimation, is 60 to 65%. The left ventricle has normal function. The left ventricle has no regional wall motion abnormalities. There is mild left ventricular hypertrophy. Left ventricular diastolic parameters were normal.  2. Right ventricular systolic function is normal. The right ventricular size is normal. Tricuspid regurgitation signal is inadequate for assessing PA pressure.  3. The mitral valve is grossly normal. Trivial mitral valve regurgitation.  4. The aortic valve is tricuspid. Aortic valve regurgitation is not visualized.  5. The inferior vena cava is normal in size with greater than 50% respiratory variability, suggesting right atrial pressure of 3 mmHg. FINDINGS  Left Ventricle: Left ventricular ejection fraction, by estimation, is 60 to 65%. The left ventricle has normal function. The left ventricle has no regional wall motion abnormalities. The left ventricular internal cavity size was normal in size. There is  mild left ventricular hypertrophy. Left ventricular diastolic parameters were normal. Right Ventricle: The right ventricular size is normal. No increase in right ventricular wall thickness. Right ventricular systolic function is normal. Tricuspid regurgitation signal is inadequate for assessing PA pressure. Left Atrium: Left atrial size was normal in size. Right Atrium: Right atrial size was normal in size. Pericardium: There is no evidence of pericardial effusion. Mitral Valve: The mitral valve is grossly normal. Trivial mitral valve regurgitation. Tricuspid Valve: The tricuspid valve is grossly normal. Tricuspid valve regurgitation is trivial. Aortic Valve: The  aortic valve is tricuspid. Aortic valve regurgitation is not visualized. Mild aortic valve annular calcification. Pulmonic Valve: The pulmonic valve was grossly normal. Pulmonic valve regurgitation is not visualized. Aorta: The aortic root is normal in size and structure. Venous: The inferior vena cava is normal in size with greater than 50% respiratory variability, suggesting right atrial pressure of 3 mmHg. IAS/Shunts: No atrial level shunt detected by color flow Doppler.  LEFT VENTRICLE PLAX 2D LVIDd:         4.30 cm      Diastology LVIDs:         2.90 cm      LV e' lateral:   15.20 cm/s LV PW:         1.20 cm      LV E/e' lateral: 6.3 LV IVS:        1.30 cm      LV e' medial:    9.46 cm/s LVOT diam:     2.10 cm      LV E/e' medial:  10.1 LV SV:         68 LV SV Index:   30 LVOT Area:     3.46 cm  LV Volumes (MOD) LV vol d, MOD A4C: 122.0 ml LV vol s, MOD A4C: 38.4 ml LV SV MOD A4C:     122.0 ml RIGHT VENTRICLE          IVC RV Basal diam:  4.20 cm  IVC diam: 2.30 cm RV Mid diam:    3.50 cm LEFT ATRIUM             Index  RIGHT ATRIUM           Index LA diam:        3.50 cm 1.58 cm/m  RA Area:     18.50 cm LA Vol (A2C):   56.5 ml 25.48 ml/m RA Volume:   50.70 ml  22.86 ml/m LA Vol (A4C):   40.3 ml 18.17 ml/m LA Biplane Vol: 48.7 ml 21.96 ml/m  AORTIC VALVE LVOT Vmax:   88.70 cm/s LVOT Vmean:  61.300 cm/s LVOT VTI:    0.195 m  AORTA Ao Root diam: 2.90 cm Ao Asc diam:  3.20 cm MITRAL VALVE MV Area (PHT): 4.31 cm    SHUNTS MV Decel Time: 176 msec    Systemic VTI:  0.20 m MV E velocity: 95.10 cm/s  Systemic Diam: 2.10 cm MV A velocity: 68.40 cm/s MV E/A ratio:  1.39 Rozann Lesches MD Electronically signed by Rozann Lesches MD Signature Date/Time: 07/20/2019/2:59:52 PM    Final    CT VENOGRAM ABD/PEL  Result Date: 07/22/2019 CLINICAL DATA:  Symptomatic DVT of the left lower extremity with duplex ultrasound demonstrating thrombus in the femoral vein, popliteal vein and calf veins. Assessment for  additional potential iliac vein thrombus. EXAM: CT VENOGRAM ABD-PELVIS TECHNIQUE: Multidetector CT imaging of the abdomen and pelvis was performed using the standard protocol during bolus administration of intravenous contrast. Multiplanar reconstructed images and MIPs were obtained and reviewed to evaluate the vascular anatomy. Imaging was timed for venous evaluation of the abdomen and pelvis. CONTRAST:  127mL OMNIPAQUE IOHEXOL 350 MG/ML SOLN COMPARISON:  Results from a lower extremity venous duplex study on 07/20/2019 FINDINGS: VASCULAR Veins: The IVC and bilateral iliac veins are normally patent without evidence of thrombus, stenosis or anatomic variant. Common femoral veins are also normally patent bilaterally. No evidence of extrinsic compression of venous structures. Other venous structures including the renal veins, portal vein, splenic vein and mesenteric veins demonstrate normal patency. Review of the MIP images confirms the above findings. NON-VASCULAR Lower chest: Patchy peripheral opacities in the left lower lobe. Stable pericardial cyst at the right cardiophrenic sulcus. Hepatobiliary: No focal liver abnormality is seen. No gallstones, gallbladder wall thickening, or biliary dilatation. Pancreas: Unremarkable. No pancreatic ductal dilatation or surrounding inflammatory changes. Spleen: Normal in size without focal abnormality. Adrenals/Urinary Tract: Adrenal glands are unremarkable. Kidneys are normal, without renal calculi, focal lesion, or hydronephrosis. Bladder is unremarkable. Stomach/Bowel: Visualized bowel demonstrates no evidence of obstruction, inflammation or lesion. No free air identified. Lymphatic: No enlarged lymph nodes identified in the abdomen or pelvis. Reproductive: Prostate is unremarkable. Other: No abdominal wall hernia or abnormality. No abdominopelvic ascites. Musculoskeletal: No acute or significant osseous findings. IMPRESSION: 1. Normal CT venogram of the abdomen and pelvis.  No evidence of deep venous thrombosis involving the IVC, bilateral iliac veins or bilateral common femoral veins. 2. Patchy peripheral opacities in the left lower lobe. 3. Stable pericardial cyst at the right cardiophrenic sulcus. 4. No acute findings in the abdomen or pelvis. Electronically Signed   By: Aletta Edouard M.D.   On: 07/22/2019 08:11   VAS [place] LOWER EXTREMITY VENOUS (DVT)  Result Date: 07/21/2019  Lower Venous DVTStudy Indications: Covid-19,, Swelling, and Pain.  Comparison Study: No prior LLE venous study on file Performing Technologist: Sharion Dove RVS  Examination Guidelines: A complete evaluation includes B-mode imaging, spectral Doppler, color Doppler, and power Doppler as needed of all accessible portions of each vessel. Bilateral testing is considered an integral part of a complete examination. Limited examinations for reoccurring  indications may be performed as noted. The reflux portion of the exam is performed with the patient in reverse Trendelenburg.  +-----+---------------+---------+-----------+----------+--------------+ RIGHTCompressibilityPhasicitySpontaneityPropertiesThrombus Aging +-----+---------------+---------+-----------+----------+--------------+ CFV  Full           Yes      Yes                                 +-----+---------------+---------+-----------+----------+--------------+   +---------+---------------+---------+-----------+----------+--------------+ LEFT     CompressibilityPhasicitySpontaneityPropertiesThrombus Aging +---------+---------------+---------+-----------+----------+--------------+ CFV      Full           Yes      Yes                                 +---------+---------------+---------+-----------+----------+--------------+ SFJ      Full                                                        +---------+---------------+---------+-----------+----------+--------------+ FV Prox  Partial        Yes      Yes                   Acute          +---------+---------------+---------+-----------+----------+--------------+ FV Mid   None                                         Acute          +---------+---------------+---------+-----------+----------+--------------+ FV DistalNone                                         Acute          +---------+---------------+---------+-----------+----------+--------------+ PFV      Full           Yes      Yes                  Acute          +---------+---------------+---------+-----------+----------+--------------+ POP      Partial        No       No                   Acute          +---------+---------------+---------+-----------+----------+--------------+ PTV      None                                         Acute          +---------+---------------+---------+-----------+----------+--------------+ PERO     None                                         Acute          +---------+---------------+---------+-----------+----------+--------------+     Summary: RIGHT: - No evidence of common femoral vein obstruction.  LEFT: -  Findings consistent with acute deep vein thrombosis involving the left femoral vein, left popliteal vein, left posterior tibial veins, and left peroneal veins.  *See table(s) above for measurements and observations. Electronically signed by Ruta Hinds MD on 07/21/2019 at 12:32:04 PM.    Final      Assessment & Plan:   History of COVID-19 Will check follow up CBC and CMP and call with results  Cough: Slowly improving  -Continue gastroesophageal reflux disease treatment with elevating the head your bed and taking antacids  -May try over-the-counter antihistamines and nasal fluticasone to help with allergic rhinitis  -You need to try to suppress your cough to allow your larynx (voice box) to heal.  For three days don't talk, laugh, sing, or clear your throat. Do everything you can to suppress the cough during this time.  -Use hard  candies (sugarless Jolly Ranchers) or non-mint or non-menthol containing cough drops during this time to soothe your throat.    -Use a cough suppressant (Delsym or what I have prescribed you) around the clock during this time.    -After three days, gradually increase the use of your voice and back off on the cough suppressants.  Will order tessalon perles   DVT/PE: Continue Eliquis - starting March 30 - will take 5 mg twice daily Will place referral to hematology - further evaluation to determine appropriate duration of anticoagulation  Hampton's Hump  Nocturnal Sinus bradycardia Pulmonary Artery Hypertension seen on CTA: Will refer to pulmonary for further evaluation including possible sleep study May need repeat CT in 3 months  Follow up with PCP in 1 month or sooner if needed       Fenton Foy, NP 07/30/2019

## 2019-07-30 NOTE — Patient Instructions (Addendum)
Will check follow up CBC and CMP and call with results  Cough: -Continue gastroesophageal reflux disease treatment with elevating the head your bed and taking antacids  -May try over-the-counter antihistamines and nasal fluticasone to help with allergic rhinitis  -You need to try to suppress your cough to allow your larynx (voice box) to heal.  For three days don't talk, laugh, sing, or clear your throat. Do everything you can to suppress the cough during this time.  -Use hard candies (sugarless Jolly Ranchers) or non-mint or non-menthol containing cough drops during this time to soothe your throat.    -Use a cough suppressant (Delsym or what I have prescribed you) around the clock during this time.    -After three days, gradually increase the use of your voice and back off on the cough suppressants.  Will order tessalon perles   DVT/PE: Continue Eliquis - starting March 30 - will take 5 mg twice daily Will place referral to hematology - further evaluation to determine appropriate duration of anticoagulation  Hampton's Hump  Nocturnal Sinus bradycardia Pulmonary Artery Hypertension seen on CTA: Will refer to pulmonary for further evaluation including possible sleep study May need repeat CT in 3 months  Follow up with PCP in 1 month or sooner if needed

## 2019-07-30 NOTE — Assessment & Plan Note (Signed)
Will check follow up CBC and CMP and call with results  Cough: Slowly improving  -Continue gastroesophageal reflux disease treatment with elevating the head your bed and taking antacids  -May try over-the-counter antihistamines and nasal fluticasone to help with allergic rhinitis  -You need to try to suppress your cough to allow your larynx (voice box) to heal.  For three days don't talk, laugh, sing, or clear your throat. Do everything you can to suppress the cough during this time.  -Use hard candies (sugarless Jolly Ranchers) or non-mint or non-menthol containing cough drops during this time to soothe your throat.    -Use a cough suppressant (Delsym or what I have prescribed you) around the clock during this time.    -After three days, gradually increase the use of your voice and back off on the cough suppressants.  Will order tessalon perles   DVT/PE: Continue Eliquis - starting March 30 - will take 5 mg twice daily Will place referral to hematology - further evaluation to determine appropriate duration of anticoagulation  Hampton's Hump  Nocturnal Sinus bradycardia Pulmonary Artery Hypertension seen on CTA: Will refer to pulmonary for further evaluation including possible sleep study May need repeat CT in 3 months  Follow up with PCP in 1 month or sooner if needed

## 2019-07-31 ENCOUNTER — Telehealth: Payer: Self-pay | Admitting: Oncology

## 2019-07-31 LAB — COMPREHENSIVE METABOLIC PANEL
ALT: 55 IU/L — ABNORMAL HIGH (ref 0–44)
AST: 28 IU/L (ref 0–40)
Albumin/Globulin Ratio: 1.6 (ref 1.2–2.2)
Albumin: 4.6 g/dL (ref 3.8–4.9)
Alkaline Phosphatase: 97 IU/L (ref 39–117)
BUN/Creatinine Ratio: 21 — ABNORMAL HIGH (ref 9–20)
BUN: 21 mg/dL (ref 6–24)
Bilirubin Total: 0.5 mg/dL (ref 0.0–1.2)
CO2: 17 mmol/L — ABNORMAL LOW (ref 20–29)
Calcium: 9.7 mg/dL (ref 8.7–10.2)
Chloride: 103 mmol/L (ref 96–106)
Creatinine, Ser: 1 mg/dL (ref 0.76–1.27)
GFR calc Af Amer: 98 mL/min/{1.73_m2} (ref >59)
GFR calc non Af Amer: 84 mL/min/{1.73_m2} (ref >59)
Globulin, Total: 2.9 g/dL (ref 1.5–4.5)
Glucose: 93 mg/dL (ref 65–99)
Potassium: 4.8 mmol/L (ref 3.5–5.2)
Sodium: 139 mmol/L (ref 134–144)
Total Protein: 7.5 g/dL (ref 6.0–8.5)

## 2019-07-31 LAB — CBC
Hematocrit: 48.9 % (ref 37.5–51.0)
Hemoglobin: 16.4 g/dL (ref 13.0–17.7)
MCH: 31.1 pg (ref 26.6–33.0)
MCHC: 33.5 g/dL (ref 31.5–35.7)
MCV: 93 fL (ref 79–97)
Platelets: 151 10*3/uL (ref 150–450)
RBC: 5.27 x10E6/uL (ref 4.14–5.80)
RDW: 13.2 % (ref 11.6–15.4)
WBC: 5.8 10*3/uL (ref 3.4–10.8)

## 2019-07-31 NOTE — Telephone Encounter (Signed)
Received a new hem referral from Dr. [name] for acute dvt. Mr. [name] has been cld and scheduled to see Dr. Alen Blew on 4/23 at 11am. Pt aware to arrive 15 minutes early.

## 2019-08-01 ENCOUNTER — Telehealth: Payer: Self-pay | Admitting: Family Medicine

## 2019-08-01 NOTE — Progress Notes (Signed)
Spoke with patient regarding results, patient verbally understood. No additional questions

## 2019-08-01 NOTE — Telephone Encounter (Signed)
Pt tested positive for COVID on March 10th. He had a lingering cough so he went to the [place] [facility] and they took blood. They stated it came back normal except for his Liver is elevated and that he needs to set up a blood test with his PCP within three weeks from today.   Pt is not sure if he should make an appt with his PCP first or if he wants to send in the orders and make an appt after for results?   Pt can be reached at [phone]

## 2019-08-01 NOTE — Telephone Encounter (Signed)
Called patient and scheduled him for 08/21/19 to discuss after effects from COVID and repeat labs per message below. Patient verbalized an understanding.

## 2019-08-05 ENCOUNTER — Inpatient Hospital Stay: Payer: 59 | Admitting: Family Medicine

## 2019-08-06 ENCOUNTER — Other Ambulatory Visit: Payer: Self-pay | Admitting: Rheumatology

## 2019-08-06 ENCOUNTER — Other Ambulatory Visit: Payer: Self-pay | Admitting: Internal Medicine

## 2019-08-06 ENCOUNTER — Other Ambulatory Visit: Payer: Self-pay

## 2019-08-06 DIAGNOSIS — Z79899 Other long term (current) drug therapy: Secondary | ICD-10-CM

## 2019-08-06 LAB — CBC WITH DIFFERENTIAL/PLATELET
Absolute Monocytes: 294 cells/uL (ref 200–950)
Basophils Absolute: 32 cells/uL (ref 0–200)
Basophils Relative: 0.5 %
Eosinophils Absolute: 58 cells/uL (ref 15–500)
Eosinophils Relative: 0.9 %
HCT: 45.5 % (ref 38.5–50.0)
Hemoglobin: 15 g/dL (ref 13.2–17.1)
Lymphs Abs: 1824 cells/uL (ref 850–3900)
MCH: 30.8 pg (ref 27.0–33.0)
MCHC: 33 g/dL (ref 32.0–36.0)
MCV: 93.4 fL (ref 80.0–100.0)
MPV: 12 fL (ref 7.5–12.5)
Monocytes Relative: 4.6 %
Neutro Abs: 4192 cells/uL (ref 1500–7800)
Neutrophils Relative %: 65.5 %
Platelets: 139 10*3/uL — ABNORMAL LOW (ref 140–400)
RBC: 4.87 10*6/uL (ref 4.20–5.80)
RDW: 13.3 % (ref 11.0–15.0)
Total Lymphocyte: 28.5 %
WBC: 6.4 10*3/uL (ref 3.8–10.8)

## 2019-08-06 LAB — COMPLETE METABOLIC PANEL WITH GFR
AG Ratio: 1.7 (calc) (ref 1.0–2.5)
ALT: 34 U/L (ref 9–46)
AST: 19 U/L (ref 10–35)
Albumin: 4.3 g/dL (ref 3.6–5.1)
Alkaline phosphatase (APISO): 83 U/L (ref 35–144)
BUN: 21 mg/dL (ref 7–25)
CO2: 28 mmol/L (ref 20–32)
Calcium: 9.6 mg/dL (ref 8.6–10.3)
Chloride: 106 mmol/L (ref 98–110)
Creat: 1.02 mg/dL (ref 0.70–1.33)
GFR, Est African American: 95 mL/min/{1.73_m2} (ref >=60)
GFR, Est Non African American: 82 mL/min/{1.73_m2} (ref >=60)
Globulin: 2.6 g/dL (calc) (ref 1.9–3.7)
Glucose, Bld: 96 mg/dL (ref 65–99)
Potassium: 4.3 mmol/L (ref 3.5–5.3)
Sodium: 141 mmol/L (ref 135–146)
Total Bilirubin: 0.5 mg/dL (ref 0.2–1.2)
Total Protein: 6.9 g/dL (ref 6.1–8.1)

## 2019-08-07 ENCOUNTER — Ambulatory Visit (HOSPITAL_BASED_OUTPATIENT_CLINIC_OR_DEPARTMENT_OTHER): Payer: 59 | Admitting: Pharmacist

## 2019-08-07 ENCOUNTER — Other Ambulatory Visit: Payer: Self-pay

## 2019-08-07 ENCOUNTER — Other Ambulatory Visit: Payer: Self-pay | Admitting: Pharmacist

## 2019-08-07 DIAGNOSIS — Z79899 Other long term (current) drug therapy: Secondary | ICD-10-CM

## 2019-08-07 MED ORDER — ENBREL 50 MG/ML ~~LOC~~ SOSY: PREFILLED_SYRINGE | SUBCUTANEOUS | 0 refills | Status: DC

## 2019-08-07 NOTE — Telephone Encounter (Signed)
Last Visit: 06/11/19 Next Visit: 11/06/19 Labs: 08/06/19 CMP is normal, CBC shows low platelets which are stable. TB Gold: 01/04/19 Neg   Okay to refill per Dr. Estanislado Pandy

## 2019-08-07 NOTE — Progress Notes (Signed)
   S: Patient presents virtually today for review of his specialty medication.  Patient is currently taking Enbrel for psoriatic arthritis. Patient is managed by Dr. Estanislado Pandy for this.   Adherence: recovering from recent covid-19 infection. Was admitted to the hospital from 07/19/19 - 07/24/19. Enbrel was held on admission. Patient had recent blood work evaluated by Dr. Estanislado Pandy (08/06/19) and a new rx for Enbrel was sent to his pharmacy today. He is scheduled for follow-up w/ Dr. Estanislado Pandy 11/06/2019.  Efficacy: continues to work well for him  Dosing: Enbrel 50 mg weekly  Drug-drug interactions: none  Screening: TB test: completed per patient (negative) Hepatitis: completed per patient  Monitoring: S/sx of infection: denies CBC: done q3 months, see below S/sx of hypersensitivity: denies S/sx of malignancy: denies S/sx of heart failure: denies  O: Lab Results  Component Value Date   WBC 6.4 08/06/2019   HGB 15.0 08/06/2019   HCT 45.5 08/06/2019   MCV 93.4 08/06/2019   PLT 139 (L) 08/06/2019      Chemistry      Component Value Date/Time   NA 141 08/06/2019 1339   NA 139 07/30/2019 0955   K 4.3 08/06/2019 1339   CL 106 08/06/2019 1339   CO2 28 08/06/2019 1339   BUN 21 08/06/2019 1339   BUN 21 07/30/2019 0955   CREATININE 1.02 08/06/2019 1339      Component Value Date/Time   CALCIUM 9.6 08/06/2019 1339   ALKPHOS 97 07/30/2019 0955   AST 19 08/06/2019 1339   ALT 34 08/06/2019 1339   BILITOT 0.5 08/06/2019 1339   BILITOT 0.5 07/30/2019 0955       A/P: 1. Medication review: patient on Enbrel for psoriatic arthritis and is tolerating it well.  No questions or concerns about medication. Reviewed the medication with him, including the increase risk of infection, the need for close monitoring of lab work, the possible increased risk of malignancy, and the risk of injection site reactions. It appears safe to resume Enbrel at this time. Dr. Estanislado Pandy signed off on blood  work and sent new rx to pharmacy today. Will resend under Dr. Doreene Burke. No recommendations for any changes.   Benard Halsted, PharmD, [place] [phone]

## 2019-08-07 NOTE — Progress Notes (Signed)
CMP is normal, CBC shows low platelets which are stable.

## 2019-08-12 ENCOUNTER — Other Ambulatory Visit: Payer: Self-pay | Admitting: Nurse Practitioner

## 2019-08-12 DIAGNOSIS — L080 Pyoderma: Secondary | ICD-10-CM | POA: Diagnosis not present

## 2019-08-12 DIAGNOSIS — L0291 Cutaneous abscess, unspecified: Secondary | ICD-10-CM | POA: Diagnosis not present

## 2019-08-12 MED FILL — BENZONATATE 100 MG CAPS: 100 | 10 days supply | Qty: 20 | Fill #0

## 2019-08-15 ENCOUNTER — Ambulatory Visit (INDEPENDENT_AMBULATORY_CARE_PROVIDER_SITE_OTHER): Payer: 59 | Admitting: Nurse Practitioner

## 2019-08-15 ENCOUNTER — Other Ambulatory Visit: Payer: Self-pay

## 2019-08-15 DIAGNOSIS — Z8616 Personal history of COVID-19: Secondary | ICD-10-CM | POA: Diagnosis not present

## 2019-08-15 MED ORDER — BENZONATATE 200 MG PO CAPS: 200.0000 mg | ORAL_CAPSULE | Freq: Two times a day (BID) | ORAL | 0 refills | Status: DC | PRN

## 2019-08-15 MED ORDER — CETIRIZINE HCL 10 MG PO TABS: 10.0000 mg | ORAL_TABLET | Freq: Every day | ORAL | 11 refills | Status: DC

## 2019-08-15 MED ORDER — OMEPRAZOLE 20 MG PO CPDR: 20.0000 mg | DELAYED_RELEASE_CAPSULE | Freq: Every day | ORAL | 0 refills | Status: DC

## 2019-08-15 MED FILL — OMEPRAZOLE 20 MG CAP: 20 | 30 days supply | Qty: 30 | Fill #0

## 2019-08-15 MED FILL — CETIRIZINE HCL 10 MG TABS: 10 | 30 days supply | Qty: 30 | Fill #0

## 2019-08-15 MED FILL — BENZONATATE 200 MG CAP: 200 | 10 days supply | Qty: 20 | Fill #0

## 2019-08-15 NOTE — Patient Instructions (Addendum)
Cough:  -Continue gastroesophageal reflux disease treatment with elevating the head your bed and taking antacids  -May try over-the-counter antihistamines and nasal fluticasone to help with allergic rhinitis  -You need to try to suppress your cough to allow your larynx (voice box) to heal. For three days don't talk, laugh, sing, or clear your throat. Do everything you can to suppress the cough during this time.  -Use hard candies (sugarless Jolly Ranchers) or non-mint or non-menthol containing cough drops during this time to soothe your throat.   -Use a cough suppressant (Delsym or what I have prescribed you) around the clock during this time.   -After three days, gradually increase the use of your voice and back off on the cough suppressants.  Will re-order tessalon perles   DVT/PE: Continue Eliquis Please keep upcoming appointment with hematology - further evaluation to determine appropriate duration of anticoagulation  [place]  Nocturnal Sinus bradycardia Pulmonary Artery Hypertension seen on CTA: Referral has been placed to pulmonary for further evaluation including possible sleep study Will need repeat CT in 3 months

## 2019-08-15 NOTE — Assessment & Plan Note (Signed)
Cough:  -Continue gastroesophageal reflux disease treatment with elevating the head your bed and taking antacids  -May try over-the-counter antihistamines and nasal fluticasone to help with allergic rhinitis  -You need to try to suppress your cough to allow your larynx (voice box) to heal. For three days don't talk, laugh, sing, or clear your throat. Do everything you can to suppress the cough during this time.  -Use hard candies (sugarless Jolly Ranchers) or non-mint or non-menthol containing cough drops during this time to soothe your throat.   -Use a cough suppressant (Delsym or what I have prescribed you) around the clock during this time.   -After three days, gradually increase the use of your voice and back off on the cough suppressants.  Will re-order tessalon perles   DVT/PE: Continue Eliquis Please keep upcoming appointment with hematology - further evaluation to determine appropriate duration of anticoagulation  [place]  Nocturnal Sinus bradycardia Pulmonary Artery Hypertension seen on CTA: Referral has been placed to pulmonary for further evaluation including possible sleep study Will need repeat CT in 3 months

## 2019-08-15 NOTE — Progress Notes (Signed)
@Patient  ID: [name], male    DOB: 04/19/1966, 54 y.o.   MRN: [id number]  Chief Complaint  Patient presents with  . Follow-up    Patient states he is feeling way better since his last OV. Still having a cough stated Tessalon has helped with this     Referring provider: Eulas Post, MD   54 year old male with past medical history of psoriatic arthritis.  Diagnosed with Covid on 07/10/2019.   Recent Significant Encounters:   3/19>> admit to [facility] for large PE with CT evidence of RV strain and LLE DVT - teated with remdesivir and decadron. DVT prophylaxis - IV heparin>>Eliquis [id number] RV systolic function. EF 60-65% 3/21>> transferred to [facility] for IR evaluation for catheter guided lytic therapy 07/30/19>>Post Covid Care Visit - treated for cough, Referral placed to hematology to determine duration of anticoagulation for DVT/PE, Referral placed to pulmonary for hampton's hump, nocturnal bradycardia, and PAH.   Imaging:  CTA 07/19/19: Bilateral lobar and peripheral pulmonary arteries with right heart strain, the RV:LV ratio 1.2. Occlusive thrombus in the right upper lobar arteries. 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 Unenhanced CT chest recommended in 3 months. A benign pericardial recess cyst, slightly increased in size since 2013. No additional surveillance imaging is recommended. Borderline cardiomegaly with mild coronary calcification and pulmonary artery hypertension. Trace bilateral pleural fluid.    HPI   Patient presents today for post Covid care clinic follow-up.  He was last seen here on 07/30/2019.    Cough: Patient states that he is overall much improved since last visit.  He does still complain of lingering cough.  He denies any shortness of breath.  He states that his cough is nonproductive.  At his last visit he was prescribed Tessalon Perles and had good relief with this.  He states that he noticed when his  prescription ran out that the cough was much worse.  He also ran out of his prescription of Prilosec at that same time.  His Tessalon Perles were reordered and he states that they have been helping. Patient is not currently taking Prilosec or any antihistamine at this time. Denies f/c/s, n/v/d, hemoptysis, PND, chest pain or edema.   DVT/PE: Referral was placed at last visit to hematology for follow up to determine appropriate duration of anticoagulation. Patient does have upcoming appointment scheduled with hematology next week. He is compliant with Eliquis.   Referral was also placed at last visit for referral to pulmonary. CTA during hospitalization showed hampton's hump and Pulmonary Artery Hypertension - recommended follow-up. Also noted during hospital stay that patient was having nocturnal sinus bradycardia and concern arose for possible sleep apnea. Patient has upcoming appointment with pulmonary next week.     Allergies  Allergen Reactions  . Gold-Containing Drug Products Rash    Denies oral or airway involvement - occurred in the 1980s.     Immunization History  Administered Date(s) Administered  . Influenza,inj,Quad PF,6+ Mos 02/03/2017, 03/21/2018  . Pneumococcal Conjugate-13 03/21/2018  . Tdap 09/22/2014  . Zoster Recombinat (Shingrix) 04/02/2018, 05/23/2018    Past Medical History:  Diagnosis Date  . Anemia   . History of avascular necrosis of capital femoral epiphysis 2006, 2007   Bilateral Femoral Head  . HLD (hyperlipidemia)   . Pneumonia    twice  . Psoriatic arthritis ([place])     Tobacco History: Social History   Tobacco Use  Smoking Status Never Smoker  Smokeless Tobacco  Never Used   Counseling given: Not Answered   Outpatient Encounter Medications as of 08/15/2019  Medication Sig  . acetaminophen (TYLENOL) 500 MG tablet Take 1,000 mg by mouth every 8 (eight) hours as needed for mild pain or moderate pain.   [name] apixaban (ELIQUIS) 5 MG TABS tablet Take 2  tablets (10 mg total) by mouth 2 (two) times daily.  [name] apixaban (ELIQUIS) 5 MG TABS tablet Take 1 tablet (5 mg total) by mouth 2 (two) times daily.  . benzonatate (TESSALON) 100 MG capsule TAKE 1 CAPSULE BY MOUTH TWICE DAILY AS NEEDED FOR COUGH  . cholecalciferol (VITAMIN D) 1000 units tablet Take 2,000 Units by mouth daily.   . Coenzyme Q10 (COQ10) 100 MG CAPS Take 100 mg by mouth daily.   [name] etanercept (ENBREL) 50 MG/ML injection Inject 1 syringe into the skin once a week.  . levocetirizine (XYZAL) 5 MG tablet Take 5 mg by mouth daily.  . Magnesium Oxide (MAG-200) 200 MG TABS Take 200 mg by mouth daily.  . Melatonin 5 MG CAPS Take 5 mg by mouth at bedtime.   . Omega-3 Fatty Acids (FISH OIL ULTRA) 1400 MG CAPS Take 1,400 mg by mouth daily.  [name] omeprazole (PRILOSEC) 20 MG capsule Take 1 capsule (20 mg total) by mouth daily.  . TURMERIC PO Take 15 mLs by mouth daily.   . [DISCONTINUED] omeprazole (PRILOSEC) 20 MG capsule Take 20 mg by mouth daily.  [name] amLODipine (NORVASC) 5 MG tablet Take 1 tablet (5 mg total) by mouth daily. (Patient not taking: Reported on 07/26/2019)  . benzonatate (TESSALON) 200 MG capsule Take 1 capsule (200 mg total) by mouth 2 (two) times daily as needed for cough.  . cetirizine (ZYRTEC) 10 MG tablet Take 1 tablet (10 mg total) by mouth daily.   No facility-administered encounter medications on file as of 08/15/2019.     Review of Systems  Review of Systems  Constitutional: Negative.   HENT: Negative.   Respiratory: Positive for cough and shortness of breath.   Cardiovascular: Negative.   Gastrointestinal: Negative.   Allergic/Immunologic: Negative.   Neurological: Negative.   Psychiatric/Behavioral: Negative.        Physical Exam  BP 125/75 (BP Location: Left Arm, Patient Position: Sitting, Cuff Size: Large)   Pulse 75   Temp (!) 97.5 F (36.4 C)   Ht 5\' 9"  (1.753 m)   Wt 236 lb (107 kg)   SpO2 95%   BMI 34.85 kg/m   Wt Readings from Last 5  Encounters:  08/15/19 236 lb (107 kg)  07/30/19 237 lb 8 oz (107.7 kg)  07/20/19 244 lb 0.8 oz (110.7 kg)  06/11/19 253 lb 6.4 oz (114.9 kg)  04/10/19 246 lb (111.6 kg)     Physical Exam Vitals and nursing note reviewed.  Constitutional:      General: He is not in acute distress.    Appearance: He is well-developed.  Cardiovascular:     Rate and Rhythm: Normal rate and regular rhythm.  Pulmonary:     Effort: Pulmonary effort is normal. No respiratory distress.     Breath sounds: Normal breath sounds. No wheezing or rhonchi.  Skin:    General: Skin is warm and dry.  Neurological:     Mental Status: He is alert and oriented to person, place, and time.  Psychiatric:        Mood and Affect: Mood normal.        Behavior: Behavior normal.  Lab Results:  CBC    Component Value Date/Time   WBC 6.4 08/06/2019 1339   RBC 4.87 08/06/2019 1339   HGB 15.0 08/06/2019 1339   HGB 16.4 07/30/2019 0955   HCT 45.5 08/06/2019 1339   HCT 48.9 07/30/2019 0955   PLT 139 (L) 08/06/2019 1339   PLT 151 07/30/2019 0955   MCV 93.4 08/06/2019 1339   MCV 93 07/30/2019 0955   MCH 30.8 08/06/2019 1339   MCHC 33.0 08/06/2019 1339   RDW 13.3 08/06/2019 1339   RDW 13.2 07/30/2019 0955   LYMPHSABS 1,824 08/06/2019 1339   MONOABS 0.4 07/19/2019 2035   EOSABS 58 08/06/2019 1339   BASOSABS 32 08/06/2019 1339    BMET    Component Value Date/Time   NA 141 08/06/2019 1339   NA 139 07/30/2019 0955   K 4.3 08/06/2019 1339   CL 106 08/06/2019 1339   CO2 28 08/06/2019 1339   GLUCOSE 96 08/06/2019 1339   BUN 21 08/06/2019 1339   BUN 21 07/30/2019 0955   CREATININE 1.02 08/06/2019 1339   CALCIUM 9.6 08/06/2019 1339   GFRNONAA 82 08/06/2019 1339   GFRAA 95 08/06/2019 1339    BNP    Component Value Date/Time   BNP 20.1 07/19/2019 2140    ProBNP No results found for: PROBNP  Imaging: CT Angio Chest PE W and/or Wo Contrast  Result Date: 07/19/2019 CLINICAL DATA:  High probability  for pulmonary embolism. Left calf pain. COVID-19 diagnosis 2 weeks ago. Dehydration. EXAM: CT ANGIOGRAPHY CHEST WITH CONTRAST TECHNIQUE: Multidetector CT imaging of the chest was performed using the standard protocol during bolus administration of intravenous contrast. Multiplanar CT image reconstructions and MIPs were obtained to evaluate the vascular anatomy. Automatic exposure control utilized. CONTRAST:  178mL OMNIPAQUE IOHEXOL 350 MG/ML SOLN COMPARISON:  None. FINDINGS: Cardiovascular: Occlusive thrombus in the right upper lobar arteries. Additional bilateral more peripheral pulmonary emboli including nonocclusive thrombus in the right medial lower lobar pulmonary artery. Pulmonary artery hypertension, the pulmonary trunk measuring 3.8 cm diameter. Borderline cardiomegaly with mild coronary calcification. Right ventricular to left ventricular ratio 1.2, consistent with right heart strain. No pericardial fluid. Mediastinum/Nodes: Subcentimeter noncalcified benign-appearing mediastinal lymph nodes. Small hiatal hernia. A 6 x 2 x 2 cm 22 Hounsfield unit cyst is present in the right cardiophrenic angle or slightly increased in size from 4 x 2 x 2 cm on the July 30, 2011 study. 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 Trace bilateral pleural fluid. Central pulmonary vascular congestion without overt pulmonary edema. Subpleural atelectasis. Upper Abdomen: Benign-appearing water density right renal cortical cysts measuring 8 mm in the midpole and 35 mm in the upper pole. Mild fatty infiltration of the pancreas. Musculoskeletal: Moderate degenerative changes. Bridging anterior osteophytes in the lower thoracic spine. Review of the MIP images confirms the above findings. I discussed critical results by telephone at the time of interpretation on 07/19/2019 at 10:25 p.m. Eastern standard time with provider Isla Pence , who verbally acknowledged these results.  IMPRESSION: Bilateral lobar and peripheral pulmonary arteries with right heart strain, the RV:LV ratio 1.2. Occlusive thrombus in the right upper lobar arteries. 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 Unenhanced CT chest recommended in 3 months. A benign pericardial recess cyst, slightly increased in size since 2013. No additional surveillance imaging is recommended. Borderline cardiomegaly with mild coronary calcification and pulmonary artery hypertension. Trace bilateral pleural fluid. Electronically Signed  By: Revonda Humphrey   On: 07/19/2019 22:34   DG Chest Port 1 View  Result Date: 07/19/2019 CLINICAL DATA:  Shortness of breath, COVID positive EXAM: PORTABLE CHEST 1 VIEW COMPARISON:  2012 FINDINGS: Low lung volumes. No consolidation or edema. No pleural effusion or pneumothorax cardiomediastinal silhouette is likely within normal limits for portable technique. IMPRESSION: No acute process in the chest. Electronically Signed   By: Macy Mis M.D.   On: 07/19/2019 21:05   ECHOCARDIOGRAM COMPLETE  Result Date: 07/20/2019    ECHOCARDIOGRAM REPORT   Patient Name:   PAWEL STVIL Date of Exam: 07/20/2019 Medical Rec #:  [id number]      Height:       69.0 in Accession #:    [id number]     Weight:       236.3 lb Date of Birth:  12-09-1965       BSA:          2.218 m Patient Age:    40 years       BP:           141/82 mmHg Patient Gender: M              HR:           72 bpm. Exam Location:  Inpatient Procedure: 2D Echo, Cardiac Doppler and Color Doppler Indications:    I26.02 Pulmonary embolus  History:        Patient has no prior history of Echocardiogram examinations.                 CV19.  Sonographer:    Merrie Roof RDCS Referring Phys: [id number] [place]  1. Left ventricular ejection fraction, by estimation, is 60 to 65%. The left ventricle has normal function. The left ventricle has no regional wall motion abnormalities. There is mild left  ventricular hypertrophy. Left ventricular diastolic parameters were normal.  2. Right ventricular systolic function is normal. The right ventricular size is normal. Tricuspid regurgitation signal is inadequate for assessing PA pressure.  3. The mitral valve is grossly normal. Trivial mitral valve regurgitation.  4. The aortic valve is tricuspid. Aortic valve regurgitation is not visualized.  5. The inferior vena cava is normal in size with greater than 50% respiratory variability, suggesting right atrial pressure of 3 mmHg. FINDINGS  Left Ventricle: Left ventricular ejection fraction, by estimation, is 60 to 65%. The left ventricle has normal function. The left ventricle has no regional wall motion abnormalities. The left ventricular internal cavity size was normal in size. There is  mild left ventricular hypertrophy. Left ventricular diastolic parameters were normal. Right Ventricle: The right ventricular size is normal. No increase in right ventricular wall thickness. Right ventricular systolic function is normal. Tricuspid regurgitation signal is inadequate for assessing PA pressure. Left Atrium: Left atrial size was normal in size. Right Atrium: Right atrial size was normal in size. Pericardium: There is no evidence of pericardial effusion. Mitral Valve: The mitral valve is grossly normal. Trivial mitral valve regurgitation. Tricuspid Valve: The tricuspid valve is grossly normal. Tricuspid valve regurgitation is trivial. Aortic Valve: The aortic valve is tricuspid. Aortic valve regurgitation is not visualized. Mild aortic valve annular calcification. Pulmonic Valve: The pulmonic valve was grossly normal. Pulmonic valve regurgitation is not visualized. Aorta: The aortic root is normal in size and structure. Venous: The inferior vena cava is normal in size with greater than 50% respiratory variability, suggesting right atrial pressure of 3 mmHg. IAS/Shunts: No  atrial level shunt detected by color flow Doppler.   LEFT VENTRICLE PLAX 2D LVIDd:         4.30 cm      Diastology LVIDs:         2.90 cm      LV e' lateral:   15.20 cm/s LV PW:         1.20 cm      LV E/e' lateral: 6.3 LV IVS:        1.30 cm      LV e' medial:    9.46 cm/s LVOT diam:     2.10 cm      LV E/e' medial:  10.1 LV SV:         68 LV SV Index:   30 LVOT Area:     3.46 cm  LV Volumes (MOD) LV vol d, MOD A4C: 122.0 ml LV vol s, MOD A4C: 38.4 ml LV SV MOD A4C:     122.0 ml RIGHT VENTRICLE          IVC RV Basal diam:  4.20 cm  IVC diam: 2.30 cm RV Mid diam:    3.50 cm LEFT ATRIUM             Index       RIGHT ATRIUM           Index LA diam:        3.50 cm 1.58 cm/m  RA Area:     18.50 cm LA Vol (A2C):   56.5 ml 25.48 ml/m RA Volume:   50.70 ml  22.86 ml/m LA Vol (A4C):   40.3 ml 18.17 ml/m LA Biplane Vol: 48.7 ml 21.96 ml/m  AORTIC VALVE LVOT Vmax:   88.70 cm/s LVOT Vmean:  61.300 cm/s LVOT VTI:    0.195 m  AORTA Ao Root diam: 2.90 cm Ao Asc diam:  3.20 cm MITRAL VALVE MV Area (PHT): 4.31 cm    SHUNTS MV Decel Time: 176 msec    Systemic VTI:  0.20 m MV E velocity: 95.10 cm/s  Systemic Diam: 2.10 cm MV A velocity: 68.40 cm/s MV E/A ratio:  1.39 Rozann Lesches MD Electronically signed by Rozann Lesches MD Signature Date/Time: 07/20/2019/2:59:52 PM    Final    CT VENOGRAM ABD/PEL  Result Date: 07/22/2019 CLINICAL DATA:  Symptomatic DVT of the left lower extremity with duplex ultrasound demonstrating thrombus in the femoral vein, popliteal vein and calf veins. Assessment for additional potential iliac vein thrombus. EXAM: CT VENOGRAM ABD-PELVIS TECHNIQUE: Multidetector CT imaging of the abdomen and pelvis was performed using the standard protocol during bolus administration of intravenous contrast. Multiplanar reconstructed images and MIPs were obtained and reviewed to evaluate the vascular anatomy. Imaging was timed for venous evaluation of the abdomen and pelvis. CONTRAST:  135mL OMNIPAQUE IOHEXOL 350 MG/ML SOLN COMPARISON:  Results from a lower  extremity venous duplex study on 07/20/2019 FINDINGS: VASCULAR Veins: The IVC and bilateral iliac veins are normally patent without evidence of thrombus, stenosis or anatomic variant. Common femoral veins are also normally patent bilaterally. No evidence of extrinsic compression of venous structures. Other venous structures including the renal veins, portal vein, splenic vein and mesenteric veins demonstrate normal patency. Review of the MIP images confirms the above findings. NON-VASCULAR Lower chest: Patchy peripheral opacities in the left lower lobe. Stable pericardial cyst at the right cardiophrenic sulcus. Hepatobiliary: No focal liver abnormality is seen. No gallstones, gallbladder wall thickening, or biliary dilatation. Pancreas: Unremarkable. No pancreatic ductal dilatation  or surrounding inflammatory changes. Spleen: Normal in size without focal abnormality. Adrenals/Urinary Tract: Adrenal glands are unremarkable. Kidneys are normal, without renal calculi, focal lesion, or hydronephrosis. Bladder is unremarkable. Stomach/Bowel: Visualized bowel demonstrates no evidence of obstruction, inflammation or lesion. No free air identified. Lymphatic: No enlarged lymph nodes identified in the abdomen or pelvis. Reproductive: Prostate is unremarkable. Other: No abdominal wall hernia or abnormality. No abdominopelvic ascites. Musculoskeletal: No acute or significant osseous findings. IMPRESSION: 1. Normal CT venogram of the abdomen and pelvis. No evidence of deep venous thrombosis involving the IVC, bilateral iliac veins or bilateral common femoral veins. 2. Patchy peripheral opacities in the left lower lobe. 3. Stable pericardial cyst at the right cardiophrenic sulcus. 4. No acute findings in the abdomen or pelvis. Electronically Signed   By: Aletta Edouard M.D.   On: 07/22/2019 08:11   VAS [place] LOWER EXTREMITY VENOUS (DVT)  Result Date: 07/21/2019  Lower Venous DVTStudy Indications: Covid-19,, Swelling, and Pain.   Comparison Study: No prior LLE venous study on file Performing Technologist: Sharion Dove RVS  Examination Guidelines: A complete evaluation includes B-mode imaging, spectral Doppler, color Doppler, and power Doppler as needed of all accessible portions of each vessel. Bilateral testing is considered an integral part of a complete examination. Limited examinations for reoccurring indications may be performed as noted. The reflux portion of the exam is performed with the patient in reverse Trendelenburg.  +-----+---------------+---------+-----------+----------+--------------+ RIGHTCompressibilityPhasicitySpontaneityPropertiesThrombus Aging +-----+---------------+---------+-----------+----------+--------------+ CFV  Full           Yes      Yes                                 +-----+---------------+---------+-----------+----------+--------------+   +---------+---------------+---------+-----------+----------+--------------+ LEFT     CompressibilityPhasicitySpontaneityPropertiesThrombus Aging +---------+---------------+---------+-----------+----------+--------------+ CFV      Full           Yes      Yes                                 +---------+---------------+---------+-----------+----------+--------------+ SFJ      Full                                                        +---------+---------------+---------+-----------+----------+--------------+ FV Prox  Partial        Yes      Yes                  Acute          +---------+---------------+---------+-----------+----------+--------------+ FV Mid   None                                         Acute          +---------+---------------+---------+-----------+----------+--------------+ FV DistalNone                                         Acute          +---------+---------------+---------+-----------+----------+--------------+ PFV      Full  Yes      Yes                  Acute           +---------+---------------+---------+-----------+----------+--------------+ POP      Partial        No       No                   Acute          +---------+---------------+---------+-----------+----------+--------------+ PTV      None                                         Acute          +---------+---------------+---------+-----------+----------+--------------+ PERO     None                                         Acute          +---------+---------------+---------+-----------+----------+--------------+     Summary: RIGHT: - No evidence of common femoral vein obstruction.  LEFT: - Findings consistent with acute deep vein thrombosis involving the left femoral vein, left popliteal vein, left posterior tibial veins, and left peroneal veins.  *See table(s) above for measurements and observations. Electronically signed by Ruta Hinds MD on 07/21/2019 at 12:32:04 PM.    Final      Assessment & Plan:   History of COVID-19 Cough:  -Continue gastroesophageal reflux disease treatment with elevating the head your bed and taking antacids  -May try over-the-counter antihistamines and nasal fluticasone to help with allergic rhinitis  -You need to try to suppress your cough to allow your larynx (voice box) to heal. For three days don't talk, laugh, sing, or clear your throat. Do everything you can to suppress the cough during this time.  -Use hard candies (sugarless Jolly Ranchers) or non-mint or non-menthol containing cough drops during this time to soothe your throat.   -Use a cough suppressant (Delsym or what I have prescribed you) around the clock during this time.   -After three days, gradually increase the use of your voice and back off on the cough suppressants.  Will re-order tessalon perles   DVT/PE: Continue Eliquis Please keep upcoming appointment with hematology - further evaluation to determine appropriate duration of anticoagulation  Hampton's Hump   Nocturnal Sinus bradycardia Pulmonary Artery Hypertension seen on CTA: Referral has been placed to pulmonary for further evaluation including possible sleep study Will need repeat CT in 3 months      Fenton Foy, NP 08/15/2019

## 2019-08-19 MED FILL — ENBREL 50 MG/ML SOSY: 50 | 28 days supply | Qty: 4 | Fill #0

## 2019-08-21 ENCOUNTER — Ambulatory Visit: Payer: 59 | Admitting: Family Medicine

## 2019-08-22 ENCOUNTER — Encounter: Payer: Self-pay | Admitting: Pulmonary Disease

## 2019-08-22 ENCOUNTER — Ambulatory Visit (INDEPENDENT_AMBULATORY_CARE_PROVIDER_SITE_OTHER): Payer: 59 | Admitting: Pulmonary Disease

## 2019-08-22 ENCOUNTER — Other Ambulatory Visit: Payer: Self-pay

## 2019-08-22 VITALS — BP 120/82 | HR 59 | Temp 97.6°F | Ht 69.0 in | Wt 237.2 lb

## 2019-08-22 DIAGNOSIS — I2609 Other pulmonary embolism with acute cor pulmonale: Secondary | ICD-10-CM

## 2019-08-22 DIAGNOSIS — R0602 Shortness of breath: Secondary | ICD-10-CM | POA: Diagnosis not present

## 2019-08-22 DIAGNOSIS — Z8616 Personal history of COVID-19: Secondary | ICD-10-CM

## 2019-08-22 NOTE — Progress Notes (Signed)
Synopsis: Referred in April 2021 for shortness of breath, history of PE and Covid by Fenton Foy, NP  Subjective:   PATIENT ID: [name]: male DOB: 1965-10-14, MRN: [id number]  Chief Complaint  Patient presents with  . Consult    Referred by hospital for follow up after abn CT.    This 54 year old gentleman past medical history of psoriatic arthritis, hyperlipidemia femoral avascular necrosis.  Patient had COVID-19 07/10/2019.  Patient was treated at home.  However 07/19/2019 patient presented to the emergency department with increasing fatigue shortness of breath and left leg swelling.  Patient had CT scan of the chest with concern of right ventricular strain and pulmonary embolism.  Patient was treated with Decadron, remdesivir.  Admitted to the hospital also started on anticoagulation.  Patient was discharged from the hospital on 07/24/2019 with Eliquis.  H&P and discharge summary by Dr. Sloan Leiter was reviewed.  OV 08/22/2019: Patient seen today in the office.  No complaints from a respiratory standpoint.  He does have occasional swelling in his legs.  He is able to complete most of his activities of daily living without any trouble.  He thinks his shortness of breath is at baseline.  He feels much better than he did immediately after his hospitalization overall doing well.  Patient denies hemoptysis.  Denies blood loss.   Past Medical History:  Diagnosis Date  . Anemia   . History of avascular necrosis of capital femoral epiphysis 2006, 2007   Bilateral Femoral Head  . HLD (hyperlipidemia)   . Pneumonia    twice  . Psoriatic arthritis ([place])      Family History  Problem Relation Age of Onset  . Pneumonia Father   . Tremor Neg Hx      Past Surgical History:  Procedure Laterality Date  . JOINT REPLACEMENT Bilateral 2006, 2007   Necrosis femoral head (bilateral)  . TOTAL HIP REVISION Right 11/23/2016   Procedure: Acetabulum liner and femoral head revision;  Surgeon:  [name], MD;  Location: [facility];  Service: Orthopedics;  Laterality: Right;  . TOTAL HIP REVISION Left 04/10/2019   Procedure: Left hip bearing surface vs total hip arthroplasty revision;  Surgeon: [name], MD;  Location: [facility];  Service: Orthopedics;  Laterality: Left;  144min    Social History   Socioeconomic History  . Marital status: Married    Spouse name: Not on file  . Number of children: 1  . Years of education: Not on file  . Highest education level: Not on file  Occupational History  . Occupation: Disabled  Tobacco Use  . Smoking status: Never Smoker  . Smokeless tobacco: Never Used  Substance and Sexual Activity  . Alcohol use: Yes    Alcohol/week: 0.0 standard drinks    Comment: 3-4 drinks per week  . Drug use: No  . Sexual activity: Not on file    Comment: Married  Other Topics Concern  . Not on file  Social History Narrative   Lives at home w/ his wife   Right-handed   Caffeine: 2-3 cups per day   Social Determinants of Health   Financial Resource Strain:   . Difficulty of Paying Living Expenses:   Food Insecurity:   . Worried About Charity fundraiser in the Last Year:   . Arboriculturist in the Last Year:   Transportation Needs:   . Film/video editor (Medical):   [name] Lack of Transportation (Non-Medical):   Physical  Activity:   . Days of Exercise per Week:   . Minutes of Exercise per Session:   Stress:   . Feeling of Stress :   Social Connections:   . Frequency of Communication with Friends and Family:   . Frequency of Social Gatherings with Friends and Family:   . Attends Religious Services:   . Active Member of Clubs or Organizations:   . Attends Archivist Meetings:   [name] Marital Status:   Intimate Partner Violence:   . Fear of Current or Ex-Partner:   . Emotionally Abused:   [name] Physically Abused:   . Sexually Abused:      Allergies  Allergen Reactions  . Gold-Containing Drug Products Rash    Denies oral or  airway involvement - occurred in the 1980s.      Outpatient Medications Prior to Visit  Medication Sig Dispense Refill  . acetaminophen (TYLENOL) 500 MG tablet Take 1,000 mg by mouth every 8 (eight) hours as needed for mild pain or moderate pain.     [name] apixaban (ELIQUIS) 5 MG TABS tablet Take 2 tablets (10 mg total) by mouth 2 (two) times daily. 26 tablet 0  . apixaban (ELIQUIS) 5 MG TABS tablet Take 1 tablet (5 mg total) by mouth 2 (two) times daily. 60 tablet 0  . benzonatate (TESSALON) 100 MG capsule TAKE 1 CAPSULE BY MOUTH TWICE DAILY AS NEEDED FOR COUGH 20 capsule 5  . benzonatate (TESSALON) 200 MG capsule Take 1 capsule (200 mg total) by mouth 2 (two) times daily as needed for cough. 20 capsule 0  . cetirizine (ZYRTEC) 10 MG tablet Take 1 tablet (10 mg total) by mouth daily. 30 tablet 11  . cholecalciferol (VITAMIN D) 1000 units tablet Take 2,000 Units by mouth daily.     . Coenzyme Q10 (COQ10) 100 MG CAPS Take 100 mg by mouth daily.     [name] etanercept (ENBREL) 50 MG/ML injection Inject 1 syringe into the skin once a week. 12 mL 0  . levocetirizine (XYZAL) 5 MG tablet Take 5 mg by mouth daily.    . Magnesium Oxide (MAG-200) 200 MG TABS Take 200 mg by mouth daily.    . Melatonin 5 MG CAPS Take 5 mg by mouth at bedtime.     . Omega-3 Fatty Acids (FISH OIL ULTRA) 1400 MG CAPS Take 1,400 mg by mouth daily.    [name] omeprazole (PRILOSEC) 20 MG capsule Take 1 capsule (20 mg total) by mouth daily. 30 capsule 0  . TURMERIC PO Take 15 mLs by mouth daily.     [name] amLODipine (NORVASC) 5 MG tablet Take 1 tablet (5 mg total) by mouth daily. (Patient not taking: Reported on 07/26/2019) 30 tablet 0   No facility-administered medications prior to visit.    Review of Systems  Constitutional: Negative for chills, fever, malaise/fatigue and weight loss.  HENT: Negative for hearing loss, sore throat and tinnitus.   Eyes: Negative for blurred vision and double vision.  Respiratory: Positive for cough. Negative  for hemoptysis, sputum production, shortness of breath, wheezing and stridor.   Cardiovascular: Negative for chest pain, palpitations, orthopnea, leg swelling and PND.  Gastrointestinal: Negative for abdominal pain, constipation, diarrhea, heartburn, nausea and vomiting.  Genitourinary: Negative for dysuria, hematuria and urgency.  Musculoskeletal: Negative for joint pain and myalgias.  Skin: Negative for itching and rash.  Neurological: Negative for dizziness, tingling, weakness and headaches.  Endo/Heme/Allergies: Negative for environmental allergies. Does not bruise/bleed easily.  Psychiatric/Behavioral: Negative for depression.  The patient is not nervous/anxious and does not have insomnia.   All other systems reviewed and are negative.    Objective:  Physical Exam Vitals reviewed.  Constitutional:      General: He is not in acute distress.    Appearance: He is well-developed.  HENT:     Head: Normocephalic and atraumatic.  Eyes:     General: No scleral icterus.    Conjunctiva/sclera: Conjunctivae normal.     Pupils: Pupils are equal, round, and reactive to light.  Neck:     Vascular: No JVD.     Trachea: No tracheal deviation.  Cardiovascular:     Rate and Rhythm: Normal rate and regular rhythm.     Heart sounds: Normal heart sounds. No murmur.  Pulmonary:     Effort: Pulmonary effort is normal. No tachypnea, accessory muscle usage or respiratory distress.     Breath sounds: No stridor. No wheezing, rhonchi or rales.  Musculoskeletal:        General: No tenderness.     Cervical back: Neck supple.     Right lower leg: Edema present.     Left lower leg: Edema present.  Lymphadenopathy:     Cervical: No cervical adenopathy.  Skin:    General: Skin is warm and dry.     Capillary Refill: Capillary refill takes less than 2 seconds.     Findings: No rash.  Neurological:     Mental Status: He is alert and oriented to person, place, and time.  Psychiatric:        Behavior:  Behavior normal.      Vitals:   08/22/19 1151  BP: 120/82  Pulse: (!) 59  Temp: 97.6 F (36.4 C)  TempSrc: Temporal  SpO2: 98%  Weight: 237 lb 3.2 oz (107.6 kg)  Height: 5\' 9"  (1.753 m)   98% on RA BMI Readings from Last 3 Encounters:  08/22/19 35.03 kg/m  08/15/19 34.85 kg/m  07/30/19 35.07 kg/m   Wt Readings from Last 3 Encounters:  08/22/19 237 lb 3.2 oz (107.6 kg)  08/15/19 236 lb (107 kg)  07/30/19 237 lb 8 oz (107.7 kg)     CBC    Component Value Date/Time   WBC 6.4 08/06/2019 1339   RBC 4.87 08/06/2019 1339   HGB 15.0 08/06/2019 1339   HGB 16.4 07/30/2019 0955   HCT 45.5 08/06/2019 1339   HCT 48.9 07/30/2019 0955   PLT 139 (L) 08/06/2019 1339   PLT 151 07/30/2019 0955   MCV 93.4 08/06/2019 1339   MCV 93 07/30/2019 0955   MCH 30.8 08/06/2019 1339   MCHC 33.0 08/06/2019 1339   RDW 13.3 08/06/2019 1339   RDW 13.2 07/30/2019 0955   LYMPHSABS 1,824 08/06/2019 1339   MONOABS 0.4 07/19/2019 2035   EOSABS 58 08/06/2019 1339   BASOSABS 32 08/06/2019 1339     Chest Imaging: 07/19/2019 CT chest: Bilateral lobar pulmonary embolism, right ventricular strain, possible Hamptons hump/infarct in the bilateral bases. The patient's images have been independently reviewed by me.    Pulmonary Functions Testing Results: No flowsheet data found.  FeNO: none   Pathology: none  Echocardiogram: none  Heart Catheterization: none    Assessment & Plan:     ICD-10-CM   1. History of 2019 novel coronavirus disease (COVID-19)  Z86.16   2. Shortness of breath  R06.02 CT CHEST HIGH RESOLUTION  3. Acute pulmonary embolism with acute cor pulmonale, unspecified pulmonary embolism type (HCC)  I26.09  Assessment:   54 year old with history of COVID-19 and acute pulmonary embolism with right ventricular strain, acute cor pulmonale.  Recovering since hospitalization still with ongoing symptoms.  Remains on anticoagulation.  CT imaging with possible basilar  infarcts/Hampton hump signs on CT.  Plan Following Extensive Data Review & Interpretation:  . I reviewed prior external note(s) from 07/24/2019 discharge summary Dr. Sloan Leiter . I reviewed the result(s) of vascular lower extremity duplex and echocardiogram from 07/20/2019 . I have ordered repeat noncontrasted CT of the chest 3 months after previous to be completed in June 2021  Independent interpretation of tests . Review of patient's noncontrasted CT chest 07/19/2019 images revealed acute bilateral PE with evidence of right ventricular strain. The patient's images have been independently reviewed by me.    Patient to return to clinic to review CT imaging. If there is still parenchymal evidence of inflammation related to Covid infection could consider work-up for ILD after repeat CT.  Would prefer to hold off on obtaining pulmonary function test until patient has had time to recover from acute PE and remain on anticoagulation.  Patient will need to continue anticoagulation for a minimum of 6 months.  He did have a submassive PE.  We will have to discuss the risk-benefit of coming off.  We briefly discussed this today and he seemed content with staying on anticoagulation if there was no contraindication.  RTC in approximately 8 to 10 weeks   Current Outpatient Medications:  .  acetaminophen (TYLENOL) 500 MG tablet, Take 1,000 mg by mouth every 8 (eight) hours as needed for mild pain or moderate pain. , Disp: , Rfl:  .  apixaban (ELIQUIS) 5 MG TABS tablet, Take 2 tablets (10 mg total) by mouth 2 (two) times daily., Disp: 26 tablet, Rfl: 0 .  apixaban (ELIQUIS) 5 MG TABS tablet, Take 1 tablet (5 mg total) by mouth 2 (two) times daily., Disp: 60 tablet, Rfl: 0 .  benzonatate (TESSALON) 100 MG capsule, TAKE 1 CAPSULE BY MOUTH TWICE DAILY AS NEEDED FOR COUGH, Disp: 20 capsule, Rfl: 5 .  benzonatate (TESSALON) 200 MG capsule, Take 1 capsule (200 mg total) by mouth 2 (two) times daily as needed for cough.,  Disp: 20 capsule, Rfl: 0 .  cetirizine (ZYRTEC) 10 MG tablet, Take 1 tablet (10 mg total) by mouth daily., Disp: 30 tablet, Rfl: 11 .  cholecalciferol (VITAMIN D) 1000 units tablet, Take 2,000 Units by mouth daily. , Disp: , Rfl:  .  Coenzyme Q10 (COQ10) 100 MG CAPS, Take 100 mg by mouth daily. , Disp: , Rfl:  .  etanercept (ENBREL) 50 MG/ML injection, Inject 1 syringe into the skin once a week., Disp: 12 mL, Rfl: 0 .  levocetirizine (XYZAL) 5 MG tablet, Take 5 mg by mouth daily., Disp: , Rfl:  .  Magnesium Oxide (MAG-200) 200 MG TABS, Take 200 mg by mouth daily., Disp: , Rfl:  .  Melatonin 5 MG CAPS, Take 5 mg by mouth at bedtime. , Disp: , Rfl:  .  Omega-3 Fatty Acids (FISH OIL ULTRA) 1400 MG CAPS, Take 1,400 mg by mouth daily., Disp: , Rfl:  .  omeprazole (PRILOSEC) 20 MG capsule, Take 1 capsule (20 mg total) by mouth daily., Disp: 30 capsule, Rfl: 0 .  TURMERIC PO, Take 15 mLs by mouth daily. , Disp: , Rfl:    Garner Nash, DO [facility] 08/22/2019 11:56 AM

## 2019-08-22 NOTE — Patient Instructions (Addendum)
Thank you for visiting Dr. Valeta Harms at [facility]. Today we recommend the following:  Orders Placed This Encounter  Procedures  . CT CHEST HIGH RESOLUTION   Please stay on eliquis   Return in about 10 weeks (around 10/31/2019) for with APP or Dr. Valeta Harms.    Please do your part to reduce the spread of COVID-19.

## 2019-08-23 ENCOUNTER — Other Ambulatory Visit: Payer: Self-pay

## 2019-08-23 ENCOUNTER — Inpatient Hospital Stay: Payer: 59 | Attending: Oncology | Admitting: Oncology

## 2019-08-23 VITALS — BP 165/85 | HR 64 | Temp 98.0°F | Resp 20 | Ht 69.0 in | Wt 239.7 lb

## 2019-08-23 DIAGNOSIS — Z79899 Other long term (current) drug therapy: Secondary | ICD-10-CM | POA: Insufficient documentation

## 2019-08-23 DIAGNOSIS — Z7901 Long term (current) use of anticoagulants: Secondary | ICD-10-CM | POA: Insufficient documentation

## 2019-08-23 DIAGNOSIS — I82402 Acute embolism and thrombosis of unspecified deep veins of left lower extremity: Secondary | ICD-10-CM

## 2019-08-23 DIAGNOSIS — I2699 Other pulmonary embolism without acute cor pulmonale: Secondary | ICD-10-CM | POA: Insufficient documentation

## 2019-08-23 DIAGNOSIS — L4050 Arthropathic psoriasis, unspecified: Secondary | ICD-10-CM | POA: Insufficient documentation

## 2019-08-23 NOTE — Progress Notes (Signed)
Reason for the request:   Pulmonary embolism  HPI: I was asked by Lazaro Arms, NP   to evaluate [name] for pulmonary embolism.  He is a 54 year old man with history of psoriatic arthritis presented on July 19, 2019 with symptoms of dyspnea on exertion after recent COVID-19 infection.  On March 10 he tested positive but symptoms continued until his presentation.  He had complained of leg pain as well as shortness of breath at that time.  CT scan obtained on July 19, 2019 showed bilateral lobar and peripheral pulmonary artery thrombus associated with right heart strain.  Hamptons hump deformity in the lingula as well as the left base and the right upper lobe was also noted.  He was treated with heparin and subsequently was discharged on Eliquis.  He has tolerated Eliquis without any complications at this time.  His respiratory complaints has improved.  He still able to walk although limited by his psoriatic arthritis which has limited his work-related options.  He has been exercising periodically but not during winter months.  He does walk outside occasionally.  He does not report any headaches, blurry vision, syncope or seizures. Does not report any fevers, chills or sweats.  Does not report any cough, wheezing or hemoptysis.  Does not report any chest pain, palpitation, orthopnea or leg edema.  Does not report any nausea, vomiting or abdominal pain.  Does not report any constipation or diarrhea.  Does not report any skeletal complaints.    Does not report frequency, urgency or hematuria.  Does not report any skin rashes or lesions. Does not report any heat or cold intolerance.  Does not report any lymphadenopathy or petechiae.  Does not report any anxiety or depression.  Remaining review of systems is negative.    Past Medical History:  Diagnosis Date  . Anemia   . History of avascular necrosis of capital femoral epiphysis 2006, 2007   Bilateral Femoral Head  . HLD (hyperlipidemia)   .  Pneumonia    twice  . Psoriatic arthritis ([place])   :  Past Surgical History:  Procedure Laterality Date  . JOINT REPLACEMENT Bilateral 2006, 2007   Necrosis femoral head (bilateral)  . TOTAL HIP REVISION Right 11/23/2016   Procedure: Acetabulum liner and femoral head revision;  Surgeon: [name], MD;  Location: [facility];  Service: Orthopedics;  Laterality: Right;  . TOTAL HIP REVISION Left 04/10/2019   Procedure: Left hip bearing surface vs total hip arthroplasty revision;  Surgeon: [name], MD;  Location: [facility];  Service: Orthopedics;  Laterality: Left;  166min  :   Current Outpatient Medications:  .  acetaminophen (TYLENOL) 500 MG tablet, Take 1,000 mg by mouth every 8 (eight) hours as needed for mild pain or moderate pain. , Disp: , Rfl:  .  apixaban (ELIQUIS) 5 MG TABS tablet, Take 2 tablets (10 mg total) by mouth 2 (two) times daily., Disp: 26 tablet, Rfl: 0 .  apixaban (ELIQUIS) 5 MG TABS tablet, Take 1 tablet (5 mg total) by mouth 2 (two) times daily., Disp: 60 tablet, Rfl: 0 .  benzonatate (TESSALON) 100 MG capsule, TAKE 1 CAPSULE BY MOUTH TWICE DAILY AS NEEDED FOR COUGH, Disp: 20 capsule, Rfl: 5 .  benzonatate (TESSALON) 200 MG capsule, Take 1 capsule (200 mg total) by mouth 2 (two) times daily as needed for cough., Disp: 20 capsule, Rfl: 0 .  cetirizine (ZYRTEC) 10 MG tablet, Take 1 tablet (10 mg total) by mouth daily., Disp: 30 tablet, Rfl:  11 .  cholecalciferol (VITAMIN D) 1000 units tablet, Take 2,000 Units by mouth daily. , Disp: , Rfl:  .  Coenzyme Q10 (COQ10) 100 MG CAPS, Take 100 mg by mouth daily. , Disp: , Rfl:  .  etanercept (ENBREL) 50 MG/ML injection, Inject 1 syringe into the skin once a week., Disp: 12 mL, Rfl: 0 .  levocetirizine (XYZAL) 5 MG tablet, Take 5 mg by mouth daily., Disp: , Rfl:  .  Magnesium Oxide (MAG-200) 200 MG TABS, Take 200 mg by mouth daily., Disp: , Rfl:  .  Melatonin 5 MG CAPS, Take 5 mg by mouth at bedtime. , Disp: , Rfl:  .  Omega-3  Fatty Acids (FISH OIL ULTRA) 1400 MG CAPS, Take 1,400 mg by mouth daily., Disp: , Rfl:  .  omeprazole (PRILOSEC) 20 MG capsule, Take 1 capsule (20 mg total) by mouth daily., Disp: 30 capsule, Rfl: 0 .  TURMERIC PO, Take 15 mLs by mouth daily. , Disp: , Rfl: :  Allergies  Allergen Reactions  . Gold-Containing Drug Products Rash    Denies oral or airway involvement - occurred in the 1980s.   :  Family History  Problem Relation Age of Onset  . Pneumonia Father   . Tremor Neg Hx   :  Social History   Socioeconomic History  . Marital status: Married    Spouse name: Not on file  . Number of children: 1  . Years of education: Not on file  . Highest education level: Not on file  Occupational History  . Occupation: Disabled  Tobacco Use  . Smoking status: Never Smoker  . Smokeless tobacco: Never Used  Substance and Sexual Activity  . Alcohol use: Yes    Alcohol/week: 0.0 standard drinks    Comment: 3-4 drinks per week  . Drug use: No  . Sexual activity: Not on file    Comment: Married  Other Topics Concern  . Not on file  Social History Narrative   Lives at home w/ his wife   Right-handed   Caffeine: 2-3 cups per day   Social Determinants of Health   Financial Resource Strain:   . Difficulty of Paying Living Expenses:   Food Insecurity:   . Worried About Charity fundraiser in the Last Year:   . Arboriculturist in the Last Year:   Transportation Needs:   . Film/video editor (Medical):   [name] Lack of Transportation (Non-Medical):   Physical Activity:   . Days of Exercise per Week:   . Minutes of Exercise per Session:   Stress:   . Feeling of Stress :   Social Connections:   . Frequency of Communication with Friends and Family:   . Frequency of Social Gatherings with Friends and Family:   . Attends Religious Services:   . Active Member of Clubs or Organizations:   . Attends Archivist Meetings:   [name] Marital Status:   Intimate Partner Violence:   .  Fear of Current or Ex-Partner:   . Emotionally Abused:   [name] Physically Abused:   . Sexually Abused:   :  Pertinent items are noted in HPI.  Exam: Blood pressure (!) 165/85, pulse 64, temperature 98 F (36.7 C), temperature source Temporal, resp. rate 20, height 5\' 9"  (1.753 m), weight 239 lb 11.2 oz (108.7 kg), SpO2 99 %.  ECOG 1  General appearance: alert and cooperative appeared without distress. Head: atraumatic without any abnormalities. Eyes: conjunctivae/corneas clear. PERRL.  Sclera anicteric. Throat: lips, mucosa, and tongue normal; without oral thrush or ulcers. Resp: clear to auscultation bilaterally without rhonchi, wheezes or dullness to percussion. Cardio: regular rate and rhythm, S1, S2 normal, no murmur, click, rub or gallop GI: soft, non-tender; bowel sounds normal; no masses,  no organomegaly Skin: Skin color, texture, turgor normal. No rashes or lesions Lymph nodes: Cervical, supraclavicular, and axillary nodes normal. Neurologic: Grossly normal without any motor, sensory or deep tendon reflexes. Musculoskeletal: No joint deformity or effusion.   CBC    Component Value Date/Time   WBC 6.4 08/06/2019 1339   RBC 4.87 08/06/2019 1339   HGB 15.0 08/06/2019 1339   HGB 16.4 07/30/2019 0955   HCT 45.5 08/06/2019 1339   HCT 48.9 07/30/2019 0955   PLT 139 (L) 08/06/2019 1339   PLT 151 07/30/2019 0955   MCV 93.4 08/06/2019 1339   MCV 93 07/30/2019 0955   MCH 30.8 08/06/2019 1339   MCHC 33.0 08/06/2019 1339   RDW 13.3 08/06/2019 1339   RDW 13.2 07/30/2019 0955   LYMPHSABS 1,824 08/06/2019 1339   MONOABS 0.4 07/19/2019 2035   EOSABS 58 08/06/2019 1339   BASOSABS 32 08/06/2019 1339     Chemistry      Component Value Date/Time   NA 141 08/06/2019 1339   NA 139 07/30/2019 0955   K 4.3 08/06/2019 1339   CL 106 08/06/2019 1339   CO2 28 08/06/2019 1339   BUN 21 08/06/2019 1339   BUN 21 07/30/2019 0955   CREATININE 1.02 08/06/2019 1339      Component Value  Date/Time   CALCIUM 9.6 08/06/2019 1339   ALKPHOS 97 07/30/2019 0955   AST 19 08/06/2019 1339   ALT 34 08/06/2019 1339   BILITOT 0.5 08/06/2019 1339   BILITOT 0.5 07/30/2019 0955     IMPRESSION: Bilateral lobar and peripheral pulmonary arteries with right heart strain, the RV:LV ratio 1.2. Occlusive thrombus in the right upper lobar arteries.  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 Unenhanced CT chest recommended in 3 months.  A benign pericardial recess cyst, slightly increased in size since 2013. No additional surveillance imaging is recommended.  Borderline cardiomegaly with mild coronary calcification and pulmonary artery hypertension.  Trace bilateral pleural fluid.  Assessment and Plan:   54 year old with:  1.  Venous thromboembolism diagnosed in March 2021 in the setting of a COVID-19 infection.  He was found to have left acute deep vein thrombosis in the femoral vein, popliteal vein and posterior tibial veins among others.  He also had bilateral bulbar PE with ventricular strain.  He is currently anticoagulated with Eliquis.  The natural course of venous thromboembolism and risk factors were discussed at this time.  His thrombosis is clearly provoked with COVID-19 illness and immobilization leading up to it.  Inherited or acquired thrombophilia is considered unlikely in this particular setting.  The duration of anticoagulation was discussed at this time.  Minimum of 6 months anticoagulation given the extent of his thrombosis cardiac strain.  Anticoagulation beyond 6 months will be subject to risk and benefit analysis at that time.  It is certainly possible to make an argument for lifetime anticoagulation given the extent of his thrombosis and the risk of fall severe morbidity associated with recurrent thrombosis.    It is also reasonable to make an argument that with appropriate immunity from SARS-CoV-2 via natural infection as  well as subsequent immunization that his risk of contracting severe illness would  be low and risk of recurrent thrombosis would be low as well.  I have recommended that reevaluation in 6 months and discussing the risks and benefits at that time.  2.  COVID-19 vaccination: I urged him to pursue vaccination despite his recent infection to ensure adequate immunity to subsequent exposure.  3.  Follow-up: Will be in 6 months for repeat follow-up.  45 minutes were dedicated to this visit. The time was spent on reviewing laboratory data, imaging studies, discussing treatment options, and answering questions regarding future plan.     A copy of this consult has been forwarded to the requesting physician.

## 2019-08-26 ENCOUNTER — Telehealth: Payer: Self-pay | Admitting: Oncology

## 2019-08-26 NOTE — Telephone Encounter (Signed)
Scheduled appt per 4/23 los.  Left a vm of the appt date and time. 

## 2019-08-29 DIAGNOSIS — Z471 Aftercare following joint replacement surgery: Secondary | ICD-10-CM | POA: Diagnosis not present

## 2019-08-29 DIAGNOSIS — Z96642 Presence of left artificial hip joint: Secondary | ICD-10-CM | POA: Diagnosis not present

## 2019-09-06 DIAGNOSIS — Z96642 Presence of left artificial hip joint: Secondary | ICD-10-CM | POA: Diagnosis not present

## 2019-09-09 ENCOUNTER — Other Ambulatory Visit: Payer: Self-pay | Admitting: Nurse Practitioner

## 2019-09-13 ENCOUNTER — Other Ambulatory Visit: Payer: Self-pay | Admitting: Nurse Practitioner

## 2019-09-13 MED ORDER — BENZONATATE 200 MG PO CAPS: 200.0000 mg | ORAL_CAPSULE | Freq: Two times a day (BID) | ORAL | 0 refills | Status: DC | PRN

## 2019-09-16 MED FILL — ENBREL 50 MG/ML SOSY: 50 | 28 days supply | Qty: 4 | Fill #1

## 2019-09-17 NOTE — Telephone Encounter (Signed)
Dr. Icard - please advise. Thanks. 

## 2019-09-20 ENCOUNTER — Other Ambulatory Visit: Payer: Self-pay | Admitting: Nurse Practitioner

## 2019-09-20 MED ORDER — APIXABAN 5 MG PO TABS: 5.0000 mg | ORAL_TABLET | Freq: Two times a day (BID) | ORAL | 0 refills | Status: DC

## 2019-09-20 MED FILL — ELIQUIS 5 MG TABLET: 5 | 30 days supply | Qty: 60 | Fill #0

## 2019-10-01 ENCOUNTER — Other Ambulatory Visit: Payer: Self-pay | Admitting: Nurse Practitioner

## 2019-10-01 MED ORDER — BENZONATATE 200 MG PO CAPS: 200.0000 mg | ORAL_CAPSULE | Freq: Two times a day (BID) | ORAL | 0 refills | Status: DC | PRN

## 2019-10-01 MED FILL — BENZONATATE 200 MG CAP: 200 | 10 days supply | Qty: 20 | Fill #0

## 2019-10-15 MED FILL — ENBREL 50 MG/ML SOSY: 50 | 28 days supply | Qty: 4 | Fill #2

## 2019-10-21 ENCOUNTER — Other Ambulatory Visit: Payer: Self-pay

## 2019-10-21 ENCOUNTER — Other Ambulatory Visit: Payer: Self-pay | Admitting: Nurse Practitioner

## 2019-10-21 ENCOUNTER — Ambulatory Visit (INDEPENDENT_AMBULATORY_CARE_PROVIDER_SITE_OTHER)
Admission: RE | Admit: 2019-10-21 | Discharge: 2019-10-21 | Disposition: A | Discharge status: Home / Self Care | Payer: 59 | Source: Ambulatory Visit | Attending: Pulmonary Disease | Admitting: Pulmonary Disease

## 2019-10-21 DIAGNOSIS — J984 Other disorders of lung: Secondary | ICD-10-CM | POA: Diagnosis not present

## 2019-10-21 DIAGNOSIS — R0602 Shortness of breath: Secondary | ICD-10-CM | POA: Diagnosis not present

## 2019-10-21 MED ORDER — APIXABAN 5 MG PO TABS: 5.0000 mg | ORAL_TABLET | Freq: Two times a day (BID) | ORAL | 0 refills | Status: DC

## 2019-10-21 MED ORDER — BENZONATATE 200 MG PO CAPS: 200.0000 mg | ORAL_CAPSULE | Freq: Two times a day (BID) | ORAL | 0 refills | Status: DC | PRN

## 2019-10-21 MED FILL — ELIQUIS 5 MG TABLET: 5 | 30 days supply | Qty: 60 | Fill #0

## 2019-10-21 MED FILL — BENZONATATE 200 MG CAP: 200 | 10 days supply | Qty: 20 | Fill #0

## 2019-10-31 ENCOUNTER — Ambulatory Visit (INDEPENDENT_AMBULATORY_CARE_PROVIDER_SITE_OTHER): Payer: 59 | Admitting: Primary Care

## 2019-10-31 ENCOUNTER — Encounter: Payer: Self-pay | Admitting: Primary Care

## 2019-10-31 ENCOUNTER — Other Ambulatory Visit: Payer: Self-pay

## 2019-10-31 VITALS — BP 124/72 | HR 71 | Temp 97.3°F | Ht 69.0 in | Wt 234.6 lb

## 2019-10-31 DIAGNOSIS — R05 Cough: Secondary | ICD-10-CM | POA: Diagnosis not present

## 2019-10-31 DIAGNOSIS — I2699 Other pulmonary embolism without acute cor pulmonale: Secondary | ICD-10-CM | POA: Diagnosis not present

## 2019-10-31 DIAGNOSIS — R0601 Orthopnea: Secondary | ICD-10-CM

## 2019-10-31 DIAGNOSIS — R059 Cough, unspecified: Secondary | ICD-10-CM

## 2019-10-31 MED ORDER — BENZONATATE 200 MG PO CAPS: 200.0000 mg | ORAL_CAPSULE | Freq: Two times a day (BID) | ORAL | 1 refills | Status: DC | PRN

## 2019-10-31 MED FILL — BENZONATATE 200 MG CAP: 200 | 30 days supply | Qty: 60 | Fill #0

## 2019-10-31 NOTE — Patient Instructions (Addendum)
Recommend: Resume prilosec 20mg  daily x 4-6 weeks Refilling tessalon perles Continue to work on weight loss efforts  Orders: Echocardiogram in 2-4 weeks  Re: pulmonary embolism   Referral: Cardiology - orthopnea/ lower extremity swelling  Follow-up: Dr. Valeta Harms in 3-4 months or sooner if needed    Food Choices for Gastroesophageal Reflux Disease, Adult When you have gastroesophageal reflux disease (GERD), the foods you eat and your eating habits are very important. Choosing the right foods can help ease your discomfort. Think about working with a nutrition specialist (dietitian) to help you make good choices. What are tips for following this plan?  Meals  Choose healthy foods that are low in fat, such as fruits, vegetables, whole grains, low-fat dairy products, and lean meat, fish, and poultry.  Eat small meals often instead of 3 large meals a day. Eat your meals slowly, and in a place where you are relaxed. Avoid bending over or lying down until 2-3 hours after eating.  Avoid eating meals 2-3 hours before bed.  Avoid drinking a lot of liquid with meals.  Cook foods using methods other than frying. Bake, grill, or broil food instead.  Avoid or limit: ? Chocolate. ? Peppermint or spearmint. ? Alcohol. ? Pepper. ? Black and decaffeinated coffee. ? Black and decaffeinated tea. ? Bubbly (carbonated) soft drinks. ? Caffeinated energy drinks and soft drinks.  Limit high-fat foods such as: ? Fatty meat or fried foods. ? Whole milk, cream, butter, or ice cream. ? Nuts and nut butters. ? Pastries, donuts, and sweets made with butter or shortening.  Avoid foods that cause symptoms. These foods may be different for everyone. Common foods that cause symptoms include: ? Tomatoes. ? Oranges, lemons, and limes. ? Peppers. ? Spicy food. ? Onions and garlic. ? Vinegar. Lifestyle  Maintain a healthy weight. Ask your doctor what weight is healthy for you. If you need to lose  weight, work with your doctor to do so safely.  Exercise for at least 30 minutes for 5 or more days each week, or as told by your doctor.  Wear loose-fitting clothes.  Do not smoke. If you need help quitting, ask your doctor.  Sleep with the head of your bed higher than your feet. Use a wedge under the mattress or blocks under the bed frame to raise the head of the bed. Summary  When you have gastroesophageal reflux disease (GERD), food and lifestyle choices are very important in easing your symptoms.  Eat small meals often instead of 3 large meals a day. Eat your meals slowly, and in a place where you are relaxed.  Limit high-fat foods such as fatty meat or fried foods.  Avoid bending over or lying down until 2-3 hours after eating.  Avoid peppermint and spearmint, caffeine, alcohol, and chocolate. This information is not intended to replace advice given to you by your health care provider. Make sure you discuss any questions you have with your health care provider. Document Revised: 08/09/2018 Document Reviewed: 05/24/2016 Elsevier Patient Education  [place].

## 2019-10-31 NOTE — Progress Notes (Signed)
@Patient  ID: [name], male    DOB: 05-29-1965, 54 y.o.   MRN: [id number]  Chief Complaint  Patient presents with  . Follow-up    Hx of Covid-19, doing better, some SOB at night    Referring provider: Eulas Post, MD  HPI: 54 year old male, never smoked. Patient of Dr. Valeta Harms, seen for initial consult on 08/22/19 post covid.   Previous [facility] pulmonary encounters: 08/22/2019-Dr. Icard, consult This 54 year old gentleman past medical history of psoriatic arthritis, hyperlipidemia femoral avascular necrosis.  Patient had COVID-19 07/10/2019.  Patient was treated at home.  However 07/19/2019 patient presented to the emergency department with increasing fatigue shortness of breath and left leg swelling.  Patient had CT scan of the chest with concern of right ventricular strain and pulmonary embolism.  Patient was treated with Decadron, remdesivir.  Admitted to the hospital also started on anticoagulation.  Patient was discharged from the hospital on 07/24/2019 with Eliquis.  H&P and discharge summary by Dr. Sloan Leiter was reviewed.  OV 08/22/2019: Patient seen today in the office.  No complaints from a respiratory standpoint.  He does have occasional swelling in his legs.  He is able to complete most of his activities of daily living without any trouble.  He thinks his shortness of breath is at baseline.  He feels much better than he did immediately after his hospitalization overall doing well.  Patient denies hemoptysis.  Denies blood loss.  Patient to return to clinic to review CT imaging. If there is still parenchymal evidence of inflammation related to Covid infection could consider work-up for ILD after repeat CT.  Would prefer to hold off on obtaining pulmonary function test until patient has had time to recover from acute PE and remain on anticoagulation.  Patient will need to continue anticoagulation for a minimum of 6 months.  He did have a submassive PE.  We will have to discuss the  risk-benefit of coming off.  We briefly discussed this today and he seemed content with staying on anticoagulation if there was no contraindication.   10/31/2019- interim hx Patient had Covid-19 in March 7628 complicated by large PE with CT evidence of RV strain and LLE DVT. During this time he was being followed with the respiratory clinic at [facility]. He was referred to hematology to determine appropriate duration of anticoagulation. Referred to pulmonary and saw Dr. Valeta Harms for initial consult.   He presents today for follow-up office visit. Accompanied by his wife. He states that he is doing better but still has some residual shortness of breath and cough mainly at night. HRCT showed no evidenc of post covid fibrosis or ILD. He had his second covid vaccine 4 days ago. His cough has slightly increased since his getting vaccine. He is taking [employer] which helps. He had been taking omeprazole but stopped. He only has shortness of breath at night. His wife reports lower extremity swelling for several months-years. Continues Eliquis 5mg  twice daily, plan minimum 6 months treatment - he may need lifelong. He has a follow-up with hematology scheduled for October. Denies fever, sweats, chest, tightness or chest pain.   RecentSignificantEncounters:  3/19>> admit to [facility] for large PE with CT evidence of RV strain and LLE DVT- teated with remdesivir and decadron. DVTprophylaxis- IV heparin>>Eliquis 3/15>>[id number]-[id number] RV systolic function. EF 60-65% 3/21>> transferred to [facility] for IR evaluation for catheter guided lytic therapy 07/30/19>>Post Covid Care Visit - treated for cough, Referral placed to hematology to determine duration of anticoagulation for  DVT/PE, Referral placed to pulmonary for hampton's hump, nocturnal bradycardia, and PAH.   Imaging: CTA 07/19/19: Bilateral lobar and peripheral pulmonary arteries with right heart strain, the RV:LV ratio 1.2. Occlusive thrombus in the right upper lobar  arteries. 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 Unenhanced CT chest recommended in 3 months. A benign pericardial recess cyst, slightly increased in size since 2013. No additional surveillance imaging is recommended. Borderline cardiomegaly with mild coronary calcification and pulmonary artery hypertension. Trace bilateral pleural fluid.   Allergies  Allergen Reactions  . Gold-Containing Drug Products Rash    Denies oral or airway involvement - occurred in the 1980s.     Immunization History  Administered Date(s) Administered  . Influenza Inj Mdck Quad Pf 02/01/2019  . Influenza,inj,Quad PF,6+ Mos 02/03/2017, 03/21/2018  . Influenza,inj,quad, With Preservative 02/01/2019  . Influenza-Unspecified 02/03/2017, 03/21/2018, 02/01/2019  . PFIZER SARS-COV-2 Vaccination 10/06/2019, 10/27/2019  . Pneumococcal Conjugate-13 03/21/2018  . Tdap 09/22/2014  . Zoster 04/02/2018, 05/23/2018  . Zoster Recombinat (Shingrix) 04/02/2018, 05/23/2018    Past Medical History:  Diagnosis Date  . Anemia   . History of avascular necrosis of capital femoral epiphysis 2006, 2007   Bilateral Femoral Head  . HLD (hyperlipidemia)   . Pneumonia    twice  . Psoriatic arthritis ([place])     Tobacco History: Social History   Tobacco Use  Smoking Status Never Smoker  Smokeless Tobacco Never Used   Counseling given: Not Answered   Outpatient Medications Prior to Visit  Medication Sig Dispense Refill  . acetaminophen (TYLENOL) 500 MG tablet Take 1,000 mg by mouth every 8 (eight) hours as needed for mild pain or moderate pain.     [name] apixaban (ELIQUIS) 5 MG TABS tablet Take 1 tablet (5 mg total) by mouth 2 (two) times daily. 60 tablet 0  . benzonatate (TESSALON) 100 MG capsule TAKE 1 CAPSULE BY MOUTH TWICE DAILY AS NEEDED FOR COUGH 20 capsule 5  . cetirizine (ZYRTEC) 10 MG tablet Take 1 tablet (10 mg total) by mouth daily. 30 tablet 11  . cholecalciferol (VITAMIN D)  1000 units tablet Take 2,000 Units by mouth daily.     . Coenzyme Q10 (COQ10) 100 MG CAPS Take 100 mg by mouth daily.     [name] etanercept (ENBREL) 50 MG/ML injection Inject 1 syringe into the skin once a week. 12 mL 0  . levocetirizine (XYZAL) 5 MG tablet Take 5 mg by mouth daily.    . Magnesium Oxide (MAG-200) 200 MG TABS Take 200 mg by mouth daily.    . Melatonin 5 MG CAPS Take 5 mg by mouth at bedtime.     . Omega-3 Fatty Acids (FISH OIL ULTRA) 1400 MG CAPS Take 1,400 mg by mouth daily.    . TURMERIC PO Take 15 mLs by mouth daily.     [name] apixaban (ELIQUIS) 5 MG TABS tablet Take 2 tablets (10 mg total) by mouth 2 (two) times daily. 26 tablet 0  . benzonatate (TESSALON) 200 MG capsule Take 1 capsule (200 mg total) by mouth 2 (two) times daily as needed for cough. 20 capsule 0  . omeprazole (PRILOSEC) 20 MG capsule Take 1 capsule (20 mg total) by mouth daily. 30 capsule 0   No facility-administered medications prior to visit.   Review of Systems  Review of Systems  Constitutional: Negative.   Respiratory: Positive for cough. Negative for chest tightness, shortness of breath and wheezing.  Orthopnea  Cardiovascular: Positive for leg swelling.    Physical Exam  BP 124/72 (BP Location: Left Arm, Cuff Size: Large)   Pulse 71   Temp (!) 97.3 F (36.3 C) (Oral)   Ht 5\' 9"  (1.753 m)   Wt 234 lb 9.6 oz (106.4 kg)   SpO2 94%   BMI 34.64 kg/m  Physical Exam Constitutional:      Appearance: Normal appearance.  HENT:     Head: Normocephalic and atraumatic.     Mouth/Throat:     Mouth: Mucous membranes are moist.     Pharynx: Oropharynx is clear.  Cardiovascular:     Rate and Rhythm: Normal rate and regular rhythm.     Comments: RRR; Pedal edema Pulmonary:     Effort: Pulmonary effort is normal.     Breath sounds: Normal breath sounds. No wheezing.     Comments: CTA Musculoskeletal:        General: Normal range of motion.  Skin:    General: Skin is warm and dry.    Neurological:     General: No focal deficit present.     Mental Status: He is alert and oriented to person, place, and time. Mental status is at baseline.  Psychiatric:        Mood and Affect: Mood normal.        Behavior: Behavior normal.        Thought Content: Thought content normal.        Judgment: Judgment normal.      Lab Results:  CBC    Component Value Date/Time   WBC 6.4 08/06/2019 1339   RBC 4.87 08/06/2019 1339   HGB 15.0 08/06/2019 1339   HGB 16.4 07/30/2019 0955   HCT 45.5 08/06/2019 1339   HCT 48.9 07/30/2019 0955   PLT 139 (L) 08/06/2019 1339   PLT 151 07/30/2019 0955   MCV 93.4 08/06/2019 1339   MCV 93 07/30/2019 0955   MCH 30.8 08/06/2019 1339   MCHC 33.0 08/06/2019 1339   RDW 13.3 08/06/2019 1339   RDW 13.2 07/30/2019 0955   LYMPHSABS 1,824 08/06/2019 1339   MONOABS 0.4 07/19/2019 2035   EOSABS 58 08/06/2019 1339   BASOSABS 32 08/06/2019 1339    BMET    Component Value Date/Time   NA 141 08/06/2019 1339   NA 139 07/30/2019 0955   K 4.3 08/06/2019 1339   CL 106 08/06/2019 1339   CO2 28 08/06/2019 1339   GLUCOSE 96 08/06/2019 1339   BUN 21 08/06/2019 1339   BUN 21 07/30/2019 0955   CREATININE 1.02 08/06/2019 1339   CALCIUM 9.6 08/06/2019 1339   GFRNONAA 82 08/06/2019 1339   GFRAA 95 08/06/2019 1339    BNP    Component Value Date/Time   BNP 20.1 07/19/2019 2140    ProBNP No results found for: PROBNP  Imaging: CT CHEST HIGH RESOLUTION  Result Date: 10/21/2019 CLINICAL DATA:  Shortness of breath, history of COVID infection, PE and possible infarct. Persistent shortness of breath at night and mild cough. EXAM: CT CHEST WITHOUT CONTRAST TECHNIQUE: Multidetector CT imaging of the chest was performed following the standard protocol without intravenous contrast. High resolution imaging of the lungs, as well as inspiratory and expiratory imaging, was performed. COMPARISON:  07/19/2019. FINDINGS: Cardiovascular: Calcified ligamentum  arteriosum. Coronary artery calcification. Heart size normal. No pericardial effusion. Fluid density mass along the right pericardial margin measures 3.4 x 3.9 cm, indicative of a pericardial cyst. Mediastinum/Nodes: No pathologically enlarged mediastinal or axillary  lymph nodes. Hilar regions are difficult to definitively evaluate without IV contrast but appear grossly unremarkable. Esophagus is grossly unremarkable. Lungs/Pleura: Scattered scarring in the right middle lobe, lingula and both lower lobes. Negative for subpleural reticulation, traction bronchiectasis/bronchiolectasis, ground-glass, architectural distortion. No pleural fluid. Airway is unremarkable. No air trapping. Upper Abdomen: Visualized portions of the liver, gallbladder and adrenal glands are unremarkable. Low-attenuation lesion in the upper pole right kidney measures 4.1 cm and is likely a cyst although incompletely imaged. Visualized portions of the left kidney, spleen, pancreas, stomach and bowel are grossly unremarkable. Musculoskeletal: Degenerative changes in the spine. No worrisome lytic or sclerotic lesions. IMPRESSION: 1. No evidence of post COVID-19 inflammatory fibrosis or other interstitial lung disease. 2. Coronary artery calcification. Electronically Signed   By: Lorin Picket M.D.   On: 10/21/2019 13:07     Assessment & Plan:   Acute pulmonary embolism ([place]) - Large PE with CT evidence of RV strain in March 2021  - Continue Eliquis 5mg  twice daily - Needs minimum 6 month treatment - Following with hematology, he has a follow up scheduled for October 2021 - Needs repeat echocardiogram   Orthopnea - Patient reports orthopnea and associated LE swelling - Refer to cardiology for evaluation   Cough - Patient hospitalized with COVID in March 2021 treated with Remdesivir and decadron  - HRCT showed no evidence of covid-19 fibrosis or ILD - Continue tessalon perles 200mg  TID prn cough - Recommend resuming omeprazole  20mg  daily x 4-6 weeks and follow GERD diet   Martyn Ehrich, NP 11/01/2019

## 2019-11-01 DIAGNOSIS — R0601 Orthopnea: Secondary | ICD-10-CM | POA: Insufficient documentation

## 2019-11-01 NOTE — Assessment & Plan Note (Addendum)
-   Patient hospitalized with COVID in March 2021 treated with Remdesivir and decadron  - HRCT showed no evidence of covid-19 fibrosis or ILD - Continue tessalon perles 200mg  TID prn cough - Recommend resuming omeprazole 20mg  daily x 4-6 weeks and follow GERD diet

## 2019-11-01 NOTE — Progress Notes (Signed)
Office Visit Note  Patient: [name]             Date of Birth: [birth year]           MRN: [id number]             PCP: Eulas Post, MD Referring: Eulas Post, MD Visit Date: 11/13/2019 Occupation: @GUAROCC @  Subjective:  Medication monitoring   History of Present Illness: RAQUEL SAYRES is a 54 y.o. male with history of psoriatic arthritis and osteoarthritis.  He is on enbrel 50 mg sq injections once weekly.  He reports he held enbrel in March while having covid-19 infection.  He also postponed the dose of enbrel while receiving the covid-19 vaccinations. He denies any recent psoriatic arthritis flares.  He denies any joint pain or joint swelling at this time.  He denies any psoriasis currently.  He denies any achilles tendonitis or plantar fasciitis.  He denies any SI joint pain.  He stretches his lower back daily.   Activities of Daily Living:  Patient reports morning stiffness for a few  minutes.   Patient Denies nocturnal pain.  Difficulty dressing/grooming: Denies Difficulty climbing stairs: Denies Difficulty getting out of chair: Reports Difficulty using hands for taps, buttons, cutlery, and/or writing: Denies  Review of Systems  Constitutional: Positive for fatigue.  HENT: Negative for mouth sores, mouth dryness and nose dryness.   Eyes: Negative for itching and dryness.  Respiratory: Positive for difficulty breathing. Negative for shortness of breath.   Cardiovascular: Negative for chest pain and palpitations.  Gastrointestinal: Negative for blood in stool, constipation and diarrhea.  Endocrine: Negative for increased urination.  Genitourinary: Negative for difficulty urinating.  Musculoskeletal: Positive for morning stiffness. Negative for arthralgias, joint pain, joint swelling, myalgias, muscle tenderness and myalgias.  Skin: Negative for color change, rash and redness.  Allergic/Immunologic: Negative for susceptible to infections.  Neurological:  Negative for dizziness, numbness, headaches, memory loss and weakness.  Hematological: Positive for bruising/bleeding tendency.  Psychiatric/Behavioral: Negative for confusion.    PMFS History:  Patient Active Problem List   Diagnosis Date Noted  . Orthopnea 11/01/2019  . History of COVID-19 07/30/2019  . Acute deep vein thrombosis (DVT) of left lower extremity ([place]) 07/30/2019  . Abnormal CT of the chest 07/30/2019  . Cough 07/30/2019  . Leg DVT (deep venous thromboembolism), acute, bilateral ([place]) 07/24/2019  . HTN (hypertension) 07/24/2019  . COVID-19 virus infection 07/20/2019  . Left leg swelling 07/20/2019  . Acute respiratory failure with hypoxia ([place]) 07/20/2019  . Acute pulmonary embolism ([place]) 07/19/2019  . Essential tremor 04/03/2017  . Elevated blood pressure reading 02/03/2017  . Failed total hip arthroplasty ([place]) 11/23/2016  . High risk medications (not anticoagulants) long-term use 09/20/2016  . Bilateral hand pain 09/20/2016  . DDD (degenerative disc disease), cervical 09/20/2016  . History of total hip replacement, bilateral 09/20/2016  . DDD (degenerative disc disease), lumbar 09/20/2016  . Bilateral plantar fasciitis 09/20/2016  . Psoriasis 09/20/2016  . Primary osteoarthritis of both feet 09/20/2016  . Primary osteoarthritis of both hands 09/20/2016  . Primary osteoarthritis of both knees 09/20/2016  . Prediabetes 09/22/2014  . Hyperlipidemia 09/22/2014  . URI (upper respiratory infection) 07/07/2012  . Acute bronchitis 07/07/2012  . HYPERLIPIDEMIA 07/03/2008  . OBESITY 07/03/2008  . AVASCULAR NECROSIS, FEMORAL HEAD 07/03/2008  . Psoriatic arthritis ([place]) 05/02/2001    Past Medical History:  Diagnosis Date  . Anemia   . History of avascular necrosis of capital femoral epiphysis  2006, 2007   Bilateral Femoral Head  . HLD (hyperlipidemia)   . Pneumonia    twice  . Psoriatic arthritis ([place])     Family History  Problem Relation Age of Onset  .  Pneumonia Father   . Tremor Neg Hx    Past Surgical History:  Procedure Laterality Date  . JOINT REPLACEMENT Bilateral 2006, 2007   Necrosis femoral head (bilateral)  . TOTAL HIP REVISION Right 11/23/2016   Procedure: Acetabulum liner and femoral head revision;  Surgeon: [name], MD;  Location: [facility];  Service: Orthopedics;  Laterality: Right;  . TOTAL HIP REVISION Left 04/10/2019   Procedure: Left hip bearing surface vs total hip arthroplasty revision;  Surgeon: [name], MD;  Location: [facility];  Service: Orthopedics;  Laterality: Left;  113min   Social History   Social History Narrative   Lives at home w/ his wife   Right-handed   Caffeine: 2-3 cups per day   Immunization History  Administered Date(s) Administered  . Influenza Inj Mdck Quad Pf 02/01/2019  . Influenza,inj,Quad PF,6+ Mos 02/03/2017, 03/21/2018  . Influenza,inj,quad, With Preservative 02/01/2019  . Influenza-Unspecified 02/03/2017, 03/21/2018, 02/01/2019  . PFIZER SARS-COV-2 Vaccination 10/06/2019, 10/27/2019  . Pneumococcal Conjugate-13 03/21/2018  . Tdap 09/22/2014  . Zoster 04/02/2018, 05/23/2018  . Zoster Recombinat (Shingrix) 04/02/2018, 05/23/2018     Objective: Vital Signs: BP (!) 156/90 (BP Location: Left Arm, Patient Position: Sitting, Cuff Size: Normal)   Pulse 60   Resp 16   Ht 5\' 9"  (1.753 m)   Wt 236 lb 3.2 oz (107.1 kg)   BMI 34.88 kg/m    Physical Exam Vitals and nursing note reviewed.  Constitutional:      Appearance: He is well-developed.  HENT:     Head: Normocephalic and atraumatic.  Eyes:     Conjunctiva/sclera: Conjunctivae normal.     Pupils: Pupils are equal, round, and reactive to light.  Pulmonary:     Effort: Pulmonary effort is normal.  Abdominal:     General: Bowel sounds are normal.     Palpations: Abdomen is soft.  Musculoskeletal:     Cervical back: Normal range of motion and neck supple.  Skin:    General: Skin is warm and dry.     Capillary Refill:  Capillary refill takes less than 2 seconds.  Neurological:     Mental Status: He is alert and oriented to person, place, and time.  Psychiatric:        Behavior: Behavior normal.      Musculoskeletal Exam: C-spine good ROM.  Thoracic and lumbar spine good ROM.  No midline spinal tenderness.  No SI joint tenderness.  Limited ROM of both wrist joints.  Thickening of both wrist joints, MCPs, and PIP joints but no synovitis noted. Contracture of left 4th PIP joint. Hip joints good ROM with no discomfort.  Knee joints good ROM with no warmth or effusion.  Ankle joints good ROM with no tenderness or inflammation.  No achilles tendonitis or plantar fasciitis.   CDAI Exam: CDAI Score: -- Patient Global: --; Provider Global: -- Swollen: --; Tender: -- Joint Exam 11/13/2019   No joint exam has been documented for this visit   There is currently no information documented on the homunculus. Go to the Rheumatology activity and complete the homunculus joint exam.  Investigation: No additional findings.  Imaging: CT CHEST HIGH RESOLUTION  Result Date: 10/21/2019 CLINICAL DATA:  Shortness of breath, history of COVID infection, PE and possible infarct. Persistent  shortness of breath at night and mild cough. EXAM: CT CHEST WITHOUT CONTRAST TECHNIQUE: Multidetector CT imaging of the chest was performed following the standard protocol without intravenous contrast. High resolution imaging of the lungs, as well as inspiratory and expiratory imaging, was performed. COMPARISON:  07/19/2019. FINDINGS: Cardiovascular: Calcified ligamentum arteriosum. Coronary artery calcification. Heart size normal. No pericardial effusion. Fluid density mass along the right pericardial margin measures 3.4 x 3.9 cm, indicative of a pericardial cyst. Mediastinum/Nodes: No pathologically enlarged mediastinal or axillary lymph nodes. Hilar regions are difficult to definitively evaluate without IV contrast but appear grossly  unremarkable. Esophagus is grossly unremarkable. Lungs/Pleura: Scattered scarring in the right middle lobe, lingula and both lower lobes. Negative for subpleural reticulation, traction bronchiectasis/bronchiolectasis, ground-glass, architectural distortion. No pleural fluid. Airway is unremarkable. No air trapping. Upper Abdomen: Visualized portions of the liver, gallbladder and adrenal glands are unremarkable. Low-attenuation lesion in the upper pole right kidney measures 4.1 cm and is likely a cyst although incompletely imaged. Visualized portions of the left kidney, spleen, pancreas, stomach and bowel are grossly unremarkable. Musculoskeletal: Degenerative changes in the spine. No worrisome lytic or sclerotic lesions. IMPRESSION: 1. No evidence of post COVID-19 inflammatory fibrosis or other interstitial lung disease. 2. Coronary artery calcification. Electronically Signed   By: Lorin Picket M.D.   On: 10/21/2019 13:07    Recent Labs: Lab Results  Component Value Date   WBC 6.4 08/06/2019   HGB 15.0 08/06/2019   PLT 139 (L) 08/06/2019   NA 141 08/06/2019   K 4.3 08/06/2019   CL 106 08/06/2019   CO2 28 08/06/2019   GLUCOSE 96 08/06/2019   BUN 21 08/06/2019   CREATININE 1.02 08/06/2019   BILITOT 0.5 08/06/2019   ALKPHOS 97 07/30/2019   AST 19 08/06/2019   ALT 34 08/06/2019   PROT 6.9 08/06/2019   ALBUMIN 4.6 07/30/2019   CALCIUM 9.6 08/06/2019   GFRAA 95 08/06/2019   QFTBGOLDPLUS NEGATIVE 01/04/2019    Speciality Comments: No specialty comments available.  Procedures:  No procedures performed Allergies: Gold-containing drug products   Assessment / Plan:     Visit Diagnoses: Psoriatic arthritis ([place]) - He has not had any signs or symptoms of a psoriatic arthritis flare.  He has no synovitis or dacylitis on exam.  He is not experiencing any achilles tendonitis or plantar fasciitis.  No SI joint tenderness was noted on exam.  X-rays of both hands and feet were updated today to  assess for interval change.  He will continue on Enbrel 50 mg sq injections once weekly. He has not missed any doses recently.  He has received both covid-19 vaccines (following the covid-19 infection). He will continue on enbrel 50 mg sq injections once weekly as prescribed.  He was advised to notify us if he develops increased joint pain or joint swelling.  He will follow up in 5 months. Plan: XR Hand 2 View Right, XR Hand 2 View Left, XR Foot 2 Views Right, XR Foot 2 Views Left  Psoriasis: He has no psoriasis at this time.   High risk medication use - Enbrel 50 mg sq injections every 7 days. CBC and CMP were drawn on 08/06/19.  plts were 139 at that time.  He is due to update CBC and CMP today.  Orders were released.  He will be due for labs next in October and every 3 months to monitor for drug toxicity.   TB gold negative on 01/04/19.  Future order was placed today.- Plan:  CBC with Differential/Platelet, COMPLETE METABOLIC PANEL WITH GFR, QuantiFERON-TB Gold Plus  Primary osteoarthritis of both hands: He has PIP thickening consistent with osteoarthritis of both hands.  No inflammation was noted.  He has complete fist formation bilaterally.  Joint protection and muscle strengthening were discussed.   History of total hip replacement, bilateral: He has good ROM with no discomfort.   Primary osteoarthritis of both knees: He has good ROM with no warmth or effusion.   Primary osteoarthritis of both feet:  He is not experiencing any discomfort in his feet at this time.   DDD (degenerative disc disease), cervical: He has good ROM with no discomfort.  No symptoms of radiculopathy.    DDD (degenerative disc disease), lumbar: He has good ROM with no discomfort.  No midline spinal tenderness.  No symptoms of radiculopathy.    Other fatigue: Stable.   History of vitamin D deficiency: He is taking vitamin D 2,000 units daily.    Other medical conditions are listed as follows:   History of  prediabetes  History of hypertension  History of hyperlipidemia  Orders: Orders Placed This Encounter  Procedures  . XR Hand 2 View Right  . XR Hand 2 View Left  . XR Foot 2 Views Right  . XR Foot 2 Views Left  . CBC with Differential/Platelet  . COMPLETE METABOLIC PANEL WITH GFR  . QuantiFERON-TB Gold Plus   No orders of the defined types were placed in this encounter.    Follow-Up Instructions: Return in about 5 months (around 04/14/2020) for Psoriatic arthritis, Osteoarthritis.   Ofilia Neas, PA-C  Note - This record has been created using Dragon software.  Chart creation errors have been sought, but may not always  have been located. Such creation errors do not reflect on  the standard of medical care.

## 2019-11-01 NOTE — Assessment & Plan Note (Signed)
-   Patient reports orthopnea and associated LE swelling - Refer to cardiology for evaluation

## 2019-11-01 NOTE — Assessment & Plan Note (Signed)
-   Large PE with CT evidence of RV strain in March 2021  - Continue Eliquis 5mg  twice daily - Needs minimum 6 month treatment - Following with hematology, he has a follow up scheduled for October 2021 - Needs repeat echocardiogram

## 2019-11-03 NOTE — Progress Notes (Signed)
PCCM: Thanks for seeing him. Agree with plan  Garner Nash, DO [facility] 11/03/2019 11:27 AM

## 2019-11-06 ENCOUNTER — Ambulatory Visit: Payer: 59 | Admitting: Rheumatology

## 2019-11-07 ENCOUNTER — Other Ambulatory Visit: Payer: Self-pay | Admitting: Pharmacist

## 2019-11-07 ENCOUNTER — Other Ambulatory Visit: Payer: Self-pay | Admitting: Rheumatology

## 2019-11-07 MED ORDER — ENBREL 50 MG/ML ~~LOC~~ SOSY: PREFILLED_SYRINGE | SUBCUTANEOUS | 0 refills | Status: DC

## 2019-11-07 NOTE — Telephone Encounter (Signed)
Last Visit: 06/11/19 Next Visit: 11/13/19 Labs: 08/06/19 CMP is normal, CBC shows low platelets which are stable. TB Gold: 01/04/19 Neg   Patient advised he is due to update labs. Patient states he will update at his upcoming appointment.  Okay to refill 30 day supply Enbrel?

## 2019-11-13 ENCOUNTER — Ambulatory Visit (INDEPENDENT_AMBULATORY_CARE_PROVIDER_SITE_OTHER): Payer: 59 | Admitting: Physician Assistant

## 2019-11-13 ENCOUNTER — Ambulatory Visit: Payer: Self-pay

## 2019-11-13 ENCOUNTER — Encounter: Payer: Self-pay | Admitting: Physician Assistant

## 2019-11-13 ENCOUNTER — Other Ambulatory Visit: Payer: Self-pay

## 2019-11-13 VITALS — BP 156/90 | HR 60 | Resp 16 | Ht 69.0 in | Wt 236.2 lb

## 2019-11-13 DIAGNOSIS — Z96643 Presence of artificial hip joint, bilateral: Secondary | ICD-10-CM

## 2019-11-13 DIAGNOSIS — M170 Bilateral primary osteoarthritis of knee: Secondary | ICD-10-CM

## 2019-11-13 DIAGNOSIS — M5030 Other cervical disc degeneration, unspecified cervical region: Secondary | ICD-10-CM | POA: Diagnosis not present

## 2019-11-13 DIAGNOSIS — M19072 Primary osteoarthritis, left ankle and foot: Secondary | ICD-10-CM | POA: Diagnosis not present

## 2019-11-13 DIAGNOSIS — M19071 Primary osteoarthritis, right ankle and foot: Secondary | ICD-10-CM

## 2019-11-13 DIAGNOSIS — L4050 Arthropathic psoriasis, unspecified: Secondary | ICD-10-CM | POA: Diagnosis not present

## 2019-11-13 DIAGNOSIS — Z79899 Other long term (current) drug therapy: Secondary | ICD-10-CM

## 2019-11-13 DIAGNOSIS — R5383 Other fatigue: Secondary | ICD-10-CM

## 2019-11-13 DIAGNOSIS — Z8679 Personal history of other diseases of the circulatory system: Secondary | ICD-10-CM

## 2019-11-13 DIAGNOSIS — Z8639 Personal history of other endocrine, nutritional and metabolic disease: Secondary | ICD-10-CM

## 2019-11-13 DIAGNOSIS — L409 Psoriasis, unspecified: Secondary | ICD-10-CM | POA: Diagnosis not present

## 2019-11-13 DIAGNOSIS — M19041 Primary osteoarthritis, right hand: Secondary | ICD-10-CM | POA: Diagnosis not present

## 2019-11-13 DIAGNOSIS — M5136 Other intervertebral disc degeneration, lumbar region: Secondary | ICD-10-CM | POA: Diagnosis not present

## 2019-11-13 DIAGNOSIS — M19042 Primary osteoarthritis, left hand: Secondary | ICD-10-CM | POA: Diagnosis not present

## 2019-11-13 DIAGNOSIS — Z87898 Personal history of other specified conditions: Secondary | ICD-10-CM

## 2019-11-13 MED FILL — ENBREL 50 MG/ML SOSY: 50 | 28 days supply | Qty: 4 | Fill #0

## 2019-11-13 NOTE — Patient Instructions (Signed)
Standing Labs We placed an order today for your standing lab work.   Please have your standing labs drawn in October and every 3 months  If possible, please have your labs drawn 2 weeks prior to your appointment so that the provider can discuss your results at your appointment.  We have open lab daily Monday through Thursday from 8:30-12:30 PM and 1:30-4:30 PM and Friday from 8:30-12:30 PM and 1:30-4:00 PM at the office of Dr. Shaili Deveshwar, [facility].   Please be advised, patients with office appointments requiring lab work will take precedents over walk-in lab work.  If possible, please come for your lab work on Monday and Friday afternoons, as you may experience shorter wait times. The office is located at [address], [address], [place], [place] [address] No appointment is necessary.   Labs are drawn by [facility]. Please bring your co-pay at the time of your lab draw.  You may receive a bill from [facility] for your lab work.  If you wish to have your labs drawn at another location, please call the office 24 hours in advance to send orders.  If you have any questions regarding directions or hours of operation,  please call [phone].   As a reminder, please drink plenty of water prior to coming for your lab work. Thanks!  

## 2019-11-14 NOTE — Progress Notes (Signed)
Platelets are borderline low-137.  Rest of CBC WNL.  Please notify the patient and forward lab work to PCP.

## 2019-11-25 ENCOUNTER — Other Ambulatory Visit: Payer: Self-pay

## 2019-11-25 ENCOUNTER — Ambulatory Visit (HOSPITAL_COMMUNITY): Payer: 59 | Attending: Internal Medicine

## 2019-11-25 DIAGNOSIS — R0601 Orthopnea: Secondary | ICD-10-CM | POA: Diagnosis not present

## 2019-11-25 DIAGNOSIS — R600 Localized edema: Secondary | ICD-10-CM

## 2019-11-25 DIAGNOSIS — I2699 Other pulmonary embolism without acute cor pulmonale: Secondary | ICD-10-CM | POA: Diagnosis not present

## 2019-11-25 DIAGNOSIS — I2609 Other pulmonary embolism with acute cor pulmonale: Secondary | ICD-10-CM

## 2019-11-25 LAB — ECHOCARDIOGRAM COMPLETE
Area-P 1/2: 2.42 cm2
S' Lateral: 3.2 cm

## 2019-11-26 ENCOUNTER — Encounter: Payer: Self-pay | Admitting: Internal Medicine

## 2019-11-26 ENCOUNTER — Ambulatory Visit (INDEPENDENT_AMBULATORY_CARE_PROVIDER_SITE_OTHER): Payer: 59 | Admitting: Internal Medicine

## 2019-11-26 VITALS — BP 148/86 | HR 57 | Ht 69.0 in | Wt 235.4 lb

## 2019-11-26 DIAGNOSIS — I2609 Other pulmonary embolism with acute cor pulmonale: Secondary | ICD-10-CM

## 2019-11-26 DIAGNOSIS — M7989 Other specified soft tissue disorders: Secondary | ICD-10-CM

## 2019-11-26 DIAGNOSIS — I10 Essential (primary) hypertension: Secondary | ICD-10-CM | POA: Diagnosis not present

## 2019-11-26 DIAGNOSIS — Z79899 Other long term (current) drug therapy: Secondary | ICD-10-CM

## 2019-11-26 DIAGNOSIS — I82403 Acute embolism and thrombosis of unspecified deep veins of lower extremity, bilateral: Secondary | ICD-10-CM

## 2019-11-26 DIAGNOSIS — E785 Hyperlipidemia, unspecified: Secondary | ICD-10-CM

## 2019-11-26 DIAGNOSIS — R001 Bradycardia, unspecified: Secondary | ICD-10-CM

## 2019-11-26 MED ORDER — APIXABAN 5 MG PO TABS: 5.0000 mg | ORAL_TABLET | Freq: Two times a day (BID) | ORAL | 1 refills | Status: DC

## 2019-11-26 MED FILL — ELIQUIS 5 MG TABLET: 5 | 30 days supply | Qty: 60 | Fill #0

## 2019-11-26 NOTE — Patient Instructions (Signed)
Medication Instructions:  Your Physician recommend you continue on your current medication as directed.   *If you need a refill on your cardiac medications before your next appointment, please call your pharmacy*   Lab Work: None    Testing/Procedures: None   Follow-Up: At [facility], you and your health needs are our priority.  As part of our continuing mission to provide you with exceptional heart care, we have created designated Provider Care Teams.  These Care Teams include your primary Cardiologist (physician) and Advanced Practice Providers (APPs -  Physician Assistants and Nurse Practitioners) who all work together to provide you with the care you need, when you need it.  We recommend signing up for the patient portal called "MyChart".  Sign up information is provided on this After Visit Summary.  MyChart is used to connect with patients for Virtual Visits (Telemedicine).  Patients are able to view lab/test results, encounter notes, upcoming appointments, etc.  Non-urgent messages can be sent to your provider as well.   To learn more about what you can do with MyChart, go to [email or web address].    Your next appointment:   6 month(s)  The format for your next appointment:   In Person  Provider:   Gayatri Acharya, MD     

## 2019-11-26 NOTE — Progress Notes (Signed)
Cardiology Office Note:    Date:  11/26/2019   ID:  Kalyb, Pemble 1966-02-25, MRN [id number]  PCP:  Eulas Post, MD  Cardiologist:  Elouise Munroe, MD  Electrophysiologist:  None   Referring MD: Martyn Ehrich, NP   Chief Complaint: leg swelling, bradycardia.   History of Present Illness:    [name] is a 54 y.o. 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 He was hospitalized with bilateral PE with RV strain. He was started on Eliquis and is intended to continue this for at least 6 months. He is nearly out of Eliquis and is requesting a new prescription.   He presents primarily for leg swelling that occurred after COVID 19 hospitalization. Both his echocardiogram from 07/20/19 and echocardiogram yesterday show grossly normal right heart function and size, and preserved LV function. He had an acute DVT in left femoral vein. Notes mild orthopnea and leg swelling.   The patient denies chest pain, chest pressure, dyspnea at rest or with exertion, palpitations, PND. Denies cough, fever, chills. Denies nausea, vomiting. Denies syncope or presyncope. Denies dizziness or lightheadedness. Denies snoring.   Past Medical History:  Diagnosis Date   Anemia    History of avascular necrosis of capital femoral epiphysis 2006, 2007   Bilateral Femoral Head   HLD (hyperlipidemia)    Pneumonia    twice   Psoriatic arthritis [facility])     Past Surgical History:  Procedure Laterality Date   JOINT REPLACEMENT Bilateral 2006, 2007   Necrosis femoral head (bilateral)   TOTAL HIP REVISION Right 11/23/2016   Procedure: Acetabulum liner and femoral head revision;  Surgeon: [name], MD;  Location: [facility];  Service: Orthopedics;  Laterality: Right;   TOTAL HIP REVISION Left 04/10/2019   Procedure: Left hip bearing surface vs total hip arthroplasty revision;   Surgeon: [name], MD;  Location: [facility];  Service: Orthopedics;  Laterality: Left;  160min    Current Medications: Current Meds  Medication Sig   acetaminophen (TYLENOL) 500 MG tablet Take 1,000 mg by mouth every 8 (eight) hours as needed for mild pain or moderate pain.    apixaban (ELIQUIS) 5 MG TABS tablet Take 1 tablet (5 mg total) by mouth 2 (two) times daily.   benzonatate (TESSALON) 200 MG capsule Take 1 capsule (200 mg total) by mouth 2 (two) times daily as needed for cough.   cetirizine (ZYRTEC) 10 MG tablet Take 1 tablet (10 mg total) by mouth daily.   cholecalciferol (VITAMIN D) 1000 units tablet Take 2,000 Units by mouth daily.    Coenzyme Q10 (COQ10) 100 MG CAPS Take 100 mg by mouth daily.    etanercept (ENBREL) 50 MG/ML injection Inject 1 syringe into the skin once a week.   Magnesium Oxide (MAG-200) 200 MG TABS Take 200 mg by mouth daily.   Melatonin 5 MG CAPS Take 5 mg by mouth at bedtime as needed (sleep.).    Omega-3 Fatty Acids (FISH OIL ULTRA) 1400 MG CAPS Take 1,400 mg by mouth daily.   omeprazole (PRILOSEC) 20 MG capsule Take 1 capsule (20 mg total) by mouth daily.   TURMERIC PO Take 15 mLs by mouth daily.      Allergies:   Gold-containing drug products   Social History   Socioeconomic History   Marital status: Married    Spouse name: Not on file   Number of children:  1   Years of education: Not on file   Highest education level: Not on file  Occupational History   Occupation: Disabled  Tobacco Use   Smoking status: Never Smoker   Smokeless tobacco: Never Used  Vaping Use   Vaping Use: Never used  Substance and Sexual Activity   Alcohol use: Yes    Alcohol/week: 0.0 standard drinks    Comment: 3-4 drinks per week   Drug use: No   Sexual activity: Not on file    Comment: Married  Other Topics Concern   Not on file  Social History Narrative   Lives at home w/ his wife   Right-handed   Caffeine: 2-3 cups per day    Social Determinants of Health   Financial Resource Strain:    Difficulty of Paying Living Expenses:   Food Insecurity:    Worried About Charity fundraiser in the Last Year:    Arboriculturist in the Last Year:   Transportation Needs:    Film/video editor (Medical):    Lack of Transportation (Non-Medical):   Physical Activity:    Days of Exercise per Week:    Minutes of Exercise per Session:   Stress:    Feeling of Stress :   Social Connections:    Frequency of Communication with Friends and Family:    Frequency of Social Gatherings with Friends and Family:    Attends Religious Services:    Active Member of Clubs or Organizations:    Attends Archivist Meetings:    Marital Status:      Family History: The patient's family history includes Pneumonia in his father. There is no history of Tremor.  ROS:   Please see the history of present illness.    All other systems reviewed and are negative.  EKGs/Labs/Other Studies Reviewed:    The following studies were reviewed today:  EKG:  Sinus bradycardia 57 bpm  Recent Labs: 07/19/2019: B Natriuretic Peptide 20.1 07/21/2019: Magnesium 2.0 07/23/2019: TSH 1.493 08/06/2019: ALT 34; BUN 21; Creat 1.02; Potassium 4.3; Sodium 141 11/13/2019: Hemoglobin 15.7; Platelets 137  Recent Lipid Panel    Component Value Date/Time   CHOL 206 (H) 03/21/2018 1029   TRIG 132 07/19/2019 2035   HDL 56.10 03/21/2018 1029   CHOLHDL 4 03/21/2018 1029   VLDL 16.8 03/21/2018 1029   LDLCALC 133 (H) 03/21/2018 1029   LDLDIRECT 133.7 06/24/2008 0857    Physical Exam:    VS:  BP (!) 148/86    Pulse 57    Ht 5\' 9"  (1.753 m)    Wt (!) 235 lb 6.4 oz (106.8 kg)    SpO2 96%    BMI 34.76 kg/m     Wt Readings from Last 5 Encounters:  11/26/19 (!) 235 lb 6.4 oz (106.8 kg)  11/13/19 236 lb 3.2 oz (107.1 kg)  10/31/19 234 lb 9.6 oz (106.4 kg)  08/23/19 239 lb 11.2 oz (108.7 kg)  08/22/19 237 lb 3.2 oz (107.6 kg)      Constitutional: No acute distress Eyes: sclera non-icteric, normal conjunctiva and lids ENMT: normal dentition, moist mucous membranes Cardiovascular: regular rhythm, normal rate, no murmurs. S1 and S2 normal. Radial pulses normal bilaterally. No jugular venous distention.  Respiratory: clear to auscultation bilaterally GI : normal bowel sounds, soft and nontender. No distention.   MSK: extremities warm, well perfused. No edema.  NEURO: grossly nonfocal exam, moves all extremities. PSYCH: alert and oriented x 3, normal mood and affect.  ASSESSMENT:    1. Other acute pulmonary embolism with acute cor pulmonale (HCC)   2. Leg DVT (deep venous thromboembolism), acute, bilateral ([place])   3. Essential hypertension   4. Hyperlipidemia, unspecified hyperlipidemia type   5. Left leg swelling   6. Bradycardia    PLAN:    Acute pulmonary embolism with acute cor pulmonale (HCC) - Plan: EKG 12-Lead Leg DVT (deep venous thromboembolism), acute, bilateral (HCC) - no pulmonary symptoms at this time.  - Left leg swelling. We discussed continued AC and compression garments. 6 mo AC per pulmonary.  - I have reviewed his echocardiograms. No worrisome RV findings.  - he needs eliquis refilled, will send today.   Bradycardia - benign on ECG. No symptoms. Monitor clinically.   Essential hypertension - BP mildly elevated but not currently on therapy. Continue to monitor. If it remains elevated, will consider adding agent.   Hyperlipidemia, unspecified hyperlipidemia type - needs repeat lipid panel, not currently on therapy. Can have drawn with PCP, if elevated would recommend treatment for primary prevention of CAD.  Left leg swelling - discussed compression stockings, benefit in setting of DVT.  Cherlynn Kaiser, MD [place]   [facility]    Medication Adjustments/Labs and Tests Ordered: Current medicines are reviewed at length with the patient today.  Concerns regarding medicines are  outlined above.  Orders Placed This Encounter  Procedures   EKG 12-Lead   Meds ordered this encounter  Medications   apixaban (ELIQUIS) 5 MG TABS tablet    Sig: Take 1 tablet (5 mg total) by mouth 2 (two) times daily.    Dispense:  60 tablet    Refill:  1    Patient Instructions  Medication Instructions:  Your Physician recommend you continue on your current medication as directed.    *If you need a refill on your cardiac medications before your next appointment, please call your pharmacy*   Lab Work: None   Testing/Procedures: None   Follow-Up: At [facility], you and your health needs are our priority.  As part of our continuing mission to provide you with exceptional heart care, we have created designated Provider Care Teams.  These Care Teams include your primary Cardiologist (physician) and Advanced Practice Providers (APPs -  Physician Assistants and Nurse Practitioners) who all work together to provide you with the care you need, when you need it.  We recommend signing up for the patient portal called "MyChart".  Sign up information is provided on this After Visit Summary.  MyChart is used to connect with patients for Virtual Visits (Telemedicine).  Patients are able to view lab/test results, encounter notes, upcoming appointments, etc.  Non-urgent messages can be sent to your provider as well.   To learn more about what you can do with MyChart, go to [email or web address].    Your next appointment:   6 month(s)  The format for your next appointment:   In Person  Provider:   Cherlynn Kaiser, MD

## 2019-11-26 NOTE — Progress Notes (Signed)
Please let patient know Echocardiogram looked fine. He has an apt with cardiology today it looks like

## 2019-11-27 ENCOUNTER — Other Ambulatory Visit: Payer: Self-pay | Admitting: Primary Care

## 2019-11-27 MED ORDER — BENZONATATE 200 MG PO CAPS: 200.0000 mg | ORAL_CAPSULE | Freq: Two times a day (BID) | ORAL | 1 refills | Status: DC | PRN

## 2019-11-27 MED FILL — BENZONATATE 200 MG CAP: 200 | 30 days supply | Qty: 60 | Fill #0

## 2019-11-27 NOTE — Progress Notes (Signed)
Spoke with pt and notified of results per [name].  Pt verbalized understanding and denied any questions. 

## 2019-12-09 ENCOUNTER — Other Ambulatory Visit: Payer: Self-pay | Admitting: Physician Assistant

## 2019-12-09 NOTE — Telephone Encounter (Signed)
Last Visit: 11/13/2019 Next Visit: 04/15/2020 Labs: 11/13/2019 Platelets are borderline low-137. Rest of CBC WNL. TB Gold: 01/04/2019 Neg   Current Dose per office note on 11/13/2019: Enbrel 50 mg sq injections every 7 days DX: Psoriatic arthritis   Okay to refill Enbrel?

## 2019-12-10 ENCOUNTER — Other Ambulatory Visit: Payer: Self-pay | Admitting: Pharmacist

## 2019-12-10 MED ORDER — ENBREL 50 MG/ML ~~LOC~~ SOSY: PREFILLED_SYRINGE | SUBCUTANEOUS | 0 refills | Status: DC

## 2019-12-11 MED FILL — ENBREL 50 MG/ML SOSY: 50 | 28 days supply | Qty: 4 | Fill #0

## 2019-12-12 ENCOUNTER — Telehealth: Payer: Self-pay | Admitting: *Deleted

## 2019-12-12 NOTE — Telephone Encounter (Signed)
CBC resulted in computer. CMP from 11/13/2019 did not result in the computer. Results are normal. Reviewed by Dr. Estanislado Pandy

## 2019-12-19 LAB — CBC WITH DIFFERENTIAL/PLATELET
Absolute Monocytes: 258 cells/uL (ref 200–950)
Basophils Absolute: 18 cells/uL (ref 0–200)
Basophils Relative: 0.4 %
Eosinophils Absolute: 28 cells/uL (ref 15–500)
Eosinophils Relative: 0.6 %
HCT: 47.5 % (ref 38.5–50.0)
Hemoglobin: 15.7 g/dL (ref 13.2–17.1)
Lymphs Abs: 1743 cells/uL (ref 850–3900)
MCH: 31 pg (ref 27.0–33.0)
MCHC: 33.1 g/dL (ref 32.0–36.0)
MCV: 93.7 fL (ref 80.0–100.0)
MPV: 11.5 fL (ref 7.5–12.5)
Monocytes Relative: 5.6 %
Neutro Abs: 2553 cells/uL (ref 1500–7800)
Neutrophils Relative %: 55.5 %
Platelets: 137 10*3/uL — ABNORMAL LOW (ref 140–400)
RBC: 5.07 10*6/uL (ref 4.20–5.80)
RDW: 13.3 % (ref 11.0–15.0)
Total Lymphocyte: 37.9 %
WBC: 4.6 10*3/uL (ref 3.8–10.8)

## 2019-12-19 LAB — COMPLETE METABOLIC PANEL WITH GFR
AG Ratio: 1.6 (calc) (ref 1.0–2.5)
ALT: 30 U/L (ref 9–46)
AST: 21 U/L (ref 10–35)
Albumin: 4.4 g/dL (ref 3.6–5.1)
Alkaline phosphatase (APISO): 70 U/L (ref 35–144)
BUN: 20 mg/dL (ref 7–25)
CO2: 26 mmol/L (ref 20–32)
Calcium: 9.5 mg/dL (ref 8.6–10.3)
Chloride: 105 mmol/L (ref 98–110)
Creat: 1.09 mg/dL (ref 0.70–1.33)
GFR, Est African American: 87 mL/min/{1.73_m2} (ref >=60)
GFR, Est Non African American: 75 mL/min/{1.73_m2} (ref >=60)
Globulin: 2.8 g/dL (calc) (ref 1.9–3.7)
Glucose, Bld: 86 mg/dL (ref 65–99)
Potassium: 4.6 mmol/L (ref 3.5–5.3)
Sodium: 140 mmol/L (ref 135–146)
Total Bilirubin: 0.8 mg/dL (ref 0.2–1.2)
Total Protein: 7.2 g/dL (ref 6.1–8.1)

## 2019-12-20 ENCOUNTER — Telehealth: Payer: Self-pay | Admitting: Family Medicine

## 2019-12-20 NOTE — Telephone Encounter (Signed)
yes

## 2019-12-20 NOTE — Telephone Encounter (Signed)
Pt called to see if he can get a TOC to Dr. Ethlyn Gallery because his wife is a pt of her's  Please advise

## 2019-12-20 NOTE — Telephone Encounter (Signed)
Please advise 

## 2019-12-23 NOTE — Telephone Encounter (Signed)
Message sent to the scheduler as Dr Ethlyn Gallery is not accepting new patients.

## 2019-12-25 ENCOUNTER — Telehealth: Payer: Self-pay | Admitting: Pulmonary Disease

## 2019-12-25 NOTE — Telephone Encounter (Signed)
Called by [name] d/t pain in R calf which comes and goes. Reports walking 1000 steps this morning. Pain is really not worse with movement. He denies R calf swelling, redness or tightness of the calf. He is on Eliquis for a prior L leg DVT. He took Tylenol about 5 minutes ago. DDx: Muscle strain vs recurrent DVT on Eliquis which is less likely. Very difficult to properly diagnose this patient over the phone. Recommendations: 1. If pain improves with Tylenol, call the PCCM office in the morning for a follow-up appointment.If pain not relieved with Tylenol or if he develops increasing swelling, redness, increased pain, tightness of calf or SOB, he should go to the Emergency Department for further evaluation and possible hospital admission.

## 2019-12-26 ENCOUNTER — Telehealth: Payer: Self-pay | Admitting: Pulmonary Disease

## 2019-12-26 MED FILL — ELIQUIS 5 MG TABLET: 5 | 30 days supply | Qty: 60 | Fill #1

## 2019-12-26 NOTE — Telephone Encounter (Signed)
Is he still having the issue? Looks like the pain has resolve.  I suggest for him just to observe. Unlikely to be related to clot if he has been taking his eliquis appropriately.  Is symptoms persist he should be seen either by our clinic or pcp.  Garner Nash, DO [facility] 12/26/2019 11:01 AM

## 2019-12-26 NOTE — Telephone Encounter (Signed)
Called and spoke with patient about Dr. Fabio Bering recs. Told patient to let us know if he needs anything. Patient expressed understanding. Nothing further needed at this time.     Garner Nash, DO to Me      12/26/19 11:01 AM Note Is he still having the issue? Looks like the pain has resolve.  I suggest for him just to observe. Unlikely to be related to clot if he has been taking his eliquis appropriately.  Is symptoms persist he should be seen either by our clinic or pcp.  Garner Nash, DO [facility] 12/26/2019 11:01 AM

## 2019-12-26 NOTE — Telephone Encounter (Signed)
Called and spoke with patient about Dr. Fabio Bering recs. Told patient to let us know if he needs anything. Patient expressed understanding. Nothing further needed at this time.

## 2019-12-26 NOTE — Telephone Encounter (Signed)
Primary Pulmonologist: Dr. Valeta Harms Last office visit and with whom: 10/31/19 [name] What do we see them for (pulmonary problems): COvid, cough Last OV assessment/plan: See Below  Was appointment offered to patient (explain)?  No   Reason for call: Spoke with doctor on call from our office after hours. Last night around 8pm felt pain that would come and go in right calf. Didn't get swollen or hot to touch. Patient is disabled and has psoriatic arthritis but has been trying to move around more. Was told that since he is on Eliquis it was unlikely a clot. Took shower and it feels better.   Dr. Valeta Harms please advise- [name] is out of the office    (examples of things to ask: : When did symptoms start? Fever? Cough? Productive? Color to sputum? More sputum than usual? Wheezing? Have you needed increased oxygen? Are you taking your respiratory medications? What over the counter measures have you tried?)  Allergies  Allergen Reactions  . Gold-Containing Drug Products Rash    Denies oral or airway involvement - occurred in the 1980s.     Immunization History  Administered Date(s) Administered  . Influenza Inj Mdck Quad Pf 02/01/2019  . Influenza,inj,Quad PF,6+ Mos 02/03/2017, 03/21/2018  . Influenza,inj,quad, With Preservative 02/01/2019  . Influenza-Unspecified 02/03/2017, 03/21/2018, 02/01/2019  . PFIZER SARS-COV-2 Vaccination 10/06/2019, 10/27/2019  . Pneumococcal Conjugate-13 03/21/2018  . Tdap 09/22/2014  . Zoster 04/02/2018, 05/23/2018  . Zoster Recombinat (Shingrix) 04/02/2018, 05/23/2018   Assessment & Plan:   Acute pulmonary embolism ([place]) - Large PE with CT evidence of RV strain in March 2021  - Continue Eliquis 5mg  twice daily - Needs minimum 6 month treatment - Following with hematology, he has a follow up scheduled for October 2021 - Needs repeat echocardiogram   Orthopnea - Patient reports orthopnea and associated LE swelling - Refer to cardiology for evaluation    Cough - Patient hospitalized with COVID in March 2021 treated with Remdesivir and decadron  - HRCT showed no evidence of covid-19 fibrosis or ILD - Continue tessalon perles 200mg  TID prn cough - Recommend resuming omeprazole 20mg  daily x 4-6 weeks and follow GERD diet   Joshua Ehrich, NP 11/01/2019

## 2020-01-08 MED FILL — ENBREL 50 MG/ML SOSY: 50 | 28 days supply | Qty: 4 | Fill #1

## 2020-01-15 ENCOUNTER — Ambulatory Visit (INDEPENDENT_AMBULATORY_CARE_PROVIDER_SITE_OTHER): Payer: 59 | Admitting: Podiatry

## 2020-01-15 ENCOUNTER — Other Ambulatory Visit: Payer: Self-pay | Admitting: Primary Care

## 2020-01-15 ENCOUNTER — Other Ambulatory Visit: Payer: Self-pay

## 2020-01-15 ENCOUNTER — Encounter: Payer: Self-pay | Admitting: Podiatry

## 2020-01-15 DIAGNOSIS — M2042 Other hammer toe(s) (acquired), left foot: Secondary | ICD-10-CM | POA: Diagnosis not present

## 2020-01-15 DIAGNOSIS — Z79899 Other long term (current) drug therapy: Secondary | ICD-10-CM | POA: Diagnosis not present

## 2020-01-15 DIAGNOSIS — L84 Corns and callosities: Secondary | ICD-10-CM

## 2020-01-15 DIAGNOSIS — M2041 Other hammer toe(s) (acquired), right foot: Secondary | ICD-10-CM | POA: Diagnosis not present

## 2020-01-15 MED ORDER — PREDNISONE 10 MG PO TABS
ORAL_TABLET | ORAL | 0 refills | Status: DC
Start: 2020-01-15 — End: 2020-03-20

## 2020-01-15 MED ORDER — BENZONATATE 200 MG PO CAPS: 200.0000 mg | ORAL_CAPSULE | Freq: Two times a day (BID) | ORAL | 5 refills | Status: DC | PRN

## 2020-01-15 MED ORDER — PREDNISONE 10 MG PO TABS
ORAL_TABLET | ORAL | 0 refills | Status: DC
Start: 2020-01-15 — End: 2020-01-15

## 2020-01-15 MED FILL — BENZONATATE 200 MG CAP: 200 | 30 days supply | Qty: 60 | Fill #0

## 2020-01-15 MED FILL — predniSONE 10 MG TABS: 10 | 15 days supply | Qty: 32 | Fill #0

## 2020-01-15 NOTE — Telephone Encounter (Signed)
We can try a taper of prednisone (40mg  x 3 days; 30mg  x 3 days; 20mg  x 3 days; 10mg  x 3 days; 5mg  x 3 days; THEN STOP

## 2020-01-15 NOTE — Telephone Encounter (Signed)
Please advise on patient mychart message  Hi   I am still troubled by a cough, especially in the morning hours. Had Covid in March.  I went through 3 packs of Prilosec as well as the 200 mg Benzonate caps. I'm not sure what to do. Please advise.  [name]

## 2020-01-15 NOTE — Progress Notes (Signed)
This patient presents the office with chief complaint of painful callus on his right foot.  Patient states that there is a painful callus on the tip of the third and fifth toes right foot.  He says these are painful walking and wearing his shoes.  He also has a callus on his big toe right foot.  He presents the office today for an evaluation and treatment of these painful callus.  Patient is presently taking Eliquis.  Vascular  Dorsalis pedis  Are weakly  palpable  B/L. Posterior tibial pulses are absent  B/L. Capillary return  WNL.  Temperature gradient is  WNL.  Skin turgor  WNL  Sensorium  Senn Weinstein monofilament wire  WNL. Normal tactile sensation.  Nail Exam  Patient has normal nails with no evidence of bacterial or fungal infection with the exception right hallux which is thick and deformed.  Orthopedic  Exam  Muscle tone and muscle strength  WNL.  No limitations of motion feet  B/L.  No crepitus or joint effusion noted.  Retrocalcaneal exostosis right foot.  Severely rigid hammer toes 2-5  B/L  Skin  No open lesions.  Normal skin texture and turgor. Callus distal aspect 3rd toe right.  Listers corn fifth toe right.  Pinch callus hallux  Right. Black skin lesion 3rd toe left asymptomatic.    Callus secondary to hammer toes  B/L.    IE.  Debride callus with # 15 blade followed by dremel use.  Padding dispensed.  RTC prn.   Gardiner Barefoot DPM

## 2020-01-15 NOTE — Telephone Encounter (Signed)
Patient called with dosing instructions for pred taper. Patient verbalized understanding. Prescription sent to preferred pharmacy.

## 2020-01-28 ENCOUNTER — Other Ambulatory Visit: Payer: Self-pay | Admitting: Internal Medicine

## 2020-01-28 ENCOUNTER — Other Ambulatory Visit: Payer: Self-pay

## 2020-01-28 MED ORDER — APIXABAN 5 MG PO TABS
5.0000 mg | ORAL_TABLET | Freq: Two times a day (BID) | ORAL | 1 refills | Status: DC
Start: 2020-01-28 — End: 2020-07-22

## 2020-01-28 MED FILL — ELIQUIS 5 MG TABLET: 5 | 90 days supply | Qty: 180 | Fill #0

## 2020-02-05 MED FILL — ENBREL 50 MG/ML SOSY: 50 | 28 days supply | Qty: 4 | Fill #2

## 2020-02-09 IMAGING — DX DG PORTABLE PELVIS
2 series · 2 of 2 positions shown · non-contrast
Comparison: 11/23/2016

CLINICAL DATA: Revision of left total hip arthroplasty, likely
acetabular liner and femoral head revision.

EXAM:
PORTABLE PELVIS 1-2 VIEWS

[pelvis ap (1 of 2)]
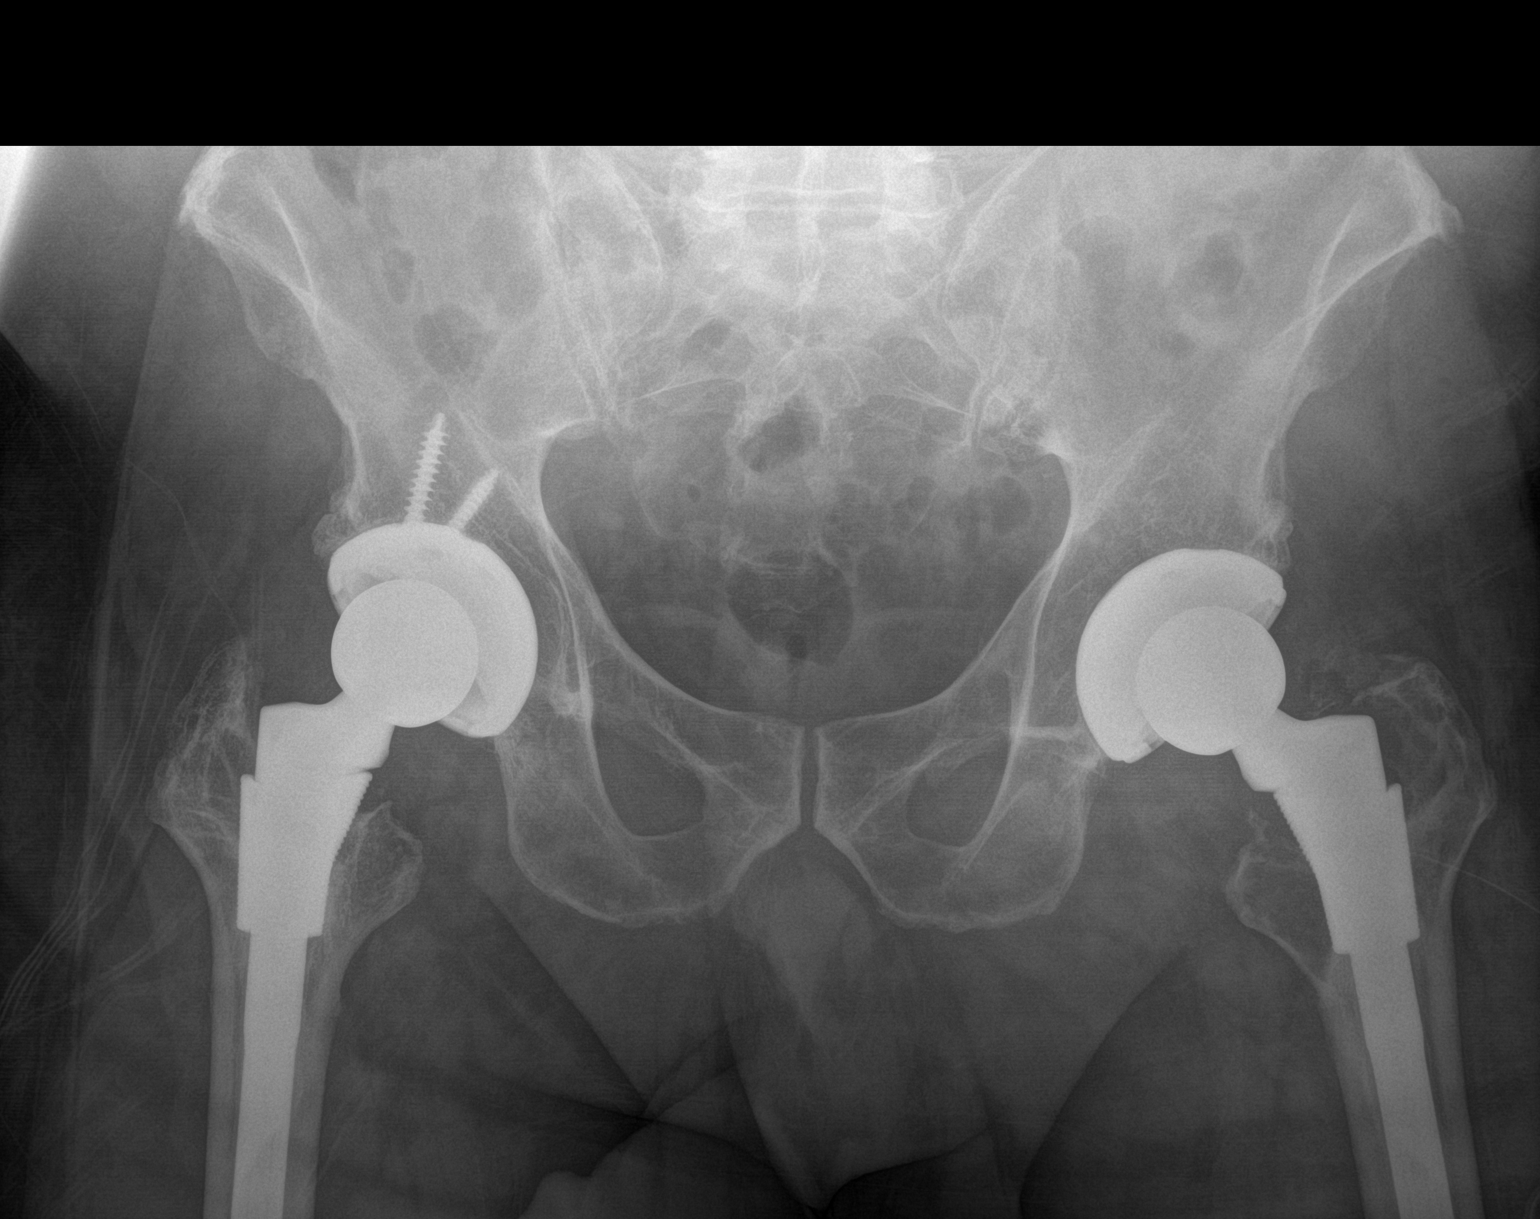

[pelvis ap (2 of 2)]
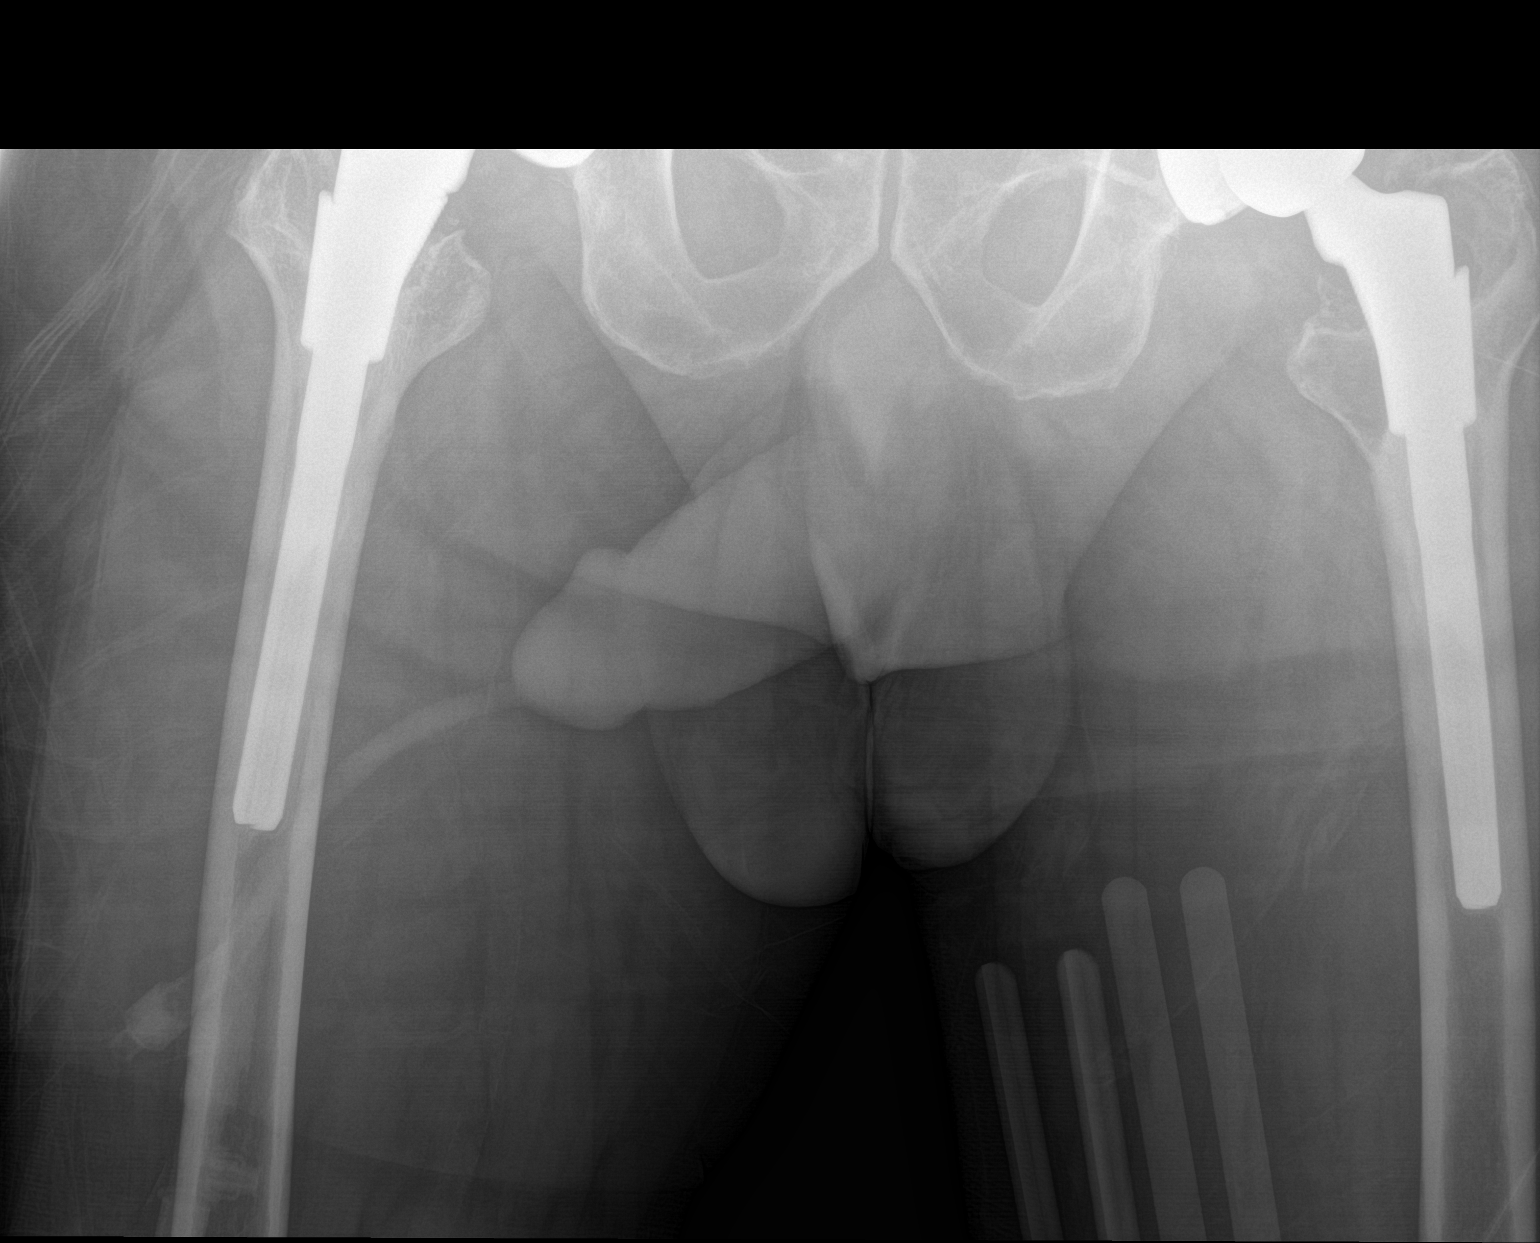

[2 of 2 positions shown; findings below may reference images not displayed]

FINDINGS: Bilateral hip prostheses are intact. No findings for periprosthetic
fracture. Stable mild lucency around the upper femoral components,
likely chronic particle disease. The bony pelvis is intact. The
pubic symphysis and SI joints appear normal.
IMPRESSION: 1. Intact bilateral hip prostheses.
2. Stable mild lucency around the upper femoral components, likely
chronic particle disease.
3. No acute bony findings.

## 2020-02-13 ENCOUNTER — Other Ambulatory Visit: Payer: Self-pay | Admitting: *Deleted

## 2020-02-13 DIAGNOSIS — Z79899 Other long term (current) drug therapy: Secondary | ICD-10-CM | POA: Diagnosis not present

## 2020-02-14 NOTE — Progress Notes (Signed)
CBC and CMP are normal except mild thrombocytopenia noted.  We will continue to monitor.  His platelet count has been fluctuating over the years.

## 2020-02-15 LAB — CBC WITH DIFFERENTIAL/PLATELET
Absolute Monocytes: 259 cells/uL (ref 200–950)
Basophils Absolute: 22 cells/uL (ref 0–200)
Basophils Relative: 0.4 %
Eosinophils Absolute: 39 cells/uL (ref 15–500)
Eosinophils Relative: 0.7 %
HCT: 47.2 % (ref 38.5–50.0)
Hemoglobin: 15.9 g/dL (ref 13.2–17.1)
Lymphs Abs: 1628 cells/uL (ref 850–3900)
MCH: 31.9 pg (ref 27.0–33.0)
MCHC: 33.7 g/dL (ref 32.0–36.0)
MCV: 94.6 fL (ref 80.0–100.0)
MPV: 11.8 fL (ref 7.5–12.5)
Monocytes Relative: 4.7 %
Neutro Abs: 3553 cells/uL (ref 1500–7800)
Neutrophils Relative %: 64.6 %
Platelets: 131 10*3/uL — ABNORMAL LOW (ref 140–400)
RBC: 4.99 10*6/uL (ref 4.20–5.80)
RDW: 13.4 % (ref 11.0–15.0)
Total Lymphocyte: 29.6 %
WBC: 5.5 10*3/uL (ref 3.8–10.8)

## 2020-02-15 LAB — COMPLETE METABOLIC PANEL WITH GFR
AG Ratio: 1.7 (calc) (ref 1.0–2.5)
ALT: 24 U/L (ref 9–46)
AST: 20 U/L (ref 10–35)
Albumin: 4.2 g/dL (ref 3.6–5.1)
Alkaline phosphatase (APISO): 62 U/L (ref 35–144)
BUN/Creatinine Ratio: 28 (calc) — ABNORMAL HIGH (ref 6–22)
BUN: 26 mg/dL — ABNORMAL HIGH (ref 7–25)
CO2: 28 mmol/L (ref 20–32)
Calcium: 9.5 mg/dL (ref 8.6–10.3)
Chloride: 105 mmol/L (ref 98–110)
Creat: 0.92 mg/dL (ref 0.70–1.33)
GFR, Est African American: 107 mL/min/{1.73_m2} (ref >=60)
GFR, Est Non African American: 93 mL/min/{1.73_m2} (ref >=60)
Globulin: 2.5 g/dL (calc) (ref 1.9–3.7)
Glucose, Bld: 99 mg/dL (ref 65–99)
Potassium: 4.6 mmol/L (ref 3.5–5.3)
Sodium: 140 mmol/L (ref 135–146)
Total Bilirubin: 0.8 mg/dL (ref 0.2–1.2)
Total Protein: 6.7 g/dL (ref 6.1–8.1)

## 2020-02-15 LAB — QUANTIFERON-TB GOLD PLUS
Mitogen-NIL: >10 IU/mL
NIL: 0.05 IU/mL
QuantiFERON-TB Gold Plus: NEGATIVE
TB1-NIL: <0 IU/mL
TB2-NIL: <0 IU/mL

## 2020-02-17 NOTE — Progress Notes (Signed)
TB gold negative

## 2020-02-25 ENCOUNTER — Other Ambulatory Visit: Payer: Self-pay

## 2020-02-25 ENCOUNTER — Inpatient Hospital Stay: Payer: 59 | Attending: Oncology | Admitting: Oncology

## 2020-02-25 VITALS — BP 141/81 | HR 64 | Temp 95.2°F | Resp 18 | Ht 69.0 in | Wt 230.8 lb

## 2020-02-25 DIAGNOSIS — Z86718 Personal history of other venous thrombosis and embolism: Secondary | ICD-10-CM | POA: Insufficient documentation

## 2020-02-25 DIAGNOSIS — Z86711 Personal history of pulmonary embolism: Secondary | ICD-10-CM | POA: Diagnosis not present

## 2020-02-25 DIAGNOSIS — Z7901 Long term (current) use of anticoagulants: Secondary | ICD-10-CM | POA: Insufficient documentation

## 2020-02-25 DIAGNOSIS — I82402 Acute embolism and thrombosis of unspecified deep veins of left lower extremity: Secondary | ICD-10-CM | POA: Diagnosis not present

## 2020-02-25 NOTE — Progress Notes (Signed)
Hematology and Oncology Follow Up Visit  [name] [id number] 02/16/1966 54 y.o. 02/25/2020 10:15 AM [name], [name], [name], Joshua Sierras, MD   Principle Diagnosis: 54 year old man with pulmonary emboli diagnosed in March 2021.  He presented with bilateral lobar and peripheral pulmonary artery thrombosis associated with right heart strain in the setting of COVID-19 infection.  He also had deep vein thrombosis.   Current therapy: Full dose anticoagulation with Eliquis started in March 2021.  Interim History: [name] returns today for a follow-up visit.  Since last visit, he reports no major changes in his health.  He feels he has recovered close to his baseline function.  He continues to be on Eliquis without any bleeding issues.  He denies any hemoptysis, hematemesis, hematochezia or easy bruising.  He does report occasional cough related to reflux symptoms.     Medications: I have reviewed the patient's current medications.  Current Outpatient Medications  Medication Sig Dispense Refill  . acetaminophen (TYLENOL) 500 MG tablet Take 1,000 mg by mouth every 8 (eight) hours as needed for mild pain or moderate pain.     [name] apixaban (ELIQUIS) 5 MG TABS tablet Take 1 tablet (5 mg total) by mouth 2 (two) times daily. 180 tablet 1  . benzonatate (TESSALON) 200 MG capsule Take 1 capsule (200 mg total) by mouth 2 (two) times daily as needed for cough. 60 capsule 5  . cetirizine (ZYRTEC) 10 MG tablet Take 1 tablet (10 mg total) by mouth daily. 30 tablet 11  . cholecalciferol (VITAMIN D) 1000 units tablet Take 2,000 Units by mouth daily.     . Coenzyme Q10 (COQ10) 100 MG CAPS Take 100 mg by mouth daily.     [name] etanercept (ENBREL) 50 MG/ML injection Inject 1 syringe into the skin once a week. 12 mL 0  . Magnesium Oxide (MAG-200) 200 MG TABS Take 200 mg by mouth daily.    . Melatonin 5 MG CAPS Take 5 mg by mouth at bedtime as needed (sleep.).     [name] Omega-3 Fatty Acids (FISH OIL ULTRA) 1400 MG  CAPS Take 1,400 mg by mouth daily.    [name] omeprazole (PRILOSEC) 20 MG capsule Take 1 capsule (20 mg total) by mouth daily. 30 capsule 0  . PFIZER-BIONTECH COVID-19 VACC 30 MCG/0.3ML SUSP     . predniSONE (DELTASONE) 10 MG tablet 4 tabs a day x 3 days, 3 tabs a day x 3 days, 2 tabs a day x 3 days, 1 tab a day x 3 days, 1/2 tab a day x 3 days, then stop 32 tablet 0  . TURMERIC PO Take 15 mLs by mouth daily.      No current facility-administered medications for this visit.     Allergies:  Allergies  Allergen Reactions  . Gold-Containing Drug Products Rash    Denies oral or airway involvement - occurred in the 1980s.       Physical Exam: Blood pressure (!) 141/81, pulse 64, temperature (!) 95.2 F (35.1 C), temperature source Tympanic, resp. rate 18, height 5\' 9"  (1.753 m), weight 230 lb 12.8 oz (104.7 kg), SpO2 100 %.   ECOG: 0   General appearance: Comfortable appearing without any discomfort Head: Normocephalic without any trauma Oropharynx: Mucous membranes are moist and pink without any thrush or ulcers. Eyes: Pupils are equal and round reactive to light. Lymph nodes: No cervical, supraclavicular, inguinal or axillary lymphadenopathy.   Heart:regular rate and rhythm.  S1 and S2 without leg edema. Lung: Clear  without any rhonchi or wheezes.  No dullness to percussion. Abdomin: Soft, nontender, nondistended with good bowel sounds.  No hepatosplenomegaly. Musculoskeletal: No joint deformity or effusion.  Full range of motion noted. Neurological: No deficits noted on motor, sensory and deep tendon reflex exam. Skin: No petechial rash or dryness.  Appeared moist.      Lab Results: Lab Results  Component Value Date   WBC 5.5 02/13/2020   HGB 15.9 02/13/2020   HCT 47.2 02/13/2020   MCV 94.6 02/13/2020   PLT 131 (L) 02/13/2020     Chemistry      Component Value Date/Time   NA 140 02/13/2020 1123   NA 139 07/30/2019 0955   K 4.6 02/13/2020 1123   CL 105 02/13/2020 1123    CO2 28 02/13/2020 1123   BUN 26 (H) 02/13/2020 1123   BUN 21 07/30/2019 0955   CREATININE 0.92 02/13/2020 1123      Component Value Date/Time   CALCIUM 9.5 02/13/2020 1123   ALKPHOS 97 07/30/2019 0955   AST 20 02/13/2020 1123   ALT 24 02/13/2020 1123   BILITOT 0.8 02/13/2020 1123   BILITOT 0.5 07/30/2019 0955        Impression and Plan:   54 year old with:  1.  Bilateral PE with heart strain as well as deep vein thrombosis diagnosed in March 2021 in the setting of a COVID-19 infection.  He  is currently on full dose anticoagulation with Eliquis.   The natural course of his illness was reviewed at this time and the risk of thrombosis recurrence was discussed off full dose anticoagulation.  His initial episode was presumably provoked from COVID-19 illness although the severity of his thrombosis could warrant longer anticoagulation.  Risks and benefits of discontinuation of Eliquis were discussed.  Long-term anticoagulation complications including increased risk of bleeding including GI bleeding and intracranial hemorrhage was reviewed.  Also risk of recurrent thrombosis life-threatening events associated with it were reviewed at this time.  After discussion today, we are in favor of extending anticoagulation for another 6 months to complete 12 months of therapy.  After that we will evaluate risks and benefits of expanding anticoagulation.   2.  COVID-19 vaccination: He received vaccination after infection.  Combination of natural immunity as well as vaccination should decrease his risk of repeat COVID-19 illness and infection.  3.  Follow-up: In 6 months for repeat follow-up.  30 minutes were spent on this encounter.  Time was dedicated to reviewing his disease status, updating treatment options and addressing complications related to therapy.     Zola Button, MD 10/26/202110:15 AM

## 2020-03-02 ENCOUNTER — Other Ambulatory Visit: Payer: Self-pay | Admitting: Pharmacist

## 2020-03-02 ENCOUNTER — Other Ambulatory Visit: Payer: Self-pay | Admitting: Physician Assistant

## 2020-03-02 MED ORDER — ENBREL 50 MG/ML ~~LOC~~ SOSY: PREFILLED_SYRINGE | SUBCUTANEOUS | 0 refills | Status: DC

## 2020-03-02 MED FILL — BENZONATATE 200 MG CAP: 200 | 30 days supply | Qty: 60 | Fill #1

## 2020-03-02 NOTE — Telephone Encounter (Signed)
Last Visit: 11/13/2019 Next Visit: 04/15/2020 Labs: 02/13/2020 CBC and CMP are normal except mild thrombocytopenia noted.  TB Gold:  02/13/2020 Neg   Current Dose per office note 11/13/2019: Enbrel 50 mg sq injections every 7 days  DX: Psoriatic arthritis   Okay to refill Enbrel?

## 2020-03-04 MED FILL — ENBREL 50 MG/ML SOSY: 50 | 28 days supply | Qty: 4 | Fill #0

## 2020-03-20 ENCOUNTER — Other Ambulatory Visit: Payer: Self-pay

## 2020-03-20 ENCOUNTER — Ambulatory Visit (INDEPENDENT_AMBULATORY_CARE_PROVIDER_SITE_OTHER): Payer: 59 | Admitting: Family Medicine

## 2020-03-20 DIAGNOSIS — Z1211 Encounter for screening for malignant neoplasm of colon: Secondary | ICD-10-CM | POA: Diagnosis not present

## 2020-03-20 DIAGNOSIS — I10 Essential (primary) hypertension: Secondary | ICD-10-CM | POA: Diagnosis not present

## 2020-03-20 DIAGNOSIS — L4050 Arthropathic psoriasis, unspecified: Secondary | ICD-10-CM

## 2020-03-20 DIAGNOSIS — R739 Hyperglycemia, unspecified: Secondary | ICD-10-CM | POA: Diagnosis not present

## 2020-03-20 DIAGNOSIS — E785 Hyperlipidemia, unspecified: Secondary | ICD-10-CM

## 2020-03-20 NOTE — Progress Notes (Signed)
[name] DOB: 25-Feb-1966 Encounter date: 03/20/2020  This is a 54 y.o. male who presents to establish care - transferring from Dr. [name].   Chief Complaint  Patient presents with  . Establish Care    History of present illness: Had cough late last August last year. Seen and treated for gerd/allergies. Then got COVID, ended up in hospital, had [place]. Cough just remained but is better than it was back in August last year. Has been told to take cough pills, prilosec but cough persists. Does feel like it has lessened a little - more in morning, evening. Had DVT left leg - had PE. Does get twinges in left calf, but was told as long as these go away and since he is on elliquis he should be ok. Was coughing hard, more spells. Couldn't make it through hym in church.  Now, he is able to make it through entire first worse.  It is notable improvement.  Exercises at least 6 days/week. Walks regularly. Able to walk further in the summer. In colder weather harder on joints. Tries to park further away to get some extra steps in.   Psoriatic arthritis since early 20's - general stiffness, tightness. Lower back stiffness. Some days worse than other.    Past Medical History:  Diagnosis Date  . Anemia   . History of avascular necrosis of capital femoral epiphysis 2006, 2007   Bilateral Femoral Head  . HLD (hyperlipidemia)   . Pneumonia    twice  . Psoriatic arthritis [facility])    Past Surgical History:  Procedure Laterality Date  . JOINT REPLACEMENT Bilateral 2006, 2007   Necrosis femoral head (bilateral)  . TOTAL HIP REVISION Right 11/23/2016   Procedure: Acetabulum liner and femoral head revision;  Surgeon: [name], MD;  Location: [facility];  Service: Orthopedics;  Laterality: Right;  . TOTAL HIP REVISION Left 04/10/2019   Procedure: Left hip bearing surface vs total hip arthroplasty revision;  Surgeon: [name], MD;  Location: [facility];  Service: Orthopedics;  Laterality: Left;  141min    Allergies  Allergen Reactions  . Gold-Containing Drug Products Rash    Denies oral or airway involvement - occurred in the 1980s.    Current Meds  Medication Sig  . acetaminophen (TYLENOL) 500 MG tablet Take 1,000 mg by mouth every 8 (eight) hours as needed for mild pain or moderate pain.   [name] apixaban (ELIQUIS) 5 MG TABS tablet Take 1 tablet (5 mg total) by mouth 2 (two) times daily.  . benzonatate (TESSALON) 200 MG capsule Take 1 capsule (200 mg total) by mouth 2 (two) times daily as needed for cough.  . cetirizine (ZYRTEC) 10 MG tablet Take 1 tablet (10 mg total) by mouth daily.  . cholecalciferol (VITAMIN D) 1000 units tablet Take 2,000 Units by mouth daily.   . Coenzyme Q10 (COQ10) 100 MG CAPS Take 100 mg by mouth daily.   [name] etanercept (ENBREL) 50 MG/ML injection Inject 1 syringe into the skin once a week.  . Magnesium Oxide (MAG-200) 200 MG TABS Take 200 mg by mouth daily.  . Melatonin 5 MG CAPS Take 5 mg by mouth at bedtime as needed (sleep.).   [name] Omega-3 Fatty Acids (FISH OIL ULTRA) 1400 MG CAPS Take 1,400 mg by mouth daily.  [name] PFIZER-BIONTECH COVID-19 VACC 30 MCG/0.3ML SUSP   . TURMERIC PO Take 15 mLs by mouth daily.   . [DISCONTINUED] predniSONE (DELTASONE) 10 MG tablet 4 tabs a day x 3 days, 3  tabs a day x 3 days, 2 tabs a day x 3 days, 1 tab a day x 3 days, 1/2 tab a day x 3 days, then stop   Social History   Tobacco Use  . Smoking status: Never Smoker  . Smokeless tobacco: Never Used  Substance Use Topics  . Alcohol use: Yes    Alcohol/week: 0.0 standard drinks    Comment: 3-4 drinks per week   Family History  Problem Relation Age of Onset  . Pneumonia Father   . Tremor Neg Hx      Review of Systems  Constitutional: Negative for chills, fatigue and fever.  Respiratory: Positive for cough (improving). Negative for chest tightness, shortness of breath and wheezing.   Cardiovascular: Negative for chest pain, palpitations and leg swelling.  Neurological: Negative  for dizziness and headaches.    Objective:  There were no vitals taken for this visit.    (patient was coughing and MA did not complete check in process; former vitals mistaken by provider to be new vitals.  BP Readings from Last 3 Encounters:  02/25/20 (!) 141/81  01/15/20 (!) 193/95  11/26/19 (!) 148/86   Wt Readings from Last 3 Encounters:  02/25/20 230 lb 12.8 oz (104.7 kg)  11/26/19 (!) 235 lb 6.4 oz (106.8 kg)  11/13/19 236 lb 3.2 oz (107.1 kg)    Physical Exam Constitutional:      General: He is not in acute distress.    Appearance: He is well-developed.  HENT:     Right Ear: Tympanic membrane, ear canal and external ear normal.     Left Ear: Tympanic membrane, ear canal and external ear normal.     Nose:     Right Turbinates: Swollen and pale.     Left Turbinates: Swollen and pale.     Mouth/Throat:     Pharynx: Oropharynx is clear. No pharyngeal swelling or posterior oropharyngeal erythema.  Cardiovascular:     Rate and Rhythm: Normal rate and regular rhythm.     Heart sounds: Normal heart sounds. No murmur heard.  No friction rub.  Pulmonary:     Effort: Pulmonary effort is normal. No respiratory distress.     Breath sounds: Normal breath sounds. No decreased breath sounds, wheezing, rhonchi or rales.  Musculoskeletal:     Right lower leg: No edema.     Left lower leg: No edema.  Neurological:     Mental Status: He is alert and oriented to person, place, and time.     Motor: Tremor (left upper and lower extremity) present.  Psychiatric:        Mood and Affect: Mood normal.        Behavior: Behavior is slowed.     Assessment/Plan:  1. Primary hypertension Has been controlled through diet/lifestyle. Will have him follow up for routine checks to monitor.  - CBC with Differential/Platelet; Future - Comprehensive metabolic panel; Future - Comprehensive metabolic panel - CBC with Differential/Platelet  2. Hyperlipidemia, unspecified hyperlipidemia type -  Lipid panel; Future - TSH; Future - TSH - Lipid panel  3. Psoriatic arthritis ([place]) Does follow with rheumatology; on enbrel and sx stable.  4. Screening for colon cancer - Cologuard  5. Hyperglycemia - Hemoglobin A1c; Future - Hemoglobin A1c  Return for pending patient report and lab results.  Micheline Rough, MD

## 2020-03-20 NOTE — Patient Instructions (Signed)
Send me a mychart update in about 4 days of how you do without the tessalon perrles.

## 2020-03-21 LAB — CBC WITH DIFFERENTIAL/PLATELET
Absolute Monocytes: 342 cells/uL (ref 200–950)
Basophils Absolute: 23 cells/uL (ref 0–200)
Basophils Relative: 0.4 %
Eosinophils Absolute: 40 cells/uL (ref 15–500)
Eosinophils Relative: 0.7 %
HCT: 48 % (ref 38.5–50.0)
Hemoglobin: 16.4 g/dL (ref 13.2–17.1)
Lymphs Abs: 2400 cells/uL (ref 850–3900)
MCH: 31.9 pg (ref 27.0–33.0)
MCHC: 34.2 g/dL (ref 32.0–36.0)
MCV: 93.4 fL (ref 80.0–100.0)
MPV: 11.4 fL (ref 7.5–12.5)
Monocytes Relative: 6 %
Neutro Abs: 2896 cells/uL (ref 1500–7800)
Neutrophils Relative %: 50.8 %
Platelets: 128 10*3/uL — ABNORMAL LOW (ref 140–400)
RBC: 5.14 10*6/uL (ref 4.20–5.80)
RDW: 12.3 % (ref 11.0–15.0)
Total Lymphocyte: 42.1 %
WBC: 5.7 10*3/uL (ref 3.8–10.8)

## 2020-03-21 LAB — COMPREHENSIVE METABOLIC PANEL
AG Ratio: 1.8 (calc) (ref 1.0–2.5)
ALT: 22 U/L (ref 9–46)
AST: 21 U/L (ref 10–35)
Albumin: 4.6 g/dL (ref 3.6–5.1)
Alkaline phosphatase (APISO): 62 U/L (ref 35–144)
BUN: 18 mg/dL (ref 7–25)
CO2: 27 mmol/L (ref 20–32)
Calcium: 10 mg/dL (ref 8.6–10.3)
Chloride: 103 mmol/L (ref 98–110)
Creat: 0.95 mg/dL (ref 0.70–1.33)
Globulin: 2.6 g/dL (calc) (ref 1.9–3.7)
Glucose, Bld: 80 mg/dL (ref 65–99)
Potassium: 4.3 mmol/L (ref 3.5–5.3)
Sodium: 141 mmol/L (ref 135–146)
Total Bilirubin: 0.6 mg/dL (ref 0.2–1.2)
Total Protein: 7.2 g/dL (ref 6.1–8.1)

## 2020-03-21 LAB — HEMOGLOBIN A1C
Hgb A1c MFr Bld: 5 % of total Hgb (ref <5.7)
Mean Plasma Glucose: 97 (calc)
eAG (mmol/L): 5.4 (calc)

## 2020-03-21 LAB — LIPID PANEL
Cholesterol: 214 mg/dL — ABNORMAL HIGH (ref <200)
HDL: 50 mg/dL (ref >=40)
LDL Cholesterol (Calc): 135 mg/dL (calc) — ABNORMAL HIGH
Non-HDL Cholesterol (Calc): 164 mg/dL (calc) — ABNORMAL HIGH (ref <130)
Total CHOL/HDL Ratio: 4.3 (calc) (ref <5.0)
Triglycerides: 158 mg/dL — ABNORMAL HIGH (ref <150)

## 2020-03-21 LAB — TSH: TSH: 1.62 mIU/L (ref 0.40–4.50)

## 2020-03-28 ENCOUNTER — Encounter: Payer: Self-pay | Admitting: Family Medicine

## 2020-03-30 ENCOUNTER — Encounter: Payer: Self-pay | Admitting: Family Medicine

## 2020-04-01 LAB — COLOGUARD

## 2020-04-01 MED FILL — ENBREL 50 MG/ML SOSY: 50 | 28 days supply | Qty: 4 | Fill #1

## 2020-04-01 NOTE — Progress Notes (Signed)
Office Visit Note  Patient: [name]             Date of Birth: 1965-06-01           MRN: [id number]             PCP: Caren Macadam, MD Referring: Eulas Post, MD Visit Date: 04/15/2020 Occupation: @GUAROCC @  Subjective:  Right hand paresthesia   History of Present Illness: [name] is a 54 y.o. male with history of psoriatic arthritis, osteoarthritis, and DDD. He is on enbrel 50 mg sq injections once weekly.  He has not missed any doses of Enbrel recently.  He denies any psoriatic arthritis flares recently.  He states that he has noticed paresthesias in the right hand specifically in the index and middle finger.  His symptoms are most severe at night.  He denies any overuse activities recently.  He denies any pain or swelling in his right wrist or hand.  He states he is also been experiencing some increased discomfort on the plantar aspect of his right great toe.  He denies any joint swelling.  He has not had any recent injuries.  He states the pain is alleviated while wearing proper fitting shoes.  He states that overall he feels as though his arthralgias and stiffness have increased slightly with the cooler weather temperatures.  He experiences occasional stiffness in his lower back but has been trying to perform back exercises on a regular basis.  He denies any Achilles tendinitis or plantar fasciitis at this time.  He has been taking Tylenol as needed for pain relief.  He continues to take turmeric and fish oil.  He denies any active psoriasis at this time.    Activities of Daily Living:  Patient reports morning stiffness for a few  minutes.   Patient Denies nocturnal pain.  Difficulty dressing/grooming: Denies Difficulty climbing stairs: Reports Difficulty getting out of chair: Denies Difficulty using hands for taps, buttons, cutlery, and/or writing: Denies  Review of Systems  Constitutional: Negative for fatigue and night sweats.  HENT: Negative for mouth  sores, mouth dryness and nose dryness.   Eyes: Negative for redness and dryness.  Respiratory: Negative for shortness of breath and difficulty breathing.   Cardiovascular: Negative for chest pain, palpitations, hypertension, irregular heartbeat and swelling in legs/feet.  Gastrointestinal: Negative for blood in stool, constipation and diarrhea.  Endocrine: Negative for increased urination.  Genitourinary: Negative for painful urination.  Musculoskeletal: Positive for arthralgias, joint pain and morning stiffness. Negative for joint swelling, myalgias, muscle weakness, muscle tenderness and myalgias.  Skin: Negative for color change, rash, hair loss, nodules/bumps, skin tightness, ulcers and sensitivity to sunlight.  Allergic/Immunologic: Negative for susceptible to infections.  Neurological: Positive for parasthesias. Negative for dizziness, fainting, memory loss, night sweats and weakness.  Hematological: Negative for swollen glands.  Psychiatric/Behavioral: Negative for depressed mood, confusion and sleep disturbance. The patient is not nervous/anxious.     PMFS History:  Patient Active Problem List   Diagnosis Date Noted  . Corns and callosities 01/15/2020  . Hammer toes, bilateral 01/15/2020  . Orthopnea 11/01/2019  . History of COVID-19 07/30/2019  . Acute deep vein thrombosis (DVT) of left lower extremity ([place]) 07/30/2019  . Abnormal CT of the chest 07/30/2019  . Cough 07/30/2019  . Leg DVT (deep venous thromboembolism), acute, bilateral ([place]) 07/24/2019  . HTN (hypertension) 07/24/2019  . COVID-19 virus infection 07/20/2019  . Acute respiratory failure with hypoxia ([place]) 07/20/2019  . Acute  pulmonary embolism ([place]) 07/19/2019  . Essential tremor 04/03/2017  . Failed total hip arthroplasty ([place]) 11/23/2016  . High risk medications (not anticoagulants) long-term use 09/20/2016  . Bilateral hand pain 09/20/2016  . DDD (degenerative disc disease), cervical 09/20/2016  . History  of total hip replacement, bilateral 09/20/2016  . DDD (degenerative disc disease), lumbar 09/20/2016  . Bilateral plantar fasciitis 09/20/2016  . Psoriasis 09/20/2016  . Primary osteoarthritis of both feet 09/20/2016  . Primary osteoarthritis of both hands 09/20/2016  . Primary osteoarthritis of both knees 09/20/2016  . Prediabetes 09/22/2014  . Hyperlipidemia 09/22/2014  . URI (upper respiratory infection) 07/07/2012  . Acute bronchitis 07/07/2012  . OBESITY 07/03/2008  . AVASCULAR NECROSIS, FEMORAL HEAD 07/03/2008  . Psoriatic arthritis ([place]) 05/02/2001    Past Medical History:  Diagnosis Date  . Anemia   . History of avascular necrosis of capital femoral epiphysis 2006, 2007   Bilateral Femoral Head  . HLD (hyperlipidemia)   . Pneumonia    twice  . Psoriatic arthritis ([place])     Family History  Problem Relation Age of Onset  . Pneumonia Father   . Tremor Neg Hx    Past Surgical History:  Procedure Laterality Date  . JOINT REPLACEMENT Bilateral 2006, 2007   Necrosis femoral head (bilateral)  . TOTAL HIP REVISION Right 11/23/2016   Procedure: Acetabulum liner and femoral head revision;  Surgeon: [name], MD;  Location: [facility];  Service: Orthopedics;  Laterality: Right;  . TOTAL HIP REVISION Left 04/10/2019   Procedure: Left hip bearing surface vs total hip arthroplasty revision;  Surgeon: [name], MD;  Location: [facility];  Service: Orthopedics;  Laterality: Left;  136min   Social History   Social History Narrative   Lives at home w/ his wife   Right-handed   Caffeine: 2-3 cups per day   Immunization History  Administered Date(s) Administered  . Influenza Inj Mdck Quad Pf 02/01/2019  . Influenza,inj,Quad PF,6+ Mos 02/03/2017, 03/21/2018  . Influenza,inj,quad, With Preservative 02/01/2019  . Influenza-Unspecified 02/03/2017, 03/21/2018, 02/01/2019  . PFIZER SARS-COV-2 Vaccination 10/06/2019, 10/27/2019  . Pneumococcal Conjugate-13 03/21/2018  . Tdap  09/22/2014  . Zoster 04/02/2018, 05/23/2018  . Zoster Recombinat (Shingrix) 04/02/2018, 05/23/2018     Objective: Vital Signs: BP (!) 172/85 (BP Location: Left Arm, Patient Position: Sitting, Cuff Size: Normal)   Pulse (!) 55   Resp 15   Ht 5\' 9"  (1.753 m)   Wt 235 lb 9.6 oz (106.9 kg)   BMI 34.79 kg/m    Physical Exam Vitals and nursing note reviewed.  Constitutional:      Appearance: He is well-developed and well-nourished.  HENT:     Head: Normocephalic and atraumatic.  Eyes:     Extraocular Movements: EOM normal.     Conjunctiva/sclera: Conjunctivae normal.     Pupils: Pupils are equal, round, and reactive to light.  Abdominal:     Palpations: Abdomen is soft.  Musculoskeletal:     Cervical back: Normal range of motion.  Skin:    General: Skin is warm and dry.     Capillary Refill: Capillary refill takes less than 2 seconds.  Neurological:     Mental Status: He is alert and oriented to person, place, and time.  Psychiatric:        Mood and Affect: Mood and affect normal.        Behavior: Behavior normal.      Musculoskeletal Exam:  C-spine limited ROM with lateral rotation.  Postural thoracic  kyphosis.  Shoulder joints have limited internal rotation bilaterally.  Elbow joints good ROM with no discomfort.  Limited flexion of both wrist joints.  Contracture of left 4th PIP joint.  PIP and DIP thickening and stiffness consistent with osteoarthritis.  Incomplete fist formation bilaterally.  Negative Phalen's and Tinel's sign bilaterally.  Knee joints good ROM with no warmth or effusion.  Ankle joints good ROM with no tenderness or inflammation.  Pedal edema noted bilaterally.  No tenderness or MTP or PIP joints.  PIP and DIP thickening consistent with osteoarthritis of both feet.  Callus formation on plantar surface of right 1st metatarsal.  Corn present on side right great toe.  CDAI Exam: CDAI Score: -- Patient Global: --; Provider Global: -- Swollen: --; Tender:  -- Joint Exam 04/15/2020   No joint exam has been documented for this visit   There is currently no information documented on the homunculus. Go to the Rheumatology activity and complete the homunculus joint exam.  Investigation: No additional findings.  Imaging: No results found.  Recent Labs: Lab Results  Component Value Date   WBC 5.7 03/20/2020   HGB 16.4 03/20/2020   PLT 128 (L) 03/20/2020   NA 141 03/20/2020   K 4.3 03/20/2020   CL 103 03/20/2020   CO2 27 03/20/2020   GLUCOSE 80 03/20/2020   BUN 18 03/20/2020   CREATININE 0.95 03/20/2020   BILITOT 0.6 03/20/2020   ALKPHOS 97 07/30/2019   AST 21 03/20/2020   ALT 22 03/20/2020   PROT 7.2 03/20/2020   ALBUMIN 4.6 07/30/2019   CALCIUM 10.0 03/20/2020   GFRAA 107 02/13/2020   QFTBGOLDPLUS NEGATIVE 02/13/2020    Speciality Comments: No specialty comments available.  Procedures:  No procedures performed Allergies: Gold-containing drug products   Assessment / Plan:     Visit Diagnoses: Psoriatic arthritis ([place]): He has no synovitis or dactylitis on examination today.  He has not had any recent psoriatic arthritis flares.  He is clinically doing well on Enbrel 50 mg sq injections once weekly.  He has noticed increased arthralgias and joint stiffness with cooler weather temperatures but has no joint tenderness or inflammation on examination today.  He has no Achilles tendinitis or plantar fasciitis.  He experiences occasional stiffness and discomfort in both SI joints but has been performing back exercises on a regular basis which has alleviated his discomfort.  He has no active psoriasis at this time.  He will continue on Enbrel 50 mg subcu injections once weekly.  He was advised to notify us if he develops increased joint pain or joint swelling.  He will follow-up in the office in 5 months.  Psoriasis: He has no active psoriasis at this time.   High risk medication use - Enbrel 50 mg sq injections every 7 days. TB gold  negative on 02/13/20 and will continue to be monitored yearly. CBC and CMP were updated on 03/20/20.  He will be due to update lab work in February and every 3 months.  Standing orders for CBC and CMP are in place. He has not had any recent infections.  We discussed the importance of holding Enbrel if he develops signs or symptoms of an infection and to resume once the infection has completely cleared. He has received both COVID-19 vaccinations and is considering receiving the booster dose.  Primary osteoarthritis of both hands: He has PIP and DIP thickening consistent with osteoarthritis of both hands.  Flexion contracture of the left fourth PIP joint noted.  He  has incomplete fist formation bilaterally.  Joint protection and muscle strengthening were discussed.  We discussed the use of arthritis compression gloves.  Right hand paresthesia: He has been experiencing intermittent paresthesias in the right hand specifically in the right second and third digits at night.  He has not been performing any overuse activities recently.  Negative Phalen's and Tinel's sign on examination.  We discussed the use of a carpal tunnel night splint which he plans on purchasing.  We also discussed a referral for NCV with EMG but he declined at this time.  He will notify us if his symptoms persist or worsen.  History of total hip replacement, bilateral: Doing well.   Primary osteoarthritis of both knees: He has good ROM of both knee joints with no discomfort.  No warmth or effusion of knee joints noted.   Primary osteoarthritis of both feet: He has PIP and DIP thickening consistent with osteoarthritis of both feet.  No tenderness of MTP or PIP joints noted.  Ankle joints have good range of motion with no tenderness or inflammation.  Callus formation on the plantar aspect of the right first metatarsal noted.  Corn on the side of right great toe noted.  He will be following up at [facility] with Dr. Prudence Davidson on  04/29/20.  In the meantime he was encouraged to wear proper fitting shoes with support and cushion.  He was also encouraged to wear thick socks such as smart wool socks for added cushion.   DDD (degenerative disc disease), cervical: C-spine has limited ROM with lateral rotation bilaterally. He performs neck exercises on a regular basis.   DDD (degenerative disc disease), lumbar: He experiences discomfort and stiffness in his lower back intermittently.  He was encouraged to perform stretching and strengthening exercises.  Discussed the use of a foam roller.   Other fatigue: Stable.  He was encouraged to stay consistent with his walking regimen.   History of vitamin D deficiency: He is taking vitamin D 2,000 units by mouth daily.   Other medical conditions are listed as follows:   History of hypertension  History of prediabetes  History of hyperlipidemia    Orders: No orders of the defined types were placed in this encounter.  No orders of the defined types were placed in this encounter.    Follow-Up Instructions: Return in 5 months (on 09/13/2020) for Psoriatic arthritis, Osteoarthritis, DDD.   Ofilia Neas, PA-C  Note - This record has been created using Dragon software.  Chart creation errors have been sought, but may not always  have been located. Such creation errors do not reflect on  the standard of medical care.

## 2020-04-15 ENCOUNTER — Other Ambulatory Visit: Payer: Self-pay

## 2020-04-15 ENCOUNTER — Ambulatory Visit (INDEPENDENT_AMBULATORY_CARE_PROVIDER_SITE_OTHER): Payer: 59 | Admitting: Physician Assistant

## 2020-04-15 ENCOUNTER — Encounter: Payer: Self-pay | Admitting: Physician Assistant

## 2020-04-15 VITALS — BP 172/85 | HR 55 | Resp 15 | Ht 69.0 in | Wt 235.6 lb

## 2020-04-15 DIAGNOSIS — M5030 Other cervical disc degeneration, unspecified cervical region: Secondary | ICD-10-CM

## 2020-04-15 DIAGNOSIS — M170 Bilateral primary osteoarthritis of knee: Secondary | ICD-10-CM | POA: Diagnosis not present

## 2020-04-15 DIAGNOSIS — L4050 Arthropathic psoriasis, unspecified: Secondary | ICD-10-CM

## 2020-04-15 DIAGNOSIS — M5136 Other intervertebral disc degeneration, lumbar region: Secondary | ICD-10-CM

## 2020-04-15 DIAGNOSIS — M19041 Primary osteoarthritis, right hand: Secondary | ICD-10-CM

## 2020-04-15 DIAGNOSIS — M19071 Primary osteoarthritis, right ankle and foot: Secondary | ICD-10-CM | POA: Diagnosis not present

## 2020-04-15 DIAGNOSIS — Z87898 Personal history of other specified conditions: Secondary | ICD-10-CM

## 2020-04-15 DIAGNOSIS — Z96643 Presence of artificial hip joint, bilateral: Secondary | ICD-10-CM | POA: Diagnosis not present

## 2020-04-15 DIAGNOSIS — R202 Paresthesia of skin: Secondary | ICD-10-CM

## 2020-04-15 DIAGNOSIS — L409 Psoriasis, unspecified: Secondary | ICD-10-CM

## 2020-04-15 DIAGNOSIS — R5383 Other fatigue: Secondary | ICD-10-CM

## 2020-04-15 DIAGNOSIS — M19072 Primary osteoarthritis, left ankle and foot: Secondary | ICD-10-CM

## 2020-04-15 DIAGNOSIS — Z8639 Personal history of other endocrine, nutritional and metabolic disease: Secondary | ICD-10-CM

## 2020-04-15 DIAGNOSIS — Z79899 Other long term (current) drug therapy: Secondary | ICD-10-CM

## 2020-04-15 DIAGNOSIS — Z8679 Personal history of other diseases of the circulatory system: Secondary | ICD-10-CM

## 2020-04-15 DIAGNOSIS — M19042 Primary osteoarthritis, left hand: Secondary | ICD-10-CM

## 2020-04-15 NOTE — Patient Instructions (Addendum)
Standing Labs We placed an order today for your standing lab work.   Please have your standing labs drawn in February and every 3 months   If possible, please have your labs drawn 2 weeks prior to your appointment so that the provider can discuss your results at your appointment.  We have open lab daily Monday through Thursday from 8:30-12:30 PM and 1:30-4:30 PM and Friday from 8:30-12:30 PM and 1:30-4:00 PM at the office of Dr. Bo Merino, [facility].   Please be advised, patients with office appointments requiring lab work will take precedents over walk-in lab work.  If possible, please come for your lab work on Monday and Friday afternoons, as you may experience shorter wait times. The office is located at [address] [place], [place], [place] [address] No appointment is necessary.   Labs are drawn by [facility]. Please bring your co-pay at the time of your lab draw.  You may receive a bill from [facility] for your lab work.  If you wish to have your labs drawn at another location, please call the office 24 hours in advance to send orders.  If you have any questions regarding directions or hours of operation,  please call [phone].   As a reminder, please drink plenty of water prior to coming for your lab work. Thanks!  COVID-19 vaccine recommendations:   COVID-19 vaccine is recommended for everyone (unless you are allergic to a vaccine component), even if you are on a medication that suppresses your immune system.   If you are on Methotrexate, Cellcept (mycophenolate), Rinvoq, Morrie Sheldon, and Olumiant- hold the medication for 1 week after each vaccine. Hold Methotrexate for 2 weeks after the single dose COVID-19 vaccine.   If you are on Orencia subcutaneous injection - hold medication one week prior to and one week after the first COVID-19 vaccine dose (only).   If you are on Orencia IV infusions- time vaccination administration so that the first COVID-19  vaccination will occur four weeks after the infusion and postpone the subsequent infusion by one week.   If you are on Cyclophosphamide or Rituxan infusions please contact your doctor prior to receiving the COVID-19 vaccine.   Do not take Tylenol or any anti-inflammatory medications (NSAIDs) 24 hours prior to the COVID-19 vaccination.   There is no direct evidence about the efficacy of the COVID-19 vaccine in individuals who are on medications that suppress the immune system.   Even if you are fully vaccinated, and you are on any medications that suppress your immune system, please continue to wear a mask, maintain at least six feet social distance and practice hand hygiene.   If you develop a COVID-19 infection, please contact your PCP or our office to determine if you need monoclonal antibody infusion.  The booster vaccine is now available for immunocompromised patients.   Please see the following web sites for updated information.   [email or web address]

## 2020-04-24 MED FILL — ELIQUIS 5 MG TABLET: 5 | 90 days supply | Qty: 180 | Fill #1

## 2020-04-28 ENCOUNTER — Telehealth: Payer: Self-pay | Admitting: Pharmacist

## 2020-04-28 MED FILL — ENBREL 50 MG/ML SOSY: 50 | 28 days supply | Qty: 4 | Fill #2

## 2020-04-28 NOTE — Telephone Encounter (Signed)
Received notification from [facility] regarding a prior authorization for ENBREL. Authorization has been APPROVED from 04/27/20 through 04/26/21.  15mL per 28 days x 12 fills   Must be filled through [facility]  Authorization # [id number] Phone # [phone]

## 2020-04-29 ENCOUNTER — Ambulatory Visit: Payer: Medicare Other | Admitting: Podiatry

## 2020-05-19 IMAGING — CT CT ANGIO CHEST
2 of 6 series · 18 of 46 positions shown · IV contrast (APPLIED)
Comparison: None.

CLINICAL DATA: High probability for pulmonary embolism. Left calf
pain. [id number] diagnosis 2 weeks ago. Dehydration.

EXAM:
CT ANGIOGRAPHY CHEST WITH CONTRAST
TECHNIQUE: Multidetector CT imaging of the chest was performed using the
standard protocol during bolus administration of intravenous
contrast. Multiplanar CT image reconstructions and MIPs were
obtained to evaluate the vascular anatomy. Automatic exposure
control utilized.
CONTRAST:  100mL OMNIPAQUE IOHEXOL 350 MG/ML SOLN

[Series 6: thins · axial · 0.84mm/px · z∈[+155,+434]mm · 16 of 307 slices shown]
[im 14/307  lung]
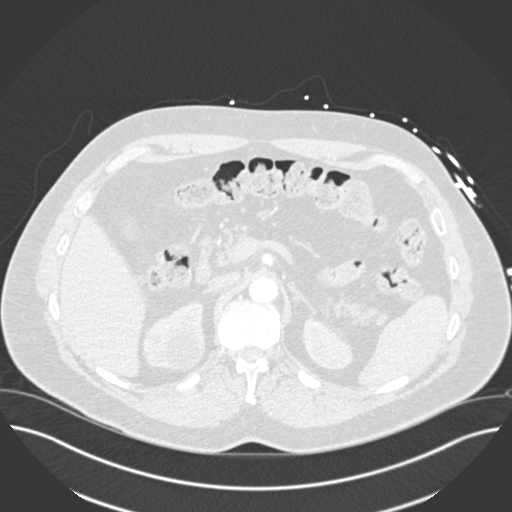
[im 40/307  soft-tissue]
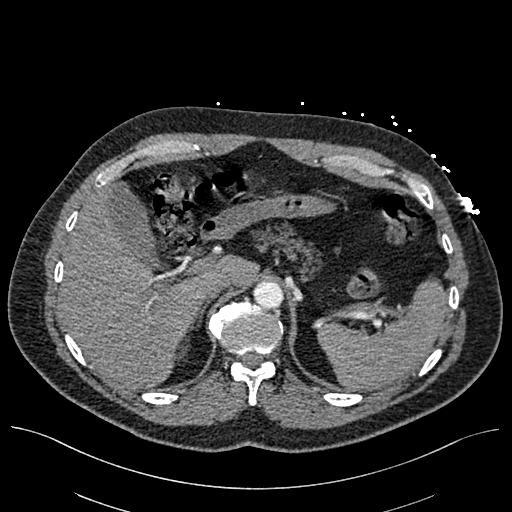
[im 54/307  lung]
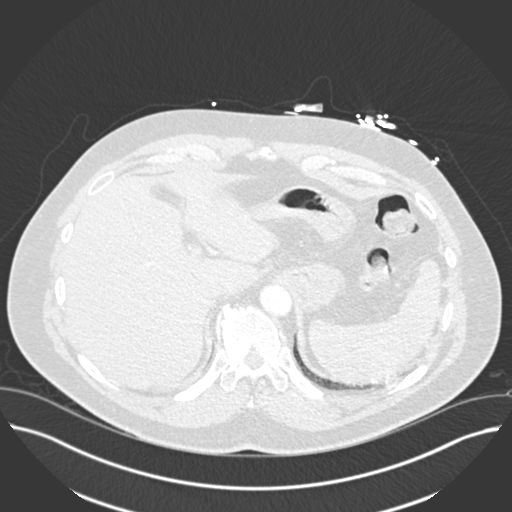
[im 67/307  soft-tissue]
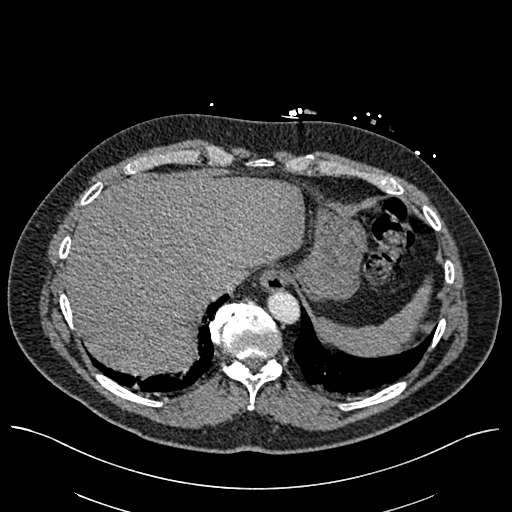
[im 94/307  lung]
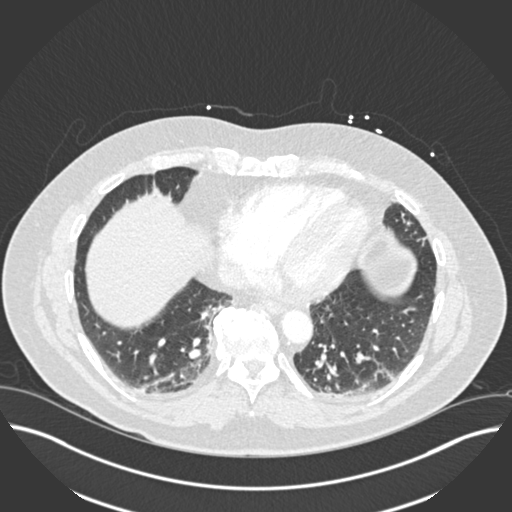
[im 107/307  soft-tissue]
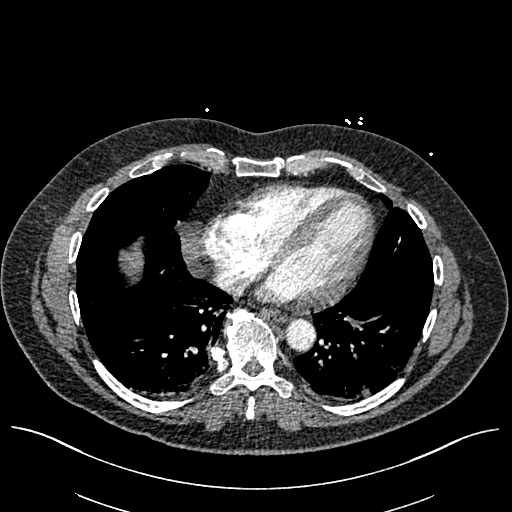
[im 120/307  lung]
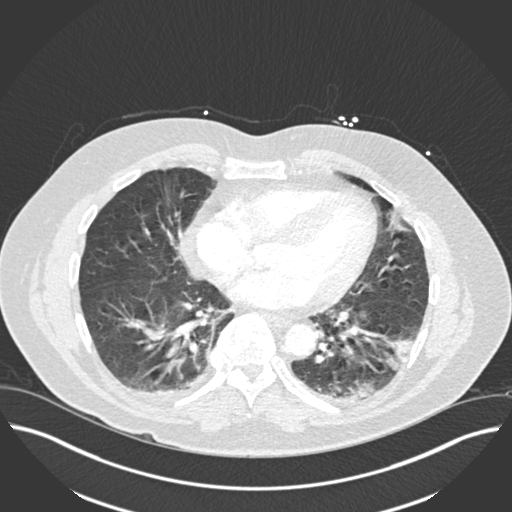
[im 147/307  soft-tissue]
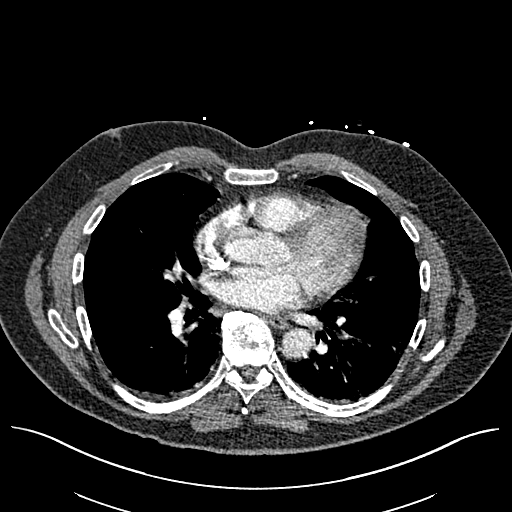
[im 160/307  lung]
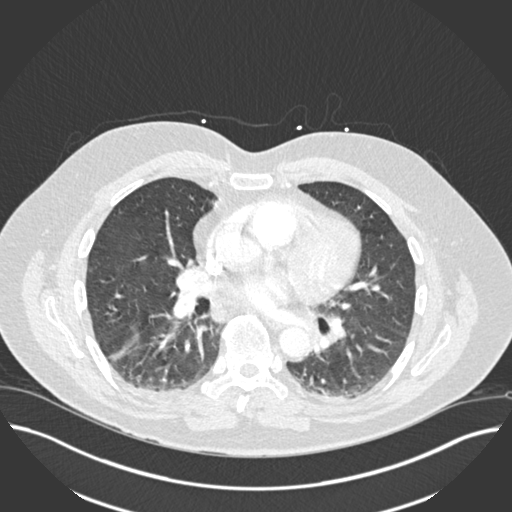
[im 187/307  soft-tissue]
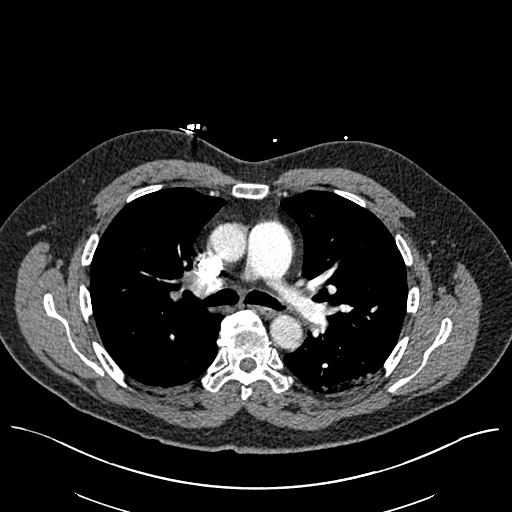
[im 200/307  lung]
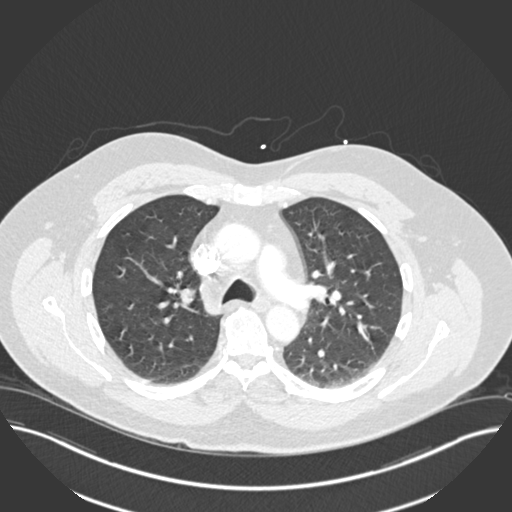
[im 213/307  soft-tissue]
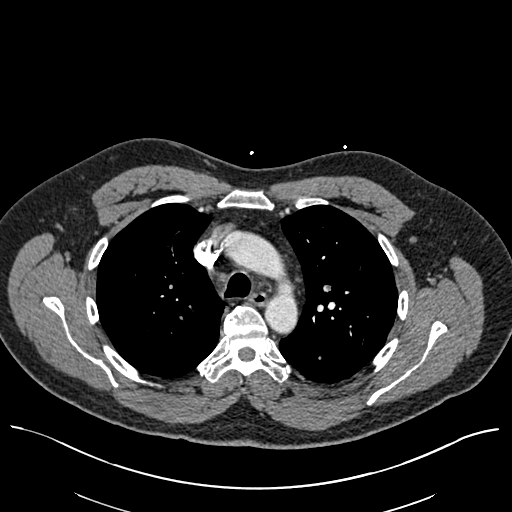
[im 240/307  lung]
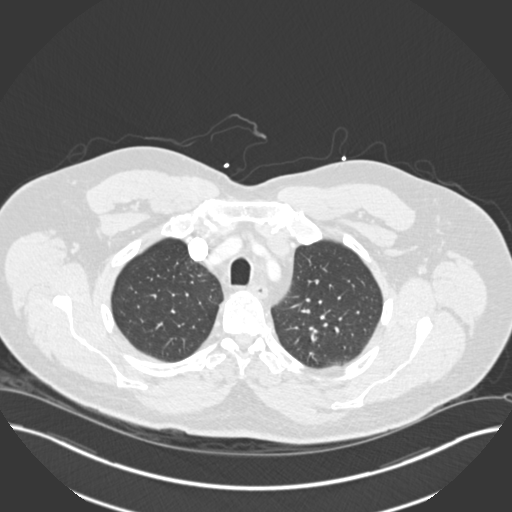
[im 253/307  soft-tissue]
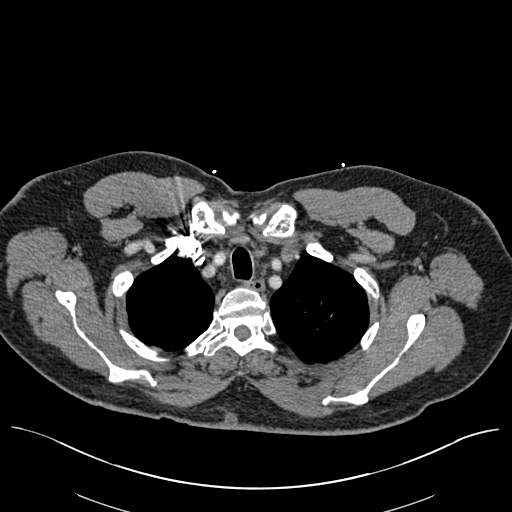
[im 267/307  lung]
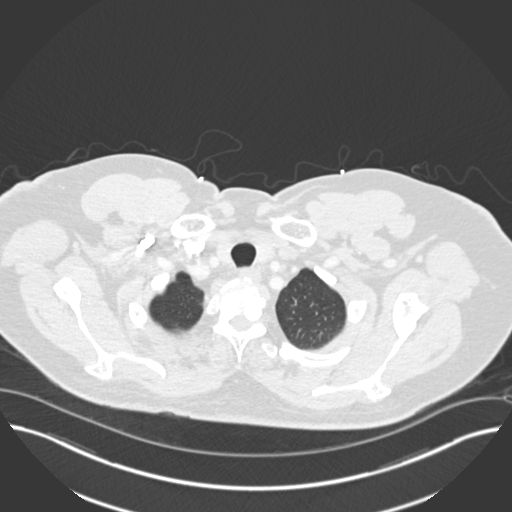
[im 293/307  soft-tissue]
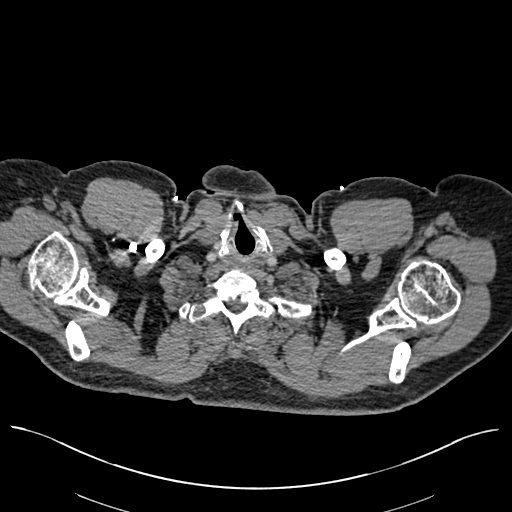

[Series 8: coronal mpr · coronal · 0.64mm/px · 2 of 92 slices shown]
[im 31/92  soft-tissue]
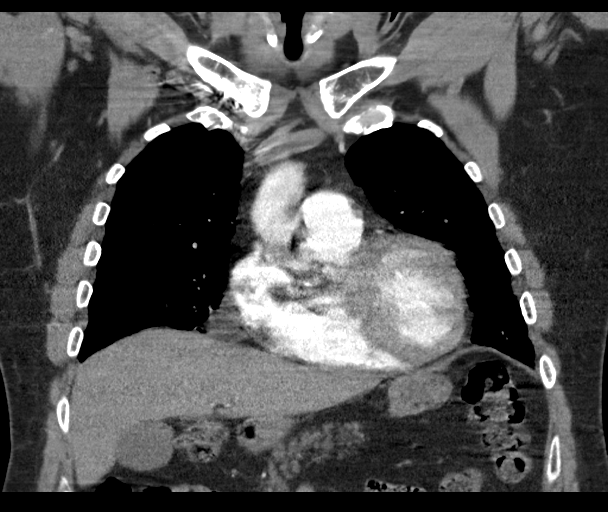
[im 61/92  soft-tissue]
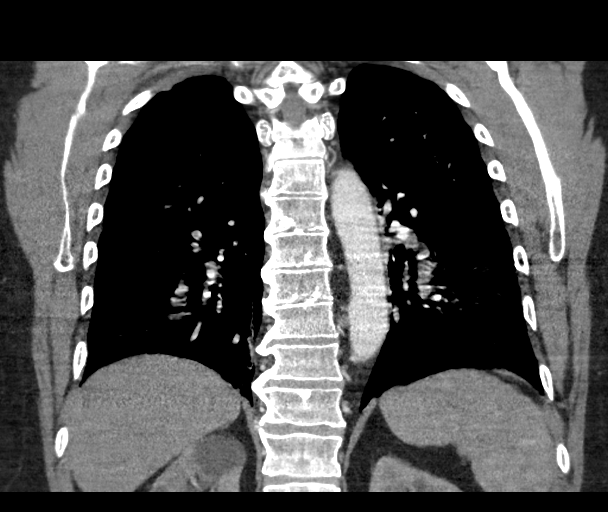

[18 of 46 positions shown; findings below may reference images not displayed]

FINDINGS: Cardiovascular: Occlusive thrombus in the right upper lobar
arteries. Additional bilateral more peripheral pulmonary emboli
including nonocclusive thrombus in the right medial lower lobar
pulmonary artery. Pulmonary artery hypertension, the pulmonary trunk
measuring 3.8 cm diameter. Borderline cardiomegaly with mild
coronary calcification. Right ventricular to left ventricular ratio
1.2, consistent with right heart strain. No pericardial fluid.

Mediastinum/Nodes: Subcentimeter noncalcified benign-appearing
mediastinal lymph nodes. Small hiatal hernia. A 6 x 2 x 2 cm 22
Hounsfield unit cyst is present in the right cardiophrenic angle or
slightly increased in size from 4 x 2 x 2 cm on the July 30, 2011
study.

Lungs/Pleura: Hampton's hump deformity in the lingula and left base
and right upper lobe. Trace bilateral pleural fluid. Central
pulmonary vascular congestion without overt pulmonary edema.
Subpleural atelectasis.

Upper Abdomen: Benign-appearing water density right renal cortical
cysts measuring 8 mm in the midpole and 35 mm in the upper pole.
Mild fatty infiltration of the pancreas.

Musculoskeletal: Moderate degenerative changes. Bridging anterior
osteophytes in the lower thoracic spine.

Review of the MIP images confirms the above findings.

I discussed critical results by telephone at the time of
interpretation on 07/19/2019 at [DATE] p.m. Eastern standard time with
provider ROSSEL BISSO , who verbally acknowledged these results.
IMPRESSION: Bilateral lobar and peripheral pulmonary arteries with right heart
strain, the RV:LV ratio 1.2. Occlusive thrombus in the right upper
lobar arteries.

Hampton's hump deformities in the lingula and left base and right
upper lobe. Unenhanced CT chest recommended in 3 months.

A benign pericardial recess cyst, slightly increased in size since
9565. No additional surveillance imaging is recommended.

Borderline cardiomegaly with mild coronary calcification and
pulmonary artery hypertension.

Trace bilateral pleural fluid.

## 2020-05-19 IMAGING — DX DG CHEST 1V PORT
1 series · 1 of 1 positions shown · non-contrast
Comparison: 5235

CLINICAL DATA: Shortness of breath, COVID positive

EXAM:
PORTABLE CHEST 1 VIEW

[chest ap]
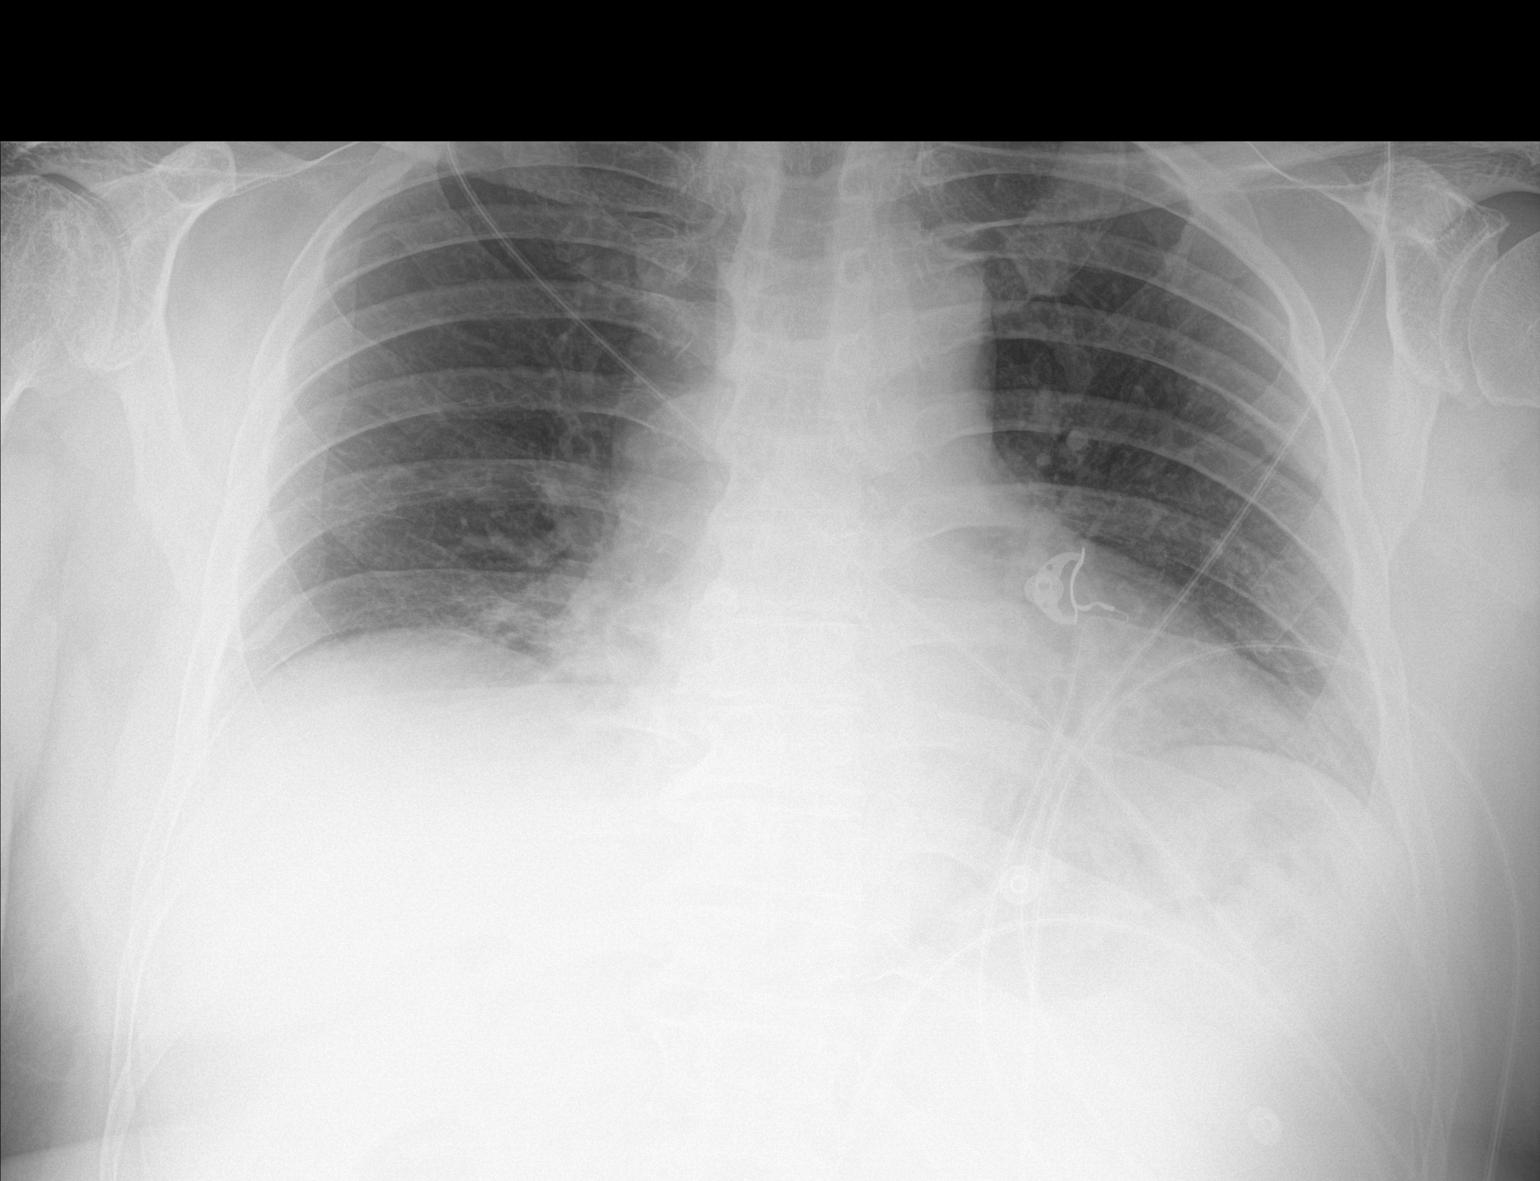

[1 of 1 positions shown; findings below may reference images not displayed]

FINDINGS: Low lung volumes. No consolidation or edema. No pleural effusion or
pneumothorax cardiomediastinal silhouette is likely within normal
limits for portable technique.
IMPRESSION: No acute process in the chest.

## 2020-05-22 ENCOUNTER — Telehealth: Payer: Self-pay | Admitting: Internal Medicine

## 2020-05-22 NOTE — Telephone Encounter (Signed)
6 mo f/u 05/22/20 @ 9:12 am spoke with patient regartding new appointemnt date and time (rescheduled from 05/26/20 due to provider schedule change) kf

## 2020-05-25 ENCOUNTER — Other Ambulatory Visit: Payer: Self-pay | Admitting: Pharmacist

## 2020-05-25 ENCOUNTER — Other Ambulatory Visit: Payer: Self-pay | Admitting: Physician Assistant

## 2020-05-25 MED ORDER — ENBREL 50 MG/ML ~~LOC~~ SOSY
PREFILLED_SYRINGE | SUBCUTANEOUS | 2 refills | Status: DC
Start: 2020-05-25 — End: 2020-05-25

## 2020-05-25 NOTE — Telephone Encounter (Signed)
Last Visit: 04/15/2020 Next Visit: 09/16/2020 Labs: 03/20/2020, Platelets 128, Cholestrol 214, Triglycerides 158, LDL Cholesterol 135, non-HDL Cholesterol 164,  TB Gold: 02/13/2020 negative  Current Dose per office note 04/15/2020, Enbrel 50 mg sq injections every 7 days  FU:[id number] arthritis   Okay to refill Enbrel?

## 2020-05-26 ENCOUNTER — Ambulatory Visit: Payer: 59 | Admitting: Internal Medicine

## 2020-05-27 MED FILL — ENBREL 50 MG/ML SOSY: 50 | 28 days supply | Qty: 4 | Fill #0

## 2020-05-28 DIAGNOSIS — Z1211 Encounter for screening for malignant neoplasm of colon: Secondary | ICD-10-CM | POA: Diagnosis not present

## 2020-05-28 LAB — COLOGUARD: Cologuard: NEGATIVE

## 2020-06-03 ENCOUNTER — Other Ambulatory Visit: Payer: Self-pay

## 2020-06-03 ENCOUNTER — Encounter: Payer: Self-pay | Admitting: Podiatry

## 2020-06-03 ENCOUNTER — Ambulatory Visit (INDEPENDENT_AMBULATORY_CARE_PROVIDER_SITE_OTHER): Payer: 59 | Admitting: Podiatry

## 2020-06-03 ENCOUNTER — Other Ambulatory Visit: Payer: Self-pay | Admitting: *Deleted

## 2020-06-03 DIAGNOSIS — Z79899 Other long term (current) drug therapy: Secondary | ICD-10-CM

## 2020-06-03 DIAGNOSIS — L84 Corns and callosities: Secondary | ICD-10-CM | POA: Diagnosis not present

## 2020-06-03 DIAGNOSIS — M2041 Other hammer toe(s) (acquired), right foot: Secondary | ICD-10-CM

## 2020-06-03 DIAGNOSIS — M2042 Other hammer toe(s) (acquired), left foot: Secondary | ICD-10-CM

## 2020-06-03 NOTE — Progress Notes (Signed)
This patient presents the office with chief complaint of painful callus on his right foot.  Patient states that there is a painful callus on the tip of the third and fifth toes right foot.  He says these are painful walking and wearing his shoes.  He also has a callus on his big toe right foot.  He presents the office today for an evaluation and treatment of these painful callus.  Patient is presently taking Eliquis.  Vascular  Dorsalis pedis  Are weakly  palpable  B/L. Posterior tibial pulses are absent  B/L. Capillary return  WNL.  Temperature gradient is  WNL.  Skin turgor  WNL  Sensorium  Senn Weinstein monofilament wire  WNL. Normal tactile sensation.  Nail Exam  Patient has normal nails with no evidence of bacterial or fungal infection with the exception right hallux which is thick and deformed.  Orthopedic  Exam  Muscle tone and muscle strength  WNL.  No limitations of motion feet  B/L.  No crepitus or joint effusion noted.  Retrocalcaneal exostosis right foot.  Severely rigid hammer toes 2-5  B/L  Skin  No open lesions.  Normal skin texture and turgor. Callus distal aspect 3rd toe right.  Listers corn fifth toe right.  Pinch callus hallux  Right.  Skin lesion 3rd toe left asymptomatic.    Callus secondary to hammer toes  B/L.     Debride callus with # 15 blade followed by dremel use.    RTC 3 months    Gardiner Barefoot DPM

## 2020-06-04 LAB — COMPLETE METABOLIC PANEL WITH GFR
AG Ratio: 2 (calc) (ref 1.0–2.5)
ALT: 23 U/L (ref 9–46)
AST: 21 U/L (ref 10–35)
Albumin: 4.7 g/dL (ref 3.6–5.1)
Alkaline phosphatase (APISO): 69 U/L (ref 35–144)
BUN: 22 mg/dL (ref 7–25)
CO2: 25 mmol/L (ref 20–32)
Calcium: 9.7 mg/dL (ref 8.6–10.3)
Chloride: 104 mmol/L (ref 98–110)
Creat: 1.05 mg/dL (ref 0.70–1.33)
GFR, Est African American: 92 mL/min/{1.73_m2} (ref >=60)
GFR, Est Non African American: 79 mL/min/{1.73_m2} (ref >=60)
Globulin: 2.4 g/dL (calc) (ref 1.9–3.7)
Glucose, Bld: 77 mg/dL (ref 65–99)
Potassium: 4.4 mmol/L (ref 3.5–5.3)
Sodium: 140 mmol/L (ref 135–146)
Total Bilirubin: 0.7 mg/dL (ref 0.2–1.2)
Total Protein: 7.1 g/dL (ref 6.1–8.1)

## 2020-06-04 LAB — CBC WITH DIFFERENTIAL/PLATELET
Absolute Monocytes: 254 cells/uL (ref 200–950)
Basophils Absolute: 19 cells/uL (ref 0–200)
Basophils Relative: 0.4 %
Eosinophils Absolute: 28 cells/uL (ref 15–500)
Eosinophils Relative: 0.6 %
HCT: 47.8 % (ref 38.5–50.0)
Hemoglobin: 16.5 g/dL (ref 13.2–17.1)
Lymphs Abs: 1833 cells/uL (ref 850–3900)
MCH: 32.3 pg (ref 27.0–33.0)
MCHC: 34.5 g/dL (ref 32.0–36.0)
MCV: 93.5 fL (ref 80.0–100.0)
MPV: 11.1 fL (ref 7.5–12.5)
Monocytes Relative: 5.4 %
Neutro Abs: 2566 cells/uL (ref 1500–7800)
Neutrophils Relative %: 54.6 %
Platelets: 133 10*3/uL — ABNORMAL LOW (ref 140–400)
RBC: 5.11 10*6/uL (ref 4.20–5.80)
RDW: 12.1 % (ref 11.0–15.0)
Total Lymphocyte: 39 %
WBC: 4.7 10*3/uL (ref 3.8–10.8)

## 2020-06-05 ENCOUNTER — Other Ambulatory Visit: Payer: Self-pay

## 2020-06-05 ENCOUNTER — Ambulatory Visit (INDEPENDENT_AMBULATORY_CARE_PROVIDER_SITE_OTHER): Payer: 59 | Admitting: Internal Medicine

## 2020-06-05 VITALS — BP 132/70 | HR 62 | Ht 69.0 in | Wt 239.0 lb

## 2020-06-05 DIAGNOSIS — I82452 Acute embolism and thrombosis of left peroneal vein: Secondary | ICD-10-CM | POA: Diagnosis not present

## 2020-06-05 DIAGNOSIS — I2609 Other pulmonary embolism with acute cor pulmonale: Secondary | ICD-10-CM | POA: Diagnosis not present

## 2020-06-05 DIAGNOSIS — I10 Essential (primary) hypertension: Secondary | ICD-10-CM | POA: Diagnosis not present

## 2020-06-05 DIAGNOSIS — M7989 Other specified soft tissue disorders: Secondary | ICD-10-CM | POA: Diagnosis not present

## 2020-06-05 DIAGNOSIS — E785 Hyperlipidemia, unspecified: Secondary | ICD-10-CM | POA: Diagnosis not present

## 2020-06-05 LAB — EXTERNAL GENERIC LAB PROCEDURE: COLOGUARD: NEGATIVE

## 2020-06-05 LAB — COLOGUARD: COLOGUARD: NEGATIVE

## 2020-06-05 NOTE — Progress Notes (Signed)
Cardiology Office Note:    Date:  06/05/2020   ID:  [name] 1965/12/25, MRN [id number]  PCP:  Joshua Macadam, MD  Cardiologist:  Elouise Munroe, MD  Electrophysiologist:  None   Referring MD: Joshua Post, MD   Chief Complaint/Reason for Referral: Leg swelling, bradycardia  History of Present Illness:    [name] is a 55 y.o. 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 He was hospitalized with bilateral PE with RV strain. He was started on Eliquis and is intended to continue this for at least 6 months.   Fluttering feeling in calf on Left. Few times a month. Otherwise symptoms overall stable. The patient denies chest pain, chest pressure, dyspnea at rest or with exertion, palpitations, PND, orthopnea, or leg swelling. Denies cough, fever, chills. Denies nausea, vomiting. Denies syncope or presyncope. Denies dizziness or lightheadedness.    Past Medical History:  Diagnosis Date   Anemia    History of avascular necrosis of capital femoral epiphysis 2006, 2007   Bilateral Femoral Head   HLD (hyperlipidemia)    Pneumonia    twice   Psoriatic arthritis [facility])     Past Surgical History:  Procedure Laterality Date   JOINT REPLACEMENT Bilateral 2006, 2007   Necrosis femoral head (bilateral)   TOTAL HIP REVISION Right 11/23/2016   Procedure: Acetabulum liner and femoral head revision;  Surgeon: Joshua Arabian, MD;  Location: [facility];  Service: Orthopedics;  Laterality: Right;   TOTAL HIP REVISION Left 04/10/2019   Procedure: Left hip bearing surface vs total hip arthroplasty revision;  Surgeon: Joshua Arabian, MD;  Location: [facility];  Service: Orthopedics;  Laterality: Left;  142min    Current Medications: Current Meds  Medication Sig   acetaminophen (TYLENOL) 500 MG tablet Take 1,000 mg by mouth every 8 (eight) hours as needed for mild pain or  moderate pain.    apixaban (ELIQUIS) 5 MG TABS tablet Take 1 tablet (5 mg total) by mouth 2 (two) times daily.   cetirizine (ZYRTEC) 10 MG tablet Take 1 tablet (10 mg total) by mouth daily.   cholecalciferol (VITAMIN D) 1000 units tablet Take 2,000 Units by mouth daily.    Coenzyme Q10 (COQ10) 100 MG CAPS Take 100 mg by mouth daily.    etanercept (ENBREL) 50 MG/ML injection Inject 1 syringe into the skin once a week.   Magnesium Oxide (MAG-200) 200 MG TABS Take 200 mg by mouth daily.   Melatonin 5 MG CAPS Take 5 mg by mouth at bedtime as needed (sleep.).    Omega-3 Fatty Acids (FISH OIL ULTRA) 1400 MG CAPS Take 1,400 mg by mouth daily.   TURMERIC PO Take 15 mLs by mouth daily.      Allergies:   Gold-containing drug products   Social History   Tobacco Use   Smoking status: Never Smoker   Smokeless tobacco: Never Used  Vaping Use   Vaping Use: Never used  Substance Use Topics   Alcohol use: Yes    Alcohol/week: 0.0 standard drinks    Comment: 3-4 drinks per week   Drug use: No     Family History: The patient's family history includes Pneumonia in his father. There is no history of Tremor.  ROS:   Please see the history of present illness.    All other systems reviewed and are negative.  EKGs/Labs/Other Studies Reviewed:    The  following studies were reviewed today:  EKG:  NSR rate 62  I have independently reviewed the images from n/a.  Recent Labs: 07/19/2019: B Natriuretic Peptide 20.1 07/21/2019: Magnesium 2.0 03/20/2020: TSH 1.62 06/03/2020: ALT 23; BUN 22; Creat 1.05; Hemoglobin 16.5; Platelets 133; Potassium 4.4; Sodium 140  Recent Lipid Panel    Component Value Date/Time   CHOL 214 (H) 03/20/2020 1559   TRIG 158 (H) 03/20/2020 1559   HDL 50 03/20/2020 1559   CHOLHDL 4.3 03/20/2020 1559   VLDL 16.8 03/21/2018 1029   LDLCALC 135 (H) 03/20/2020 1559   LDLDIRECT 133.7 06/24/2008 0857    Physical Exam:    VS:  BP 132/70    Pulse 62    Ht 5\' 9"  (1.753 m)    Wt  239 lb (108.4 kg)    SpO2 97%    BMI 35.29 kg/m     Wt Readings from Last 5 Encounters:  06/05/20 239 lb (108.4 kg)  04/15/20 235 lb 9.6 oz (106.9 kg)  02/25/20 230 lb 12.8 oz (104.7 kg)  11/26/19 (!) 235 lb 6.4 oz (106.8 kg)  11/13/19 236 lb 3.2 oz (107.1 kg)    Constitutional: No acute distress Eyes: sclera non-icteric, normal conjunctiva and lids ENMT: normal dentition, moist mucous membranes Cardiovascular: regular rhythm, normal rate, no murmurs. S1 and S2 normal. Radial pulses normal bilaterally. No jugular venous distention.  Respiratory: clear to auscultation bilaterally GI : normal bowel sounds, soft and nontender. No distention.   MSK: extremities warm, well perfused. No edema.  NEURO: grossly nonfocal exam, moves all extremities. PSYCH: alert and oriented x 3, normal mood and affect.   ASSESSMENT:    1. Other acute pulmonary embolism with acute cor pulmonale ([place])   2. Acute deep vein thrombosis (DVT) of left peroneal vein (HCC)   3. Left leg swelling   4. Essential hypertension   5. Hyperlipidemia, unspecified hyperlipidemia type    PLAN:    Other acute pulmonary embolism with acute cor pulmonale (HCC) Acute deep vein thrombosis (DVT) of left peroneal vein (HCC) Left leg swelling - continues on eliquis 5 mg BID per patient preference. Hematology/rheumatology review recommended for risk factors for continuation. DVT provoked by illness however was extensive with bilateral PE.   Essential hypertension - Plan: EKG 12-Lead - stable, no change to medications today.   Hyperlipidemia, unspecified hyperlipidemia type - LDL elevated, consider addition of statin with coronary artery calcifications on CT.  Total time of encounter: 30 minutes total time of encounter, including 20 minutes spent in face-to-face patient care on the date of this encounter. This time includes coordination of care and counseling regarding above mentioned problem list. Remainder of non-face-to-face  time involved reviewing chart documents/testing relevant to the patient encounter and documentation in the medical record. I have independently reviewed documentation from referring provider.   Joshua Kaiser, MD [place]   [facility]    Medication Adjustments/Labs and Tests Ordered: Current medicines are reviewed at length with the patient today.  Concerns regarding medicines are outlined above.   Orders Placed This Encounter  Procedures   EKG 12-Lead    Shared Decision Making/Informed Consent:       No orders of the defined types were placed in this encounter.   Patient Instructions  Medication Instructions:  No Changes In Medications at this time.  *If you need a refill on your cardiac medications before your next appointment, please call your pharmacy*  Follow-Up: At [facility], you and your health needs are  our priority.  As part of our continuing mission to provide you with exceptional heart care, we have created designated Provider Care Teams.  These Care Teams include your primary Cardiologist (physician) and Advanced Practice Providers (APPs -  Physician Assistants and Nurse Practitioners) who all work together to provide you with the care you need, when you need it.  Your next appointment:   6 month(s)  The format for your next appointment:   In Person  Provider:   Cherlynn Kaiser, MD

## 2020-06-05 NOTE — Patient Instructions (Signed)

## 2020-06-09 DIAGNOSIS — L821 Other seborrheic keratosis: Secondary | ICD-10-CM | POA: Diagnosis not present

## 2020-06-09 DIAGNOSIS — D1801 Hemangioma of skin and subcutaneous tissue: Secondary | ICD-10-CM | POA: Diagnosis not present

## 2020-06-09 DIAGNOSIS — D225 Melanocytic nevi of trunk: Secondary | ICD-10-CM | POA: Diagnosis not present

## 2020-06-09 DIAGNOSIS — L814 Other melanin hyperpigmentation: Secondary | ICD-10-CM | POA: Diagnosis not present

## 2020-06-12 ENCOUNTER — Encounter: Payer: Self-pay | Admitting: Family Medicine

## 2020-06-24 MED FILL — ENBREL 50 MG/ML SOSY: 50 | 28 days supply | Qty: 4 | Fill #1

## 2020-07-22 ENCOUNTER — Other Ambulatory Visit: Payer: Self-pay

## 2020-07-22 ENCOUNTER — Other Ambulatory Visit: Payer: Self-pay | Admitting: Internal Medicine

## 2020-07-22 MED ORDER — APIXABAN 5 MG PO TABS: 5.0000 mg | ORAL_TABLET | Freq: Two times a day (BID) | ORAL | 1 refills | Status: DC

## 2020-07-22 MED FILL — ENBREL 50 MG/ML SOSY: 50 | 28 days supply | Qty: 4 | Fill #2

## 2020-07-22 MED FILL — ELIQUIS 5 MG TABLET: 5 | 90 days supply | Qty: 180 | Fill #0

## 2020-07-28 ENCOUNTER — Other Ambulatory Visit (HOSPITAL_COMMUNITY): Payer: Self-pay

## 2020-08-07 ENCOUNTER — Other Ambulatory Visit (HOSPITAL_COMMUNITY): Payer: Self-pay

## 2020-08-10 ENCOUNTER — Ambulatory Visit: Payer: 59 | Attending: Family Medicine | Admitting: Pharmacist

## 2020-08-10 DIAGNOSIS — Z79899 Other long term (current) drug therapy: Secondary | ICD-10-CM

## 2020-08-10 NOTE — Progress Notes (Signed)
   S: Patient presents virtually today for review of his specialty medication.  Patient is currently taking Enbrel for psoriatic arthritis. Patient is managed by Dr. Estanislado Pandy for this.   Adherence: confirmed  Efficacy: continues to work well for him  Dosing: Enbrel 50 mg weekly  Drug-drug interactions: none  Screening: TB test: completed per patient (negative) Hepatitis: completed per patient  Monitoring: S/sx of infection: denies CBC: done q3 months, see below S/sx of hypersensitivity: denies S/sx of malignancy: denies S/sx of heart failure: denies  Of note, he endorses fatigue that has increased recently over the last couple of months. He does not know if this is due to the Enbrel or something else.   O: Lab Results  Component Value Date   WBC 4.7 06/03/2020   HGB 16.5 06/03/2020   HCT 47.8 06/03/2020   MCV 93.5 06/03/2020   PLT 133 (L) 06/03/2020      Chemistry      Component Value Date/Time   NA 140 06/03/2020 1025   NA 139 07/30/2019 0955   K 4.4 06/03/2020 1025   CL 104 06/03/2020 1025   CO2 25 06/03/2020 1025   BUN 22 06/03/2020 1025   BUN 21 07/30/2019 0955   CREATININE 1.05 06/03/2020 1025      Component Value Date/Time   CALCIUM 9.7 06/03/2020 1025   ALKPHOS 97 07/30/2019 0955   AST 21 06/03/2020 1025   ALT 23 06/03/2020 1025   BILITOT 0.7 06/03/2020 1025   BILITOT 0.5 07/30/2019 0955       A/P: 1. Medication review: patient on Enbrel for psoriatic arthritis and is tolerating it well.  No questions or concerns about medication. Reviewed the medication with him, including the increase risk of infection, the need for close monitoring of lab work, the possible increased risk of malignancy, and the risk of injection site reactions. He endorses increased fatigue over the past couple of months. I recommended that he discuss this with his specialist coming up in May. No recommendations for any changes.   Benard Halsted, PharmD, [place] [phone]

## 2020-08-19 ENCOUNTER — Other Ambulatory Visit (HOSPITAL_COMMUNITY): Payer: Self-pay

## 2020-08-19 ENCOUNTER — Other Ambulatory Visit: Payer: Self-pay | Admitting: Family Medicine

## 2020-08-20 ENCOUNTER — Other Ambulatory Visit: Payer: Self-pay | Admitting: Rheumatology

## 2020-08-20 ENCOUNTER — Other Ambulatory Visit: Payer: Self-pay | Admitting: Pharmacist

## 2020-08-20 ENCOUNTER — Other Ambulatory Visit (HOSPITAL_COMMUNITY): Payer: Self-pay

## 2020-08-20 MED ORDER — ENBREL 50 MG/ML ~~LOC~~ SOSY
PREFILLED_SYRINGE | SUBCUTANEOUS | 2 refills | Status: DC
  Filled 2020-08-20: qty 4, 28d supply, fill #0

## 2020-08-20 MED ORDER — ENBREL 50 MG/ML ~~LOC~~ SOSY
PREFILLED_SYRINGE | SUBCUTANEOUS | 2 refills | Status: DC
  Filled 2020-08-20: qty 4, 28d supply, fill #0
  Filled 2020-09-15: qty 4, 28d supply, fill #1
  Filled 2020-10-14: qty 4, 28d supply, fill #2

## 2020-08-20 NOTE — Telephone Encounter (Signed)
Next Visit: 45/18/2022  Last Visit: 04/15/2020  Last Fill: 05/25/2020  DX: Psoriatic arthritis   Current Dose per office note 04/15/2020, Enbrel 50 mg sq injections every 7 days  Labs: 06/03/2020, CMP WNL. Platelet count is borderline low but stable-133. Rest of CBC WNL  TB Gold: 02/13/2020, negative  Okay to refill Enbrel?

## 2020-08-24 ENCOUNTER — Other Ambulatory Visit (HOSPITAL_COMMUNITY): Payer: Self-pay

## 2020-08-25 ENCOUNTER — Inpatient Hospital Stay: Payer: 59 | Attending: Oncology | Admitting: Oncology

## 2020-08-25 ENCOUNTER — Other Ambulatory Visit (HOSPITAL_COMMUNITY): Payer: Self-pay

## 2020-08-25 ENCOUNTER — Other Ambulatory Visit: Payer: Self-pay

## 2020-08-25 VITALS — BP 149/89 | HR 77 | Temp 96.9°F | Resp 17 | Wt 241.9 lb

## 2020-08-25 DIAGNOSIS — I82402 Acute embolism and thrombosis of unspecified deep veins of left lower extremity: Secondary | ICD-10-CM | POA: Diagnosis not present

## 2020-08-25 DIAGNOSIS — Z8616 Personal history of COVID-19: Secondary | ICD-10-CM | POA: Diagnosis not present

## 2020-08-25 DIAGNOSIS — Z86711 Personal history of pulmonary embolism: Secondary | ICD-10-CM | POA: Insufficient documentation

## 2020-08-25 DIAGNOSIS — Z7901 Long term (current) use of anticoagulants: Secondary | ICD-10-CM | POA: Insufficient documentation

## 2020-08-25 NOTE — Progress Notes (Signed)
Hematology and Oncology Follow Up Visit  [name] [id number] Sep 24, 1965 55 y.o. 08/25/2020 10:04 AM [name], MDBurchette, Joshua Sierras, MD   Principle Diagnosis: 55 year old man with venous thromboembolism in the setting of COVID-19 infection in March 2021.  He was found to have bilateral pulmonary emboli at that time.   Current therapy: Full dose anticoagulation with Eliquis 5 mg twice daily started in March 2021.  Interim History: [name] presents today for return evaluation.  Since the last visit, he reports no major changes in his health.  He continues to be on Eliquis without any new side effects.  He denies any thrombosis or bleeding issues.  He does report some mild fatigue and tiredness but no shortness of breath or difficulty breathing.  He denies any worsening edema.     Medications: Updated on review. Current Outpatient Medications  Medication Sig Dispense Refill  . acetaminophen (TYLENOL) 500 MG tablet Take 1,000 mg by mouth every 8 (eight) hours as needed for mild pain or moderate pain.     [name] apixaban (ELIQUIS) 5 MG TABS tablet TAKE 1 TABLET BY MOUTH 2 TIMES DAILY 180 tablet 1  . cetirizine (ZYRTEC) 10 MG tablet Take 1 tablet (10 mg total) by mouth daily. 30 tablet 11  . cholecalciferol (VITAMIN D) 1000 units tablet Take 2,000 Units by mouth daily.     . Coenzyme Q10 (COQ10) 100 MG CAPS Take 100 mg by mouth daily.     [name] etanercept (ENBREL) 50 MG/ML injection INJECT 1 SYRINGE INTO THE SKIN ONCE A WEEK. 4 mL 2  . Magnesium Oxide (MAG-200) 200 MG TABS Take 200 mg by mouth daily.    . Melatonin 5 MG CAPS Take 5 mg by mouth at bedtime as needed (sleep.).     [name] Omega-3 Fatty Acids (FISH OIL ULTRA) 1400 MG CAPS Take 1,400 mg by mouth daily.    . TURMERIC PO Take 15 mLs by mouth daily.      No current facility-administered medications for this visit.     Allergies:  Allergies  Allergen Reactions  . Gold-Containing Drug Products Rash    Denies oral or airway  involvement - occurred in the 1980s.       Physical Exam: Blood pressure (!) 149/89, pulse 77, temperature (!) 96.9 F (36.1 C), temperature source Tympanic, resp. rate 17, weight 241 lb 14.4 oz (109.7 kg), SpO2 96 %.    ECOG: 0    General appearance: Alert, awake without any distress. Head: Atraumatic without abnormalities Oropharynx: Without any thrush or ulcers. Eyes: No scleral icterus. Lymph nodes: No lymphadenopathy noted in the cervical, supraclavicular, or axillary nodes Heart:regular rate and rhythm, without any murmurs or gallops.   Lung: Clear to auscultation without any rhonchi, wheezes or dullness to percussion. Abdomin: Soft, nontender without any shifting dullness or ascites. Musculoskeletal: No clubbing or cyanosis. Neurological: No motor or sensory deficits. Skin: No rashes or lesions.     Lab Results: Lab Results  Component Value Date   WBC 4.7 06/03/2020   HGB 16.5 06/03/2020   HCT 47.8 06/03/2020   MCV 93.5 06/03/2020   PLT 133 (L) 06/03/2020     Chemistry      Component Value Date/Time   NA 140 06/03/2020 1025   NA 139 07/30/2019 0955   K 4.4 06/03/2020 1025   CL 104 06/03/2020 1025   CO2 25 06/03/2020 1025   BUN 22 06/03/2020 1025   BUN 21 07/30/2019 0955   CREATININE 1.05 06/03/2020  1025      Component Value Date/Time   CALCIUM 9.7 06/03/2020 1025   ALKPHOS 97 07/30/2019 0955   AST 21 06/03/2020 1025   ALT 23 06/03/2020 1025   BILITOT 0.7 06/03/2020 1025   BILITOT 0.5 07/30/2019 0955        Impression and Plan:   55 year old with:  1.  Venous thromboembolism diagnosed in March 2021.  He presented with bilateral PE in the setting of a COVID-19 infection.   He is currently on Eliquis without any major complications.  Risks and benefits of continuing anticoagulation lifetime versus discontinuation were discussed.  It is reasonable to consider discontinuation of therapy after extended anticoagulation in a provoked setting such  as COVID-19 infection.  After discussion today, he prefers to keep anticoagulation at this time.  We also discussed the role of reducing Eliquis to 5 mg daily for less side effects although it might offer less protection from recurrent thrombosis.  After discussion today, he is agreeable to continue with Eliquis at 5 mg daily with this current dose reduction.  We will reevaluate in 6 months after that.   2.  COVID-19 vaccination: He is up-to-date which should offer him protection for recurrent infection in the future  3.  Follow-up: In 6 months for repeat follow-up.  30 minutes were spent on this visit.  The time was dedicated to reviewing laboratory data, disease status update and outlining future plan of care.    Zola Button, MD 4/26/202210:04 AM

## 2020-08-26 ENCOUNTER — Telehealth: Payer: Self-pay | Admitting: Oncology

## 2020-08-26 NOTE — Telephone Encounter (Signed)
Scheduled appt per 4/26 los. Pt aware.

## 2020-09-01 ENCOUNTER — Ambulatory Visit (INDEPENDENT_AMBULATORY_CARE_PROVIDER_SITE_OTHER): Payer: 59 | Admitting: Podiatry

## 2020-09-01 ENCOUNTER — Other Ambulatory Visit: Payer: Self-pay

## 2020-09-01 ENCOUNTER — Encounter: Payer: Self-pay | Admitting: Podiatry

## 2020-09-01 DIAGNOSIS — Z79899 Other long term (current) drug therapy: Secondary | ICD-10-CM

## 2020-09-01 DIAGNOSIS — M2042 Other hammer toe(s) (acquired), left foot: Secondary | ICD-10-CM

## 2020-09-01 DIAGNOSIS — M2041 Other hammer toe(s) (acquired), right foot: Secondary | ICD-10-CM | POA: Diagnosis not present

## 2020-09-01 DIAGNOSIS — L84 Corns and callosities: Secondary | ICD-10-CM

## 2020-09-01 NOTE — Progress Notes (Signed)
This patient presents the office with chief complaint of painful callus on his right foot.  Patient states that there is a painful callus on the tip of the third and fifth toes right foot.  He says these are painful walking and wearing his shoes.  He also has a callus on his big toe right foot.  He presents the office today for an evaluation and treatment of these painful callus.  Patient is presently taking Eliquis.  Vascular  Dorsalis pedis  Are weakly  palpable  B/L. Posterior tibial pulses are absent  B/L. Capillary return  WNL.  Temperature gradient is  WNL.  Skin turgor  WNL  Sensorium  Senn Weinstein monofilament wire  WNL. Normal tactile sensation.  Nail Exam  Patient has normal nails with no evidence of bacterial or fungal infection with the exception right hallux which is thick and deformed.  Orthopedic  Exam  Muscle tone and muscle strength  WNL.  No limitations of motion feet  B/L.  No crepitus or joint effusion noted.  Retrocalcaneal exostosis right foot.  Severely rigid hammer toes 2-5  B/L  Skin  No open lesions.  Normal skin texture and turgor. Callus distal aspect 3rd toe right.  Listers corn fifth toe right.  Pinch callus hallux  Right.  Skin lesion pinch callus left hallux asymptomatic.    Callus secondary to hammer toes  B/L.     Debride callus with # 15 blade followed by dremel use.    RTC 3 months    Gardiner Barefoot DPM

## 2020-09-02 NOTE — Progress Notes (Signed)
Office Visit Note  Patient: [name]             Date of Birth: [birth year]           MRN: [id number]             PCP: Caren Macadam, MD Referring: Caren Macadam, MD Visit Date: 09/16/2020 Occupation: @GUAROCC @  Subjective:  Joint stiffness.   History of Present Illness: [name] is a 55 y.o. male with history of psoriatic arthritis and osteoarthritis.  He states he continues to have some stiffness in his joints.  He has not noticed any joint swelling.  He denies any psoriasis lesions.  He has some discomfort off and on in his lower back.  He notices back discomfort after prolonged driving.  He has been taking Enbrel on weekly basis.  He has been experiencing increased fatigue recently.  He states the fatigue was related to Eliquis.  Since he decreased the dose of Eliquis, fatigue has improved some.  Activities of Daily Living:  Patient reports morning stiffness for less than 1 minute.   Patient Denies nocturnal pain.  Difficulty dressing/grooming: Denies Difficulty climbing stairs: Reports Difficulty getting out of chair: Reports Difficulty using hands for taps, buttons, cutlery, and/or writing: Denies  Review of Systems  Constitutional: Positive for fatigue. Negative for night sweats.  HENT: Negative for mouth sores, mouth dryness and nose dryness.   Eyes: Negative for pain, redness, itching and dryness.  Respiratory: Negative for shortness of breath and difficulty breathing.   Cardiovascular: Negative for chest pain, palpitations, hypertension, irregular heartbeat and swelling in legs/feet.  Gastrointestinal: Negative for blood in stool, constipation and diarrhea.  Endocrine: Negative for increased urination.  Genitourinary: Negative for difficulty urinating.  Musculoskeletal: Positive for morning stiffness. Negative for arthralgias, joint pain, joint swelling, myalgias, muscle weakness, muscle tenderness and myalgias.  Skin: Negative for color change,  rash, hair loss, nodules/bumps, redness, skin tightness, ulcers and sensitivity to sunlight.  Allergic/Immunologic: Negative for susceptible to infections.  Neurological: Negative for dizziness, fainting, numbness, headaches, memory loss, night sweats and weakness.  Hematological: Negative for bruising/bleeding tendency and swollen glands.  Psychiatric/Behavioral: Negative for depressed mood, confusion and sleep disturbance. The patient is not nervous/anxious.     PMFS History:  Patient Active Problem List   Diagnosis Date Noted  . Corns and callosities 01/15/2020  . Hammer toes, bilateral 01/15/2020  . Orthopnea 11/01/2019  . History of COVID-19 07/30/2019  . Acute deep vein thrombosis (DVT) of left lower extremity ([place]) 07/30/2019  . Abnormal CT of the chest 07/30/2019  . Cough 07/30/2019  . Leg DVT (deep venous thromboembolism), acute, bilateral ([place]) 07/24/2019  . HTN (hypertension) 07/24/2019  . COVID-19 virus infection 07/20/2019  . Acute respiratory failure with hypoxia ([place]) 07/20/2019  . Acute pulmonary embolism ([place]) 07/19/2019  . Essential tremor 04/03/2017  . Failed total hip arthroplasty ([place]) 11/23/2016  . High risk medications (not anticoagulants) long-term use 09/20/2016  . Bilateral hand pain 09/20/2016  . DDD (degenerative disc disease), cervical 09/20/2016  . History of total hip replacement, bilateral 09/20/2016  . DDD (degenerative disc disease), lumbar 09/20/2016  . Bilateral plantar fasciitis 09/20/2016  . Psoriasis 09/20/2016  . Primary osteoarthritis of both feet 09/20/2016  . Primary osteoarthritis of both hands 09/20/2016  . Primary osteoarthritis of both knees 09/20/2016  . Prediabetes 09/22/2014  . Hyperlipidemia 09/22/2014  . URI (upper respiratory infection) 07/07/2012  . Acute bronchitis 07/07/2012  . OBESITY 07/03/2008  . AVASCULAR  NECROSIS, FEMORAL HEAD 07/03/2008  . Psoriatic arthritis ([place]) 05/02/2001    Past Medical History:  Diagnosis  Date  . Anemia   . History of avascular necrosis of capital femoral epiphysis 2006, 2007   Bilateral Femoral Head  . HLD (hyperlipidemia)   . Pneumonia    twice  . Psoriatic arthritis ([place])     Family History  Problem Relation Age of Onset  . Pneumonia Father   . Tremor Neg Hx    Past Surgical History:  Procedure Laterality Date  . JOINT REPLACEMENT Bilateral 2006, 2007   Necrosis femoral head (bilateral)  . TOTAL HIP REVISION Right 11/23/2016   Procedure: Acetabulum liner and femoral head revision;  Surgeon: [name], MD;  Location: [facility];  Service: Orthopedics;  Laterality: Right;  . TOTAL HIP REVISION Left 04/10/2019   Procedure: Left hip bearing surface vs total hip arthroplasty revision;  Surgeon: [name], MD;  Location: [facility];  Service: Orthopedics;  Laterality: Left;  129min   Social History   Social History Narrative   Lives at home w/ his wife   Right-handed   Caffeine: 2-3 cups per day   Immunization History  Administered Date(s) Administered  . Influenza Inj Mdck Quad Pf 02/01/2019  . Influenza,inj,Quad PF,6+ Mos 02/03/2017, 03/21/2018  . Influenza,inj,quad, With Preservative 02/01/2019  . Influenza-Unspecified 02/03/2017, 03/21/2018, 02/01/2019  . PFIZER(Purple Top)SARS-COV-2 Vaccination 10/06/2019, 10/27/2019  . Pneumococcal Conjugate-13 03/21/2018  . Tdap 09/22/2014  . Zoster 04/02/2018, 05/23/2018  . Zoster Recombinat (Shingrix) 04/02/2018, 05/23/2018     Objective: Vital Signs: BP (!) 163/91 (BP Location: Left Arm, Patient Position: Sitting, Cuff Size: Normal)   Pulse 69   Ht 5\' 9"  (1.753 m)   Wt 230 lb (104.3 kg) Comment: per patient, patient refused to weigh  BMI 33.97 kg/m    Physical Exam Vitals and nursing note reviewed.  Constitutional:      Appearance: He is well-developed.  HENT:     Head: Normocephalic and atraumatic.  Eyes:     Conjunctiva/sclera: Conjunctivae normal.     Pupils: Pupils are equal, round, and reactive to  light.  Cardiovascular:     Rate and Rhythm: Normal rate and regular rhythm.     Heart sounds: Normal heart sounds.  Pulmonary:     Effort: Pulmonary effort is normal.     Breath sounds: Normal breath sounds.  Abdominal:     General: Bowel sounds are normal.     Palpations: Abdomen is soft.  Musculoskeletal:     Cervical back: Normal range of motion and neck supple.  Skin:    General: Skin is warm and dry.     Capillary Refill: Capillary refill takes less than 2 seconds.  Neurological:     Mental Status: He is alert and oriented to person, place, and time.  Psychiatric:        Behavior: Behavior normal.      Musculoskeletal Exam: He has limited range of motion of cervical spine.  He had limited range of motion of thoracic and lumbar spine.  He has contractures in his bilateral elbows without any synovitis.  His limited range of motion of his wrist joints with no synovitis.  He has incomplete fist formation.  He has thickening of PIP and DIP joints with no synovitis.  Hip joints and knee joints with good range of motion.  He has no tenderness over ankles or MTPs.  There is no evidence of plantar fasciitis or Achilles tendinitis. CDAI Exam: CDAI Score: --  Patient Global: --; Provider Global: -- Swollen: --; Tender: -- Joint Exam 09/16/2020   No joint exam has been documented for this visit   There is currently no information documented on the homunculus. Go to the Rheumatology activity and complete the homunculus joint exam.  Investigation: No additional findings.  Imaging: No results found.  Recent Labs: Lab Results  Component Value Date   WBC 4.7 06/03/2020   HGB 16.5 06/03/2020   PLT 133 (L) 06/03/2020   NA 140 06/03/2020   K 4.4 06/03/2020   CL 104 06/03/2020   CO2 25 06/03/2020   GLUCOSE 77 06/03/2020   BUN 22 06/03/2020   CREATININE 1.05 06/03/2020   BILITOT 0.7 06/03/2020   ALKPHOS 97 07/30/2019   AST 21 06/03/2020   ALT 23 06/03/2020   PROT 7.1 06/03/2020    ALBUMIN 4.6 07/30/2019   CALCIUM 9.7 06/03/2020   GFRAA 92 06/03/2020   QFTBGOLDPLUS NEGATIVE 02/13/2020    Speciality Comments: No specialty comments available.  Procedures:  No procedures performed Allergies: Gold-containing drug products   Assessment / Plan:     Visit Diagnoses: Psoriatic arthritis (HCC)-he is clinically doing well on Enbrel.  He had no synovitis on examination.  He has contractures in multiple joints and limited range of motion due to previous damage.  He continues to have some stiffness.  Psoriasis-no active psoriasis lesions were noted.  High risk medication use - Enbrel 50 mg sq injections every 7 days.  His labs are due today.  We will check labs today and then every 3 months to monitor for drug toxicity.  Last TB Gold was on February 13, 2020.  He has been advised to stop Enbrel in case he develops an infection.  He may resume Enbrel after infection resolves.  He has been also advised to get updated on his immunization status.  Instructions were placed in the AVS.  Annual skin examination to screen for nonmelanoma skin cancer was advised.  Primary osteoarthritis of both hands-he has decreased range of motion and stiffness.  History of total hip replacement, bilateral-doing well.  He had fairly good range of motion bilateral hip joints.  Primary osteoarthritis of both knees-he has some chronic discomfort but no warmth swelling effusion was noted.  Primary osteoarthritis of both feet-no synovitis was noted.  DDD (degenerative disc disease), cervical-he has limited range of motion of cervical spine with stiffness.  DDD (degenerative disc disease), lumbar-he has limited range of motion with chronic discomfort.  Other fatigue-he has been experiencing increased fatigue.  He relates it to the use of Eliquis.  He has noticed improvement in the fatigue since his decreased Eliquis dose.  History of vitamin D deficiency-use of vitamin D supplement was  discussed.  History of prediabetes  History of hypertension  History of hyperlipidemia-increased risk of heart disease with inflammatory arthritis was discussed.  Dietary modifications and exercise was discussed and the handout was placed in the AVS.  History of COVID-19 infection March 2021  History of pulmonary embolism-after COVID-19 infection March 2021-  Orders: No orders of the defined types were placed in this encounter.  No orders of the defined types were placed in this encounter.    Follow-Up Instructions: Return in about 5 months (around 02/16/2021) for Psoriatic arthritis, Osteoarthritis.   Bo Merino, MD  Note - This record has been created using Editor, commissioning.  Chart creation errors have been sought, but may not always  have been located. Such creation errors do not reflect on  the standard of medical care. 

## 2020-09-15 ENCOUNTER — Other Ambulatory Visit (HOSPITAL_COMMUNITY): Payer: Self-pay

## 2020-09-16 ENCOUNTER — Other Ambulatory Visit: Payer: Self-pay

## 2020-09-16 ENCOUNTER — Ambulatory Visit (INDEPENDENT_AMBULATORY_CARE_PROVIDER_SITE_OTHER): Payer: 59 | Admitting: Rheumatology

## 2020-09-16 ENCOUNTER — Encounter: Payer: Self-pay | Admitting: Rheumatology

## 2020-09-16 VITALS — BP 163/91 | HR 69 | Ht 69.0 in | Wt 230.0 lb

## 2020-09-16 DIAGNOSIS — L409 Psoriasis, unspecified: Secondary | ICD-10-CM

## 2020-09-16 DIAGNOSIS — Z96643 Presence of artificial hip joint, bilateral: Secondary | ICD-10-CM | POA: Diagnosis not present

## 2020-09-16 DIAGNOSIS — Z87898 Personal history of other specified conditions: Secondary | ICD-10-CM

## 2020-09-16 DIAGNOSIS — Z79899 Other long term (current) drug therapy: Secondary | ICD-10-CM | POA: Diagnosis not present

## 2020-09-16 DIAGNOSIS — R202 Paresthesia of skin: Secondary | ICD-10-CM

## 2020-09-16 DIAGNOSIS — M5136 Other intervertebral disc degeneration, lumbar region: Secondary | ICD-10-CM | POA: Diagnosis not present

## 2020-09-16 DIAGNOSIS — M19041 Primary osteoarthritis, right hand: Secondary | ICD-10-CM

## 2020-09-16 DIAGNOSIS — M19042 Primary osteoarthritis, left hand: Secondary | ICD-10-CM

## 2020-09-16 DIAGNOSIS — L4050 Arthropathic psoriasis, unspecified: Secondary | ICD-10-CM

## 2020-09-16 DIAGNOSIS — M19072 Primary osteoarthritis, left ankle and foot: Secondary | ICD-10-CM

## 2020-09-16 DIAGNOSIS — M5030 Other cervical disc degeneration, unspecified cervical region: Secondary | ICD-10-CM

## 2020-09-16 DIAGNOSIS — M19071 Primary osteoarthritis, right ankle and foot: Secondary | ICD-10-CM

## 2020-09-16 DIAGNOSIS — Z86711 Personal history of pulmonary embolism: Secondary | ICD-10-CM

## 2020-09-16 DIAGNOSIS — M170 Bilateral primary osteoarthritis of knee: Secondary | ICD-10-CM

## 2020-09-16 DIAGNOSIS — Z8679 Personal history of other diseases of the circulatory system: Secondary | ICD-10-CM

## 2020-09-16 DIAGNOSIS — R5383 Other fatigue: Secondary | ICD-10-CM

## 2020-09-16 DIAGNOSIS — Z8616 Personal history of COVID-19: Secondary | ICD-10-CM

## 2020-09-16 DIAGNOSIS — Z8639 Personal history of other endocrine, nutritional and metabolic disease: Secondary | ICD-10-CM

## 2020-09-16 NOTE — Patient Instructions (Signed)
Standing Labs We placed an order today for your standing lab work.   Please have your standing labs drawn in August and every 3 months  If possible, please have your labs drawn 2 weeks prior to your appointment so that the provider can discuss your results at your appointment.  We have open lab daily Monday through Thursday from 1:30-4:30 PM and Friday from 1:30-4:00 PM at the office of Dr. Bo Merino, [facility].   Please be advised, all patients with office appointments requiring lab work will take precedents over walk-in lab work.  If possible, please come for your lab work on Monday and Friday afternoons, as you may experience shorter wait times. The office is located at [address] [place], [place], [place] [address] No appointment is necessary.   Labs are drawn by [facility]. Please bring your co-pay at the time of your lab draw.  You may receive a bill from [facility] for your lab work.  If you wish to have your labs drawn at another location, please call the office 24 hours in advance to send orders.  If you have any questions regarding directions or hours of operation,  please call [phone].   As a reminder, please drink plenty of water prior to coming for your lab work. Thanks!  Vaccines You are taking a medication(s) that can suppress your immune system.  The following immunizations are recommended: . Flu annually . Covid-19  . Pneumonia (Pneumovax 23 and Prevnar 13 spaced at least 1 year apart) . Shingrix (after age 73)  Please check with your PCP to make sure you are up to date.   Please discontinue Enbrel if you develop an infection.  You may resume Enbrel once the infection resolves.  It is recommended that you see dermatologist once a year to screen for nonmelanoma skin cancer.  COVID-19 vaccine recommendations:   COVID-19 vaccine is recommended for everyone (unless you are allergic to a vaccine component), even if you are on a medication that  suppresses your immune system.   It is recommended that patients on immunosuppressive therapy should receive their first 3 COVID-19 vaccines 1 month apart and then a fourth dose (booster) 3 months after the third dose.  Do not take Tylenol or any anti-inflammatory medications (NSAIDs) 24 hours prior to the COVID-19 vaccination.   There is no direct evidence about the efficacy of the COVID-19 vaccine in individuals who are on medications that suppress the immune system.   Even if you are fully vaccinated, and you are on any medications that suppress your immune system, please continue to wear a mask, maintain at least six feet social distance and practice hand hygiene.   If you develop a COVID-19 infection, please contact your PCP or our office to determine if you need monoclonal antibody infusion.  The booster vaccine is now available for immunocompromised patients.   Please see the following web sites for updated information.   [email or web address]-Patient-[email or web address]  Heart Disease Prevention   Your inflammatory disease increases your risk of heart disease which includes heart attack, stroke, atrial fibrillation (irregular heartbeats), high blood pressure, heart failure and atherosclerosis (plaque in the arteries).  It is important to reduce your risk by:   . Keep blood pressure, cholesterol, and blood sugar at healthy levels   . Smoking Cessation   . Maintain a healthy weight  o BMI 20-25   . Eat a healthy diet  o Plenty of fresh fruit, vegetables, and whole grains  o Limit saturated  fats, foods high in sodium, and added sugars  o DASH and Mediterranean diet   . Increase physical activity  o Recommend moderate physically activity for 150 minutes per week/ 30 minutes a day for five days a week These can be broken up into three separate ten-minute sessions during the day.   . Reduce Stress  . Meditation, slow breathing exercises,  yoga, coloring books  . Dental visits twice a year

## 2020-09-17 LAB — CBC WITH DIFFERENTIAL/PLATELET
Absolute Monocytes: 270 cells/uL (ref 200–950)
Basophils Absolute: 20 cells/uL (ref 0–200)
Basophils Relative: 0.4 %
Eosinophils Absolute: 30 cells/uL (ref 15–500)
Eosinophils Relative: 0.6 %
HCT: 49.2 % (ref 38.5–50.0)
Hemoglobin: 16.5 g/dL (ref 13.2–17.1)
Lymphs Abs: 1780 cells/uL (ref 850–3900)
MCH: 31.9 pg (ref 27.0–33.0)
MCHC: 33.5 g/dL (ref 32.0–36.0)
MCV: 95 fL (ref 80.0–100.0)
MPV: 11.4 fL (ref 7.5–12.5)
Monocytes Relative: 5.4 %
Neutro Abs: 2900 cells/uL (ref 1500–7800)
Neutrophils Relative %: 58 %
Platelets: 122 10*3/uL — ABNORMAL LOW (ref 140–400)
RBC: 5.18 10*6/uL (ref 4.20–5.80)
RDW: 12.9 % (ref 11.0–15.0)
Total Lymphocyte: 35.6 %
WBC: 5 10*3/uL (ref 3.8–10.8)

## 2020-09-17 LAB — COMPLETE METABOLIC PANEL WITH GFR
AG Ratio: 1.8 (calc) (ref 1.0–2.5)
ALT: 28 U/L (ref 9–46)
AST: 22 U/L (ref 10–35)
Albumin: 4.7 g/dL (ref 3.6–5.1)
Alkaline phosphatase (APISO): 69 U/L (ref 35–144)
BUN: 25 mg/dL (ref 7–25)
CO2: 26 mmol/L (ref 20–32)
Calcium: 9.7 mg/dL (ref 8.6–10.3)
Chloride: 103 mmol/L (ref 98–110)
Creat: 1.02 mg/dL (ref 0.70–1.33)
GFR, Est African American: 94 mL/min/{1.73_m2} (ref >=60)
GFR, Est Non African American: 81 mL/min/{1.73_m2} (ref >=60)
Globulin: 2.6 g/dL (calc) (ref 1.9–3.7)
Glucose, Bld: 87 mg/dL (ref 65–99)
Potassium: 4.7 mmol/L (ref 3.5–5.3)
Sodium: 139 mmol/L (ref 135–146)
Total Bilirubin: 1 mg/dL (ref 0.2–1.2)
Total Protein: 7.3 g/dL (ref 6.1–8.1)

## 2020-09-17 NOTE — Progress Notes (Signed)
CMP is normal, CBC shows low platelets but is stable.

## 2020-09-19 ENCOUNTER — Other Ambulatory Visit (HOSPITAL_COMMUNITY): Payer: Self-pay

## 2020-09-21 ENCOUNTER — Other Ambulatory Visit (HOSPITAL_COMMUNITY): Payer: Self-pay

## 2020-10-14 ENCOUNTER — Other Ambulatory Visit (HOSPITAL_COMMUNITY): Payer: Self-pay

## 2020-11-13 ENCOUNTER — Other Ambulatory Visit (HOSPITAL_COMMUNITY): Payer: Self-pay

## 2020-11-23 ENCOUNTER — Other Ambulatory Visit (HOSPITAL_BASED_OUTPATIENT_CLINIC_OR_DEPARTMENT_OTHER): Payer: Self-pay

## 2020-11-23 MED ORDER — COVID-19 AT HOME ANTIGEN TEST VI KIT
PACK | 0 refills | Status: DC
  Filled 2020-11-23: qty 4, 8d supply, fill #0

## 2020-11-24 ENCOUNTER — Other Ambulatory Visit: Payer: Self-pay | Admitting: Pharmacist

## 2020-11-24 ENCOUNTER — Other Ambulatory Visit: Payer: Self-pay | Admitting: Physician Assistant

## 2020-11-24 ENCOUNTER — Other Ambulatory Visit (HOSPITAL_COMMUNITY): Payer: Self-pay

## 2020-11-24 MED ORDER — ENBREL 50 MG/ML ~~LOC~~ SOSY
PREFILLED_SYRINGE | SUBCUTANEOUS | 2 refills | Status: DC
  Filled 2020-11-24: qty 4, 28d supply, fill #0
  Filled 2020-12-15: qty 4, 28d supply, fill #1
  Filled 2021-01-18: qty 4, 28d supply, fill #2

## 2020-11-24 MED ORDER — ENBREL 50 MG/ML ~~LOC~~ SOSY
PREFILLED_SYRINGE | SUBCUTANEOUS | 2 refills | Status: DC
  Filled 2020-11-24: qty 4, fill #0

## 2020-11-24 NOTE — Telephone Encounter (Signed)
Next Visit: 02/17/2021  Last Visit: 09/16/2020  Last Fill: 08/20/2020  [id number] arthritis  Current Dose per office note 09/16/2020: Enbrel 50 mg sq injections every 7 days  Labs: 09/16/2020, CMP is normal, CBC shows low platelets but is stable.  TB Gold: 02/13/2020, negative   Okay to refill Enbrel?

## 2020-11-25 ENCOUNTER — Other Ambulatory Visit (HOSPITAL_COMMUNITY): Payer: Self-pay

## 2020-12-07 ENCOUNTER — Other Ambulatory Visit: Payer: Self-pay

## 2020-12-07 ENCOUNTER — Encounter: Payer: Self-pay | Admitting: Podiatry

## 2020-12-07 ENCOUNTER — Ambulatory Visit (INDEPENDENT_AMBULATORY_CARE_PROVIDER_SITE_OTHER): Payer: 59 | Admitting: Podiatry

## 2020-12-07 DIAGNOSIS — M2041 Other hammer toe(s) (acquired), right foot: Secondary | ICD-10-CM

## 2020-12-07 DIAGNOSIS — Z79899 Other long term (current) drug therapy: Secondary | ICD-10-CM

## 2020-12-07 DIAGNOSIS — M2042 Other hammer toe(s) (acquired), left foot: Secondary | ICD-10-CM

## 2020-12-07 DIAGNOSIS — L84 Corns and callosities: Secondary | ICD-10-CM | POA: Diagnosis not present

## 2020-12-07 NOTE — Progress Notes (Addendum)
This patient presents the office with chief complaint of painful callus on his right foot.  Patient states that there is a painful callus on the tip of the third and fifth toes right foot.  He says these are painful walking and wearing his shoes.  He also has a callus on his big toe right foot.  He presents the office today for an evaluation and treatment of these painful callus.  Patient is presently taking Eliquis.  Vascular  Dorsalis pedis and posterior tibial pulses are palpable  B/L.[name] Capillary return  WNL.  Temperature gradient is  WNL.  Skin turgor  WNL  Sensorium  Senn Weinstein monofilament wire  WNL. Normal tactile sensation.  Nail Exam  Patient has normal nails with no evidence of bacterial or fungal infection with the exception right hallux which is thick and deformed.  Orthopedic  Exam  Muscle tone and muscle strength  WNL.  No limitations of motion feet  B/L.  No crepitus or joint effusion noted.  Retrocalcaneal exostosis right foot.  Severely rigid hammer toes 2-5  B/L  Skin  No open lesions.  Normal skin texture and turgor. Callus distal aspect 3rd toe right.    Pinch callus hallux  Right.  Skin lesion pinch callus left hallux asymptomatic.    Callus secondary to hammer toes  B/L.     Debride callus with # 15 blade followed by dremel use.  Padding applied to shoe right.  Discussed hammer toe surgery with this patient.  RTC 3 months    Gardiner Barefoot DPM

## 2020-12-09 ENCOUNTER — Encounter: Payer: Self-pay | Admitting: Family Medicine

## 2020-12-09 ENCOUNTER — Other Ambulatory Visit: Payer: Self-pay

## 2020-12-09 ENCOUNTER — Other Ambulatory Visit (HOSPITAL_BASED_OUTPATIENT_CLINIC_OR_DEPARTMENT_OTHER): Payer: Self-pay

## 2020-12-09 ENCOUNTER — Ambulatory Visit: Payer: 59 | Admitting: Family Medicine

## 2020-12-09 VITALS — BP 128/90 | HR 70 | Temp 97.8°F | Ht 69.0 in | Wt 238.5 lb

## 2020-12-09 DIAGNOSIS — L0100 Impetigo, unspecified: Secondary | ICD-10-CM

## 2020-12-09 MED ORDER — MUPIROCIN 2 % EX OINT
1.0000 "application " | TOPICAL_OINTMENT | Freq: Three times a day (TID) | CUTANEOUS | 0 refills | Status: DC
  Filled 2020-12-09: qty 22, 7d supply, fill #0

## 2020-12-09 NOTE — Progress Notes (Signed)
[name] DOB: February 12, 1966 Encounter date: 12/09/2020  This is a 55 y.o. male who presents with Chief Complaint  Patient presents with   Otalgia    Patient complains of pain and "crustiness"noted in the right ear x2 weeks    History of present illness: Very slight pain; has been getting moist, crusty. First thought was ear wax. Very slight throbbing other day. Dried on outside of ear.   Left ear is fine.   Mild allergy issues overall; notes more in the morning.   Not had issues with right ear before.  Allergies  Allergen Reactions   Gold-Containing Drug Products Rash    Denies oral or airway involvement - occurred in the 1980s.    Current Meds  Medication Sig   acetaminophen (TYLENOL) 500 MG tablet Take 1,000 mg by mouth every 8 (eight) hours as needed for mild pain or moderate pain.    apixaban (ELIQUIS) 5 MG TABS tablet TAKE 1 TABLET BY MOUTH 2 TIMES DAILY (Patient taking differently: Take by mouth daily.)   cetirizine (ZYRTEC) 10 MG tablet Take 1 tablet (10 mg total) by mouth daily.   cholecalciferol (VITAMIN D) 1000 units tablet Take 2,000 Units by mouth daily.    COVID-19 At Home Antigen Test KIT Use as directed.   etanercept (ENBREL) 50 MG/ML injection INJECT 1 SYRINGE INTO THE SKIN ONCE A WEEK.   Magnesium Oxide (MAG-200) 200 MG TABS Take 200 mg by mouth daily.   Melatonin 5 MG CAPS Take 5 mg by mouth at bedtime as needed (sleep.).    mupirocin ointment (BACTROBAN) 2 % Apply 1 application topically 3 (three) times daily.   Omega-3 Fatty Acids (FISH OIL ULTRA) 1400 MG CAPS Take 1,400 mg by mouth daily.    Review of Systems  Objective:  BP 128/90 (BP Location: Left Arm, Patient Position: Sitting, Cuff Size: Large)   Pulse 70   Temp 97.8 F (36.6 C) (Oral)   Ht _0  (1.753 m)   Wt 238 lb 8 oz (108.2 kg)   BMI 35.22 kg/m   Weight: 238 lb 8 oz (108.2 kg)   BP Readings from Last 3 Encounters:  12/09/20 128/90  09/16/20 (!) 163/91  08/25/20 (!) 149/89   Wt  Readings from Last 3 Encounters:  12/09/20 238 lb 8 oz (108.2 kg)  09/16/20 230 lb (104.3 kg)  08/25/20 241 lb 14.4 oz (109.7 kg)    Physical Exam Constitutional:      General: He is not in acute distress.    Appearance: He is well-developed.  HENT:     Right Ear: Tympanic membrane and ear canal normal.     Left Ear: Tympanic membrane, ear canal and external ear normal.     Ears:     Comments: There is erythema just above tragus, slight edema, crusting. No significant pain.  Cardiovascular:     Heart sounds: Normal heart sounds. No murmur heard.   No friction rub.  Pulmonary:     Effort: Pulmonary effort is normal. No respiratory distress.     Breath sounds: No wheezing or rales.  Musculoskeletal:     Right lower leg: No edema.     Left lower leg: No edema.  Neurological:     Mental Status: He is alert and oriented to person, place, and time.  Psychiatric:        Behavior: Behavior normal.     Assessment/Plan 1. Impetigo Of ear. Treat with topical bactroban. Let me know if not resolved with  this. Keep skin dry before applying. Let me know if any worsening.  Return if symptoms worsen or fail to improve.     Micheline Rough, MD

## 2020-12-09 NOTE — Patient Instructions (Signed)
*  keep skin clean and dry. When dry, apply the antibiotic ointment to skin. Apply ointment 2-3 times daily. Let me know if any worsening of discomfort, drainage, skin appearance. If not resolved in 2 weeks time come back and see me.

## 2020-12-11 ENCOUNTER — Other Ambulatory Visit: Payer: Self-pay | Admitting: *Deleted

## 2020-12-11 DIAGNOSIS — Z9225 Personal history of immunosupression therapy: Secondary | ICD-10-CM

## 2020-12-11 DIAGNOSIS — Z79899 Other long term (current) drug therapy: Secondary | ICD-10-CM | POA: Diagnosis not present

## 2020-12-11 DIAGNOSIS — Z111 Encounter for screening for respiratory tuberculosis: Secondary | ICD-10-CM

## 2020-12-12 LAB — COMPLETE METABOLIC PANEL WITH GFR
AG Ratio: 1.6 (calc) (ref 1.0–2.5)
ALT: 31 U/L (ref 9–46)
AST: 23 U/L (ref 10–35)
Albumin: 4.5 g/dL (ref 3.6–5.1)
Alkaline phosphatase (APISO): 62 U/L (ref 35–144)
BUN: 23 mg/dL (ref 7–25)
CO2: 29 mmol/L (ref 20–32)
Calcium: 9.7 mg/dL (ref 8.6–10.3)
Chloride: 105 mmol/L (ref 98–110)
Creat: 1.04 mg/dL (ref 0.70–1.30)
Globulin: 2.8 g/dL (calc) (ref 1.9–3.7)
Glucose, Bld: 74 mg/dL (ref 65–99)
Potassium: 5.1 mmol/L (ref 3.5–5.3)
Sodium: 140 mmol/L (ref 135–146)
Total Bilirubin: 0.9 mg/dL (ref 0.2–1.2)
Total Protein: 7.3 g/dL (ref 6.1–8.1)
eGFR: 84 mL/min/{1.73_m2} (ref >=60)

## 2020-12-12 LAB — CBC WITH DIFFERENTIAL/PLATELET
Absolute Monocytes: 250 cells/uL (ref 200–950)
Basophils Absolute: 20 cells/uL (ref 0–200)
Basophils Relative: 0.4 %
Eosinophils Absolute: 50 cells/uL (ref 15–500)
Eosinophils Relative: 1 %
HCT: 49.1 % (ref 38.5–50.0)
Hemoglobin: 16.4 g/dL (ref 13.2–17.1)
Lymphs Abs: 2125 cells/uL (ref 850–3900)
MCH: 31.8 pg (ref 27.0–33.0)
MCHC: 33.4 g/dL (ref 32.0–36.0)
MCV: 95.3 fL (ref 80.0–100.0)
MPV: 10.8 fL (ref 7.5–12.5)
Monocytes Relative: 5 %
Neutro Abs: 2555 cells/uL (ref 1500–7800)
Neutrophils Relative %: 51.1 %
Platelets: 139 10*3/uL — ABNORMAL LOW (ref 140–400)
RBC: 5.15 10*6/uL (ref 4.20–5.80)
RDW: 12.7 % (ref 11.0–15.0)
Total Lymphocyte: 42.5 %
WBC: 5 10*3/uL (ref 3.8–10.8)

## 2020-12-13 NOTE — Progress Notes (Signed)
CMP is normal, platelets are low and stable.

## 2020-12-15 ENCOUNTER — Other Ambulatory Visit (HOSPITAL_COMMUNITY): Payer: Self-pay

## 2020-12-17 ENCOUNTER — Other Ambulatory Visit (HOSPITAL_COMMUNITY): Payer: Self-pay

## 2020-12-23 ENCOUNTER — Encounter: Payer: Self-pay | Admitting: Oncology

## 2020-12-28 ENCOUNTER — Telehealth: Payer: Self-pay

## 2020-12-28 NOTE — Telephone Encounter (Signed)
Ok to put in a same day slot this week. If there has not been any improvement at all with topical treatment he was given; I would consider ENT referral. If some or partial improvement we could try oral antibiotic with follow up next week.

## 2020-12-28 NOTE — Telephone Encounter (Signed)
Pateint called stating the medication prescribed at last visit is not working and pt wants to schedule an appt no open slots are available.  I informed pt I would send message to PCP.

## 2020-12-28 NOTE — Telephone Encounter (Signed)
Spoke with the patient and informed him of the message below. Patient stated he has not had any improvement and he is aware an appt will be scheduled on 8/31 at 10:30am.  Message sent to [facility] to add the appt.

## 2020-12-29 ENCOUNTER — Other Ambulatory Visit: Payer: Self-pay

## 2020-12-30 ENCOUNTER — Other Ambulatory Visit (HOSPITAL_BASED_OUTPATIENT_CLINIC_OR_DEPARTMENT_OTHER): Payer: Self-pay

## 2020-12-30 ENCOUNTER — Encounter: Payer: Self-pay | Admitting: Family Medicine

## 2020-12-30 ENCOUNTER — Ambulatory Visit: Payer: 59 | Admitting: Family Medicine

## 2020-12-30 VITALS — BP 112/72 | HR 59 | Temp 97.6°F | Ht 69.0 in

## 2020-12-30 DIAGNOSIS — H6091 Unspecified otitis externa, right ear: Secondary | ICD-10-CM

## 2020-12-30 MED ORDER — NEOMYCIN-POLYMYXIN-HC 3.5-10000-1 OT SUSP
3.0000 [drp] | Freq: Four times a day (QID) | OTIC | 0 refills | Status: DC
  Filled 2020-12-30: qty 10, 17d supply, fill #0

## 2020-12-30 MED ORDER — SULFAMETHOXAZOLE-TRIMETHOPRIM 800-160 MG PO TABS
2.0000 | ORAL_TABLET | Freq: Two times a day (BID) | ORAL | 0 refills | Status: AC
  Filled 2020-12-30: qty 20, 5d supply, fill #0

## 2020-12-30 NOTE — Patient Instructions (Addendum)
I would ask for Dr. Jacqualyn Posey or [facility] to discuss potential foot surgery.   Use bactroban ointment just at night in the "crack" of the ear to keep moisturized.   Use drops as directed x 7 days or at least 48 hours after ear clears up.   Take bactrim with food as directed.   I expect ear to be clear after this; if not let me know.

## 2020-12-30 NOTE — Progress Notes (Signed)
[name] DOB: 02-11-1966 Encounter date: 12/30/2020  This is a 55 y.o. male who presents with Chief Complaint  Patient presents with   Ear Pain    Patient complains of recurrent right ear pain    History of present illness:  Thinks that ear is better, just didn't go away. Still crusty, slightly although states not bad today.  Not painful.  No difficulty with hearing.  No other sick symptoms.  No fevers, chills. Allergies  Allergen Reactions   Gold-Containing Drug Products Rash    Denies oral or airway involvement - occurred in the 1980s.    Current Meds  Medication Sig   acetaminophen (TYLENOL) 500 MG tablet Take 1,000 mg by mouth every 8 (eight) hours as needed for mild pain or moderate pain.    cetirizine (ZYRTEC) 10 MG tablet Take 1 tablet (10 mg total) by mouth daily.   cholecalciferol (VITAMIN D) 1000 units tablet Take 2,000 Units by mouth daily.    COVID-19 At Home Antigen Test KIT Use as directed.   etanercept (ENBREL) 50 MG/ML injection INJECT 1 SYRINGE INTO THE SKIN ONCE A WEEK.   Magnesium Oxide (MAG-200) 200 MG TABS Take 200 mg by mouth daily.   Melatonin 5 MG CAPS Take 5 mg by mouth at bedtime as needed (sleep.).    mupirocin ointment (BACTROBAN) 2 % Apply 1 application topically 3 (three) times daily.   neomycin-polymyxin-hydrocortisone (CORTISPORIN) 3.5-10000-1 OTIC suspension Place 3 drops into the right ear 4 (four) times daily.   Omega-3 Fatty Acids (FISH OIL ULTRA) 1400 MG CAPS Take 1,400 mg by mouth daily.   sulfamethoxazole-trimethoprim (BACTRIM DS) 800-160 MG tablet Take 2 tablets by mouth 2 (two) times daily for 5 days.    Review of Systems  Constitutional:  Negative for chills, fatigue and fever.  HENT:  Negative for ear pain (see hpi).   Respiratory:  Negative for cough, chest tightness, shortness of breath and wheezing.   Cardiovascular:  Negative for chest pain, palpitations and leg swelling.   Objective:  BP 112/72 (BP Location: Left Arm,  Patient Position: Sitting, Cuff Size: Large)   Pulse (!) 59   Temp 97.6 F (36.4 C) (Oral)   Ht 5' 9"  (1.753 m)   BMI 35.22 kg/m       BP Readings from Last 3 Encounters:  12/30/20 112/72  12/09/20 128/90  09/16/20 (!) 163/91   Wt Readings from Last 3 Encounters:  12/09/20 238 lb 8 oz (108.2 kg)  09/16/20 230 lb (104.3 kg)  08/25/20 241 lb 14.4 oz (109.7 kg)    Physical Exam Constitutional:      Appearance: Normal appearance.  HENT:     Right Ear: Tympanic membrane normal.     Left Ear: Tympanic membrane and ear canal normal.     Ears:     Comments: There is some mild edema of the external ear canal on the right.  Continues to be some dry skin and crusting.  Mild erythema noted on most exterior portion of ear canal.  Area previously seen with concern for lesion looks better.  See picture. Neurological:     Mental Status: He is alert.       Assessment/Plan 1. Otitis externa of right ear, unspecified chronicity, unspecified type Overall improvement from previous exam. Due to the edema and slight erythema ear canal; we are going to cover with oral antibiotics and try an ear drop to help with inflammation, scaling. I have advised continued use of bactroban over scaling  external ear at bedtime to keep moisturized. Let me know if not resolved by next week.   Return if symptoms worsen or fail to improve.       Micheline Rough, MD

## 2021-01-05 ENCOUNTER — Encounter: Payer: Self-pay | Admitting: Family Medicine

## 2021-01-06 ENCOUNTER — Other Ambulatory Visit (HOSPITAL_BASED_OUTPATIENT_CLINIC_OR_DEPARTMENT_OTHER): Payer: Self-pay

## 2021-01-06 ENCOUNTER — Other Ambulatory Visit: Payer: Self-pay

## 2021-01-06 ENCOUNTER — Ambulatory Visit: Payer: 59 | Admitting: Family Medicine

## 2021-01-06 ENCOUNTER — Encounter: Payer: Self-pay | Admitting: Family Medicine

## 2021-01-06 VITALS — BP 128/82 | HR 66 | Temp 97.7°F | Ht 69.0 in

## 2021-01-06 DIAGNOSIS — Z23 Encounter for immunization: Secondary | ICD-10-CM

## 2021-01-06 DIAGNOSIS — G250 Essential tremor: Secondary | ICD-10-CM

## 2021-01-06 DIAGNOSIS — L089 Local infection of the skin and subcutaneous tissue, unspecified: Secondary | ICD-10-CM

## 2021-01-06 MED ORDER — GLUTATHIONE POWD: 500.0000 g | Freq: Every day | 0 refills | Status: DC

## 2021-01-06 MED ORDER — SULFAMETHOXAZOLE-TRIMETHOPRIM 800-160 MG PO TABS
2.0000 | ORAL_TABLET | Freq: Two times a day (BID) | ORAL | 0 refills | Status: AC
  Filled 2021-01-06: qty 12, 3d supply, fill #0

## 2021-01-06 NOTE — Progress Notes (Signed)
[name] DOB: 05-Oct-1965 Encounter date: 01/06/2021  This is a 55 y.o. male who presents with Chief Complaint  Patient presents with   Rash    History of present illness: Looked down in shower yesterday and had some spots down on his foot. Not itchy or sore. Not sure cause. Look about the same today compared to yesterday.   Ear is feeling better. He has completed antibiotic course.   Wonders if immune system just run down; on enbrel. Wife concerned that he may need some help with this.   Has noticed that he is shaking more recently. Shakes all the time. Doesn't want to give up coffee - does 8 oz in morning and evening; this doesn't make it worse.    Allergies  Allergen Reactions   Gold-Containing Drug Products Rash    Denies oral or airway involvement - occurred in the 1980s.    Current Meds  Medication Sig   acetaminophen (TYLENOL) 500 MG tablet Take 1,000 mg by mouth every 8 (eight) hours as needed for mild pain or moderate pain.    cetirizine (ZYRTEC) 10 MG tablet Take 1 tablet (10 mg total) by mouth daily.   cholecalciferol (VITAMIN D) 1000 units tablet Take 2,000 Units by mouth daily.    COVID-19 At Home Antigen Test KIT Use as directed.   etanercept (ENBREL) 50 MG/ML injection INJECT 1 SYRINGE INTO THE SKIN ONCE A WEEK.   Magnesium Oxide (MAG-200) 200 MG TABS Take 200 mg by mouth daily.   Melatonin 5 MG CAPS Take 5 mg by mouth at bedtime as needed (sleep.).    mupirocin ointment (BACTROBAN) 2 % Apply 1 application topically 3 (three) times daily.   neomycin-polymyxin-hydrocortisone (CORTISPORIN) 3.5-10000-1 OTIC suspension Place 3 drops into the right ear 4 (four) times daily.   Omega-3 Fatty Acids (FISH OIL ULTRA) 1400 MG CAPS Take 1,400 mg by mouth daily.    Review of Systems  Objective:  BP 128/82 (BP Location: Left Arm, Patient Position: Sitting, Cuff Size: Large)   Pulse 66   Temp 97.7 F (36.5 C) (Oral)   Ht 5' 9"  (1.753 m)   BMI 35.22 kg/m       BP  Readings from Last 3 Encounters:  01/06/21 128/82  12/30/20 112/72  12/09/20 128/90   Wt Readings from Last 3 Encounters:  12/09/20 238 lb 8 oz (108.2 kg)  09/16/20 230 lb (104.3 kg)  08/25/20 241 lb 14.4 oz (109.7 kg)    Physical Exam Constitutional:      General: He is not in acute distress.    Appearance: He is well-developed.  Cardiovascular:     Rate and Rhythm: Normal rate and regular rhythm.     Heart sounds: Normal heart sounds. No murmur heard.   No friction rub.  Pulmonary:     Effort: Pulmonary effort is normal. No respiratory distress.     Breath sounds: Normal breath sounds. No wheezing or rales.  Musculoskeletal:     Right lower leg: No edema.     Left lower leg: No edema.  Neurological:     Mental Status: He is alert and oriented to person, place, and time.  Psychiatric:        Behavior: Behavior normal.       Assessment/Plan  1. Skin infection We will cover for 3 days with bactrim. He has already had some improvement.  Warm compresses a couple of times daily to top of foot. Let me know if any worsening.  2. Essential tremor Consider propranolol, follow back up with neurology versus integrative medicine. Worries about hx of cobalt from hip implant; may benefit from more thorough/formal eval.    Return if symptoms worsen or fail to improve. 39 minutes spent with patient in discussion of current skin condition, treatment options for tremor, follow up, charting, exam.    Micheline Rough, MD

## 2021-01-06 NOTE — Addendum Note (Signed)
Addended by: Agnes Lawrence on: 01/06/2021 09:45 AM   Modules accepted: Orders

## 2021-01-12 ENCOUNTER — Other Ambulatory Visit (HOSPITAL_COMMUNITY): Payer: Self-pay

## 2021-01-18 ENCOUNTER — Other Ambulatory Visit (HOSPITAL_COMMUNITY): Payer: Self-pay

## 2021-02-03 NOTE — Progress Notes (Addendum)
Office Visit Note  Patient: [name]             Date of Birth: 17-Dec-1965           MRN: [id number]             PCP: Caren Macadam, MD Referring: Caren Macadam, MD Visit Date: 02/17/2021 Occupation: @GUAROCC @  Subjective:  Medication monitoring   History of Present Illness: STACE PEACE is a 55 y.o. male with history of psoriatic arthritis, osteoarthritis, and DDD. He is on enbrel 50 mg sq injections once weekly.  He has not missed any doses of Enbrel recently.  He denies any signs or symptoms of a psoriatic arthritis flare recently.  He states that he has been exercising 5 to 6 days a week at [facility] which has been improving his flexibility and strength.  He is excited he misses a day at the gym he feels increased aching and stiffness.  He has some difficulty gripping some of the weight machines due to the stiffness in his hands.  He denies any increased pain or swelling in his hands recently.  He states that he has hammertoes in both feet.  He denies any Achilles tendinitis or plantar fasciitis.  He denies any other joint pain or joint swelling at this time.  He denies any psoriasis.  He has been getting yearly skin examinations by his dermatologist.     Activities of Daily Living:  Patient reports morning stiffness for 1 hour.   Patient Denies nocturnal pain.  Difficulty dressing/grooming: Reports Difficulty climbing stairs: Denies Difficulty getting out of chair: Denies Difficulty using hands for taps, buttons, cutlery, and/or writing: Denies  Review of Systems  Constitutional:  Positive for fatigue.  HENT:  Negative for mouth sores, mouth dryness and nose dryness.   Eyes:  Negative for pain, itching and dryness.  Respiratory:  Negative for shortness of breath and difficulty breathing.   Cardiovascular:  Negative for chest pain and palpitations.  Gastrointestinal:  Negative for blood in stool, constipation and diarrhea.  Endocrine: Negative for increased  urination.  Genitourinary:  Negative for difficulty urinating.  Musculoskeletal:  Positive for morning stiffness. Negative for joint pain, joint pain, joint swelling, myalgias, muscle tenderness and myalgias.  Skin:  Negative for color change, rash and redness.  Allergic/Immunologic: Positive for susceptible to infections.  Neurological:  Positive for numbness. Negative for dizziness, headaches, memory loss and weakness.  Hematological:  Negative for bruising/bleeding tendency.  Psychiatric/Behavioral:  Negative for confusion.    PMFS History:  Patient Active Problem List   Diagnosis Date Noted   Corns and callosities 01/15/2020   Hammer toes, bilateral 01/15/2020   Orthopnea 11/01/2019   History of COVID-19 07/30/2019   Acute deep vein thrombosis (DVT) of left lower extremity ([place]) 07/30/2019   Abnormal CT of the chest 07/30/2019   Cough 07/30/2019   Leg DVT (deep venous thromboembolism), acute, bilateral ([place]) 07/24/2019   HTN (hypertension) 07/24/2019   COVID-19 virus infection 07/20/2019   Acute respiratory failure with hypoxia ([place]) 07/20/2019   Acute pulmonary embolism ([place]) 07/19/2019   Essential tremor 04/03/2017   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/23/2016   High risk medications (not anticoagulants) long-term use 09/20/2016   Bilateral hand pain 09/20/2016   DDD (degenerative disc disease), cervical 09/20/2016   History of total hip replacement, bilateral 09/20/2016   DDD (degenerative disc disease), lumbar 09/20/2016   Bilateral plantar fasciitis 09/20/2016   Psoriasis 09/20/2016   Primary osteoarthritis of  both feet 09/20/2016   Primary osteoarthritis of both hands 09/20/2016   Primary osteoarthritis of both knees 09/20/2016   Prediabetes 09/22/2014   Hyperlipidemia 09/22/2014   URI (upper respiratory infection) 07/07/2012   Acute bronchitis 07/07/2012   OBESITY 07/03/2008   AVASCULAR NECROSIS, FEMORAL HEAD 07/03/2008   Psoriatic arthritis ([place]) 05/02/2001     Past Medical History:  Diagnosis Date   Anemia    History of avascular necrosis of capital femoral epiphysis 2006, 2007   Bilateral Femoral Head   HLD (hyperlipidemia)    Pneumonia    twice   Psoriatic arthritis ([place])     Family History  Problem Relation Age of Onset   Pneumonia Father    Tremor Neg Hx    Past Surgical History:  Procedure Laterality Date   JOINT REPLACEMENT Bilateral 2006, 2007   Necrosis femoral head (bilateral)   TOTAL HIP REVISION Right 11/23/2016   Procedure: Acetabulum liner and femoral head revision;  Surgeon: [name], MD;  Location: [facility];  Service: Orthopedics;  Laterality: Right;   TOTAL HIP REVISION Left 04/10/2019   Procedure: Left hip bearing surface vs total hip arthroplasty revision;  Surgeon: [name], MD;  Location: [facility];  Service: Orthopedics;  Laterality: Left;  159min   Social History   Social History Narrative   Lives at home w/ his wife   Right-handed   Caffeine: 2-3 cups per day   Immunization History  Administered Date(s) Administered   Influenza Inj Mdck Quad Pf 02/01/2019   Influenza,inj,Quad PF,6+ Mos 02/03/2017, 03/21/2018, 01/06/2021   Influenza,inj,quad, With Preservative 02/01/2019   Influenza-Unspecified 02/03/2017, 03/21/2018, 02/01/2019   PFIZER(Purple Top)SARS-COV-2 Vaccination 10/06/2019, 10/27/2019   Pneumococcal Conjugate-13 03/21/2018   Tdap 09/22/2014   Zoster Recombinat (Shingrix) 04/02/2018, 05/23/2018   Zoster, Live 04/02/2018, 05/23/2018     Objective: Vital Signs: BP (!) 152/89 (BP Location: Left Arm, Patient Position: Sitting, Cuff Size: Large)   Pulse 64   Ht 5\' 9"  (1.753 m)   Wt 228 lb (103.4 kg)   BMI 33.67 kg/m    Physical Exam Vitals and nursing note reviewed.  Constitutional:      Appearance: He is well-developed.  HENT:     Head: Normocephalic and atraumatic.  Eyes:     Conjunctiva/sclera: Conjunctivae normal.     Pupils: Pupils are equal, round, and reactive to light.   Pulmonary:     Effort: Pulmonary effort is normal.  Abdominal:     Palpations: Abdomen is soft.  Musculoskeletal:     Cervical back: Normal range of motion and neck supple.  Skin:    General: Skin is warm and dry.     Capillary Refill: Capillary refill takes less than 2 seconds.  Neurological:     Mental Status: He is alert and oriented to person, place, and time.  Psychiatric:        Behavior: Behavior normal.     Musculoskeletal Exam: C-spine limited ROM with lateral rotation, especially to the left.  Postural thoracic kyphosis noted.  Shoulder joints have good ROM with some stiffness in the left shoulder.  Bilateral elbow joint contractures. Limited ROM of both wrist joints.  PIP and DIP thickening consistent with osteoarthritis of both hands.  Limited extension of PIP joints.  Incomplete fist formation bilaterally.  Hip joints difficult to assess in seated position.  Knee joints have good ROM with no warmth or effusion.  Ankle joints have good ROM with no tenderness or joint swelling.  Hammertoes noted.  CDAI Exam: CDAI Score: -- Patient Global: --; Provider Global: -- Swollen: --; Tender: -- Joint Exam 02/17/2021   No joint exam has been documented for this visit   There is currently no information documented on the homunculus. Go to the Rheumatology activity and complete the homunculus joint exam.  Investigation: No additional findings.  Imaging: No results found.  Recent Labs: Lab Results  Component Value Date   WBC 5.0 12/11/2020   HGB 16.4 12/11/2020   PLT 139 (L) 12/11/2020   NA 140 12/11/2020   K 5.1 12/11/2020   CL 105 12/11/2020   CO2 29 12/11/2020   GLUCOSE 74 12/11/2020   BUN 23 12/11/2020   CREATININE 1.04 12/11/2020   BILITOT 0.9 12/11/2020   ALKPHOS 97 07/30/2019   AST 23 12/11/2020   ALT 31 12/11/2020   PROT 7.3 12/11/2020   ALBUMIN 4.6 07/30/2019   CALCIUM 9.7 12/11/2020   GFRAA 94 09/16/2020   QFTBGOLDPLUS NEGATIVE 02/13/2020     Speciality Comments: No specialty comments available.  Procedures:  No procedures performed Allergies: Gold-containing drug products   Assessment / Plan:     Visit Diagnoses: Psoriatic arthritis ([place]): He has no synovitis or dactylitis on exam.  He has not had any signs or symptoms of a psoriatic arthritis flare recently.  He is clinically doing well on Enbrel 50 mg subcutaneous injections once weekly.  He has not missed any doses of Enbrel recently.  He has started to exercise at [facility] 5 to 6 days/week and has noticed improvement in his flexibility and muscle strengthening.  He continues to experience stiffness in both hands and has incomplete fist formation bilaterally.  Discussed the importance of joint protection and muscle strengthening.  He was given a handout of hand exercises to perform.  He will remain on Enbrel 50 mg sq injections once weekly.  He was advised to notify us if he develops increased joint pain or joint swelling.  He will follow-up in the office in 5 months.  Psoriasis: He has no active psoriasis at this time.  High risk medication use - Enbrel 50 mg sq injections every 7 days. CBC and CMP updated on 12/11/20.  CBC and CMP will be drawn today.  His next lab work will be due in January and every 3 months to monitor for drug toxicity.  Standing orders for CBC and CMP remain in place.  TB gold negative on 02/13/20.  He is due to update TB gold today.  Order released. - Plan: CBC with Differential/Platelet, COMPLETE METABOLIC PANEL WITH GFR Discussed the importance of yearly skin examinations with his dermatologist.  He had a recent ear infection which has resolved.  Discussed the importance of holding Enbrel if he develops signs or symptoms of an infection and to resume once the infection is completely cleared.  He voiced understanding.   Screening for tuberculosis - Order for TB gold released today. Plan: QuantiFERON-TB Gold Plus  Primary osteoarthritis of both hands: He  has PIP and DIP thickening consistent with osteoarthritis of both hands.  Incomplete fist formation bilaterally.  Limited range of motion of both wrist joints but no tenderness or synovitis was noted.  He experiences some difficulty gripping some of the weight machines at the gym.  Discussed the importance of joint protection and muscle strengthening.  He was given a handout of hand exercises to perform.  History of total hip replacement, bilateral: Doing well.  Difficult to assess range of motion in seated position.  Primary osteoarthritis  of both knees: He has good range of motion of both knee joints with no warmth or effusion.  He has been performing lower extremity muscle strengthening exercises 5 to 6 days/week as well as using the elliptical for exercise.  Primary osteoarthritis of both feet: Followed by Dr. Prudence Davidson at [facility].  Hammertoes noted bilaterally.  He is not ready to proceed with surgery at this time.  Discussed the importance of wearing proper fitting shoes with support as well as a soft toe box.  DDD (degenerative disc disease), cervical: He has limited range of motion with lateral rotation especially to the left.  He has been performing stretching exercises daily.  DDD (degenerative disc disease), lumbar: He has chronic pain and stiffness in his lower back.  He performs stretching exercises daily.  Other fatigue: His energy level has improved since he has started to go to the gym 5 to 6 days/week.  Other medical conditions are listed as follows:   History of prediabetes  History of pulmonary embolism - After COVID-19 infection March 2021. He is no longer taking eliquis.   History of hyperlipidemia  History of hypertension  History of vitamin D deficiency: She is taking vitamin D 2000 units daily.   History of COVID-19 - March 2021    Orders: Orders Placed This Encounter  Procedures   CBC with Differential/Platelet   COMPLETE METABOLIC PANEL WITH GFR    QuantiFERON-TB Gold Plus    No orders of the defined types were placed in this encounter.     Follow-Up Instructions: Return in about 5 months (around 07/18/2021) for Psoriatic arthritis, Osteoarthritis, DDD.   Ofilia Neas, PA-C  Note - This record has been created using Dragon software.  Chart creation errors have been sought, but may not always  have been located. Such creation errors do not reflect on  the standard of medical care.

## 2021-02-10 ENCOUNTER — Other Ambulatory Visit (HOSPITAL_COMMUNITY): Payer: Self-pay

## 2021-02-10 ENCOUNTER — Other Ambulatory Visit: Payer: Self-pay | Admitting: Physician Assistant

## 2021-02-10 MED ORDER — ENBREL 50 MG/ML ~~LOC~~ SOSY
PREFILLED_SYRINGE | SUBCUTANEOUS | 2 refills | Status: DC
  Filled 2021-02-10: qty 4, fill #0

## 2021-02-10 NOTE — Telephone Encounter (Signed)
Next Visit: 02/17/2021  Last Visit: 09/16/2020  Last Fill: 11/24/2020   EX:[id number] arthritis   Current Dose per office note 09/16/2020: Enbrel 50 mg sq injections every 7 days  Labs: 12/11/2020 CMP is normal, platelets are low and stable.  TB Gold: 02/13/2020 Neg    Update TB Gold at upcoming appointment.   Okay to refill Enbrel?

## 2021-02-11 ENCOUNTER — Other Ambulatory Visit (HOSPITAL_COMMUNITY): Payer: Self-pay

## 2021-02-11 ENCOUNTER — Other Ambulatory Visit: Payer: Self-pay | Admitting: Pharmacist

## 2021-02-11 MED ORDER — ENBREL 50 MG/ML ~~LOC~~ SOSY
PREFILLED_SYRINGE | SUBCUTANEOUS | 2 refills | Status: DC
  Filled 2021-02-11: qty 4, 28d supply, fill #0
  Filled 2021-03-11: qty 4, 28d supply, fill #1
  Filled 2021-04-07 – 2021-04-15 (×2): qty 4, 28d supply, fill #2

## 2021-02-15 ENCOUNTER — Other Ambulatory Visit (HOSPITAL_COMMUNITY): Payer: Self-pay

## 2021-02-17 ENCOUNTER — Encounter: Payer: Self-pay | Admitting: Physician Assistant

## 2021-02-17 ENCOUNTER — Other Ambulatory Visit: Payer: Self-pay

## 2021-02-17 ENCOUNTER — Ambulatory Visit (INDEPENDENT_AMBULATORY_CARE_PROVIDER_SITE_OTHER): Payer: 59 | Admitting: Physician Assistant

## 2021-02-17 VITALS — BP 152/89 | HR 64 | Ht 69.0 in | Wt 228.0 lb

## 2021-02-17 DIAGNOSIS — L409 Psoriasis, unspecified: Secondary | ICD-10-CM | POA: Diagnosis not present

## 2021-02-17 DIAGNOSIS — Z96643 Presence of artificial hip joint, bilateral: Secondary | ICD-10-CM | POA: Diagnosis not present

## 2021-02-17 DIAGNOSIS — Z111 Encounter for screening for respiratory tuberculosis: Secondary | ICD-10-CM | POA: Diagnosis not present

## 2021-02-17 DIAGNOSIS — L4050 Arthropathic psoriasis, unspecified: Secondary | ICD-10-CM

## 2021-02-17 DIAGNOSIS — M5136 Other intervertebral disc degeneration, lumbar region: Secondary | ICD-10-CM | POA: Diagnosis not present

## 2021-02-17 DIAGNOSIS — Z79899 Other long term (current) drug therapy: Secondary | ICD-10-CM

## 2021-02-17 DIAGNOSIS — M19071 Primary osteoarthritis, right ankle and foot: Secondary | ICD-10-CM | POA: Diagnosis not present

## 2021-02-17 DIAGNOSIS — Z87898 Personal history of other specified conditions: Secondary | ICD-10-CM

## 2021-02-17 DIAGNOSIS — Z8639 Personal history of other endocrine, nutritional and metabolic disease: Secondary | ICD-10-CM

## 2021-02-17 DIAGNOSIS — Z8616 Personal history of COVID-19: Secondary | ICD-10-CM

## 2021-02-17 DIAGNOSIS — Z8679 Personal history of other diseases of the circulatory system: Secondary | ICD-10-CM

## 2021-02-17 DIAGNOSIS — M5030 Other cervical disc degeneration, unspecified cervical region: Secondary | ICD-10-CM

## 2021-02-17 DIAGNOSIS — M170 Bilateral primary osteoarthritis of knee: Secondary | ICD-10-CM

## 2021-02-17 DIAGNOSIS — M19042 Primary osteoarthritis, left hand: Secondary | ICD-10-CM

## 2021-02-17 DIAGNOSIS — M19041 Primary osteoarthritis, right hand: Secondary | ICD-10-CM | POA: Diagnosis not present

## 2021-02-17 DIAGNOSIS — M19072 Primary osteoarthritis, left ankle and foot: Secondary | ICD-10-CM

## 2021-02-17 DIAGNOSIS — R5383 Other fatigue: Secondary | ICD-10-CM

## 2021-02-17 DIAGNOSIS — Z86711 Personal history of pulmonary embolism: Secondary | ICD-10-CM

## 2021-02-17 NOTE — Patient Instructions (Addendum)
Standing Labs We placed an order today for your standing lab work.   Please have your standing labs drawn in January and every 3 months   If possible, please have your labs drawn 2 weeks prior to your appointment so that the provider can discuss your results at your appointment.  Please note that you may see your imaging and lab results in [facility] before we have reviewed them. We may be awaiting multiple results to interpret others before contacting you. Please allow our office up to 72 hours to thoroughly review all of the results before contacting the office for clarification of your results.  We have open lab daily: Monday through Thursday from 1:30-4:30 PM and Friday from 1:30-4:00 PM at the office of Dr. Bo Merino, [facility].   Please be advised, all patients with office appointments requiring lab work will take precedent over walk-in lab work.  If possible, please come for your lab work on Monday and Friday afternoons, as you may experience shorter wait times. The office is located at [address] [place], [place], [place] [address] No appointment is necessary.   Labs are drawn by [facility]. Please bring your co-pay at the time of your lab draw.  You may receive a bill from [facility] for your lab work.  If you wish to have your labs drawn at another location, please call the office 24 hours in advance to send orders.  If you have any questions regarding directions or hours of operation,  please call [phone].   As a reminder, please drink plenty of water prior to coming for your lab work. Thanks!   Hand Exercises Hand exercises can be helpful for almost anyone. These exercises can strengthen the hands, improve flexibility and movement, and increase blood flow to the hands. These results can make work and daily tasks easier. Hand exercises can be especially helpful for people who have joint pain from arthritis or have nerve damage from overuse (carpal tunnel  syndrome). These exercises can also help people who have injured a hand. Exercises Most of these hand exercises are gentle stretching and motion exercises. It is usually safe to do them often throughout the day. Warming up your hands before exercise may help to reduce stiffness. You can do this with gentle massage or by placing your hands in warm water for 10-15 minutes. It is normal to feel some stretching, pulling, tightness, or mild discomfort as you begin new exercises. This will gradually improve. Stop an exercise right away if you feel sudden, severe pain or your pain gets worse. Ask your health care provider which exercises are best for you. Knuckle bend or "claw" fist  Stand or sit with your arm, hand, and all five fingers pointed straight up. Make sure to keep your wrist straight during the exercise. Gently bend your fingers down toward your palm until the tips of your fingers are touching the top of your palm. Keep your big knuckle straight and just bend the small knuckles in your fingers. Hold this position for __________ seconds. Straighten (extend) your fingers back to the starting position. Repeat this exercise 5-10 times with each hand. Full finger fist  Stand or sit with your arm, hand, and all five fingers pointed straight up. Make sure to keep your wrist straight during the exercise. Gently bend your fingers into your palm until the tips of your fingers are touching the middle of your palm. Hold this position for __________ seconds. Extend your fingers back to the  starting position, stretching every joint fully. Repeat this exercise 5-10 times with each hand. Straight fist Stand or sit with your arm, hand, and all five fingers pointed straight up. Make sure to keep your wrist straight during the exercise. Gently bend your fingers at the big knuckle, where your fingers meet your hand, and the middle knuckle. Keep the knuckle at the tips of your fingers straight and try to touch  the bottom of your palm. Hold this position for __________ seconds. Extend your fingers back to the starting position, stretching every joint fully. Repeat this exercise 5-10 times with each hand. Tabletop  Stand or sit with your arm, hand, and all five fingers pointed straight up. Make sure to keep your wrist straight during the exercise. Gently bend your fingers at the big knuckle, where your fingers meet your hand, as far down as you can while keeping the small knuckles in your fingers straight. Think of forming a tabletop with your fingers. Hold this position for __________ seconds. Extend your fingers back to the starting position, stretching every joint fully. Repeat this exercise 5-10 times with each hand. Finger spread  Place your hand flat on a table with your palm facing down. Make sure your wrist stays straight as you do this exercise. Spread your fingers and thumb apart from each other as far as you can until you feel a gentle stretch. Hold this position for __________ seconds. Bring your fingers and thumb tight together again. Hold this position for __________ seconds. Repeat this exercise 5-10 times with each hand. Making circles  Stand or sit with your arm, hand, and all five fingers pointed straight up. Make sure to keep your wrist straight during the exercise. Make a circle by touching the tip of your thumb to the tip of your index finger. Hold for __________ seconds. Then open your hand wide. Repeat this motion with your thumb and each finger on your hand. Repeat this exercise 5-10 times with each hand. Thumb motion  Sit with your forearm resting on a table and your wrist straight. Your thumb should be facing up toward the ceiling. Keep your fingers relaxed as you move your thumb. Lift your thumb up as high as you can toward the ceiling. Hold for __________ seconds. Bend your thumb across your palm as far as you can, reaching the tip of your thumb for the small finger  (pinkie) side of your palm. Hold for __________ seconds. Repeat this exercise 5-10 times with each hand. Grip strengthening  Hold a stress ball or other soft ball in the middle of your hand. Slowly increase the pressure, squeezing the ball as much as you can without causing pain. Think of bringing the tips of your fingers into the middle of your palm. All of your finger joints should bend when doing this exercise. Hold your squeeze for __________ seconds, then relax. Repeat this exercise 5-10 times with each hand. Contact a health care provider if: Your hand pain or discomfort gets much worse when you do an exercise. Your hand pain or discomfort does not improve within 2 hours after you exercise. If you have any of these problems, stop doing these exercises right away. Do not do them again unless your health care provider says that you can. Get help right away if: You develop sudden, severe hand pain or swelling. If this happens, stop doing these exercises right away. Do not do them again unless your health care provider says that you can. This information is  not intended to replace advice given to you by your health care provider. Make sure you discuss any questions you have with your health care provider. Document Revised: 08/06/2020 Document Reviewed: 08/06/2020 Elsevier Patient Education  [place].

## 2021-02-18 NOTE — Progress Notes (Signed)
CBC and CMP WNL

## 2021-02-21 LAB — CBC WITH DIFFERENTIAL/PLATELET
Absolute Monocytes: 240 cells/uL (ref 200–950)
Basophils Absolute: 20 cells/uL (ref 0–200)
Basophils Relative: 0.4 %
Eosinophils Absolute: 40 cells/uL (ref 15–500)
Eosinophils Relative: 0.8 %
HCT: 49.6 % (ref 38.5–50.0)
Hemoglobin: 16.7 g/dL (ref 13.2–17.1)
Lymphs Abs: 1695 cells/uL (ref 850–3900)
MCH: 31.9 pg (ref 27.0–33.0)
MCHC: 33.7 g/dL (ref 32.0–36.0)
MCV: 94.7 fL (ref 80.0–100.0)
MPV: 11 fL (ref 7.5–12.5)
Monocytes Relative: 4.8 %
Neutro Abs: 3005 cells/uL (ref 1500–7800)
Neutrophils Relative %: 60.1 %
Platelets: 140 10*3/uL (ref 140–400)
RBC: 5.24 10*6/uL (ref 4.20–5.80)
RDW: 11.9 % (ref 11.0–15.0)
Total Lymphocyte: 33.9 %
WBC: 5 10*3/uL (ref 3.8–10.8)

## 2021-02-21 LAB — COMPLETE METABOLIC PANEL WITH GFR
AG Ratio: 1.5 (calc) (ref 1.0–2.5)
ALT: 26 U/L (ref 9–46)
AST: 26 U/L (ref 10–35)
Albumin: 4.6 g/dL (ref 3.6–5.1)
Alkaline phosphatase (APISO): 67 U/L (ref 35–144)
BUN: 20 mg/dL (ref 7–25)
CO2: 27 mmol/L (ref 20–32)
Calcium: 9.9 mg/dL (ref 8.6–10.3)
Chloride: 104 mmol/L (ref 98–110)
Creat: 1.02 mg/dL (ref 0.70–1.30)
Globulin: 3 g/dL (calc) (ref 1.9–3.7)
Glucose, Bld: 83 mg/dL (ref 65–99)
Potassium: 5 mmol/L (ref 3.5–5.3)
Sodium: 141 mmol/L (ref 135–146)
Total Bilirubin: 0.7 mg/dL (ref 0.2–1.2)
Total Protein: 7.6 g/dL (ref 6.1–8.1)
eGFR: 86 mL/min/{1.73_m2} (ref >=60)

## 2021-02-21 LAB — QUANTIFERON-TB GOLD PLUS
Mitogen-NIL: 5.99 IU/mL
NIL: 0.06 IU/mL
QuantiFERON-TB Gold Plus: NEGATIVE
TB1-NIL: 0 IU/mL
TB2-NIL: <0 IU/mL

## 2021-02-22 ENCOUNTER — Telehealth: Payer: Self-pay | Admitting: *Deleted

## 2021-02-22 NOTE — Progress Notes (Signed)
TB gold negative

## 2021-02-22 NOTE — Telephone Encounter (Signed)
Patient called about upcoming appointment with Dr Alen Blew this week to evaluate the use of Eliquis with hx of DVT.  He wasn't sure if he should keep this appointment.  States he has been off of Eliquis for about 2 months without any issue.  Back in August he stopped after consulting with Dr Alen Blew.  Needs to know if he even needs this follow up appt with Dr Alen Blew.  Routed to MD to advise.

## 2021-02-23 ENCOUNTER — Other Ambulatory Visit (HOSPITAL_COMMUNITY): Payer: Self-pay

## 2021-02-24 ENCOUNTER — Ambulatory Visit: Payer: 59 | Admitting: Oncology

## 2021-02-25 ENCOUNTER — Other Ambulatory Visit (HOSPITAL_BASED_OUTPATIENT_CLINIC_OR_DEPARTMENT_OTHER): Payer: Self-pay

## 2021-02-25 MED ORDER — INSULIN PEN NEEDLE 31G X 6 MM MISC
0 refills | Status: DC
  Filled 2021-02-25: qty 2, 4d supply, fill #0

## 2021-02-25 MED ORDER — CARESTART COVID-19 HOME TEST VI KIT
PACK | 0 refills | Status: DC
  Filled 2021-02-25: qty 2, 4d supply, fill #0

## 2021-03-08 ENCOUNTER — Other Ambulatory Visit: Payer: Self-pay

## 2021-03-08 ENCOUNTER — Encounter: Payer: Self-pay | Admitting: Podiatry

## 2021-03-08 ENCOUNTER — Ambulatory Visit (INDEPENDENT_AMBULATORY_CARE_PROVIDER_SITE_OTHER): Payer: 59 | Admitting: Podiatry

## 2021-03-08 DIAGNOSIS — L84 Corns and callosities: Secondary | ICD-10-CM

## 2021-03-08 DIAGNOSIS — M2041 Other hammer toe(s) (acquired), right foot: Secondary | ICD-10-CM

## 2021-03-08 DIAGNOSIS — M2042 Other hammer toe(s) (acquired), left foot: Secondary | ICD-10-CM

## 2021-03-08 NOTE — Progress Notes (Signed)
This patient presents the office with chief complaint of painful callus on his right foot.  Patient states that there is a painful callus on the tip of the third and fifth toes right foot.  He says these are painful walking and wearing his shoes.  He also has a callus on his big toe right foot.  He presents the office today for an evaluation and treatment of these painful callus.  Patient is presently taking Eliquis.  Vascular  Dorsalis pedis and posterior tibial pulses are palpable  B/L.[name] Capillary return  WNL.  Temperature gradient is  WNL.  Skin turgor  WNL  Sensorium  Senn Weinstein monofilament wire  WNL. Normal tactile sensation.  Nail Exam  Patient has normal nails with no evidence of bacterial or fungal infection with the exception right hallux which is thick and deformed.  Orthopedic  Exam  Muscle tone and muscle strength  WNL.  No limitations of motion feet  B/L.  No crepitus or joint effusion noted.  Retrocalcaneal exostosis right foot.  Severely rigid hammer toes 2-5  B/L  Skin  No open lesions.  Normal skin texture and turgor. Callus distal aspect 3rd toe right.    Pinch callus hallux  Right.  Skin lesion pinch callus left hallux asymptomatic.    Callus secondary to hammer toes  B/L.     Debride callus with # 15 blade followed by dremel use.  Padding applied to shoe right.  Discussed hammer toe surgery with this patient.  RTC 3 months    Gardiner Barefoot DPM

## 2021-03-11 ENCOUNTER — Other Ambulatory Visit (HOSPITAL_COMMUNITY): Payer: Self-pay

## 2021-03-15 ENCOUNTER — Other Ambulatory Visit (HOSPITAL_COMMUNITY): Payer: Self-pay

## 2021-04-07 ENCOUNTER — Ambulatory Visit: Payer: 59 | Admitting: Pulmonary Disease

## 2021-04-07 ENCOUNTER — Other Ambulatory Visit (HOSPITAL_COMMUNITY): Payer: Self-pay

## 2021-04-12 ENCOUNTER — Other Ambulatory Visit (HOSPITAL_COMMUNITY): Payer: Self-pay

## 2021-04-12 ENCOUNTER — Telehealth: Payer: Self-pay

## 2021-04-12 NOTE — Telephone Encounter (Signed)
Received notification from [facility] that new PA was needed.  Submitted a Prior Authorization request to [facility] for ENBREL via CoverMyMeds. Will update once we receive a response.   Key: [id number]

## 2021-04-15 ENCOUNTER — Telehealth: Payer: Self-pay

## 2021-04-15 ENCOUNTER — Other Ambulatory Visit (HOSPITAL_COMMUNITY): Payer: Self-pay

## 2021-04-15 NOTE — Telephone Encounter (Signed)
Received notification from [facility] regarding a prior authorization for ENBREL. Authorization has been APPROVED from 04/12/2021 to 04/11/2022. New PA info added to [name] notified, and approval letter sent to scan center.  Authorization # [id number]

## 2021-04-15 NOTE — Telephone Encounter (Signed)
-  Caller states that he has sinus pressure/congestion for about a week; taking tylenol, mucinex.  04/14/2021 5:02:11 PM SEE PCP WITHIN 3 DAYS Joya Gaskins, RN, Vonna Kotyk  04/15/21 1517: Pt states symptoms have resolved since this am. Pt states that since the pressure is relieved he will decline scheduling an appt at this time. Advised that if symptoms worsen or fail to improve to call office & schedule appt with any available provider. Pt verb understanding.

## 2021-04-19 ENCOUNTER — Other Ambulatory Visit (HOSPITAL_BASED_OUTPATIENT_CLINIC_OR_DEPARTMENT_OTHER): Payer: Self-pay

## 2021-04-19 DIAGNOSIS — J0100 Acute maxillary sinusitis, unspecified: Secondary | ICD-10-CM | POA: Diagnosis not present

## 2021-04-19 DIAGNOSIS — R051 Acute cough: Secondary | ICD-10-CM | POA: Diagnosis not present

## 2021-04-19 MED ORDER — AMOXICILLIN-POT CLAVULANATE 875-125 MG PO TABS
ORAL_TABLET | ORAL | 0 refills | Status: DC
  Filled 2021-04-19: qty 14, 7d supply, fill #0

## 2021-04-19 MED ORDER — COVID-19 AT HOME ANTIGEN TEST VI KIT
PACK | 0 refills | Status: DC
  Filled 2021-04-19: qty 2, 4d supply, fill #0

## 2021-04-20 ENCOUNTER — Telehealth: Payer: 59 | Admitting: Family Medicine

## 2021-04-27 ENCOUNTER — Telehealth: Payer: 59 | Admitting: Physician Assistant

## 2021-04-27 DIAGNOSIS — J111 Influenza due to unidentified influenza virus with other respiratory manifestations: Secondary | ICD-10-CM

## 2021-04-27 DIAGNOSIS — R6889 Other general symptoms and signs: Secondary | ICD-10-CM | POA: Diagnosis not present

## 2021-04-27 MED ORDER — OSELTAMIVIR PHOSPHATE 75 MG PO CAPS
75.0000 mg | ORAL_CAPSULE | Freq: Two times a day (BID) | ORAL | 0 refills | Status: DC
Start: 2021-04-27 — End: 2022-05-18

## 2021-04-27 NOTE — Progress Notes (Signed)
E visit for Flu like symptoms °  °We are sorry that you are not feeling well.  Here is how we plan to help! °Based on what you have shared with me it looks like you may have a respiratory virus that may be influenza. ° °Influenza or “the flu” is   an infection caused by a respiratory virus. The flu virus is highly contagious and persons who did not receive their yearly flu vaccination may “catch” the flu from close contact. ° °We have anti-viral medications to treat the viruses that cause this infection. They are not a “cure” and only shorten the course of the infection. These prescriptions are most effective when they are given within the first 2 days of “flu” symptoms. Antiviral medication are indicated if you have a high risk of complications from the flu. You should  also consider an antiviral medication if you are in close contact with someone who is at risk. These medications can help patients avoid complications from the flu  but have side effects that you should know. Possible side effects from Tamiflu or oseltamivir include nausea, vomiting, diarrhea, dizziness, headaches, eye redness, sleep problems or other respiratory symptoms. °You should not take Tamiflu if you have an allergy to oseltamivir or any to the ingredients in Tamiflu. ° °Based upon your symptoms and potential risk factors I have prescribed Oseltamivir (Tamiflu).  It has been sent to your designated pharmacy.  You will take one 75 mg capsule orally twice a day for the next 5 days. ° °ANYONE WHO HAS FLU SYMPTOMS SHOULD: °Stay home. The flu is highly contagious and going out or to work exposes others! °Be sure to drink plenty of fluids. Water is fine as well as fruit juices, sodas and electrolyte beverages. You may want to stay away from caffeine or alcohol. If you are nauseated, try taking small sips of liquids. How do you know if you are getting enough fluid? Your urine should be a pale yellow or almost colorless. °Get rest. °Taking a steamy  shower or using a humidifier may help nasal congestion and ease sore throat pain. Using a saline nasal spray works much the same way. °Cough drops, hard candies and sore throat lozenges may ease your cough. °Line up a caregiver. Have someone check on you regularly. ° ° °GET HELP RIGHT AWAY IF: °You cannot keep down liquids or your medications. °You become short of breath °Your fell like you are going to pass out or loose consciousness. °Your symptoms persist after you have completed your treatment plan °MAKE SURE YOU  °Understand these instructions. °Will watch your condition. °Will get help right away if you are not doing well or get worse. ° °Your e-visit answers were reviewed by a board certified advanced clinical practitioner to complete your personal care plan.  Depending on the condition, your plan could have included both over the counter or prescription medications. ° °If there is a problem please reply  once you have received a response from your provider. ° °Your safety is important to us.  If you have drug allergies check your prescription carefully.   ° °You can use MyChart to ask questions about today’s visit, request a non-urgent call back, or ask for a work or school excuse for 24 hours related to this e-Visit. If it has been greater than 24 hours you will need to follow up with your provider, or enter a new e-Visit to address those concerns. ° °You will get an e-mail in the next   two days asking about your experience.  I hope that your e-visit has been valuable and will speed your recovery. Thank you for using e-visits.  I provided 5 minutes of non face-to-face time during this encounter for chart review and documentation.

## 2021-04-28 ENCOUNTER — Ambulatory Visit: Payer: 59 | Admitting: Pulmonary Disease

## 2021-05-03 ENCOUNTER — Other Ambulatory Visit (HOSPITAL_COMMUNITY): Payer: Self-pay

## 2021-05-13 ENCOUNTER — Other Ambulatory Visit (HOSPITAL_BASED_OUTPATIENT_CLINIC_OR_DEPARTMENT_OTHER): Payer: Self-pay

## 2021-05-13 DIAGNOSIS — I10 Essential (primary) hypertension: Secondary | ICD-10-CM | POA: Diagnosis not present

## 2021-05-13 MED ORDER — PROMETHAZINE-DM 6.25-15 MG/5ML PO SYRP
ORAL_SOLUTION | ORAL | 0 refills | Status: DC
  Filled 2021-05-13: qty 35, 2d supply, fill #0

## 2021-05-13 MED ORDER — AZITHROMYCIN 250 MG PO TABS
ORAL_TABLET | ORAL | 0 refills | Status: DC
  Filled 2021-05-13: qty 6, 5d supply, fill #0

## 2021-05-21 ENCOUNTER — Encounter: Payer: Self-pay | Admitting: Rheumatology

## 2021-05-27 ENCOUNTER — Other Ambulatory Visit (HOSPITAL_COMMUNITY): Payer: Self-pay

## 2021-06-08 ENCOUNTER — Ambulatory Visit: Payer: 59 | Admitting: Podiatry

## 2021-06-10 ENCOUNTER — Encounter: Payer: Self-pay | Admitting: Family Medicine

## 2021-06-10 DIAGNOSIS — D485 Neoplasm of uncertain behavior of skin: Secondary | ICD-10-CM | POA: Diagnosis not present

## 2021-06-10 DIAGNOSIS — L814 Other melanin hyperpigmentation: Secondary | ICD-10-CM | POA: Diagnosis not present

## 2021-06-10 DIAGNOSIS — D2221 Melanocytic nevi of right ear and external auricular canal: Secondary | ICD-10-CM | POA: Diagnosis not present

## 2021-06-10 DIAGNOSIS — L821 Other seborrheic keratosis: Secondary | ICD-10-CM | POA: Diagnosis not present

## 2021-06-10 DIAGNOSIS — D225 Melanocytic nevi of trunk: Secondary | ICD-10-CM | POA: Diagnosis not present

## 2021-06-22 ENCOUNTER — Other Ambulatory Visit (HOSPITAL_COMMUNITY): Payer: Self-pay

## 2021-07-01 ENCOUNTER — Other Ambulatory Visit (HOSPITAL_BASED_OUTPATIENT_CLINIC_OR_DEPARTMENT_OTHER): Payer: Self-pay

## 2021-07-01 MED ORDER — AMOXICILLIN 500 MG PO CAPS
ORAL_CAPSULE | ORAL | 12 refills | Status: DC
  Filled 2021-07-01: qty 12, 3d supply, fill #0
  Filled 2022-04-20: qty 12, 3d supply, fill #1

## 2021-07-07 NOTE — Progress Notes (Unsigned)
Office Visit Note  Patient: [name]             Date of Birth: 03-30-1966           MRN: [id number]             PCP: Caren Macadam, MD Referring: Caren Macadam, MD Visit Date: 07/21/2021 Occupation: '@GUAROCC'$ @  Subjective:  No chief complaint on file.   History of Present Illness: [name] is a 56 y.o. male ***   Activities of Daily Living:  Patient reports morning stiffness for *** {minute/hour:19697}.   Patient {ACTIONS;DENIES/REPORTS:21021675::"Denies"} nocturnal pain.  Difficulty dressing/grooming: {ACTIONS;DENIES/REPORTS:21021675::"Denies"} Difficulty climbing stairs: {ACTIONS;DENIES/REPORTS:21021675::"Denies"} Difficulty getting out of chair: {ACTIONS;DENIES/REPORTS:21021675::"Denies"} Difficulty using hands for taps, buttons, cutlery, and/or writing: {ACTIONS;DENIES/REPORTS:21021675::"Denies"}  No Rheumatology ROS completed.   PMFS History:  Patient Active Problem List   Diagnosis Date Noted   Corns and callosities 01/15/2020   Hammer toes, bilateral 01/15/2020   Orthopnea 11/01/2019   History of COVID-19 07/30/2019   Acute deep vein thrombosis (DVT) of left lower extremity ([place]) 07/30/2019   Abnormal CT of the chest 07/30/2019   Cough 07/30/2019   Leg DVT (deep venous thromboembolism), acute, bilateral ([place]) 07/24/2019   HTN (hypertension) 07/24/2019   COVID-19 virus infection 07/20/2019   Acute respiratory failure with hypoxia ([place]) 07/20/2019   Acute pulmonary embolism ([place]) 07/19/2019   Essential tremor 04/03/2017   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   High risk medications (not anticoagulants) long-term use 09/20/2016   Bilateral hand pain 09/20/2016   DDD (degenerative disc disease), cervical 09/20/2016   History of total hip replacement, bilateral 09/20/2016   DDD (degenerative disc disease), lumbar 09/20/2016   Bilateral plantar fasciitis 09/20/2016   Psoriasis 09/20/2016   Primary osteoarthritis of both feet  09/20/2016   Primary osteoarthritis of both hands 09/20/2016   Primary osteoarthritis of both knees 09/20/2016   Prediabetes 09/22/2014   Hyperlipidemia 09/22/2014   URI (upper respiratory infection) 07/07/2012   Acute bronchitis 07/07/2012   OBESITY 07/03/2008   AVASCULAR NECROSIS, FEMORAL HEAD 07/03/2008   Psoriatic arthritis ([place]) 05/02/2001    Past Medical History:  Diagnosis Date   Anemia    History of avascular necrosis of capital femoral epiphysis 2006, 2007   Bilateral Femoral Head   HLD (hyperlipidemia)    Pneumonia    twice   Psoriatic arthritis ([place])     Family History  Problem Relation Age of Onset   Pneumonia Father    Tremor Neg Hx    Past Surgical History:  Procedure Laterality Date   JOINT REPLACEMENT Bilateral 2006, 2007   Necrosis femoral head (bilateral)   TOTAL HIP REVISION Right 11/23/2016   Procedure: Acetabulum liner and femoral head revision;  Surgeon: [name], MD;  Location: [facility];  Service: Orthopedics;  Laterality: Right;   TOTAL HIP REVISION Left 04/10/2019   Procedure: Left hip bearing surface vs total hip arthroplasty revision;  Surgeon: [name], MD;  Location: [facility];  Service: Orthopedics;  Laterality: Left;  121mn   Social History   Social History Narrative   Lives at home w/ his wife   Right-handed   Caffeine: 2-3 cups per day   Immunization History  Administered Date(s) Administered   Influenza Inj Mdck Quad Pf 02/01/2019   Influenza,inj,Quad PF,6+ Mos 02/03/2017, 03/21/2018, 01/06/2021   Influenza,inj,quad, With Preservative 02/01/2019   Influenza-Unspecified 02/03/2017, 03/21/2018, 02/01/2019   PFIZER(Purple Top)SARS-COV-2 Vaccination 10/06/2019, 10/27/2019   Pneumococcal Conjugate-13 03/21/2018   Tdap 09/22/2014  Zoster Recombinat (Shingrix) 04/02/2018, 05/23/2018   Zoster, Live 04/02/2018, 05/23/2018     Objective: Vital Signs: There were no vitals taken for this visit.   Physical Exam   Musculoskeletal  Exam: ***  CDAI Exam: CDAI Score: -- Patient Global: --; Provider Global: -- Swollen: --; Tender: -- Joint Exam 07/21/2021   No joint exam has been documented for this visit   There is currently no information documented on the homunculus. Go to the Rheumatology activity and complete the homunculus joint exam.  Investigation: No additional findings.  Imaging: No results found.  Recent Labs: Lab Results  Component Value Date   WBC 5.0 02/17/2021   HGB 16.7 02/17/2021   PLT 140 02/17/2021   NA 141 02/17/2021   K 5.0 02/17/2021   CL 104 02/17/2021   CO2 27 02/17/2021   GLUCOSE 83 02/17/2021   BUN 20 02/17/2021   CREATININE 1.02 02/17/2021   BILITOT 0.7 02/17/2021   ALKPHOS 97 07/30/2019   AST 26 02/17/2021   ALT 26 02/17/2021   PROT 7.6 02/17/2021   ALBUMIN 4.6 07/30/2019   CALCIUM 9.9 02/17/2021   GFRAA 94 09/16/2020   QFTBGOLDPLUS NEGATIVE 02/17/2021    Speciality Comments: No specialty comments available.  Procedures:  No procedures performed Allergies: Gold-containing drug products   Assessment / Plan:     Visit Diagnoses: No diagnosis found.  Orders: No orders of the defined types were placed in this encounter.  No orders of the defined types were placed in this encounter.   Face-to-face time spent with patient was *** minutes. Greater than 50% of time was spent in counseling and coordination of care.  Follow-Up Instructions: No follow-ups on file.   Earnestine Mealing, CMA  Note - This record has been created using Editor, commissioning.  Chart creation errors have been sought, but may not always  have been located. Such creation errors do not reflect on  the standard of medical care.

## 2021-07-16 ENCOUNTER — Other Ambulatory Visit (HOSPITAL_COMMUNITY): Payer: Self-pay

## 2021-07-21 ENCOUNTER — Encounter: Payer: Self-pay | Admitting: Rheumatology

## 2021-07-21 ENCOUNTER — Ambulatory Visit (INDEPENDENT_AMBULATORY_CARE_PROVIDER_SITE_OTHER): Payer: 59 | Admitting: Rheumatology

## 2021-07-21 ENCOUNTER — Other Ambulatory Visit: Payer: Self-pay

## 2021-07-21 VITALS — BP 164/80 | HR 69 | Ht 69.0 in | Wt 215.0 lb

## 2021-07-21 DIAGNOSIS — M5030 Other cervical disc degeneration, unspecified cervical region: Secondary | ICD-10-CM | POA: Diagnosis not present

## 2021-07-21 DIAGNOSIS — M19042 Primary osteoarthritis, left hand: Secondary | ICD-10-CM

## 2021-07-21 DIAGNOSIS — M170 Bilateral primary osteoarthritis of knee: Secondary | ICD-10-CM | POA: Diagnosis not present

## 2021-07-21 DIAGNOSIS — Z8616 Personal history of COVID-19: Secondary | ICD-10-CM

## 2021-07-21 DIAGNOSIS — Z79899 Other long term (current) drug therapy: Secondary | ICD-10-CM

## 2021-07-21 DIAGNOSIS — M19071 Primary osteoarthritis, right ankle and foot: Secondary | ICD-10-CM | POA: Diagnosis not present

## 2021-07-21 DIAGNOSIS — R5383 Other fatigue: Secondary | ICD-10-CM

## 2021-07-21 DIAGNOSIS — L4050 Arthropathic psoriasis, unspecified: Secondary | ICD-10-CM

## 2021-07-21 DIAGNOSIS — M19041 Primary osteoarthritis, right hand: Secondary | ICD-10-CM | POA: Diagnosis not present

## 2021-07-21 DIAGNOSIS — Z8679 Personal history of other diseases of the circulatory system: Secondary | ICD-10-CM

## 2021-07-21 DIAGNOSIS — L409 Psoriasis, unspecified: Secondary | ICD-10-CM

## 2021-07-21 DIAGNOSIS — Z96643 Presence of artificial hip joint, bilateral: Secondary | ICD-10-CM

## 2021-07-21 DIAGNOSIS — M5136 Other intervertebral disc degeneration, lumbar region: Secondary | ICD-10-CM

## 2021-07-21 DIAGNOSIS — Z86711 Personal history of pulmonary embolism: Secondary | ICD-10-CM

## 2021-07-21 DIAGNOSIS — M19072 Primary osteoarthritis, left ankle and foot: Secondary | ICD-10-CM

## 2021-07-21 DIAGNOSIS — Z87898 Personal history of other specified conditions: Secondary | ICD-10-CM

## 2021-07-21 DIAGNOSIS — Z8639 Personal history of other endocrine, nutritional and metabolic disease: Secondary | ICD-10-CM

## 2021-08-05 ENCOUNTER — Other Ambulatory Visit (HOSPITAL_BASED_OUTPATIENT_CLINIC_OR_DEPARTMENT_OTHER): Payer: Self-pay

## 2021-08-05 MED ORDER — AMOXICILLIN 500 MG PO CAPS
ORAL_CAPSULE | ORAL | 1 refills | Status: DC
  Filled 2021-08-05: qty 21, 7d supply, fill #0

## 2021-08-09 ENCOUNTER — Telehealth: Payer: Self-pay

## 2021-08-09 NOTE — Telephone Encounter (Signed)
Attempted to call patient to get him scheduled for follow up office visit with Dr. Margaretann Loveless. Unable to reach, left message to return call to get scheduled.   ?

## 2021-08-11 ENCOUNTER — Ambulatory Visit: Payer: 59 | Admitting: Podiatry

## 2021-08-16 ENCOUNTER — Other Ambulatory Visit (HOSPITAL_COMMUNITY): Payer: Self-pay

## 2021-08-18 ENCOUNTER — Encounter: Payer: Self-pay | Admitting: Rheumatology

## 2021-11-10 ENCOUNTER — Ambulatory Visit: Payer: 59 | Admitting: Podiatry

## 2021-12-06 ENCOUNTER — Other Ambulatory Visit (HOSPITAL_BASED_OUTPATIENT_CLINIC_OR_DEPARTMENT_OTHER): Payer: Self-pay

## 2021-12-06 MED ORDER — AMOXICILLIN 500 MG PO CAPS
ORAL_CAPSULE | ORAL | 0 refills | Status: DC
  Filled 2021-12-06: qty 28, 7d supply, fill #0

## 2021-12-10 NOTE — Progress Notes (Unsigned)
Office Visit Note  Patient: [name]             Date of Birth: 02/05/1966           MRN: [id number]             PCP: Caren Macadam, MD (Inactive) Referring: Caren Macadam, MD Visit Date: 12/23/2021 Occupation: '@GUAROCC'$ @  Subjective:  Joint stiffness   History of Present Illness: [name] is a 56 y.o. male with history of psoriatic arthritis, osteoarthritis, and DDD. He is not currently taking any immunosuppressive agents.  He discontinued Enbrel in November 2022.  He denies any signs or symptoms of a psoriatic arthritis flare since discontinuing therapy.  He takes exercising Tylenol every morning for pain relief.  He experiences some joint stiffness after sitting for prolonged periods of time but denies any morning stiffness.  He has not noticed any joint swelling.  He denies any Achilles tendinitis or plantar fasciitis.  He denies any SI joint discomfort at this time.  He has not had any nocturnal pain or difficulty with ADLs.  He denies any active psoriasis at this time.  He continues to try to go to the gym on a daily basis for exercise.  He denies any new medical conditions.  He has not had any recent or recurrent infections.   Activities of Daily Living:  Patient reports morning stiffness for 0 minute.   Patient Denies nocturnal pain.  Difficulty dressing/grooming: Denies Difficulty climbing stairs: Denies Difficulty getting out of chair: Denies Difficulty using hands for taps, buttons, cutlery, and/or writing: Denies  Review of Systems  Constitutional:  Negative for fatigue.  HENT:  Negative for mouth sores and mouth dryness.   Eyes:  Negative for dryness.  Respiratory:  Negative for shortness of breath.   Cardiovascular:  Negative for chest pain and palpitations.  Gastrointestinal:  Negative for blood in stool, constipation and diarrhea.  Endocrine: Negative for increased urination.  Genitourinary:  Negative for involuntary urination.   Musculoskeletal:  Negative for joint pain, gait problem, joint pain, joint swelling, myalgias, muscle weakness, morning stiffness, muscle tenderness and myalgias.  Skin:  Negative for color change, rash, hair loss and sensitivity to sunlight.  Allergic/Immunologic: Negative for susceptible to infections.  Neurological:  Negative for dizziness and headaches.  Hematological:  Negative for swollen glands.  Psychiatric/Behavioral:  Negative for depressed mood and sleep disturbance. The patient is not nervous/anxious.     PMFS History:  Patient Active Problem List   Diagnosis Date Noted   Corns and callosities 01/15/2020   Hammer toes, bilateral 01/15/2020   Orthopnea 11/01/2019   History of COVID-19 07/30/2019   Acute deep vein thrombosis (DVT) of left lower extremity ([place]) 07/30/2019   Abnormal CT of the chest 07/30/2019   Cough 07/30/2019   Leg DVT (deep venous thromboembolism), acute, bilateral ([place]) 07/24/2019   HTN (hypertension) 07/24/2019   COVID-19 virus infection 07/20/2019   Acute respiratory failure with hypoxia ([place]) 07/20/2019   Acute pulmonary embolism ([place]) 07/19/2019   Essential tremor 04/03/2017   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/23/2016   High risk medications (not anticoagulants) long-term use 09/20/2016   Bilateral hand pain 09/20/2016   DDD (degenerative disc disease), cervical 09/20/2016   History of total hip replacement, bilateral 09/20/2016   DDD (degenerative disc disease), lumbar 09/20/2016   Bilateral plantar fasciitis 09/20/2016   Psoriasis 09/20/2016   Primary osteoarthritis of both feet 09/20/2016   Primary osteoarthritis of both hands 09/20/2016  Primary osteoarthritis of both knees 09/20/2016   Prediabetes 09/22/2014   Hyperlipidemia 09/22/2014   URI (upper respiratory infection) 07/07/2012   Acute bronchitis 07/07/2012   OBESITY 07/03/2008   AVASCULAR NECROSIS, FEMORAL HEAD 07/03/2008   Psoriatic arthritis ([place]) 05/02/2001    Past  Medical History:  Diagnosis Date   Anemia    History of avascular necrosis of capital femoral epiphysis 2006, 2007   Bilateral Femoral Head   HLD (hyperlipidemia)    Pneumonia    twice   Psoriatic arthritis ([place])     Family History  Problem Relation Age of Onset   Pneumonia Father    Tremor Neg Hx    Past Surgical History:  Procedure Laterality Date   JOINT REPLACEMENT Bilateral 2006, 2007   Necrosis femoral head (bilateral)   TOTAL HIP REVISION Right 11/23/2016   Procedure: Acetabulum liner and femoral head revision;  Surgeon: [name], MD;  Location: [facility];  Service: Orthopedics;  Laterality: Right;   TOTAL HIP REVISION Left 04/10/2019   Procedure: Left hip bearing surface vs total hip arthroplasty revision;  Surgeon: [name], MD;  Location: [facility];  Service: Orthopedics;  Laterality: Left;  162mn   Social History   Social History Narrative   Lives at home w/ his wife   Right-handed   Caffeine: 2-3 cups per day   Immunization History  Administered Date(s) Administered   Influenza Inj Mdck Quad Pf 02/01/2019   Influenza,inj,Quad PF,6+ Mos 02/03/2017, 03/21/2018, 01/06/2021   Influenza,inj,quad, With Preservative 02/01/2019   Influenza-Unspecified 02/03/2017, 03/21/2018, 02/01/2019   PFIZER(Purple Top)SARS-COV-2 Vaccination 10/06/2019, 10/27/2019   Pneumococcal Conjugate-13 03/21/2018   Tdap 09/22/2014   Zoster Recombinat (Shingrix) 04/02/2018, 05/23/2018   Zoster, Live 04/02/2018, 05/23/2018     Objective: Vital Signs: BP (!) 149/83 (BP Location: Left Arm, Patient Position: Sitting, Cuff Size: Large)   Pulse (!) 59   Resp 15   Ht '5\' 9"'$  (1.753 m)   Wt 203 lb (92.1 kg)   BMI 29.98 kg/m    Physical Exam Vitals and nursing note reviewed.  Constitutional:      Appearance: He is well-developed.  HENT:     Head: Normocephalic and atraumatic.  Eyes:     Conjunctiva/sclera: Conjunctivae normal.     Pupils: Pupils are equal, round, and reactive to  light.  Cardiovascular:     Rate and Rhythm: Normal rate and regular rhythm.     Heart sounds: Normal heart sounds.  Pulmonary:     Effort: Pulmonary effort is normal.     Breath sounds: Normal breath sounds.  Abdominal:     General: Bowel sounds are normal.     Palpations: Abdomen is soft.  Musculoskeletal:     Cervical back: Normal range of motion and neck supple.  Skin:    General: Skin is warm and dry.     Capillary Refill: Capillary refill takes less than 2 seconds.  Neurological:     Mental Status: He is alert and oriented to person, place, and time.  Psychiatric:        Behavior: Behavior normal.      Musculoskeletal Exam: C-spine has limited range of motion with lateral rotation and extension.  Thoracic kyphosis noted.  No midline spinal tenderness or SI joint tenderness.  Shoulder joints have good range of motion with no discomfort.  Bilateral flexion contractures in both elbows.  Limited range of motion of both wrist joints but no tenderness or synovitis noted.  He has PIP and DIP thickening consistent  with osteoarthritis of both hands.  Incomplete fist formation due to limited PIP flexion and extension.  Hip replacements have good range of motion with no groin pain.  Knee joints have good range of motion with no warmth or effusion.  No tenderness or swelling over ankle joints.  No evidence of Achilles tendinitis.  CDAI Exam: CDAI Score: -- Patient Global: --; Provider Global: -- Swollen: --; Tender: -- Joint Exam 12/23/2021   No joint exam has been documented for this visit   There is currently no information documented on the homunculus. Go to the Rheumatology activity and complete the homunculus joint exam.  Investigation: No additional findings.  Imaging: No results found.  Recent Labs: Lab Results  Component Value Date   WBC 5.0 02/17/2021   HGB 16.7 02/17/2021   PLT 140 02/17/2021   NA 141 02/17/2021   K 5.0 02/17/2021   CL 104 02/17/2021   CO2 27  02/17/2021   GLUCOSE 83 02/17/2021   BUN 20 02/17/2021   CREATININE 1.02 02/17/2021   BILITOT 0.7 02/17/2021   ALKPHOS 97 07/30/2019   AST 26 02/17/2021   ALT 26 02/17/2021   PROT 7.6 02/17/2021   ALBUMIN 4.6 07/30/2019   CALCIUM 9.9 02/17/2021   GFRAA 94 09/16/2020   QFTBGOLDPLUS NEGATIVE 02/17/2021    Speciality Comments: No specialty comments available.  Procedures:  No procedures performed Allergies: Gold-containing drug products   Assessment / Plan:     Visit Diagnoses: Psoriatic arthritis ([place]): He has no synovitis or dactylitis on examination.  He has not had any signs or symptoms of a psoriatic arthritis flare.  He is not currently taking any immunosuppressive agents.  He has been off of Enbrel since November 2022 due to recurrent upper respiratory tract infections.  He has not had any recent or recurrent infections since then.  He has no Achilles tendinitis or plantar fasciitis.  No SI joint tenderness upon palpation.  No active psoriasis at this time.  He has been exercising at the gym on a daily basis without difficulty.  He has not had any morning stiffness or nocturnal pain.  He experiences some joint stiffness after sitting for prolonged periods of time.  He was advised to notify us if he develops increased joint pain or joint swelling.  He does not require immunosuppressive therapy at this time.  He will follow-up in the office in 5 months or sooner if needed.  Psoriasis: He has no active psoriasis at this time.  High risk medication use - (Enbrel 50 mg sq injections every 7 days. -Discontinued November 2022.)  He does not require immunosuppressive therapy at this time.  Primary osteoarthritis of both hands: He has PIP and DIP thickening consistent with osteoarthritis of both hands.  Incomplete fist formation due to limited extension and flexion of the PIP joints.  Discussed the importance of joint protection and muscle strengthening.  He was encouraged to perform hand  exercises daily.  History of total hip replacement, bilateral: Doing well.  Good range of motion with no groin pain.  Primary osteoarthritis of both knees: He has good range of motion of both knee joints with no discomfort.  No warmth or effusion noted.  Primary osteoarthritis of both feet - Followed by Dr. Prudence Davidson at [facility].  Hammertoes noted bilaterally.   DDD (degenerative disc disease), cervical: C-spine is limited range of motion without rotation and extension.  No symptoms of radiculopathy at this time.  DDD (degenerative disc disease), lumbar: No  midline spinal tenderness at this time.  No symptoms of radiculopathy.  Other medical conditions are listed as follows:  Other fatigue: He has been trying to exercise on a daily basis.  History of prediabetes  History of hyperlipidemia  History of hypertension  History of pulmonary embolism - After COVID-19 infection March 2021. He is no longer taking eliquis.   History of vitamin D deficiency  History of COVID-19 - March 2021.  Patient states he has had chronic cough since he had COVID-19 infection.  Orders: No orders of the defined types were placed in this encounter.  No orders of the defined types were placed in this encounter.   Follow-Up Instructions: Return in about 5 months (around 05/25/2022) for Psoriatic arthritis, Osteoarthritis, DDD.   Ofilia Neas, PA-C  Note - This record has been created using Dragon software.  Chart creation errors have been sought, but may not always  have been located. Such creation errors do not reflect on  the standard of medical care.

## 2021-12-23 ENCOUNTER — Ambulatory Visit: Payer: 59 | Attending: Physician Assistant | Admitting: Physician Assistant

## 2021-12-23 ENCOUNTER — Encounter: Payer: Self-pay | Admitting: Physician Assistant

## 2021-12-23 VITALS — BP 149/83 | HR 59 | Resp 15 | Ht 69.0 in | Wt 203.0 lb

## 2021-12-23 DIAGNOSIS — M5030 Other cervical disc degeneration, unspecified cervical region: Secondary | ICD-10-CM

## 2021-12-23 DIAGNOSIS — L409 Psoriasis, unspecified: Secondary | ICD-10-CM | POA: Diagnosis not present

## 2021-12-23 DIAGNOSIS — M170 Bilateral primary osteoarthritis of knee: Secondary | ICD-10-CM

## 2021-12-23 DIAGNOSIS — L4050 Arthropathic psoriasis, unspecified: Secondary | ICD-10-CM

## 2021-12-23 DIAGNOSIS — Z8639 Personal history of other endocrine, nutritional and metabolic disease: Secondary | ICD-10-CM

## 2021-12-23 DIAGNOSIS — Z79899 Other long term (current) drug therapy: Secondary | ICD-10-CM

## 2021-12-23 DIAGNOSIS — M5136 Other intervertebral disc degeneration, lumbar region: Secondary | ICD-10-CM | POA: Diagnosis not present

## 2021-12-23 DIAGNOSIS — M19042 Primary osteoarthritis, left hand: Secondary | ICD-10-CM

## 2021-12-23 DIAGNOSIS — M19041 Primary osteoarthritis, right hand: Secondary | ICD-10-CM

## 2021-12-23 DIAGNOSIS — Z96643 Presence of artificial hip joint, bilateral: Secondary | ICD-10-CM

## 2021-12-23 DIAGNOSIS — M19072 Primary osteoarthritis, left ankle and foot: Secondary | ICD-10-CM

## 2021-12-23 DIAGNOSIS — Z87898 Personal history of other specified conditions: Secondary | ICD-10-CM

## 2021-12-23 DIAGNOSIS — M19071 Primary osteoarthritis, right ankle and foot: Secondary | ICD-10-CM

## 2021-12-23 DIAGNOSIS — Z86711 Personal history of pulmonary embolism: Secondary | ICD-10-CM

## 2021-12-23 DIAGNOSIS — Z8616 Personal history of COVID-19: Secondary | ICD-10-CM

## 2021-12-23 DIAGNOSIS — Z8679 Personal history of other diseases of the circulatory system: Secondary | ICD-10-CM

## 2021-12-23 DIAGNOSIS — R5383 Other fatigue: Secondary | ICD-10-CM

## 2021-12-23 NOTE — Patient Instructions (Signed)
Hand Exercises Hand exercises can be helpful for almost anyone. These exercises can strengthen the hands, improve flexibility and movement, and increase blood flow to the hands. These results can make work and daily tasks easier. Hand exercises can be especially helpful for people who have joint pain from arthritis or have nerve damage from overuse (carpal tunnel syndrome). These exercises can also help people who have injured a hand. Exercises Most of these hand exercises are gentle stretching and motion exercises. It is usually safe to do them often throughout the day. Warming up your hands before exercise may help to reduce stiffness. You can do this with gentle massage or by placing your hands in warm water for 10-15 minutes. It is normal to feel some stretching, pulling, tightness, or mild discomfort as you begin new exercises. This will gradually improve. Stop an exercise right away if you feel sudden, severe pain or your pain gets worse. Ask your health care provider which exercises are best for you. Knuckle bend or "claw" fist  Stand or sit with your arm, hand, and all five fingers pointed straight up. Make sure to keep your wrist straight during the exercise. Gently bend your fingers down toward your palm until the tips of your fingers are touching the top of your palm. Keep your big knuckle straight and just bend the small knuckles in your fingers. Hold this position for __________ seconds. Straighten (extend) your fingers back to the starting position. Repeat this exercise 5-10 times with each hand. Full finger fist  Stand or sit with your arm, hand, and all five fingers pointed straight up. Make sure to keep your wrist straight during the exercise. Gently bend your fingers into your palm until the tips of your fingers are touching the middle of your palm. Hold this position for __________ seconds. Extend your fingers back to the starting position, stretching every joint fully. Repeat  this exercise 5-10 times with each hand. Straight fist Stand or sit with your arm, hand, and all five fingers pointed straight up. Make sure to keep your wrist straight during the exercise. Gently bend your fingers at the big knuckle, where your fingers meet your hand, and the middle knuckle. Keep the knuckle at the tips of your fingers straight and try to touch the bottom of your palm. Hold this position for __________ seconds. Extend your fingers back to the starting position, stretching every joint fully. Repeat this exercise 5-10 times with each hand. Tabletop  Stand or sit with your arm, hand, and all five fingers pointed straight up. Make sure to keep your wrist straight during the exercise. Gently bend your fingers at the big knuckle, where your fingers meet your hand, as far down as you can while keeping the small knuckles in your fingers straight. Think of forming a tabletop with your fingers. Hold this position for __________ seconds. Extend your fingers back to the starting position, stretching every joint fully. Repeat this exercise 5-10 times with each hand. Finger spread  Place your hand flat on a table with your palm facing down. Make sure your wrist stays straight as you do this exercise. Spread your fingers and thumb apart from each other as far as you can until you feel a gentle stretch. Hold this position for __________ seconds. Bring your fingers and thumb tight together again. Hold this position for __________ seconds. Repeat this exercise 5-10 times with each hand. Making circles  Stand or sit with your arm, hand, and all five fingers pointed   straight up. Make sure to keep your wrist straight during the exercise. Make a circle by touching the tip of your thumb to the tip of your index finger. Hold for __________ seconds. Then open your hand wide. Repeat this motion with your thumb and each finger on your hand. Repeat this exercise 5-10 times with each hand. Thumb  motion  Sit with your forearm resting on a table and your wrist straight. Your thumb should be facing up toward the ceiling. Keep your fingers relaxed as you move your thumb. Lift your thumb up as high as you can toward the ceiling. Hold for __________ seconds. Bend your thumb across your palm as far as you can, reaching the tip of your thumb for the small finger (pinkie) side of your palm. Hold for __________ seconds. Repeat this exercise 5-10 times with each hand. Grip strengthening  Hold a stress ball or other soft ball in the middle of your hand. Slowly increase the pressure, squeezing the ball as much as you can without causing pain. Think of bringing the tips of your fingers into the middle of your palm. All of your finger joints should bend when doing this exercise. Hold your squeeze for __________ seconds, then relax. Repeat this exercise 5-10 times with each hand. Contact a health care provider if: Your hand pain or discomfort gets much worse when you do an exercise. Your hand pain or discomfort does not improve within 2 hours after you exercise. If you have any of these problems, stop doing these exercises right away. Do not do them again unless your health care provider says that you can. Get help right away if: You develop sudden, severe hand pain or swelling. If this happens, stop doing these exercises right away. Do not do them again unless your health care provider says that you can. This information is not intended to replace advice given to you by your health care provider. Make sure you discuss any questions you have with your health care provider. Document Revised: 08/06/2020 Document Reviewed: 08/06/2020 Elsevier Patient Education  2023 [employer]  

## 2022-01-23 ENCOUNTER — Telehealth: Payer: 59 | Admitting: Family

## 2022-01-23 DIAGNOSIS — J019 Acute sinusitis, unspecified: Secondary | ICD-10-CM | POA: Diagnosis not present

## 2022-01-23 MED ORDER — AMOXICILLIN-POT CLAVULANATE 875-125 MG PO TABS: 1.0000 | ORAL_TABLET | Freq: Two times a day (BID) | ORAL | 0 refills | Status: DC

## 2022-01-23 NOTE — Progress Notes (Signed)

## 2022-01-26 ENCOUNTER — Other Ambulatory Visit: Payer: Self-pay

## 2022-01-26 ENCOUNTER — Emergency Department (HOSPITAL_BASED_OUTPATIENT_CLINIC_OR_DEPARTMENT_OTHER)
Admission: EM | Admit: 2022-01-26 | Discharge: 2022-01-26 | Disposition: A | Discharge status: Home / Self Care | Payer: 59 | Attending: Emergency Medicine | Admitting: Emergency Medicine

## 2022-01-26 ENCOUNTER — Encounter (HOSPITAL_BASED_OUTPATIENT_CLINIC_OR_DEPARTMENT_OTHER): Payer: Self-pay

## 2022-01-26 ENCOUNTER — Emergency Department (HOSPITAL_BASED_OUTPATIENT_CLINIC_OR_DEPARTMENT_OTHER): Payer: 59

## 2022-01-26 ENCOUNTER — Telehealth: Payer: Self-pay | Admitting: *Deleted

## 2022-01-26 DIAGNOSIS — R252 Cramp and spasm: Secondary | ICD-10-CM

## 2022-01-26 DIAGNOSIS — Z79899 Other long term (current) drug therapy: Secondary | ICD-10-CM | POA: Insufficient documentation

## 2022-01-26 DIAGNOSIS — Z794 Long term (current) use of insulin: Secondary | ICD-10-CM | POA: Diagnosis not present

## 2022-01-26 DIAGNOSIS — M79604 Pain in right leg: Secondary | ICD-10-CM | POA: Diagnosis not present

## 2022-01-26 DIAGNOSIS — M79661 Pain in right lower leg: Secondary | ICD-10-CM | POA: Diagnosis not present

## 2022-01-26 MED ORDER — CYCLOBENZAPRINE HCL 10 MG PO TABS
10.0000 mg | ORAL_TABLET | Freq: Two times a day (BID) | ORAL | 0 refills | Status: DC | PRN
  Filled 2022-01-27: qty 14, 7d supply, fill #0

## 2022-01-26 NOTE — ED Notes (Signed)
Discharge paperwork given and verbally understood. 

## 2022-01-26 NOTE — ED Triage Notes (Signed)
He reports feeling right calf pain upin arising this  morning "so bad I had to use a cane for a little while". He further tells me that he has "never had anything like this before". He tells me the calf pain has completely resolved. He is ambulatory and in no distress.

## 2022-01-26 NOTE — Telephone Encounter (Signed)
Returned PC to patient, he states he had sudden sharp pain in his L calf this a.m., pain is much less, only slight tenderness.  Denies any swelling, redness or heat in calf.  Patient has hx of PE, advised patient to go to ED to be evaluated for DVT, he verbalizes understanding.

## 2022-01-26 NOTE — Discharge Instructions (Signed)
Your history, exam, and evaluation today ruled out DVT as the cause of your right leg pain.  Given the context of with movement and the location of discomfort I suspect muscle cramp and spasm recurrence.  Please use the muscle relaxant if the pain turns and please rest and stay hydrated.  If any symptoms change or worsen acutely, please return to the nearest emergency department.

## 2022-01-26 NOTE — ED Provider Notes (Signed)
[facility] EMERGENCY DEPT Provider Note   CSN: [id number] Arrival date & time: 01/26/22  0175     History  Chief Complaint  Patient presents with   calf pain    [name] is a 56 y.o. male.  The history is provided by the patient and medical records. No language interpreter was used.  Leg Pain Location:  Leg Time since incident:  2 hours Injury: no   Leg location:  R lower leg Pain details:    Quality:  Cramping   Radiates to:  Does not radiate   Severity:  Moderate   Onset quality:  Sudden   Timing:  Rare   Progression:  Resolved Chronicity:  New Dislocation: no   Prior injury to area:  No Relieved by:  Nothing Worsened by:  Nothing Ineffective treatments:  None tried Associated symptoms: no back pain, no fatigue, no fever, no neck pain, no numbness, no stiffness, no swelling and no tingling        Home Medications Prior to Admission medications   Medication Sig Start Date End Date Taking? Authorizing Provider  acetaminophen (TYLENOL) 500 MG tablet Take 1,000 mg by mouth every 8 (eight) hours as needed for mild pain or moderate pain.     [provider]  amoxicillin (AMOXIL) 500 MG capsule Take 4 capsules by mouth 1 hour prior to dental appointment. Patient not taking: Reported on 12/23/2021 07/01/21     amoxicillin (AMOXIL) 500 MG capsule 2 STAT THEN 1 TID UNTIL GONE. Patient not taking: Reported on 12/23/2021 08/05/21     amoxicillin (AMOXIL) 500 MG capsule Take 1 capsule by mouth four times daily until all gone. Patient not taking: Reported on 12/23/2021 12/06/21     amoxicillin-clavulanate (AUGMENTIN) 875-125 MG tablet Take 1 tablet by mouth 2 (two) times daily. 01/23/22   Sharion Balloon, FNP  cetirizine (ZYRTEC) 10 MG tablet Take 1 tablet (10 mg total) by mouth daily. 08/15/19   Fenton Foy, NP  cholecalciferol (VITAMIN D) 1000 units tablet Take 2,000 Units by mouth daily.     [provider]  Coenzyme Q10 (CO Q 10 PO) Take  600 mg by mouth daily.    [provider]  fluticasone (FLONASE) 50 MCG/ACT nasal spray Place into both nostrils daily. Patient not taking: Reported on 07/21/2021    [provider]  Glutathione POWD 500 g by Does not apply route daily. 01/06/21   Caren Macadam, MD  ibuprofen (ADVIL) 200 MG tablet Take 200 mg by mouth as needed. Patient not taking: Reported on 12/23/2021    [provider]  Insulin Pen Needle 31G X 6 MM MISC Use as directed within package instructions 02/25/21   Margie Ege, [facility]  KRILL OIL PO Take 500 mg by mouth daily. Patient not taking: Reported on 12/23/2021    [provider]  Magnesium 250 MG TABS Take 250 mg by mouth daily.    [provider]  Melatonin 5 MG CAPS as needed.    [provider]  Multiple Vitamins-Minerals (IMMUNE SUPPORT PO) Take 1,000 mg by mouth daily. Patient not taking: Reported on 12/23/2021    [provider]  mupirocin ointment (BACTROBAN) 2 % Apply 1 application topically 3 (three) times daily. Patient not taking: Reported on 02/17/2021 12/09/20   Caren Macadam, MD  oseltamivir (TAMIFLU) 75 MG capsule Take 1 capsule (75 mg total) by mouth 2 (two) times daily. 04/27/21   Mar Daring, PA-C  promethazine-dextromethorphan (PROMETHAZINE-DM)  6.25-15 MG/5ML syrup Take 5 mLs by mouth 4 (four)  times daily as needed for up to 7 days for Cough. Patient not taking: Reported on 07/21/2021 05/13/21     TURMERIC PO Take 1,000 mg by mouth daily.    [provider]      Allergies    Gold-containing drug products    Review of Systems   Review of Systems  Constitutional:  Negative for chills, fatigue and fever.  HENT:  Negative for congestion.   Respiratory:  Negative for cough, chest tightness, shortness of breath and wheezing.   Cardiovascular:  Negative for chest pain.  Gastrointestinal:  Negative for abdominal pain, constipation, diarrhea, nausea and vomiting.   Genitourinary:  Negative for flank pain.  Musculoskeletal:  Negative for back pain, neck pain, neck stiffness and stiffness.  Skin:  Negative for rash and wound.  Neurological:  Negative for headaches.  Psychiatric/Behavioral:  Negative for agitation and confusion.   All other systems reviewed and are negative.   Physical Exam Updated Vital Signs BP (!) 152/76 (BP Location: Right Arm)   Pulse 64   Temp 98.2 F (36.8 C) (Oral)   Resp 16   SpO2 96%  Physical Exam Vitals and nursing note reviewed.  Constitutional:      General: He is not in acute distress.    Appearance: He is well-developed. He is not ill-appearing, toxic-appearing or diaphoretic.  HENT:     Head: Normocephalic and atraumatic.  Eyes:     Conjunctiva/sclera: Conjunctivae normal.  Cardiovascular:     Rate and Rhythm: Normal rate and regular rhythm.     Heart sounds: No murmur heard. Pulmonary:     Effort: Pulmonary effort is normal. No respiratory distress.     Breath sounds: Normal breath sounds. No wheezing, rhonchi or rales.  Chest:     Chest wall: No tenderness.  Abdominal:     Palpations: Abdomen is soft.     Tenderness: There is no abdominal tenderness. There is no guarding or rebound.  Musculoskeletal:        General: No swelling, tenderness or signs of injury.     Cervical back: Neck supple.     Right lower leg: No edema.     Left lower leg: No edema.     Comments: No tenderness in calf.  Intact sensation, strength, and pulses distally.  No other tenderness present.  Skin:    General: Skin is warm and dry.     Capillary Refill: Capillary refill takes less than 2 seconds.     Findings: No erythema.  Neurological:     General: No focal deficit present.     Mental Status: He is alert.  Psychiatric:        Mood and Affect: Mood normal.     ED Results / Procedures / Treatments   Labs (all labs ordered are listed, but only abnormal results are displayed) Labs Reviewed - No data to  display  EKG None  Radiology US Venous Img Lower Unilateral Right  Result Date: 01/26/2022 CLINICAL DATA:  Right calf pain and history of prior DVT. EXAM: RIGHT LOWER EXTREMITY VENOUS DOPPLER ULTRASOUND TECHNIQUE: Gray-scale sonography with graded compression, as well as color Doppler and duplex ultrasound were performed to evaluate the lower extremity deep venous systems from the level of the common femoral vein and including the common femoral, femoral, profunda femoral, popliteal and calf veins including the posterior tibial, peroneal and gastrocnemius veins when visible. The superficial great saphenous vein was  also interrogated. Spectral Doppler was utilized to evaluate flow at rest and with distal augmentation maneuvers in the common femoral, femoral and popliteal veins. COMPARISON:  Report from a prior left lower extremity venous duplex study on 07/20/2019. FINDINGS: Contralateral Common Femoral Vein: Respiratory phasicity is normal and symmetric with the symptomatic side. No evidence of thrombus. Normal compressibility. Common Femoral Vein: No evidence of thrombus. Normal compressibility, respiratory phasicity and response to augmentation. Saphenofemoral Junction: No evidence of thrombus. Normal compressibility and flow on color Doppler imaging. Profunda Femoral Vein: No evidence of thrombus. Normal compressibility and flow on color Doppler imaging. Femoral Vein: No evidence of thrombus. Normal compressibility, respiratory phasicity and response to augmentation. Popliteal Vein: No evidence of thrombus. Normal compressibility, respiratory phasicity and response to augmentation. Calf Veins: No evidence of thrombus. Normal compressibility and flow on color Doppler imaging. Superficial Great Saphenous Vein: No evidence of thrombus. Normal compressibility. Venous Reflux:  None. Other Findings: No evidence of superficial thrombophlebitis or abnormal fluid collection. IMPRESSION: No evidence of right lower  extremity deep venous thrombosis. Electronically Signed   By: Aletta Edouard M.D.   On: 01/26/2022 12:19    Procedures Procedures    Medications Ordered in ED Medications - No data to display  ED Course/ Medical Decision Making/ A&P                           Medical Decision Making Risk Prescription drug management.    [name] is a 56 y.o. male with a past medical history significant for hyperlipidemia, psoriatic arthritis, previous DVT and PE not on anticoagulation now, and previous hip surgeries who presents with calf pain.  According to patient, several hours ago he started having pain in his right calf that was intense and severe.  He reports it worsened when he tried to move it.  He denies any trauma or focal injuries.  He reports that last about 15 minutes for improving.  He then try to walk with a cane and when he started going upstairs it started hurting again.  He denies any chest pain, shortness of breath, or significant leg swelling.  Denies any other preceding symptoms.  He now is symptom-free and denies any swelling or pain but when he called his PCP/hematology team with his previous DVT they told him to come in for evaluation to rule out DVT.  He denies other complaints at this time and reports it was severe initially.  On exam, lungs clear and chest nontender.  Abdomen tender.  Good pulses, strength, and sensation in extremities.  His calf is completely nontender on my exam and he had no knee or popliteal fossa tenderness.  No hip tenderness or thigh tenderness.  Exam otherwise unremarkable.  Clinical aspect he had a muscle cramp in his right calf causing his pain that was intermittent and worsened with walking and now has resolved however given his history of DVT, it is reasonable to rule out clot.  We will obtain DVT ultrasound and if it is negative, dissipate discharge home with suspected muscle spasm and cramp today.  DVT ultrasound was negative.  Suspect muscle  cramp and spasm causing his discomfort given his starting in the gym recently after being more sedentary.  Patient will give prescription for Flexeril and will increase his hydration at home.  We had a shared decision-making conversation and agreed to hold on labs to check electrolytes given his otherwise lack of symptoms.  He had no  questions or concerns and was discharged in good condition with understanding return precautions and follow-up instructions.         Final Clinical Impression(s) / ED Diagnoses Final diagnoses:  Right calf pain  Muscle cramp    Rx / DC Orders ED Discharge Orders          Ordered    cyclobenzaprine (FLEXERIL) 10 MG tablet  2 times daily PRN        01/26/22 1309            Clinical Impression: 1. Right calf pain   2. Muscle cramp     Disposition: Discharge  Condition: Good  I have discussed the results, Dx and Tx plan with the pt(& family if present). He/she/they expressed understanding and agree(s) with the plan. Discharge instructions discussed at great length. Strict return precautions discussed and pt &/or family have verbalized understanding of the instructions. No further questions at time of discharge.    New Prescriptions   CYCLOBENZAPRINE (FLEXERIL) 10 MG TABLET    Take 1 tablet (10 mg total) by mouth 2 (two) times daily as needed for muscle spasms.    Follow Up: your PCP     [facility] [place] [id number] [phone]       Pariss Hommes, Gwenyth Allegra, MD 01/26/22 1316

## 2022-01-27 ENCOUNTER — Other Ambulatory Visit (HOSPITAL_BASED_OUTPATIENT_CLINIC_OR_DEPARTMENT_OTHER): Payer: Self-pay

## 2022-02-14 ENCOUNTER — Ambulatory Visit: Payer: 59 | Admitting: Podiatry

## 2022-02-22 ENCOUNTER — Other Ambulatory Visit (HOSPITAL_BASED_OUTPATIENT_CLINIC_OR_DEPARTMENT_OTHER): Payer: Self-pay

## 2022-02-22 MED ORDER — INFLUENZA VAC SPLIT QUAD 0.5 ML IM SUSY
PREFILLED_SYRINGE | INTRAMUSCULAR | 0 refills | Status: DC
  Filled 2022-02-22: qty 0.5, 1d supply, fill #0

## 2022-04-20 ENCOUNTER — Other Ambulatory Visit (HOSPITAL_BASED_OUTPATIENT_CLINIC_OR_DEPARTMENT_OTHER): Payer: Self-pay

## 2022-04-21 ENCOUNTER — Other Ambulatory Visit (HOSPITAL_BASED_OUTPATIENT_CLINIC_OR_DEPARTMENT_OTHER): Payer: Self-pay

## 2022-05-02 DIAGNOSIS — R69 Illness, unspecified: Secondary | ICD-10-CM | POA: Diagnosis not present

## 2022-05-09 ENCOUNTER — Other Ambulatory Visit (HOSPITAL_BASED_OUTPATIENT_CLINIC_OR_DEPARTMENT_OTHER): Payer: Self-pay

## 2022-05-09 MED ORDER — AMOXICILLIN-POT CLAVULANATE 875-125 MG PO TABS
1.0000 | ORAL_TABLET | Freq: Two times a day (BID) | ORAL | 0 refills | Status: DC
  Filled 2022-05-09: qty 14, 7d supply, fill #0

## 2022-05-12 NOTE — Progress Notes (Signed)
Office Visit Note  Patient: [name]             Date of Birth: [birth year]           MRN: [id number]             PCP: de [place], Raymond J, MD Referring: No ref. provider found Visit Date: 05/26/2022 Occupation: '@GUAROCC'$ @  Subjective:  Right hand numbness  History of Present Illness: [name] is a 57 y.o. male with history of psoriatic arthritis, osteoarthritis and degenerative disc disease.  He states he continues to have numbness in his right hand.  He complains of numbness in his right thumb index and the middle finger.  He has also noticed that the arthritis in his hands is getting worse.  He has not noticed any joint swelling.  He has been off Enbrel since November 2022.  He takes Tylenol as needed for pain.  He has been on [employer] and has been going to the gym on a regular basis and exercising.  He had intentional weight loss and he is pleased with the weight loss.  He denies any history of Achilles tendinitis, Planter fasciitis or uveitis.  He denies any shortness of breath.  He denies any psoriasis lesions.    Activities of Daily Living:  Patient reports morning stiffness for 20  minutes.   Patient Denies nocturnal pain.  Difficulty dressing/grooming: Denies Difficulty climbing stairs: Denies Difficulty getting out of chair: Denies Difficulty using hands for taps, buttons, cutlery, and/or writing: Reports  Review of Systems  Constitutional:  Positive for fatigue.  HENT: Negative.  Negative for mouth sores and mouth dryness.   Eyes: Negative.  Negative for dryness.  Respiratory: Negative.  Negative for shortness of breath.   Cardiovascular: Negative.  Negative for chest pain and palpitations.  Gastrointestinal: Negative.  Negative for blood in stool, constipation and diarrhea.  Endocrine: Negative.  Negative for increased urination.  Genitourinary: Negative.  Negative for involuntary urination.  Musculoskeletal:  Positive for morning stiffness. Negative for  joint pain, gait problem, joint pain, joint swelling, myalgias, muscle weakness, muscle tenderness and myalgias.  Skin: Negative.  Negative for color change, rash, hair loss and sensitivity to sunlight.  Allergic/Immunologic: Negative.  Negative for susceptible to infections.  Neurological: Negative.  Negative for dizziness and headaches.  Hematological: Negative.  Negative for swollen glands.  Psychiatric/Behavioral: Negative.  Negative for depressed mood and sleep disturbance. The patient is not nervous/anxious.     PMFS History:  Patient Active Problem List   Diagnosis Date Noted   Corns and callosities 01/15/2020   Hammer toes, bilateral 01/15/2020   Orthopnea 11/01/2019   History of COVID-19 07/30/2019   Acute deep vein thrombosis (DVT) of left lower extremity ([place]) 07/30/2019   Abnormal CT of the chest 07/30/2019   Cough 07/30/2019   Leg DVT (deep venous thromboembolism), acute, bilateral ([place]) 07/24/2019   HTN (hypertension) 07/24/2019   COVID-19 virus infection 07/20/2019   Acute respiratory failure with hypoxia ([place]) 07/20/2019   Acute pulmonary embolism ([place]) 07/19/2019   Essential tremor 04/03/2017   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   High risk medications (not anticoagulants) long-term use 09/20/2016   Bilateral hand pain 09/20/2016   DDD (degenerative disc disease), cervical 09/20/2016   History of total hip replacement, bilateral 09/20/2016   DDD (degenerative disc disease), lumbar 09/20/2016   Bilateral plantar fasciitis 09/20/2016   Psoriasis 09/20/2016   Primary osteoarthritis of both feet 09/20/2016   Primary osteoarthritis  of both hands 09/20/2016   Primary osteoarthritis of both knees 09/20/2016   Prediabetes 09/22/2014   Hyperlipidemia 09/22/2014   URI (upper respiratory infection) 07/07/2012   Acute bronchitis 07/07/2012   OBESITY 07/03/2008   AVASCULAR NECROSIS, FEMORAL HEAD 07/03/2008   Psoriatic arthritis ([place]) 05/02/2001    Past  Medical History:  Diagnosis Date   Anemia    History of avascular necrosis of capital femoral epiphysis 2006, 2007   Bilateral Femoral Head   HLD (hyperlipidemia)    Pneumonia    twice   Psoriatic arthritis ([place])     Family History  Problem Relation Age of Onset   Pneumonia Father    Tremor Neg Hx    Past Surgical History:  Procedure Laterality Date   JOINT REPLACEMENT Bilateral 2006, 2007   Necrosis femoral head (bilateral)   TOTAL HIP REVISION Right 11/23/2016   Procedure: Acetabulum liner and femoral head revision;  Surgeon: [name], MD;  Location: [facility];  Service: Orthopedics;  Laterality: Right;   TOTAL HIP REVISION Left 04/10/2019   Procedure: Left hip bearing surface vs total hip arthroplasty revision;  Surgeon: [name], MD;  Location: [facility];  Service: Orthopedics;  Laterality: Left;  196mn   Social History   Social History Narrative   Lives at home w/ his wife   Right-handed   Caffeine: 2-3 cups per day   Immunization History  Administered Date(s) Administered   Influenza Inj Mdck Quad Pf 02/01/2019   Influenza,inj,Quad PF,6+ Mos 02/03/2017, 03/21/2018, 01/06/2021   Influenza,inj,quad, With Preservative 02/01/2019   Influenza-Unspecified 02/03/2017, 03/21/2018, 02/01/2019   PFIZER(Purple Top)SARS-COV-2 Vaccination 10/06/2019, 10/27/2019   Pneumococcal Conjugate-13 03/21/2018   Tdap 09/22/2014   Zoster Recombinat (Shingrix) 04/02/2018, 05/23/2018   Zoster, Live 04/02/2018, 05/23/2018     Objective: Vital Signs: BP (!) 145/76 (BP Location: Left Arm, Patient Position: Sitting, Cuff Size: Normal)   Pulse 76   Resp 16   Ht '5\' 9"'$  (1.753 m)   Wt 181 lb 6.4 oz (82.3 kg)   BMI 26.79 kg/m    Physical Exam Vitals and nursing note reviewed.  Constitutional:      Appearance: He is well-developed.  HENT:     Head: Normocephalic and atraumatic.  Eyes:     Conjunctiva/sclera: Conjunctivae normal.     Pupils: Pupils are equal, round, and reactive to  light.  Cardiovascular:     Rate and Rhythm: Normal rate and regular rhythm.     Heart sounds: Normal heart sounds.  Pulmonary:     Effort: Pulmonary effort is normal.     Breath sounds: Normal breath sounds.  Abdominal:     General: Bowel sounds are normal.     Palpations: Abdomen is soft.  Musculoskeletal:     Cervical back: Normal range of motion and neck supple.  Skin:    General: Skin is warm and dry.     Capillary Refill: Capillary refill takes less than 2 seconds.  Neurological:     Mental Status: He is alert and oriented to person, place, and time.  Psychiatric:        Behavior: Behavior normal.      Musculoskeletal Exam: He had limited lateral rotation of the cervical spine.  He had limited extension and flexion of the cervical spine.  He had thoracic kyphosis.  He had no tenderness over thoracic or lumbar spine.  He had no SI joint tenderness.  Shoulder joints were in good range of motion.  He had bilateral elbow joint contractures  with no synovitis.  Bilateral PIP and DIP thickening with the limited extension of his PIP joints especially his right third PIP joint.  No synovitis was noted.  Hip joints were replaced and were in good range of motion without discomfort.  Knee joints in good range of motion without any warmth swelling or effusion.  There was no tenderness over ankles or MTPs.  No Achilles tendinitis of Planter fasciitis was noted.  CDAI Exam: CDAI Score: -- Patient Global: --; Provider Global: -- Swollen: --; Tender: -- Joint Exam 05/26/2022   No joint exam has been documented for this visit   There is currently no information documented on the homunculus. Go to the Rheumatology activity and complete the homunculus joint exam.  Investigation: No additional findings.  Imaging: No results found.  Recent Labs: Lab Results  Component Value Date   WBC 5.0 02/17/2021   HGB 16.7 02/17/2021   PLT 140 02/17/2021   NA 141 02/17/2021   K 5.0 02/17/2021   CL  104 02/17/2021   CO2 27 02/17/2021   GLUCOSE 83 02/17/2021   BUN 20 02/17/2021   CREATININE 1.02 02/17/2021   BILITOT 0.7 02/17/2021   ALKPHOS 97 07/30/2019   AST 26 02/17/2021   ALT 26 02/17/2021   PROT 7.6 02/17/2021   ALBUMIN 4.6 07/30/2019   CALCIUM 9.9 02/17/2021   GFRAA 94 09/16/2020   QFTBGOLDPLUS NEGATIVE 02/17/2021    Speciality Comments: No specialty comments available.  Procedures:  No procedures performed Allergies: Gold-containing drug products   Assessment / Plan:     Visit Diagnoses: Psoriatic arthritis (HCC)-patient had no synovitis on examination.  He denies any history of psoriatic arthritis flares.  He has been off Enbrel since November 2022.  He came off Enbrel due to recurrent upper respiratory tract infections.  He has not had a flare of psoriasis or psoriatic arthritis in a long time.  He states he has been working out and going to the gym on a regular basis.  He has been also on paleo diet which has been helpful for him.  He had intentional significant weight loss.  He continues to have some stiffness and discomfort in his joints but no swelling.  He denies any history of dactylitis, Planter fasciitis or Achilles tendinitis.  There is no history of uveitis.  Advised him to contact us if he develops any joint swelling.  Psoriasis-no active psoriasis lesions were noted.  High risk medication use - (Enbrel 50 mg sq injections every 7 days. -Discontinued November 2022.)  He does not require immunosuppressive therapy at this time.  Primary osteoarthritis of both hands-he had bilateral PIP and DIP thickening.  He had limited extension of the right third PIP joint.  Joint protection muscle strengthening was discussed.  Right hand paresthesia-he has been experiencing paresthesias of the right hand.  He had loss of thenar eminence in his right hand.  I will schedule nerve conduction velocities to rule out carpal tunnel syndrome.  He may need carpal tunnel release based  on the findings.  History of total hip replacement, bilateral-he had good range of motion without discomfort.  Primary osteoarthritis of both knees-good range of motion without any warmth swelling or effusion.  Primary osteoarthritis of both feet -he has hammertoes.  He  is followed by Dr. Prudence Davidson at [facility].   DDD (degenerative disc disease), cervical-he has limited lateral rotation flexion and extension.  Stretching exercises were demonstrated in the office today.  DDD (degenerative disc disease),  lumbar-he had no tenderness.  He denies any discomfort today.  Further medical problems are listed as follows:  History of hypertension-blood pressure was elevated at 145/76.  He is advised to monitor blood pressure closely and follow-up with his PCP.  History of hyperlipidemia  History of prediabetes  History of pulmonary embolism - After COVID-19 infection March 2021. He is no longer taking eliquis.  History of COVID-19 - March 2021.  Patient states he has had chronic cough since he had COVID-19 infection.  Other fatigue  History of vitamin D deficiency  Orders: Orders Placed This Encounter  Procedures   Ambulatory referral to Physical Medicine Rehab   No orders of the defined types were placed in this encounter.    Follow-Up Instructions: Return in about 6 months (around 11/24/2022) for Psoriatic arthritis, Osteoarthritis.   Bo Merino, MD  Note - This record has been created using Editor, commissioning.  Chart creation errors have been sought, but may not always  have been located. Such creation errors do not reflect on  the standard of medical care.

## 2022-05-13 ENCOUNTER — Other Ambulatory Visit (HOSPITAL_BASED_OUTPATIENT_CLINIC_OR_DEPARTMENT_OTHER): Payer: Self-pay

## 2022-05-18 ENCOUNTER — Encounter (HOSPITAL_BASED_OUTPATIENT_CLINIC_OR_DEPARTMENT_OTHER): Payer: Self-pay | Admitting: Family Medicine

## 2022-05-18 ENCOUNTER — Ambulatory Visit (INDEPENDENT_AMBULATORY_CARE_PROVIDER_SITE_OTHER): Payer: Medicare HMO | Admitting: Family Medicine

## 2022-05-18 VITALS — BP 154/85 | HR 61 | Ht 69.0 in | Wt 181.4 lb

## 2022-05-18 DIAGNOSIS — Z0000 Encounter for general adult medical examination without abnormal findings: Secondary | ICD-10-CM

## 2022-05-18 DIAGNOSIS — G250 Essential tremor: Secondary | ICD-10-CM | POA: Diagnosis not present

## 2022-05-18 NOTE — Assessment & Plan Note (Signed)
Reported symptoms have generally remained stable since prior evaluation with neurology issues since that time.  Given that this is remained stable, can continue with monitor.  Should any changes occur, consider reevaluation with neurology

## 2022-05-18 NOTE — Progress Notes (Signed)
New Patient Office Visit  Subjective    Patient ID: [name], male    DOB: [birth year]  Age: 58 y.o. MRN: [id number]  CC:  Chief Complaint  Patient presents with   New Patient (Initial Visit)    HPI [name] presents to establish care Last PCP - Dr. Ethlyn Gallery, last visit about 1.5 years ago  Follows with Rheumatology, Dr. Estanislado Pandy, related to psoriatic arthritis.  At 1 point he was utilizing Enbrel, not currently on any immunosuppressive agents.  He does continue with OTC medications to help with any joint pains.  Tremor: Reports history of tremor, did have prior evaluation with neurology, last visit was 3 to 5 years ago.  Was diagnosed at that time with essential tremor with recommendations for monitoring moving forward.  He continues to have intermittent tremor, does not feel that any significant changes have occurred in regards to recurrence or severity.  No new systemic symptoms reported.  Outpatient Encounter Medications as of 05/18/2022  Medication Sig   acetaminophen (TYLENOL) 500 MG tablet Take 1,000 mg by mouth every 8 (eight) hours as needed for mild pain or moderate pain.    cholecalciferol (VITAMIN D) 1000 units tablet Take 2,000 Units by mouth daily.    Coenzyme Q10 (CO Q 10 PO) Take 600 mg by mouth daily.   cyclobenzaprine (FLEXERIL) 10 MG tablet Take 1 tablet (10 mg total) by mouth 2 (two) times daily as needed for muscle spasms.   fluticasone (FLONASE) 50 MCG/ACT nasal spray Place into both nostrils daily.   Glutathione POWD 500 g by Does not apply route daily.   ibuprofen (ADVIL) 200 MG tablet Take 200 mg by mouth as needed.   influenza vac split quadrivalent PF (FLUARIX) 0.5 ML injection Inject into the muscle.   KRILL OIL PO Take 500 mg by mouth daily.   Magnesium 250 MG TABS Take 250 mg by mouth daily.   Melatonin 5 MG CAPS as needed.   Multiple Vitamins-Minerals (IMMUNE SUPPORT PO) Take 1,000 mg by mouth daily.   mupirocin ointment (BACTROBAN) 2 %  Apply 1 application topically 3 (three) times daily.   promethazine-dextromethorphan (PROMETHAZINE-DM) 6.25-15 MG/5ML syrup Take 5 mLs by mouth 4 (four)  times daily as needed for up to 7 days for Cough.   TURMERIC PO Take 1,000 mg by mouth daily.   [DISCONTINUED] amoxicillin (AMOXIL) 500 MG capsule Take 4 capsules by mouth 1 hour prior to dental appointment. (Patient not taking: Reported on 12/23/2021)   [DISCONTINUED] amoxicillin (AMOXIL) 500 MG capsule 2 STAT THEN 1 TID UNTIL GONE. (Patient not taking: Reported on 12/23/2021)   [DISCONTINUED] amoxicillin (AMOXIL) 500 MG capsule Take 1 capsule by mouth four times daily until all gone. (Patient not taking: Reported on 12/23/2021)   [DISCONTINUED] amoxicillin-clavulanate (AUGMENTIN) 875-125 MG tablet Take 1 tablet by mouth 2 (two) times daily.   [DISCONTINUED] amoxicillin-clavulanate (AUGMENTIN) 875-125 MG tablet TAKE 1 TABLET BY ORAL ROUTE 2 TIMES DAILY UNTIL ALL GONE   [DISCONTINUED] cetirizine (ZYRTEC) 10 MG tablet Take 1 tablet (10 mg total) by mouth daily.   [DISCONTINUED] Insulin Pen Needle 31G X 6 MM MISC Use as directed within package instructions   [DISCONTINUED] oseltamivir (TAMIFLU) 75 MG capsule Take 1 capsule (75 mg total) by mouth 2 (two) times daily.   No facility-administered encounter medications on file as of 05/18/2022.    Past Medical History:  Diagnosis Date   Anemia    History of avascular necrosis of capital femoral epiphysis 2006, 2007  Bilateral Femoral Head   HLD (hyperlipidemia)    Pneumonia    twice   Psoriatic arthritis [facility])     Past Surgical History:  Procedure Laterality Date   JOINT REPLACEMENT Bilateral 2006, 2007   Necrosis femoral head (bilateral)   TOTAL HIP REVISION Right 11/23/2016   Procedure: Acetabulum liner and femoral head revision;  Surgeon: [name], MD;  Location: [facility];  Service: Orthopedics;  Laterality: Right;   TOTAL HIP REVISION Left 04/10/2019   Procedure: Left hip bearing  surface vs total hip arthroplasty revision;  Surgeon: [name], MD;  Location: [facility];  Service: Orthopedics;  Laterality: Left;  169mn    Family History  Problem Relation Age of Onset   Pneumonia Father    Tremor Neg Hx     Social History   Socioeconomic History   Marital status: Married    Spouse name: Not on file   Number of children: 1   Years of education: Not on file   Highest education level: Not on file  Occupational History   Occupation: Disabled  Tobacco Use   Smoking status: Never    Passive exposure: Never   Smokeless tobacco: Never  Vaping Use   Vaping Use: Never used  Substance and Sexual Activity   Alcohol use: Yes    Comment: 1 drinks per week   Drug use: No   Sexual activity: Not on file    Comment: Married  Other Topics Concern   Not on file  Social History Narrative   Lives at home w/ his wife   Right-handed   Caffeine: 2-3 cups per day   Social Determinants of Health   Financial Resource Strain: Not on file  Food Insecurity: Not on file  Transportation Needs: Not on file  Physical Activity: Not on file  Stress: Not on file  Social Connections: Not on file  Intimate Partner Violence: Not on file    Objective    BP (!) 154/85 (BP Location: Right Arm, Patient Position: Sitting, Cuff Size: Large)   Pulse 61   Ht '5\' 9"'$  (1.753 m)   Wt 181 lb 6.4 oz (82.3 kg)   SpO2 100%   BMI 26.79 kg/m   Physical Exam  57year old male in no acute distress Cardiovascular exam with regular rate and rhythm Lungs clear to auscultation bilaterally  Assessment & Plan:   Problem List Items Addressed This Visit       Nervous and Auditory   Essential tremor - Primary    Reported symptoms have generally remained stable since prior evaluation with neurology issues since that time.  Given that this is remained stable, can continue with monitor.  Should any changes occur, consider reevaluation with neurology       Return in about 3 months (around  08/17/2022).   Davionne Mastrangelo J [place] MD

## 2022-05-26 ENCOUNTER — Ambulatory Visit: Payer: Medicare HMO | Attending: Rheumatology | Admitting: Rheumatology

## 2022-05-26 ENCOUNTER — Encounter: Payer: Self-pay | Admitting: Rheumatology

## 2022-05-26 VITALS — BP 145/76 | HR 76 | Resp 16 | Ht 69.0 in | Wt 181.4 lb

## 2022-05-26 DIAGNOSIS — Z8679 Personal history of other diseases of the circulatory system: Secondary | ICD-10-CM

## 2022-05-26 DIAGNOSIS — M19042 Primary osteoarthritis, left hand: Secondary | ICD-10-CM

## 2022-05-26 DIAGNOSIS — M19072 Primary osteoarthritis, left ankle and foot: Secondary | ICD-10-CM

## 2022-05-26 DIAGNOSIS — R202 Paresthesia of skin: Secondary | ICD-10-CM

## 2022-05-26 DIAGNOSIS — Z96643 Presence of artificial hip joint, bilateral: Secondary | ICD-10-CM

## 2022-05-26 DIAGNOSIS — Z79899 Other long term (current) drug therapy: Secondary | ICD-10-CM

## 2022-05-26 DIAGNOSIS — Z8616 Personal history of COVID-19: Secondary | ICD-10-CM

## 2022-05-26 DIAGNOSIS — Z86711 Personal history of pulmonary embolism: Secondary | ICD-10-CM

## 2022-05-26 DIAGNOSIS — M5030 Other cervical disc degeneration, unspecified cervical region: Secondary | ICD-10-CM

## 2022-05-26 DIAGNOSIS — Z8639 Personal history of other endocrine, nutritional and metabolic disease: Secondary | ICD-10-CM | POA: Diagnosis not present

## 2022-05-26 DIAGNOSIS — Z87898 Personal history of other specified conditions: Secondary | ICD-10-CM

## 2022-05-26 DIAGNOSIS — L4050 Arthropathic psoriasis, unspecified: Secondary | ICD-10-CM

## 2022-05-26 DIAGNOSIS — M5136 Other intervertebral disc degeneration, lumbar region: Secondary | ICD-10-CM

## 2022-05-26 DIAGNOSIS — M170 Bilateral primary osteoarthritis of knee: Secondary | ICD-10-CM | POA: Diagnosis not present

## 2022-05-26 DIAGNOSIS — M19071 Primary osteoarthritis, right ankle and foot: Secondary | ICD-10-CM | POA: Diagnosis not present

## 2022-05-26 DIAGNOSIS — L409 Psoriasis, unspecified: Secondary | ICD-10-CM

## 2022-05-26 DIAGNOSIS — R5383 Other fatigue: Secondary | ICD-10-CM

## 2022-05-26 DIAGNOSIS — M19041 Primary osteoarthritis, right hand: Secondary | ICD-10-CM

## 2022-05-30 ENCOUNTER — Telehealth: Payer: Self-pay | Admitting: Physical Medicine and Rehabilitation

## 2022-05-30 NOTE — Telephone Encounter (Signed)
Spoke with patient and scheduled NCV for 06/07/22

## 2022-05-30 NOTE — Telephone Encounter (Signed)
Patient states he missed a call from [place]. For an appointment.

## 2022-06-02 DIAGNOSIS — R69 Illness, unspecified: Secondary | ICD-10-CM | POA: Diagnosis not present

## 2022-06-07 ENCOUNTER — Encounter: Payer: Self-pay | Admitting: Physical Medicine and Rehabilitation

## 2022-06-07 ENCOUNTER — Ambulatory Visit (INDEPENDENT_AMBULATORY_CARE_PROVIDER_SITE_OTHER): Payer: Medicare HMO | Admitting: Physical Medicine and Rehabilitation

## 2022-06-07 DIAGNOSIS — M542 Cervicalgia: Secondary | ICD-10-CM

## 2022-06-07 DIAGNOSIS — R202 Paresthesia of skin: Secondary | ICD-10-CM

## 2022-06-07 DIAGNOSIS — M79641 Pain in right hand: Secondary | ICD-10-CM

## 2022-06-07 DIAGNOSIS — R531 Weakness: Secondary | ICD-10-CM | POA: Diagnosis not present

## 2022-06-07 NOTE — Progress Notes (Signed)
Functional Pain Scale - descriptive words and definitions  Unmanageable (7)  Pain interferes with normal ADL's/nothing seems to help/sleep is very difficult/active distractions are very difficult to concentrate on. Severe range order  Average Pain 5    Right hand numbness for months and getting worse

## 2022-06-10 ENCOUNTER — Other Ambulatory Visit: Payer: Self-pay | Admitting: *Deleted

## 2022-06-10 ENCOUNTER — Encounter (HOSPITAL_BASED_OUTPATIENT_CLINIC_OR_DEPARTMENT_OTHER): Payer: Self-pay

## 2022-06-10 ENCOUNTER — Other Ambulatory Visit (HOSPITAL_BASED_OUTPATIENT_CLINIC_OR_DEPARTMENT_OTHER): Payer: Self-pay

## 2022-06-10 ENCOUNTER — Telehealth: Payer: Self-pay | Admitting: *Deleted

## 2022-06-10 DIAGNOSIS — L02222 Furuncle of back [any part, except buttock]: Secondary | ICD-10-CM | POA: Diagnosis not present

## 2022-06-10 DIAGNOSIS — D225 Melanocytic nevi of trunk: Secondary | ICD-10-CM | POA: Diagnosis not present

## 2022-06-10 DIAGNOSIS — L280 Lichen simplex chronicus: Secondary | ICD-10-CM | POA: Diagnosis not present

## 2022-06-10 DIAGNOSIS — L821 Other seborrheic keratosis: Secondary | ICD-10-CM | POA: Diagnosis not present

## 2022-06-10 DIAGNOSIS — L814 Other melanin hyperpigmentation: Secondary | ICD-10-CM | POA: Diagnosis not present

## 2022-06-10 DIAGNOSIS — L728 Other follicular cysts of the skin and subcutaneous tissue: Secondary | ICD-10-CM | POA: Diagnosis not present

## 2022-06-10 DIAGNOSIS — R202 Paresthesia of skin: Secondary | ICD-10-CM

## 2022-06-10 MED ORDER — TRIAMCINOLONE ACETONIDE 0.1 % EX CREA
1.0000 | TOPICAL_CREAM | Freq: Every day | CUTANEOUS | 2 refills | Status: DC
  Filled 2022-06-10: qty 30, 30d supply, fill #0

## 2022-06-10 MED ORDER — CLINDAMYCIN PHOSPHATE 1 % EX LOTN
1.0000 | TOPICAL_LOTION | Freq: Two times a day (BID) | CUTANEOUS | 2 refills | Status: DC
  Filled 2022-06-10: qty 60, 30d supply, fill #0

## 2022-06-10 NOTE — Telephone Encounter (Signed)
I called patient, patient wants to go to [facility].

## 2022-06-10 NOTE — Telephone Encounter (Signed)
-----   Message from Bo Merino, MD sent at 06/09/2022  8:44 AM EST ----- Nerve conduction velocities consistent with very severe right carpal tunnel syndrome.  Please 5 patient and refer him to Dr. Erlinda Hong for carpal tunnel release as soon as possible. Bo Merino, MD  ----- Message ----- From: Magnus Sinning, MD Sent: 06/08/2022   7:28 AM EST To: Bo Merino, MD  I saw [name] yesterday for electrodiagnostic study and he has very severe median neuropathy at the right wrist or carpal tunnel syndrome.  Please refer him to a surgeon for consultation on decompression.  He will likely have some residual symptoms even after decompression but he did have active motor units and should regain some strength after decompression and allowing the nerve to re-myelinate and re-innervation of motor units. Report to follow.

## 2022-06-10 NOTE — Telephone Encounter (Signed)
Attempted to contact the patient and left message for patient to call the office.  

## 2022-06-13 NOTE — Procedures (Unsigned)
EMG & NCV Findings: Evaluation of the right median motor nerve showed prolonged distal onset latency (10.2 ms), reduced amplitude (0.6 mV), and decreased conduction velocity (Elbow-Wrist, 30 m/s).  The right median (across palm) sensory nerve showed no response (Wrist) and prolonged distal peak latency (Palm, 8.0 ms).  The right ulnar sensory nerve showed prolonged distal peak latency (4.0 ms) and decreased conduction velocity (Wrist-5th Digit, 35 m/s).  All remaining nerves (as indicated in the following tables) were within normal limits.    Needle evaluation of the right abductor pollicis brevis muscle showed increased insertional activity, widespread spontaneous activity, and diminished recruitment.  All remaining muscles (as indicated in the following table) showed no evidence of electrical instability.    Impression: The above electrodiagnostic study is ABNORMAL and reveals evidence of a severe right median nerve entrapment at the wrist (carpal tunnel syndrome) affecting sensory and motor components. The lesion is characterized by sensory and motor demyelination with evidence of significant axonal injury.   There is no significant electrodiagnostic evidence of any other focal nerve entrapment, brachial plexopathy or cervical radiculopathy.   Recommendations: 1.  Follow-up with referring physician. 2.  Continue current management of symptoms. 3.  Suggest surgical evaluation.  ___________________________ [name] Spates FAAPMR Board Certified, American Board of Physical Medicine and Rehabilitation    Nerve Conduction Studies Anti Sensory Summary Table   Stim Site NR Peak (ms) Norm Peak (ms) P-T Amp (V) Norm P-T Amp Site1 Site2 Delta-P (ms) Dist (cm) Vel (m/s) Norm Vel (m/s)  Right Median Acr Palm Anti Sensory (2nd Digit)  29.8C  Wrist *NR  <3.6  >10 Wrist Palm  0.0    Palm    *8.0 <2.0 2.7         Right Radial Anti Sensory (Base 1st Digit)  29C  Wrist    2.6 <3.1 31.8  Wrist Base 1st  Digit 2.6 0.0    Right Ulnar Anti Sensory (5th Digit)  29.5C  Wrist    *4.0 <3.7 31.2 >15.0 Wrist 5th Digit 4.0 14.0 *35 >38   Motor Summary Table   Stim Site NR Onset (ms) Norm Onset (ms) O-P Amp (mV) Norm O-P Amp Site1 Site2 Delta-0 (ms) Dist (cm) Vel (m/s) Norm Vel (m/s)  Right Median Motor (Abd Poll Brev)  29C  Wrist    *10.2 <4.2 *0.6 >5 Elbow Wrist 7.1 21.0 *30 >50  Elbow    17.3  0.3  Axilla Elbow 3.5 0.0    Axilla    13.8  0.0         Right Ulnar Motor (Abd Dig Min)  29C  Wrist    3.0 <4.2 8.4 >3 B Elbow Wrist 3.6 21.0 58 >53  B Elbow    6.6  7.9  A Elbow B Elbow 1.7 11.0 65 >53  A Elbow    8.3  8.0          EMG   Side Muscle Nerve Root Ins Act Fibs Psw Amp Dur Poly Recrt Int Fraser Din Comment  Right Abd Poll Brev Median C8-T1 *Incr *4+ *4+ Nml Nml 0 *Reduced Nml   Right 1stDorInt Ulnar C8-T1 Nml Nml Nml Nml Nml 0 Nml Nml   Right PronatorTeres Median C6-7 Nml Nml Nml Nml Nml 0 Nml Nml   Right Biceps Musculocut C5-6 Nml Nml Nml Nml Nml 0 Nml Nml   Right Deltoid Axillary C5-6 Nml Nml Nml Nml Nml 0 Nml Nml     Nerve Conduction Studies Anti Sensory Left/Right Comparison  Stim Site L Lat (ms) R Lat (ms) L-R Lat (ms) L Amp (V) R Amp (V) L-R Amp (%) Site1 Site2 L Vel (m/s) R Vel (m/s) L-R Vel (m/s)  Median Acr Palm Anti Sensory (2nd Digit)  29.8C  Wrist       Wrist Palm     Palm  *8.0   2.7        Radial Anti Sensory (Base 1st Digit)  29C  Wrist  2.6   31.8  Wrist Base 1st Digit     Ulnar Anti Sensory (5th Digit)  29.5C  Wrist  *4.0   31.2  Wrist 5th Digit  *35    Motor Left/Right Comparison   Stim Site L Lat (ms) R Lat (ms) L-R Lat (ms) L Amp (mV) R Amp (mV) L-R Amp (%) Site1 Site2 L Vel (m/s) R Vel (m/s) L-R Vel (m/s)  Median Motor (Abd Poll Brev)  29C  Wrist  *10.2   *0.6  Elbow Wrist  *30   Elbow  17.3   0.3  Axilla Elbow     Axilla  13.8   0.0        Ulnar Motor (Abd Dig Min)  29C  Wrist  3.0   8.4  B Elbow Wrist  58   B Elbow  6.6   7.9  A Elbow B Elbow  65    A Elbow  8.3   8.0           Waveforms:

## 2022-06-14 ENCOUNTER — Other Ambulatory Visit (HOSPITAL_BASED_OUTPATIENT_CLINIC_OR_DEPARTMENT_OTHER): Payer: Self-pay

## 2022-06-14 NOTE — Progress Notes (Signed)
[name] - 57 y.o. male MRN [id number]  Date of birth: [birth year]  Office Visit Note: Visit Date: 06/07/2022 PCP: Tennis Must [place], Blondell Reveal, MD Referred by: Bo Merino, MD  Subjective: Chief Complaint  Patient presents with   Right Hand - Numbness   HPI: [name] is a 57 y.o. male who comes in today at the request of Dr. Bo Merino for evaluation and management of chronic, worsening and severe pain, numbness and tingling in the Right upper extremities.  Patient is Right hand dominant.  He reports symptoms worsening over the last several months but also reports chronic pain in the hands for many years.  He does report some joint pains bilaterally in both hands.  His worsening numbness however is in the right hand and the index finger and thumb and sometimes the middle digit.  Never gets any complaint of the little finger.  He does endorse some neck pain.  Some arm pain no frank symptoms shooting down the arm.  Does have a history of essential tremor.  His case is complicated by psoriatic arthritis and osteoarthritis.  He has had some cervical x-ray imaging showing degenerative changes throughout.  No history of cervical surgery.  He is not diabetic.  He has had some weight loss recently and has been trying to do that as they are pretty happy with the weight loss.  He stays active and exercises.  He does have a complicated history of bilateral hip replacements for AVN with also redo hip replacement.  He has tried and failed medication management including Enbrel for his psoriatic arthritis.      Review of Systems  Musculoskeletal:  Positive for back pain, joint pain and neck pain.  Neurological:  Positive for tingling and tremors.  All other systems reviewed and are negative.  Otherwise per HPI.  Assessment & Plan: Visit Diagnoses:    ICD-10-CM   1. Paresthesia of skin  R20.2 NCV with EMG (electromyography)    2. Pain in right hand  M79.641     3. Weakness  R53.1      4. Cervicalgia  M54.2        Plan: Impression: Complicated right hand pain most consistent with median neuropathy at the wrist but also likely osteoarthritic pain as well as some overuse syndrome.  Less likely radicular pain.  Electrodiagnostic study performed.  The above electrodiagnostic study is ABNORMAL and reveals evidence of a severe right median nerve entrapment at the wrist (carpal tunnel syndrome) affecting sensory and motor components. The lesion is characterized by sensory and motor demyelination with evidence of significant axonal injury.   There is no significant electrodiagnostic evidence of any other focal nerve entrapment, brachial plexopathy or cervical radiculopathy.   Recommendations: 1.  Follow-up with referring physician. 2.  Continue current management of symptoms. 3.  Suggest surgical evaluation.  Meds & Orders: No orders of the defined types were placed in this encounter.   Orders Placed This Encounter  Procedures   NCV with EMG (electromyography)    Follow-up: Return for Cy Blamer, MD.   Procedures: No procedures performed  EMG & NCV Findings: Evaluation of the right median motor nerve showed prolonged distal onset latency (10.2 ms), reduced amplitude (0.6 mV), and decreased conduction velocity (Elbow-Wrist, 30 m/s).  The right median (across palm) sensory nerve showed no response (Wrist) and prolonged distal peak latency (Palm, 8.0 ms).  The right ulnar sensory nerve showed prolonged distal peak latency (4.0 ms) and decreased conduction velocity (  Wrist-5th Digit, 35 m/s).  All remaining nerves (as indicated in the following tables) were within normal limits.    Needle evaluation of the right abductor pollicis brevis muscle showed increased insertional activity, widespread spontaneous activity, and diminished recruitment.  All remaining muscles (as indicated in the following table) showed no evidence of electrical instability.    Impression: The above  electrodiagnostic study is ABNORMAL and reveals evidence of a severe right median nerve entrapment at the wrist (carpal tunnel syndrome) affecting sensory and motor components. The lesion is characterized by sensory and motor demyelination with evidence of significant axonal injury.   There is no significant electrodiagnostic evidence of any other focal nerve entrapment, brachial plexopathy or cervical radiculopathy.   Recommendations: 1.  Follow-up with referring physician. 2.  Continue current management of symptoms. 3.  Suggest surgical evaluation.  ___________________________ [name] Spates FAAPMR Board Certified, American Board of Physical Medicine and Rehabilitation    Nerve Conduction Studies Anti Sensory Summary Table   Stim Site NR Peak (ms) Norm Peak (ms) P-T Amp (V) Norm P-T Amp Site1 Site2 Delta-P (ms) Dist (cm) Vel (m/s) Norm Vel (m/s)  Right Median Acr Palm Anti Sensory (2nd Digit)  29.8C  Wrist *NR  <3.6  >10 Wrist Palm  0.0    Palm    *8.0 <2.0 2.7         Right Radial Anti Sensory (Base 1st Digit)  29C  Wrist    2.6 <3.1 31.8  Wrist Base 1st Digit 2.6 0.0    Right Ulnar Anti Sensory (5th Digit)  29.5C  Wrist    *4.0 <3.7 31.2 >15.0 Wrist 5th Digit 4.0 14.0 *35 >38   Motor Summary Table   Stim Site NR Onset (ms) Norm Onset (ms) O-P Amp (mV) Norm O-P Amp Site1 Site2 Delta-0 (ms) Dist (cm) Vel (m/s) Norm Vel (m/s)  Right Median Motor (Abd Poll Brev)  29C  Wrist    *10.2 <4.2 *0.6 >5 Elbow Wrist 7.1 21.0 *30 >50  Elbow    17.3  0.3  Axilla Elbow 3.5 0.0    Axilla    13.8  0.0         Right Ulnar Motor (Abd Dig Min)  29C  Wrist    3.0 <4.2 8.4 >3 B Elbow Wrist 3.6 21.0 58 >53  B Elbow    6.6  7.9  A Elbow B Elbow 1.7 11.0 65 >53  A Elbow    8.3  8.0          EMG   Side Muscle Nerve Root Ins Act Fibs Psw Amp Dur Poly Recrt Int Fraser Din Comment  Right Abd Poll Brev Median C8-T1 *Incr *4+ *4+ Nml Nml 0 *Reduced Nml   Right 1stDorInt Ulnar C8-T1 Nml Nml Nml Nml Nml 0  Nml Nml   Right PronatorTeres Median C6-7 Nml Nml Nml Nml Nml 0 Nml Nml   Right Biceps Musculocut C5-6 Nml Nml Nml Nml Nml 0 Nml Nml   Right Deltoid Axillary C5-6 Nml Nml Nml Nml Nml 0 Nml Nml     Nerve Conduction Studies Anti Sensory Left/Right Comparison   Stim Site L Lat (ms) R Lat (ms) L-R Lat (ms) L Amp (V) R Amp (V) L-R Amp (%) Site1 Site2 L Vel (m/s) R Vel (m/s) L-R Vel (m/s)  Median Acr Palm Anti Sensory (2nd Digit)  29.8C  Wrist       Wrist Palm     Palm  *8.0   2.7  Radial Anti Sensory (Base 1st Digit)  29C  Wrist  2.6   31.8  Wrist Base 1st Digit     Ulnar Anti Sensory (5th Digit)  29.5C  Wrist  *4.0   31.2  Wrist 5th Digit  *35    Motor Left/Right Comparison   Stim Site L Lat (ms) R Lat (ms) L-R Lat (ms) L Amp (mV) R Amp (mV) L-R Amp (%) Site1 Site2 L Vel (m/s) R Vel (m/s) L-R Vel (m/s)  Median Motor (Abd Poll Brev)  29C  Wrist  *10.2   *0.6  Elbow Wrist  *30   Elbow  17.3   0.3  Axilla Elbow     Axilla  13.8   0.0        Ulnar Motor (Abd Dig Min)  29C  Wrist  3.0   8.4  B Elbow Wrist  58   B Elbow  6.6   7.9  A Elbow B Elbow  65   A Elbow  8.3   8.0           Waveforms:             Clinical History: No specialty comments available.   He reports that he has never smoked. He has never been exposed to tobacco smoke. He has never used smokeless tobacco. No results for input(s): "HGBA1C", "LABURIC" in the last 8760 hours.  Objective:  VS:  HT:    WT:   BMI:     BP:   HR: bpm  TEMP: ( )  RESP:  Physical Exam Vitals and nursing note reviewed.  Constitutional:      General: He is not in acute distress.    Appearance: Normal appearance. He is well-developed.  HENT:     Head: Normocephalic and atraumatic.  Eyes:     Conjunctiva/sclera: Conjunctivae normal.     Pupils: Pupils are equal, round, and reactive to light.  Cardiovascular:     Rate and Rhythm: Normal rate.     Pulses: Normal pulses.     Heart sounds: Normal heart sounds.   Pulmonary:     Effort: Pulmonary effort is normal. No respiratory distress.  Musculoskeletal:        General: No tenderness.     Cervical back: Normal range of motion and neck supple. No rigidity.     Right lower leg: No edema.     Left lower leg: No edema.     Comments: Inspection reveals mild atrophy of the right APB but no atrophy of the bilateral APB or FDI or hand intrinsics. There is no swelling, color changes, allodynia or dystrophic changes. There is 5 out of 5 strength in the bilateral wrist extension, finger abduction and long finger flexion. There is impaired sensation on the right median nerve distribution. There is a negative Froment's test bilaterally. There is a negative Tinel's test at the bilateral wrist and elbow. There is a positive Phalen's test bilaterally. There is a negative Hoffmann's test bilaterally.  Skin:    General: Skin is warm and dry.     Findings: No erythema or rash.  Neurological:     General: No focal deficit present.     Mental Status: He is alert and oriented to person, place, and time.     Sensory: No sensory deficit.     Motor: No weakness or abnormal muscle tone.     Coordination: Coordination normal.     Gait: Gait normal.  Psychiatric:  Mood and Affect: Mood normal.        Behavior: Behavior normal.        Thought Content: Thought content normal.     Ortho Exam  Imaging: No results found.  Past Medical/Family/Surgical/Social History: Medications & Allergies reviewed per EMR, new medications updated. Patient Active Problem List   Diagnosis Date Noted   Corns and callosities 01/15/2020   Hammer toes, bilateral 01/15/2020   Orthopnea 11/01/2019   History of COVID-19 07/30/2019   Acute deep vein thrombosis (DVT) of left lower extremity ([place]) 07/30/2019   Abnormal CT of the chest 07/30/2019   Cough 07/30/2019   Leg DVT (deep venous thromboembolism), acute, bilateral ([place]) 07/24/2019   HTN (hypertension) 07/24/2019   COVID-19  virus infection 07/20/2019   Acute respiratory failure with hypoxia ([place]) 07/20/2019   Acute pulmonary embolism ([place]) 07/19/2019   Essential tremor 04/03/2017   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   High risk medications (not anticoagulants) long-term use 09/20/2016   Bilateral hand pain 09/20/2016   DDD (degenerative disc disease), cervical 09/20/2016   History of total hip replacement, bilateral 09/20/2016   DDD (degenerative disc disease), lumbar 09/20/2016   Bilateral plantar fasciitis 09/20/2016   Psoriasis 09/20/2016   Primary osteoarthritis of both feet 09/20/2016   Primary osteoarthritis of both hands 09/20/2016   Primary osteoarthritis of both knees 09/20/2016   Prediabetes 09/22/2014   Hyperlipidemia 09/22/2014   URI (upper respiratory infection) 07/07/2012   Acute bronchitis 07/07/2012   OBESITY 07/03/2008   AVASCULAR NECROSIS, FEMORAL HEAD 07/03/2008   Psoriatic arthritis ([place]) 05/02/2001   Past Medical History:  Diagnosis Date   Anemia    History of avascular necrosis of capital femoral epiphysis 2006, 2007   Bilateral Femoral Head   HLD (hyperlipidemia)    Pneumonia    twice   Psoriatic arthritis ([place])    Family History  Problem Relation Age of Onset   Pneumonia Father    Tremor Neg Hx    Past Surgical History:  Procedure Laterality Date   JOINT REPLACEMENT Bilateral 2006, 2007   Necrosis femoral head (bilateral)   TOTAL HIP REVISION Right 11/23/2016   Procedure: Acetabulum liner and femoral head revision;  Surgeon: [name], MD;  Location: [facility];  Service: Orthopedics;  Laterality: Right;   TOTAL HIP REVISION Left 04/10/2019   Procedure: Left hip bearing surface vs total hip arthroplasty revision;  Surgeon: [name], MD;  Location: [facility];  Service: Orthopedics;  Laterality: Left;  1100mn   Social History   Occupational History   Occupation: Disabled  Tobacco Use   Smoking status: Never    Passive exposure: Never   Smokeless  tobacco: Never  Vaping Use   Vaping Use: Never used  Substance and Sexual Activity   Alcohol use: Yes    Comment: 1 drinks per week   Drug use: No   Sexual activity: Not on file    Comment: Married

## 2022-06-21 DIAGNOSIS — R69 Illness, unspecified: Secondary | ICD-10-CM | POA: Diagnosis not present

## 2022-06-28 ENCOUNTER — Telehealth (HOSPITAL_BASED_OUTPATIENT_CLINIC_OR_DEPARTMENT_OTHER): Payer: Self-pay | Admitting: Family Medicine

## 2022-06-28 NOTE — Telephone Encounter (Signed)
Contacted Corliss [facility] to schedule their annual wellness visit. Welcome to Medicare visit Due by 01/31/2023.  Thank you,  [place] Direct dial  [phone]

## 2022-07-01 DIAGNOSIS — R69 Illness, unspecified: Secondary | ICD-10-CM | POA: Diagnosis not present

## 2022-07-06 ENCOUNTER — Other Ambulatory Visit (HOSPITAL_BASED_OUTPATIENT_CLINIC_OR_DEPARTMENT_OTHER): Payer: Self-pay

## 2022-07-06 ENCOUNTER — Other Ambulatory Visit (HOSPITAL_COMMUNITY): Payer: Self-pay

## 2022-07-06 DIAGNOSIS — M5136 Other intervertebral disc degeneration, lumbar region: Secondary | ICD-10-CM | POA: Diagnosis not present

## 2022-07-06 DIAGNOSIS — M545 Low back pain, unspecified: Secondary | ICD-10-CM | POA: Diagnosis not present

## 2022-07-06 MED ORDER — MELOXICAM 15 MG PO TABS
15.0000 mg | ORAL_TABLET | Freq: Every day | ORAL | 0 refills | Status: DC
  Filled 2022-07-06 (×2): qty 30, 30d supply, fill #0

## 2022-07-13 ENCOUNTER — Encounter (HOSPITAL_BASED_OUTPATIENT_CLINIC_OR_DEPARTMENT_OTHER): Payer: Self-pay | Admitting: Family Medicine

## 2022-07-13 ENCOUNTER — Ambulatory Visit (INDEPENDENT_AMBULATORY_CARE_PROVIDER_SITE_OTHER): Payer: Medicare HMO | Admitting: Family Medicine

## 2022-07-13 VITALS — BP 159/72 | HR 60 | Ht 69.0 in | Wt 169.0 lb

## 2022-07-13 DIAGNOSIS — M545 Low back pain, unspecified: Secondary | ICD-10-CM | POA: Diagnosis not present

## 2022-07-13 NOTE — Progress Notes (Signed)
    Procedures performed today:    None.  Independent interpretation of notes and tests performed by another provider:   None.  Brief History, Exam, Impression, and Recommendations:    BP (!) 159/72 (BP Location: Left Arm, Patient Position: Sitting, Cuff Size: Normal)   Pulse 60   Ht 5\' 9"  (1.753 m)   Wt 169 lb (76.7 kg)   SpO2 100%   BMI 24.96 kg/m   Low back pain Patient reports that he has been having ongoing issues with low back pain.  Has had intermittent issues in the past with increased symptoms more recently.  Does have some pain into thighs, no pain extending through the legs bilaterally.  Has not had any loss of no reported changes in bowel or bladder control. Has had prior lumbar spine MRI in 2015 with evidence of mild spinal canal stenosis, facet hypertrophy On exam, patient with no tenderness to palpation through spinous processes in the lumbar region, mild tenderness to palpation to paraspinal muscles within lumbar region.  Negative straight leg raise bilaterally.  Deep tendon reflexes intact and symmetric for patellar reflex and Achilles reflex. Discussed general recommendations in relation to current low back pain.  Feel that proceeding with conservative measures, referral to physical therapy would be appropriate and patient is interested in this.  Referral to physical therapy placed Can utilize topical treatments to help with control of symptoms, advised on home exercise program as per PT Will continue to monitor symptoms and if not improving as expected proceed with imaging.  Return in about 3 months (around 10/13/2022).   ___________________________________________ Sabrin Dunlevy de [place], MD, ABFM, CAQSM [facility]

## 2022-07-19 NOTE — Assessment & Plan Note (Signed)
Patient reports that he has been having ongoing issues with low back pain.  Has had intermittent issues in the past with increased symptoms more recently.  Does have some pain into thighs, no pain extending through the legs bilaterally.  Has not had any loss of no reported changes in bowel or bladder control. Has had prior lumbar spine MRI in 2015 with evidence of mild spinal canal stenosis, facet hypertrophy On exam, patient with no tenderness to palpation through spinous processes in the lumbar region, mild tenderness to palpation to paraspinal muscles within lumbar region.  Negative straight leg raise bilaterally.  Deep tendon reflexes intact and symmetric for patellar reflex and Achilles reflex. Discussed general recommendations in relation to current low back pain.  Feel that proceeding with conservative measures, referral to physical therapy would be appropriate and patient is interested in this.  Referral to physical therapy placed Can utilize topical treatments to help with control of symptoms, advised on home exercise program as per PT Will continue to monitor symptoms and if not improving as expected proceed with imaging.

## 2022-07-28 DIAGNOSIS — M47896 Other spondylosis, lumbar region: Secondary | ICD-10-CM | POA: Diagnosis not present

## 2022-08-01 DIAGNOSIS — R69 Illness, unspecified: Secondary | ICD-10-CM | POA: Diagnosis not present

## 2022-08-08 DIAGNOSIS — G5601 Carpal tunnel syndrome, right upper limb: Secondary | ICD-10-CM | POA: Diagnosis not present

## 2022-08-08 DIAGNOSIS — M79641 Pain in right hand: Secondary | ICD-10-CM | POA: Diagnosis not present

## 2022-08-23 ENCOUNTER — Ambulatory Visit (HOSPITAL_BASED_OUTPATIENT_CLINIC_OR_DEPARTMENT_OTHER): Payer: Medicare HMO | Attending: Family Medicine | Admitting: Physical Therapy

## 2022-08-23 DIAGNOSIS — M545 Low back pain, unspecified: Secondary | ICD-10-CM | POA: Diagnosis not present

## 2022-08-23 DIAGNOSIS — M6281 Muscle weakness (generalized): Secondary | ICD-10-CM | POA: Diagnosis not present

## 2022-08-23 DIAGNOSIS — M5459 Other low back pain: Secondary | ICD-10-CM

## 2022-08-23 NOTE — Therapy (Incomplete)
OUTPATIENT PHYSICAL THERAPY THORACOLUMBAR EVALUATION   Patient Name: [name] MRN: [id number] DOB:1966-01-27, 57 y.o., male Today's Date: 08/24/2022  END OF SESSION:  PT End of Session - 08/23/22 1300     Visit Number 1    Number of Visits 8    Date for PT Re-Evaluation 11/15/22    Authorization Type AETNA MCR    PT Start Time 1155    PT Stop Time 1250    PT Time Calculation (min) 55 min    Activity Tolerance Patient tolerated treatment well    Behavior During Therapy [facility] for tasks assessed/performed             Past Medical History:  Diagnosis Date   Anemia    History of avascular necrosis of capital femoral epiphysis 2006, 2007   Bilateral Femoral Head   HLD (hyperlipidemia)    Pneumonia    twice   Psoriatic arthritis    Past Surgical History:  Procedure Laterality Date   JOINT REPLACEMENT Bilateral 2006, 2007   Necrosis femoral head (bilateral)   TOTAL HIP REVISION Right 11/23/2016   Procedure: Acetabulum liner and femoral head revision;  Surgeon: Ollen Gross, MD;  Location: [facility];  Service: Orthopedics;  Laterality: Right;   TOTAL HIP REVISION Left 04/10/2019   Procedure: Left hip bearing surface vs total hip arthroplasty revision;  Surgeon: Ollen Gross, MD;  Location: [facility];  Service: Orthopedics;  Laterality: Left;    Patient Active Problem List   Diagnosis Date Noted   Low back pain 07/13/2022   Corns and callosities 01/15/2020   Hammer toes, bilateral 01/15/2020   Orthopnea 11/01/2019   History of COVID-19 07/30/2019   Acute deep vein thrombosis (DVT) of left lower extremity 07/30/2019   Abnormal CT of the chest 07/30/2019   Cough 07/30/2019   Leg DVT (deep venous thromboembolism), acute, bilateral 07/24/2019   HTN (hypertension) 07/24/2019   COVID-19 virus infection 07/20/2019   Acute respiratory failure with hypoxia 07/20/2019   Acute pulmonary embolism 07/19/2019   Essential tremor 04/03/2017   Failed total hip arthroplasty  11/23/2016   High risk medications (not anticoagulants) long-term use 09/20/2016   Bilateral hand pain 09/20/2016   DDD (degenerative disc disease), cervical 09/20/2016   History of total hip replacement, bilateral 09/20/2016   DDD (degenerative disc disease), lumbar 09/20/2016   Bilateral plantar fasciitis 09/20/2016   Psoriasis 09/20/2016   Primary osteoarthritis of both feet 09/20/2016   Primary osteoarthritis of both hands 09/20/2016   Primary osteoarthritis of both knees 09/20/2016   Prediabetes 09/22/2014   Hyperlipidemia 09/22/2014   URI (upper respiratory infection) 07/07/2012   Acute bronchitis 07/07/2012   OBESITY 07/03/2008   AVASCULAR NECROSIS, FEMORAL HEAD 07/03/2008   Psoriatic arthritis 05/02/2001     REFERRING PROVIDER: de [place], Raymond J, MD   REFERRING DIAG: M54.50 (ICD-10-CM) - Acute bilateral low back pain, unspecified whether sciatica present   Rationale for Evaluation and Treatment: Rehabilitation  THERAPY DIAG:  Other low back pain  Muscle weakness (generalized)  ONSET DATE: February 2024  SUBJECTIVE:  SUBJECTIVE STATEMENT: He bent over to tie his shoes in February and had pain in his low back.  He took a mm relaxer and his L LE gave way when he was bending over to pick up his dog's droppings.  He fell and struck the top of his head.  Pt was worried about his L knee turning in and can't remember if his back hurt worse.  He went to [facility] urgent care on 3/6 to check his back out after the fall. Pt had x rays.  Notes indicated pain consistent with axial LBP likely secondary to facet arthrosis.   Pt saw MD on 07/13/22.  MD ordered PT. Pt to follow up with MD in about 3 months.   Pt states his L knee turns in, but his leg hasn't given way recently.  He is worried more  about his leg than his back. He denies pain with bending over to pick up after his dog, but is apprehensive due to his L LE.   "Normally there is not much pain".  Pt's back pain has improved.  Pt has missed work due to his back pain.  Pt has pain with lifting heavy trash bags at work.  Pt has tightness in lumbar.  He reports stiffness in the AM which improves as he stretches.  Pt denies pain with bending over.  Pt denies any radicular pain or N/T into LE's.   Pt performing hip abd and add machine, squats with Trx straps, octane x ride, elliptical 2-3x/wk at the gym.  He states he feels stronger for a short period after the hip machines.   PERTINENT HISTORY:  Bilat THR with posterolateral approach and THR revisions on R in 2018 and L in 2020 Psoriatic arthritis R CTS with surgery scheduled for 09/07/22 DVT/PE due to Covid  PAIN:  Are you having pain?  NPRS:  0/10 current and best, 3-4/10 worst Location: lumbar  PRECAUTIONS: Other: bilat THR with revisions  WEIGHT BEARING RESTRICTIONS: No  FALLS:  Has patient fallen in last 6 months? Yes. Number of falls 1 fall as stated above    OCCUPATION: part time work as a Mudlogger at [employer].  PLOF: {PLOF:24004}  Pt reports he wasn't having back pain prior to February 2024.  PATIENT GOALS: understand what is going on with his L LE   OBJECTIVE:   DIAGNOSTIC FINDINGS:  X rays on 3/6:  multilevel deg changes with dis space narrowing at L3-4, L4-5, L5-S1.  Facet arthrosis L4-5 and L5-S1.  Possible compression deformiy at L5.  MRI in 2015: L2-3 central disc protrusion with mild spinal stenosis, L3-4 mild spinal stensos with superimposed R sided disc protrusion, L4-5 spondylosis and facet hypertrop with mild spinal stenosis and foraminal stenosis bilat, L5-S1 spondylosis with mild foraminal encroachment bilat.      PATIENT SURVEYS:  Will give FOTO next visit    COGNITION: Overall cognitive status: Within functional limits for tasks  assessed      MUSCLE LENGTH: Pt has tightness in bilat HS.    LUMBAR ROM:   AROM eval  Flexion WFL  Extension 75%  Right lateral flexion 25%  Left lateral flexion 50%  Right rotation 60%  Left rotation 75%   (Blank rows = not tested) Pt states he feels tightness on R when he stretches L QL on his own  LOWER EXTREMITY ROM:     Active  Right eval Left eval  Hip flexion    Hip extension    Hip abduction  Hip adduction    Hip internal rotation    Hip external rotation 15 Unable to perform / PROM:  13 deg.  Pt able to perform bent knee fall out with L LE  Knee flexion    Knee extension    Ankle dorsiflexion    Ankle plantarflexion    Ankle inversion    Ankle eversion     (Blank rows = not tested)  LOWER EXTREMITY MMT:    MMT Right eval Left eval  Hip flexion 5/5 5/5  Hip extension    Hip abduction 4/5 4/5  Hip adduction    Hip internal rotation    Hip external rotation Tolerates min resistance Unable to tolerate resistance  Knee flexion 5/5, seated 5/5, seated  Knee extension 5/5 5/5  Ankle dorsiflexion    Ankle plantarflexion    Ankle inversion    Ankle eversion     (Blank rows = not tested)  LUMBAR SPECIAL TESTS:  Supine SLR test:  negative bilat   FUNCTIONAL TESTS:  Pt had increased L knee IR when performing sit to stand. 5x STS test:  11.2 without UE  GAIT: Assistive device utilized: None Level of assistance: Complete Independence Comments: limited reciprocal arm swing, increased toe out on R  TODAY'S TREATMENT:                                                                                                                               See below for pt education.  PT reviewed current exercise routine.    PATIENT EDUCATION:  Education details: objective findings, dx, prognosis, POC, relevant anatomy, and rationale of exercises and interventions.  PT answered Pt's questions.  Person educated: Patient Education method: Explanation,  demonstration Education comprehension: verbalized understanding and needs further education  HOME EXERCISE PROGRAM: Will give next visit  ASSESSMENT:  CLINICAL IMPRESSION: Patient is a 57 y.o. male with a dx of acute bilat LBP which began in February.  He reports his back pain is much better now though he is very concerned about his L knee turning in which happens a lot.  He has missed work due to his back pain, and has pain with lifting heavy trash bags at work.  Pt has a hx of bilat THR with revisions and has weakness in bilat hip abd and ER.  He does have some limitations in lumbar AROM.  He demonstrates increased L knee IR with performing a sit to stand transfer.  Pt unable to perform L hip ER seated with LE hanging off edge of table though is able to perform a bent knee fall out in hooklying.  Pt is active in the gym currently working out 2-3x/wk.  Pt should benefit from skilled PT services to address impairments and to improve overall function.     OBJECTIVE IMPAIRMENTS: {opptimpairments:25111}.   ACTIVITY LIMITATIONS: {activitylimitations:27494}  PARTICIPATION LIMITATIONS: {participationrestrictions:25113}  PERSONAL FACTORS: 3+ comorbidities: bilat THR, psoriatic arthritis, prior DVT/PE, and planning to  have carpal tunnel surgery in May  are also affecting patient's functional outcome.   REHAB POTENTIAL: Good  CLINICAL DECISION MAKING: Stable/uncomplicated  EVALUATION COMPLEXITY: Low   GOALS:  SHORT TERM GOALS: Target date: ***  *** Baseline: Goal status: {GOALSTATUS:25110}  2.  *** Baseline:  Goal status: {GOALSTATUS:25110}  3.  *** Baseline:  Goal status: {GOALSTATUS:25110}  4.  *** Baseline:  Goal status: {GOALSTATUS:25110}  5.  *** Baseline:  Goal status: {GOALSTATUS:25110}  6.  *** Baseline:  Goal status: {GOALSTATUS:25110}  LONG TERM GOALS: Target date: 11/15/2022    *** Baseline:  Goal status: {GOALSTATUS:25110}  2.  *** Baseline:  Goal  status: {GOALSTATUS:25110}  3.  *** Baseline:  Goal status: {GOALSTATUS:25110}  4.  *** Baseline:  Goal status: {GOALSTATUS:25110}  5.  *** Baseline:  Goal status: {GOALSTATUS:25110}  6.  *** Baseline:  Goal status: {GOALSTATUS:25110}  PLAN:  PT FREQUENCY: 1-2x/week  PT DURATION: Cert placed out for 12 weeks due to pt having carpal tunnel surgery  PLANNED INTERVENTIONS: {rehab planned interventions:25118::"Therapeutic exercises","Therapeutic activity","Neuromuscular re-education","Balance training","Gait training","Patient/Family education","Self Care","Joint mobilization"}.  PLAN FOR NEXT SESSION: assess STM in lumbar paraspinals and tenderness.  Give FOTO next visit.   Aaron Edelman, PT 08/24/2022, 6:35 PM

## 2022-08-24 ENCOUNTER — Encounter (HOSPITAL_BASED_OUTPATIENT_CLINIC_OR_DEPARTMENT_OTHER): Payer: Self-pay | Admitting: Physical Therapy

## 2022-08-25 ENCOUNTER — Ambulatory Visit (HOSPITAL_BASED_OUTPATIENT_CLINIC_OR_DEPARTMENT_OTHER): Payer: Medicare HMO | Admitting: Physical Therapy

## 2022-08-25 DIAGNOSIS — M6281 Muscle weakness (generalized): Secondary | ICD-10-CM | POA: Diagnosis not present

## 2022-08-25 DIAGNOSIS — M5459 Other low back pain: Secondary | ICD-10-CM | POA: Diagnosis not present

## 2022-08-25 DIAGNOSIS — M545 Low back pain, unspecified: Secondary | ICD-10-CM | POA: Diagnosis not present

## 2022-08-25 NOTE — Therapy (Signed)
OUTPATIENT PHYSICAL THERAPY TREATMENT   Patient Name: [name] MRN: [id number] DOB:1966-02-16, 57 y.o., male Today's Date: 08/26/2022  END OF SESSION:  PT End of Session - 08/23/22 1300       Visit Number 2     Number of Visits 8     Date for PT Re-Evaluation 11/15/22     Authorization Type AETNA MCR     PT Start Time 1154     PT Stop Time 1239     PT Time Calculation (min) 45 min     Activity Tolerance Patient tolerated treatment well     Behavior During Therapy [facility] for tasks assessed/performed     Past Medical History:  Diagnosis Date   Anemia    History of avascular necrosis of capital femoral epiphysis 2006, 2007   Bilateral Femoral Head   HLD (hyperlipidemia)    Pneumonia    twice   Psoriatic arthritis [facility])    Past Surgical History:  Procedure Laterality Date   JOINT REPLACEMENT Bilateral 2006, 2007   Necrosis femoral head (bilateral)   TOTAL HIP REVISION Right 11/23/2016   Procedure: Acetabulum liner and femoral head revision;  Surgeon: Ollen Gross, MD;  Location: [facility];  Service: Orthopedics;  Laterality: Right;   TOTAL HIP REVISION Left 04/10/2019   Procedure: Left hip bearing surface vs total hip arthroplasty revision;  Surgeon: Ollen Gross, MD;  Location: [facility];  Service: Orthopedics;  Laterality: Left;    Patient Active Problem List   Diagnosis Date Noted   Low back pain 07/13/2022   Corns and callosities 01/15/2020   Hammer toes, bilateral 01/15/2020   Orthopnea 11/01/2019   History of COVID-19 07/30/2019   Acute deep vein thrombosis (DVT) of left lower extremity (HCC) 07/30/2019   Abnormal CT of the chest 07/30/2019   Cough 07/30/2019   Leg DVT (deep venous thromboembolism), acute, bilateral (HCC) 07/24/2019   HTN (hypertension) 07/24/2019   COVID-19 virus infection 07/20/2019   Acute respiratory failure with hypoxia (HCC) 07/20/2019   Acute pulmonary embolism (HCC) 07/19/2019   Essential tremor 04/03/2017   Failed total hip  arthroplasty (HCC) 11/23/2016   High risk medications (not anticoagulants) long-term use 09/20/2016   Bilateral hand pain 09/20/2016   DDD (degenerative disc disease), cervical 09/20/2016   History of total hip replacement, bilateral 09/20/2016   DDD (degenerative disc disease), lumbar 09/20/2016   Bilateral plantar fasciitis 09/20/2016   Psoriasis 09/20/2016   Primary osteoarthritis of both feet 09/20/2016   Primary osteoarthritis of both hands 09/20/2016   Primary osteoarthritis of both knees 09/20/2016   Prediabetes 09/22/2014   Hyperlipidemia 09/22/2014   URI (upper respiratory infection) 07/07/2012   Acute bronchitis 07/07/2012   OBESITY 07/03/2008   AVASCULAR NECROSIS, FEMORAL HEAD 07/03/2008   Psoriatic arthritis (HCC) 05/02/2001     REFERRING PROVIDER: [place], Raymond J, MD   REFERRING DIAG: M54.50 (ICD-10-CM) - Acute bilateral low back pain, unspecified whether sciatica present   Rationale for Evaluation and Treatment: Rehabilitation  THERAPY DIAG:  Other low back pain  Muscle weakness (generalized)  ONSET DATE: February 2024  SUBJECTIVE:  SUBJECTIVE STATEMENT: Pt denies any adverse effects after prior Rx.  Pt states he was "spent" after work yesterday and had increased lumbar pain.  He had pain as he was sitting in his chair last night though felt better when waking up this AM.  Pt has performed his gym workout this AM.   PERTINENT HISTORY:  Bilat THR with posterolateral approach and THR revisions on R in 2018 and L in 2020 Psoriatic arthritis R CTS with surgery scheduled for 09/07/22 DVT/PE due to Covid  PAIN:  Are you having pain?  NPRS:  0/10 current and best, 3-4/10 worst Location: lumbar Pt reports having tightness, not pain currently.   PRECAUTIONS: Other: bilat THR  with revisions  WEIGHT BEARING RESTRICTIONS: No  FALLS:  Has patient fallen in last 6 months? Yes. Number of falls 1 fall as stated above    OCCUPATION: part time work as a Mudlogger at [employer].  PLOF: Independent  Pt reports he wasn't having back pain prior to February 2024.  PATIENT GOALS: understand what is going on with his L LE   OBJECTIVE:   DIAGNOSTIC FINDINGS:  X rays on 3/6:  multilevel deg changes with dis space narrowing at L3-4, L4-5, L5-S1.  Facet arthrosis L4-5 and L5-S1.  Possible compression deformiy at L5.  MRI in 2015: L2-3 central disc protrusion with mild spinal stenosis, L3-4 mild spinal stensos with superimposed R sided disc protrusion, L4-5 spondylosis and facet hypertrop with mild spinal stenosis and foraminal stenosis bilat, L5-S1 spondylosis with mild foraminal encroachment bilat.       TODAY'S TREATMENT:                                                                                                                                  FOTO:  70 with a goal of 77 at visit 9.    PT instructed in performance of and palpation of TrA contractions.  Pt performed supine TrA contractions with and without 5 sec hold.  Pt performed:   Supine clams with RTB 2x10   Seated clams with RTB x10   Standing hip abd 2x10 bilat   Lateral band walks with RTB with UE support x 2 laps at rail   Pt received a HEP handout and was educated in correct form and appropriate frequency.  Pt instructed in correct positioning of hips/LE's with supine clams including to not let knees go past neutral when returning to starting position. Pt instructed he should not have pain with HEP and to stop exercise if he does.   Palpation:  Pt had minimal tightness in bilat lumbar paraspinals but no significant tightness.  TTP at L2 SP.  He had no tenderness in palpating other lumbar SP's and lumbar paraspinals.    See below for pt education.  PATIENT EDUCATION:  Education details: PT answered  questions.  dx, POC, relevant anatomy, knee positioning, and rationale of exercises and interventions.  PT answered Pt's  questions.  Person educated: Patient Education method: Explanation, demonstration Education comprehension: verbalized understanding and needs further education  HOME EXERCISE PROGRAM: Access Code: [email or web address] URL: [email or web address] Date: 08/25/2022 Prepared by: Aaron Edelman  Exercises - Supine Transversus Abdominis Bracing - Hands on Stomach  - 2-3 x daily - 7 x weekly - 1-2 sets - 10 reps - 5 second hold - Hooklying Clamshell with Resistance  - 1 x daily - 4 x weekly - 2-3 sets - 10 reps  ASSESSMENT:  CLINICAL IMPRESSION: Pt performed exercises focusing on core and hip strength.  He performed exercises well with cuing and instruction in correct form and had no c/o's with exercises.  PT established HEP today.  PT gave pt a HEP handout and pt demonstrates good understanding.  PT educated pt concerning knee positioning with transfers and squatting.  Pt didn't have significant tightness in lumbar paraspinals and was only tender to palpate at [facility] of L2.  Pt completed FOTO today.  He responded well to Rx having no pain and no c/o's after Rx.       OBJECTIVE IMPAIRMENTS: decreased ROM, decreased strength, hypomobility, impaired flexibility, and pain.   ACTIVITY LIMITATIONS: lifting  PARTICIPATION LIMITATIONS:   PERSONAL FACTORS: 3+ comorbidities: bilat THR, psoriatic arthritis, prior DVT/PE, and planning to have carpal tunnel surgery in May  are also affecting patient's functional outcome.   REHAB POTENTIAL: Good  CLINICAL DECISION MAKING: Stable/uncomplicated  EVALUATION COMPLEXITY: Low   GOALS:  SHORT TERM GOALS: Target date: 09/06/2022   Pt will demo good understanding of HEP and perform HEP without adverse effects for improved strength, pain, and function.  Baseline: Goal status: INITIAL  2.  Pt will demo improved knee positioning (decreased  IR) with sit to stand transfers.  Baseline:  Goal status: INITIAL    LONG TERM GOALS: Target date: 11/15/2022    Pt will demo improved strength in bilat LE's including 4+/5 in hip abd, 4/5 in R hip ER, and the ability to tolerate min resistance in L hip ER for improved performance of and tolerance with daily and functional mobility.  Baseline:  Goal status: INITIAL  2.  Pt will be independent and compliant with HEP for improved pain, strength, function, and performance of work activities.   Baseline:  Goal status: INITIAL  3.  Pt will demo at least 10 deg of L hip ER AROM for improved hip mobility and daily mobility.  Baseline:  Goal status: INITIAL  4.  Pt will report he is able to perform his ADLs/IADLs, functional mobility, and work activities without increased lumbar pain.  Baseline:  Goal status: INITIAL   PLAN:  PT FREQUENCY: 1-2x/week  PT DURATION: Cert placed out for 12 weeks due to pt having carpal tunnel surgery  PLANNED INTERVENTIONS: Therapeutic exercises, Therapeutic activity, Neuromuscular re-education, Balance training, Gait training, Patient/Family education, Self Care, Joint mobilization, Stair training, Aquatic Therapy, Dry Needling, Electrical stimulation, Spinal mobilization, Cryotherapy, Moist heat, Taping, Manual therapy, and Re-evaluation.  PLAN FOR NEXT SESSION:  Cont with core and hip strengthening.  Pt is having carpal tunnel surgery on 09/07/22   Audie Clear III PT, DPT 08/26/22 10:41 PM

## 2022-08-26 ENCOUNTER — Encounter (HOSPITAL_BASED_OUTPATIENT_CLINIC_OR_DEPARTMENT_OTHER): Payer: Self-pay | Admitting: Physical Therapy

## 2022-08-30 ENCOUNTER — Ambulatory Visit (HOSPITAL_BASED_OUTPATIENT_CLINIC_OR_DEPARTMENT_OTHER): Payer: Medicare HMO | Admitting: Physical Therapy

## 2022-08-30 ENCOUNTER — Encounter (HOSPITAL_BASED_OUTPATIENT_CLINIC_OR_DEPARTMENT_OTHER): Payer: Self-pay | Admitting: Physical Therapy

## 2022-08-30 DIAGNOSIS — M545 Low back pain, unspecified: Secondary | ICD-10-CM | POA: Diagnosis not present

## 2022-08-30 DIAGNOSIS — M5459 Other low back pain: Secondary | ICD-10-CM | POA: Diagnosis not present

## 2022-08-30 DIAGNOSIS — M6281 Muscle weakness (generalized): Secondary | ICD-10-CM | POA: Diagnosis not present

## 2022-08-30 NOTE — Therapy (Addendum)
OUTPATIENT PHYSICAL THERAPY TREATMENT   Patient Name: [name] MRN: [id number] DOB:1965/06/25, 57 y.o., male Today's Date: 08/30/2022  END OF SESSION:  PT End of Session - 08/30/22 1416     Visit Number 3    Number of Visits 8    Date for PT Re-Evaluation 11/15/22    Authorization Type [employer]ET[employer] MCR    PT Start Time 1410    PT Stop Time 1449    PT Time Calculation (min) 39 min    Activity Tolerance Patient tolerated treatment well    Behavior During Therapy WFL for tasks assessed/performed                Past Medical History:  Diagnosis Date   Anemia    History of avascular necrosis of capital femoral epiphysis 2006, 2007   Bilateral Femoral Head   HLD (hyperlipidemia)    Pneumonia    twice   Psoriatic arthritis [facility])    Past Surgical History:  Procedure Laterality Date   JOINT REPLACEMENT Bilateral 2006, 2007   Necrosis femoral head (bilateral)   TOTAL HIP REVISION Right 11/23/2016   Procedure: Acetabulum liner and femoral head revision;  Surgeon: Ollen Gross, MD;  Location: [facility];  Service: Orthopedics;  Laterality: Right;   TOTAL HIP REVISION Left 04/10/2019   Procedure: Left hip bearing surface vs total hip arthroplasty revision;  Surgeon: Ollen Gross, MD;  Location: [facility];  Service: Orthopedics;  Laterality: Left;    Patient Active Problem List   Diagnosis Date Noted   Low back pain 07/13/2022   Corns and callosities 01/15/2020   Hammer toes, bilateral 01/15/2020   Orthopnea 11/01/2019   History of COVID-19 07/30/2019   Acute deep vein thrombosis (DVT) of left lower extremity (HCC) 07/30/2019   Abnormal CT of the chest 07/30/2019   Cough 07/30/2019   Leg DVT (deep venous thromboembolism), acute, bilateral (HCC) 07/24/2019   HTN (hypertension) 07/24/2019   COVID-19 virus infection 07/20/2019   Acute respiratory failure with hypoxia (HCC) 07/20/2019   Acute pulmonary embolism (HCC) 07/19/2019   Essential tremor 04/03/2017   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/23/2016   High risk medications (not anticoagulants) long-term use 09/20/2016   Bilateral hand pain 09/20/2016   DDD (degenerative disc disease), cervical 09/20/2016   History of total hip replacement, bilateral 09/20/2016   DDD (degenerative disc disease), lumbar 09/20/2016   Bilateral plantar fasciitis 09/20/2016   Psoriasis 09/20/2016   Primary osteoarthritis of both feet 09/20/2016   Primary osteoarthritis of both hands 09/20/2016   Primary osteoarthritis of both knees 09/20/2016   Prediabetes 09/22/2014   Hyperlipidemia 09/22/2014   URI (upper respiratory infection) 07/07/2012   Acute bronchitis 07/07/2012   OBESITY 07/03/2008   AVASCULAR NECROSIS, FEMORAL HEAD 07/03/2008   Psoriatic arthritis (HCC) 05/02/2001     REFERRING PROVIDER: de [place], Raymond J, MD   REFERRING DIAG: M54.50 (ICD-10-CM) - Acute bilateral low back pain, unspecified whether sciatica present   Rationale for Evaluation and Treatment: Rehabilitation  THERAPY DIAG:  Other low back pain  Muscle weakness (generalized)  ONSET DATE: February 2024  SUBJECTIVE:  SUBJECTIVE STATEMENT: Pt denies any adverse effects after prior Rx.  Pt typically feels pain on his R side of his lumbar though had some pain on his L side this AM going down the stairs.  He notices his pain, but it hasn't been "too bothersome".  Pt hasn't worked since last PT visit.    PERTINENT HISTORY:  Bilat THR with posterolateral approach and THR revisions on R in 2018 and L in 2020 Psoriatic arthritis R CTS with surgery scheduled for 09/07/22 DVT/PE due to Covid  PAIN:  Are you having pain?  NPRS:  0/10 current and best, 3-4/10 worst Location: lumbar Pt reports having tightness, not pain currently.   PRECAUTIONS: Other: bilat THR  with revisions  WEIGHT BEARING RESTRICTIONS: No  FALLS:  Has patient fallen in last 6 months? Yes. Number of falls 1 fall as stated above    OCCUPATION: part time work as a Mudlogger at [employer].  PLOF: Independent  Pt reports he wasn't having back pain prior to February 2024.  PATIENT GOALS: understand what is going on with his L LE   OBJECTIVE:   DIAGNOSTIC FINDINGS:  X rays on 3/6:  multilevel deg changes with dis space narrowing at L3-4, L4-5, L5-S1.  Facet arthrosis L4-5 and L5-S1.  Possible compression deformiy at L5.  MRI in 2015: L2-3 central disc protrusion with mild spinal stenosis, L3-4 mild spinal stensos with superimposed R sided disc protrusion, L4-5 spondylosis and facet hypertrop with mild spinal stenosis and foraminal stenosis bilat, L5-S1 spondylosis with mild foraminal encroachment bilat.       TODAY'S TREATMENT:                                                                                                                                   PT instructed in performance of and palpation of TrA contractions.  Pt performed supine TrA contractions with and without 5 sec hold.  Pt performed:   Supine alt LE extension with TrA 2x10          Supine clams with GTB 3x10   Supine shoulder flex/ext with ball 2x10   Lateral band walks with RTB around thighs with UE support x 3 laps at rail   Pt has increased knee IR on L with squatting which worsens as he squats lower.  Pt performed squats with RTB around thighs for tactile cues to improve knee positioning.  PT also gave verbal and visual cues to improve L knee alignment with squatting.      See below for pt education.  PATIENT EDUCATION:  Education details: PT answered questions.  dx, POC, relevant anatomy, knee positioning, and rationale of exercises and interventions.  PT answered Pt's questions.  PT educated pt in progression of home exercises and in correct frequency and reps/sets. Person educated:  Patient Education method: Explanation, demonstration Education comprehension: verbalized understanding and needs further education  HOME EXERCISE PROGRAM: Access Code: [email or web address] URL: [email or web address]  Date: 08/25/2022 Prepared by: Aaron Edelman  Exercises - Supine Transversus Abdominis Bracing - Hands on Stomach  - 2-3 x daily - 7 x weekly - 1-2 sets - 10 reps - 5 second hold - Hooklying Clamshell with Resistance  - 1 x daily - 4 x weekly - 2-3 sets - 10 reps  ASSESSMENT:  CLINICAL IMPRESSION: Pt required much instruction and cuing in order to perform TrA contractions correctly.  PT worked on improving core and hip strength and knee positioning with squatting.  Pt had no hip pain or lumbar pain with performing exercises.  Pt demonstrated his squatting form which he performs with Trx straps.  He goes fairly deep with his squats and has significant knee IR.  PT educated pt in correct squatting form including improving knee alignment and not going as deep.  As pt moves deeper into his squat, his knee IR worsens.  PT worked on improving form with squats providing verbal and visual cues and also used a band around his thighs for a tactile cue to reduce knee IR.  Pt has much difficulty correcting knee IR with squats.  He responded well to Rx having no c/o's after Rx.      OBJECTIVE IMPAIRMENTS: decreased ROM, decreased strength, hypomobility, impaired flexibility, and pain.   ACTIVITY LIMITATIONS: lifting  PARTICIPATION LIMITATIONS:   PERSONAL FACTORS: 3+ comorbidities: bilat THR, psoriatic arthritis, prior DVT/PE, and planning to have carpal tunnel surgery in May  are also affecting patient's functional outcome.   REHAB POTENTIAL: Good  CLINICAL DECISION MAKING: Stable/uncomplicated  EVALUATION COMPLEXITY: Low   GOALS:  SHORT TERM GOALS: Target date: 09/06/2022   Pt will demo good understanding of HEP and perform HEP without adverse effects for improved strength,  pain, and function.  Baseline: Goal status: INITIAL  2.  Pt will demo improved knee positioning (decreased IR) with sit to stand transfers.  Baseline:  Goal status: INITIAL    LONG TERM GOALS: Target date: 11/15/2022    Pt will demo improved strength in bilat LE's including 4+/5 in hip abd, 4/5 in R hip ER, and the ability to tolerate min resistance in L hip ER for improved performance of and tolerance with daily and functional mobility.  Baseline:  Goal status: INITIAL  2.  Pt will be independent and compliant with HEP for improved pain, strength, function, and performance of work activities.   Baseline:  Goal status: INITIAL  3.  Pt will demo at least 10 deg of L hip ER AROM for improved hip mobility and daily mobility.  Baseline:  Goal status: INITIAL  4.  Pt will report he is able to perform his ADLs/IADLs, functional mobility, and work activities without increased lumbar pain.  Baseline:  Goal status: INITIAL   PLAN:  PT FREQUENCY: 1-2x/week  PT DURATION: Cert placed out for 12 weeks due to pt having carpal tunnel surgery  PLANNED INTERVENTIONS: Therapeutic exercises, Therapeutic activity, Neuromuscular re-education, Balance training, Gait training, Patient/Family education, Self Care, Joint mobilization, Stair training, Aquatic Therapy, Dry Needling, Electrical stimulation, Spinal mobilization, Cryotherapy, Moist heat, Taping, Manual therapy, and Re-evaluation.  PLAN FOR NEXT SESSION:  Cont with core and hip strengthening.  Pt is having carpal tunnel surgery on 09/07/22   Audie Clear III PT, DPT 08/30/22 11:40 PM  PHYSICAL THERAPY DISCHARGE SUMMARY  Visits from Start of Care: 3  Current functional level related to goals / functional outcomes: Unable to assess due to pt not present at discharge.   Remaining deficits: Unable to  assess due to pt not present at discharge.   Education / Equipment: HEP   Patient was seen in PT from 08/23/22 - 08/30/22.  Pt was  having carpal tunnel surgery on 09/07/22 and pt did not schedule any further PT.  He will be considered discharged at this time.     Audie Clear III PT, DPT 06/05/23 8:28 AM

## 2022-08-31 DIAGNOSIS — R69 Illness, unspecified: Secondary | ICD-10-CM | POA: Diagnosis not present

## 2022-08-31 HISTORY — PX: CARPAL TUNNEL RELEASE: SHX101

## 2022-09-07 ENCOUNTER — Other Ambulatory Visit (HOSPITAL_BASED_OUTPATIENT_CLINIC_OR_DEPARTMENT_OTHER): Payer: Self-pay

## 2022-09-07 DIAGNOSIS — G5601 Carpal tunnel syndrome, right upper limb: Secondary | ICD-10-CM | POA: Diagnosis not present

## 2022-09-07 DIAGNOSIS — Z4789 Encounter for other orthopedic aftercare: Secondary | ICD-10-CM | POA: Diagnosis not present

## 2022-09-07 MED ORDER — OXYCODONE HCL 5 MG PO TABS
5.0000 mg | ORAL_TABLET | ORAL | 0 refills | Status: AC
  Filled 2022-09-07: qty 42, 7d supply, fill #0

## 2022-09-07 MED ORDER — CEPHALEXIN 500 MG PO CAPS
500.0000 mg | ORAL_CAPSULE | Freq: Four times a day (QID) | ORAL | 0 refills | Status: AC
  Filled 2022-09-07: qty 28, 7d supply, fill #0

## 2022-09-12 ENCOUNTER — Ambulatory Visit (HOSPITAL_BASED_OUTPATIENT_CLINIC_OR_DEPARTMENT_OTHER): Payer: Medicare HMO

## 2022-09-12 DIAGNOSIS — Z0000 Encounter for general adult medical examination without abnormal findings: Secondary | ICD-10-CM

## 2022-09-13 LAB — COMPREHENSIVE METABOLIC PANEL
ALT: 49 IU/L — ABNORMAL HIGH (ref 0–44)
AST: 19 IU/L (ref 0–40)
Albumin/Globulin Ratio: 1.7 (ref 1.2–2.2)
Albumin: 4.5 g/dL (ref 3.8–4.9)
Alkaline Phosphatase: 65 IU/L (ref 44–121)
BUN/Creatinine Ratio: 22 — ABNORMAL HIGH (ref 9–20)
BUN: 21 mg/dL (ref 6–24)
Bilirubin Total: 0.8 mg/dL (ref 0.0–1.2)
CO2: 25 mmol/L (ref 20–29)
Calcium: 10.2 mg/dL (ref 8.7–10.2)
Chloride: 101 mmol/L (ref 96–106)
Creatinine, Ser: 0.94 mg/dL (ref 0.76–1.27)
Globulin, Total: 2.7 g/dL (ref 1.5–4.5)
Glucose: 89 mg/dL (ref 70–99)
Potassium: 4.8 mmol/L (ref 3.5–5.2)
Sodium: 140 mmol/L (ref 134–144)
Total Protein: 7.2 g/dL (ref 6.0–8.5)
eGFR: 93 mL/min/{1.73_m2} (ref >59)

## 2022-09-13 LAB — CBC WITH DIFFERENTIAL/PLATELET
Basophils Absolute: 0 10*3/uL (ref 0.0–0.2)
Basos: 1 %
EOS (ABSOLUTE): 0.1 10*3/uL (ref 0.0–0.4)
Eos: 1 %
Hematocrit: 46.5 % (ref 37.5–51.0)
Hemoglobin: 15.7 g/dL (ref 13.0–17.7)
Immature Grans (Abs): 0 10*3/uL (ref 0.0–0.1)
Immature Granulocytes: 0 %
Lymphocytes Absolute: 1.5 10*3/uL (ref 0.7–3.1)
Lymphs: 34 %
MCH: 32.1 pg (ref 26.6–33.0)
MCHC: 33.8 g/dL (ref 31.5–35.7)
MCV: 95 fL (ref 79–97)
Monocytes Absolute: 0.2 10*3/uL (ref 0.1–0.9)
Monocytes: 5 %
Neutrophils Absolute: 2.7 10*3/uL (ref 1.4–7.0)
Neutrophils: 59 %
Platelets: 138 10*3/uL — ABNORMAL LOW (ref 150–450)
RBC: 4.89 x10E6/uL (ref 4.14–5.80)
RDW: 12.4 % (ref 11.6–15.4)
WBC: 4.5 10*3/uL (ref 3.4–10.8)

## 2022-09-13 LAB — LIPID PANEL
Chol/HDL Ratio: 3.4 ratio (ref 0.0–5.0)
Cholesterol, Total: 198 mg/dL (ref 100–199)
HDL: 59 mg/dL (ref >39)
LDL Chol Calc (NIH): 126 mg/dL — ABNORMAL HIGH (ref 0–99)
Triglycerides: 70 mg/dL (ref 0–149)
VLDL Cholesterol Cal: 13 mg/dL (ref 5–40)

## 2022-09-13 LAB — TSH RFX ON ABNORMAL TO FREE T4: TSH: 1.46 u[IU]/mL (ref 0.450–4.500)

## 2022-09-13 LAB — HEMOGLOBIN A1C
Est. average glucose Bld gHb Est-mCnc: 103 mg/dL
Hgb A1c MFr Bld: 5.2 % (ref 4.8–5.6)

## 2022-09-14 ENCOUNTER — Other Ambulatory Visit (HOSPITAL_BASED_OUTPATIENT_CLINIC_OR_DEPARTMENT_OTHER): Payer: Self-pay

## 2022-09-19 ENCOUNTER — Ambulatory Visit (INDEPENDENT_AMBULATORY_CARE_PROVIDER_SITE_OTHER): Payer: Medicare HMO | Admitting: Family Medicine

## 2022-09-19 ENCOUNTER — Encounter (HOSPITAL_BASED_OUTPATIENT_CLINIC_OR_DEPARTMENT_OTHER): Payer: Self-pay | Admitting: Family Medicine

## 2022-09-19 VITALS — BP 152/83 | HR 55 | Temp 97.7°F | Ht 69.0 in | Wt 180.9 lb

## 2022-09-19 DIAGNOSIS — Z0000 Encounter for general adult medical examination without abnormal findings: Secondary | ICD-10-CM

## 2022-09-19 NOTE — Assessment & Plan Note (Signed)

## 2022-09-19 NOTE — Progress Notes (Signed)
Subjective:    CC: Annual Physical Exam  HPI:  [name] is a 57 y.o. presenting for annual physical  I reviewed the past medical history, family history, social history, surgical history, and allergies today and no changes were needed.  Please see the problem list section below in epic for further details.  Past Medical History: Past Medical History:  Diagnosis Date   Anemia    History of avascular necrosis of capital femoral epiphysis 2006, 2007   Bilateral Femoral Head   HLD (hyperlipidemia)    Pneumonia    twice   Psoriatic arthritis [facility])    Past Surgical History: Past Surgical History:  Procedure Laterality Date   JOINT REPLACEMENT Bilateral 2006, 2007   Necrosis femoral head (bilateral)   TOTAL HIP REVISION Right 11/23/2016   Procedure: Acetabulum liner and femoral head revision;  Surgeon: Ollen Gross, MD;  Location: [facility];  Service: Orthopedics;  Laterality: Right;   TOTAL HIP REVISION Left 04/10/2019   Procedure: Left hip bearing surface vs total hip arthroplasty revision;  Surgeon: Ollen Gross, MD;  Location: [facility];  Service: Orthopedics;  Laterality: Left;    Social History: Social History   Socioeconomic History   Marital status: Married    Spouse name: Not on file   Number of children: 1   Years of education: Not on file   Highest education level: Not on file  Occupational History   Occupation: Disabled  Tobacco Use   Smoking status: Never    Passive exposure: Never   Smokeless tobacco: Never  Vaping Use   Vaping Use: Never used  Substance and Sexual Activity   Alcohol use: Yes    Comment: 1 drinks per week   Drug use: No   Sexual activity: Not on file    Comment: Married  Other Topics Concern   Not on file  Social History Narrative   Lives at home w/ his wife   Right-handed   Caffeine: 2-3 cups per day   Social Determinants of Health   Financial Resource Strain: Not on file  Food Insecurity: Not on file  Transportation  Needs: Not on file  Physical Activity: Not on file  Stress: Not on file  Social Connections: Not on file   Family History: Family History  Problem Relation Age of Onset   Pneumonia Father    Tremor Neg Hx    Allergies: Allergies  Allergen Reactions   Gold-Containing Drug Products Rash    Denies oral or airway involvement - occurred in the 1980s.    Medications: See med rec.  Review of Systems: No headache, visual changes, nausea, vomiting, diarrhea, constipation, dizziness, abdominal pain, skin rash, fevers, chills, night sweats, swollen lymph nodes, weight loss, chest pain, body aches, joint swelling, muscle aches, shortness of breath, mood changes, visual or auditory hallucinations.  Objective:    BP (!) 152/83 (BP Location: Left Arm, Patient Position: Sitting, Cuff Size: Normal)   Pulse (!) 55   Temp 97.7 F (36.5 C) (Oral)   Ht 5\' 9"  (1.753 m)   Wt 180 lb 14.4 oz (82.1 kg)   SpO2 100%   BMI 26.71 kg/m   General: Well Developed, well nourished, and in no acute distress.  Neuro: Alert and oriented x3, extra-ocular muscles intact, sensation grossly intact. Cranial nerves II through XII are intact, motor, sensory, and coordinative functions are all intact. HEENT: Normocephalic, atraumatic, pupils equal round reactive to light, neck supple, no masses, no lymphadenopathy, thyroid nonpalpable. Oropharynx, nasopharynx,  external ear canals are unremarkable. Skin: Warm and dry, no rashes noted. Cardiac: Regular rate and rhythm, no murmurs rubs or gallops.  Respiratory: Clear to auscultation bilaterally. Not using accessory muscles, speaking in full sentences.  Abdominal: Soft, nontender, nondistended, positive bowel sounds, no masses, no organomegaly.  Musculoskeletal: Shoulder, elbow, wrist, hip, knee, ankle stable, and with full range of motion.  Impression and Recommendations:    Wellness examination Routine HCM labs reviewed. HCM reviewed/discussed. Anticipatory guidance  regarding healthy weight, lifestyle and choices given. Recommend healthy diet.  Recommend approximately 150 minutes/week of moderate intensity exercise Recommend regular dental and vision exams Always use seatbelt/lap and shoulder restraints Recommend using smoke alarms and checking batteries at least twice a year Recommend using sunscreen when outside Discussed colon cancer screening recommendations, options.  Patient is UTD Discussed immunization recommendations  Return in about 3 months (around 12/20/2022) for  BP and back pain.   ___________________________________________ Britanni Yarde de [place], MD, ABFM, CAQSM Primary Care and Sports Medicine [facility]

## 2022-09-20 DIAGNOSIS — R69 Illness, unspecified: Secondary | ICD-10-CM | POA: Diagnosis not present

## 2022-09-21 ENCOUNTER — Other Ambulatory Visit (HOSPITAL_BASED_OUTPATIENT_CLINIC_OR_DEPARTMENT_OTHER): Payer: Self-pay

## 2022-09-21 DIAGNOSIS — S6411XD Injury of median nerve at wrist and hand level of right arm, subsequent encounter: Secondary | ICD-10-CM | POA: Diagnosis not present

## 2022-09-21 DIAGNOSIS — G5601 Carpal tunnel syndrome, right upper limb: Secondary | ICD-10-CM | POA: Diagnosis not present

## 2022-09-22 ENCOUNTER — Other Ambulatory Visit: Payer: Self-pay

## 2022-09-22 ENCOUNTER — Other Ambulatory Visit (HOSPITAL_BASED_OUTPATIENT_CLINIC_OR_DEPARTMENT_OTHER): Payer: Self-pay

## 2022-10-05 DIAGNOSIS — G5601 Carpal tunnel syndrome, right upper limb: Secondary | ICD-10-CM | POA: Diagnosis not present

## 2022-10-05 DIAGNOSIS — S6411XD Injury of median nerve at wrist and hand level of right arm, subsequent encounter: Secondary | ICD-10-CM | POA: Diagnosis not present

## 2022-10-10 DIAGNOSIS — S6411XD Injury of median nerve at wrist and hand level of right arm, subsequent encounter: Secondary | ICD-10-CM | POA: Diagnosis not present

## 2022-10-10 DIAGNOSIS — G5601 Carpal tunnel syndrome, right upper limb: Secondary | ICD-10-CM | POA: Diagnosis not present

## 2022-10-31 DIAGNOSIS — S6411XD Injury of median nerve at wrist and hand level of right arm, subsequent encounter: Secondary | ICD-10-CM | POA: Diagnosis not present

## 2022-10-31 DIAGNOSIS — G5601 Carpal tunnel syndrome, right upper limb: Secondary | ICD-10-CM | POA: Diagnosis not present

## 2022-11-08 ENCOUNTER — Other Ambulatory Visit (HOSPITAL_BASED_OUTPATIENT_CLINIC_OR_DEPARTMENT_OTHER): Payer: Self-pay

## 2022-11-08 MED ORDER — AMOXICILLIN 500 MG PO CAPS
500.0000 mg | ORAL_CAPSULE | Freq: Four times a day (QID) | ORAL | 0 refills | Status: DC
  Filled 2022-11-08: qty 28, 7d supply, fill #0

## 2022-11-10 NOTE — Progress Notes (Signed)
Office Visit Note  Patient: [name]             Date of Birth: January 16, 1966           MRN: [id number]             PCP: de [place], Raymond J, MD Referring: de Peru, Raymond J, MD Visit Date: 11/24/2022 Occupation: @GUAROCC @  Subjective:  Joint stiffness  History of Present Illness: [name] is a 57 y.o. male with psoriatic arthritis, osteoarthritis and degenerative disc disease.  He returns today after his last visit in January 2024.  He states he has been going to the gym on a regular basis and watching his diet.  He had right carpal tunnel release in May 2024.  He still have some soreness at the incision site.  The paresthesias in his hands have improved.  He continues to have some discomfort in his lower back.  He states he was seen at [facility].  He has been experiencing some stiffness in his lower back.  He has not seen any joint swelling but he continues to have some stiffness in his hands and his feet.  He denies any history of Achilles tendinitis, Planter fasciitis, uveitis or sacroiliitis.  He denies any psoriasis lesions.    Activities of Daily Living:  Patient reports morning stiffness for 1-2 hours.   Patient Denies nocturnal pain.  Difficulty dressing/grooming: Denies Difficulty climbing stairs: Denies Difficulty getting out of chair: Denies Difficulty using hands for taps, buttons, cutlery, and/or writing: Denies  Review of Systems  Constitutional:  Positive for fatigue.  HENT:  Negative for mouth sores and mouth dryness.   Eyes:  Negative for dryness.  Respiratory:  Negative for shortness of breath.   Cardiovascular:  Negative for chest pain and palpitations.  Gastrointestinal:  Negative for blood in stool, constipation and diarrhea.  Endocrine: Negative for increased urination.  Genitourinary:  Negative for involuntary urination.  Musculoskeletal:  Positive for muscle weakness and morning stiffness. Negative for joint pain, gait problem, joint pain, joint  swelling, myalgias, muscle tenderness and myalgias.  Skin:  Negative for color change, rash and sensitivity to sunlight.  Allergic/Immunologic: Negative for susceptible to infections.  Neurological:  Negative for dizziness and headaches.  Hematological:  Negative for swollen glands.  Psychiatric/Behavioral:  Negative for depressed mood and sleep disturbance. The patient is not nervous/anxious.     PMFS History:  Patient Active Problem List   Diagnosis Date Noted   Wellness examination 09/19/2022   Low back pain 07/13/2022   Corns and callosities 01/15/2020   Hammer toes, bilateral 01/15/2020   Orthopnea 11/01/2019   History of COVID-19 07/30/2019   Acute deep vein thrombosis (DVT) of left lower extremity (HCC) 07/30/2019   Abnormal CT of the chest 07/30/2019   Cough 07/30/2019   Leg DVT (deep venous thromboembolism), acute, bilateral (HCC) 07/24/2019   HTN (hypertension) 07/24/2019   COVID-19 virus infection 07/20/2019   Acute respiratory failure with hypoxia (HCC) 07/20/2019   Acute pulmonary embolism (HCC) 07/19/2019   Essential tremor 04/03/2017   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/2016   High risk medications (not anticoagulants) long-term use 09/20/2016   Bilateral hand pain 09/20/2016   DDD (degenerative disc disease), cervical 09/20/2016   History of total hip replacement, bilateral 09/20/2016   DDD (degenerative disc disease), lumbar 09/20/2016   Bilateral plantar fasciitis 09/20/2016   Psoriasis 09/20/2016   Primary osteoarthritis of both feet 09/20/2016   Primary osteoarthritis of both hands 09/20/2016  Primary osteoarthritis of both knees 09/20/2016   Prediabetes 09/22/2014   Hyperlipidemia 09/22/2014   URI (upper respiratory infection) 07/07/2012   Acute bronchitis 07/07/2012   OBESITY 07/03/2008   AVASCULAR NECROSIS, FEMORAL HEAD 07/03/2008   Psoriatic arthritis (HCC) 05/02/2001    Past Medical History:  Diagnosis Date   Anemia    History of  avascular necrosis of capital femoral epiphysis 2006, 2007   Bilateral Femoral Head   HLD (hyperlipidemia)    Pneumonia    twice   Psoriatic arthritis (HCC)     Family History  Problem Relation Age of Onset   Pneumonia Father    Tremor Neg Hx    Past Surgical History:  Procedure Laterality Date   CARPAL TUNNEL RELEASE Right 08/2022   JOINT REPLACEMENT Bilateral 2006, 2007   Necrosis femoral head (bilateral)   TOTAL HIP REVISION Right 11/23/2016   Procedure: Acetabulum liner and femoral head revision;  Surgeon: Ollen Gross, MD;  Location: [facility];  Service: Orthopedics;  Laterality: Right;   TOTAL HIP REVISION Left 04/10/2019   Procedure: Left hip bearing surface vs total hip arthroplasty revision;  Surgeon: Ollen Gross, MD;  Location: [facility];  Service: Orthopedics;  Laterality: Left;    Social History   Social History Narrative   Lives at home w/ his wife   Right-handed   Caffeine: 2-3 cups per day   Immunization History  Administered Date(s) Administered   Influenza Inj Mdck Quad Pf 02/01/2019   Influenza,inj,Quad PF,6+ Mos 02/03/2017, 03/21/2018, 01/06/2021   Influenza,inj,quad, With Preservative 02/01/2019   Influenza-Unspecified 02/03/2017, 03/21/2018, 02/01/2019   PFIZER(Purple Top)SARS-COV-2 Vaccination 10/06/2019, 10/27/2019   Pneumococcal Conjugate-13 03/21/2018   Tdap 09/22/2014   Zoster Recombinant(Shingrix) 04/02/2018, 05/23/2018   Zoster, Live 04/02/2018, 05/23/2018     Objective: Vital Signs: BP (!) 142/80 (BP Location: Left Arm, Patient Position: Sitting, Cuff Size: Normal)   Pulse 69   Resp 15   Ht 5\' 9"  (1.753 m)   Wt 185 lb 9.6 oz (84.2 kg)   BMI 27.41 kg/m    Physical Exam Vitals and nursing note reviewed.  Constitutional:      Appearance: He is well-developed.  HENT:     Head: Normocephalic and atraumatic.  Eyes:     Conjunctiva/sclera: Conjunctivae normal.     Pupils: Pupils are equal, round, and reactive to light.   Cardiovascular:     Rate and Rhythm: Normal rate and regular rhythm.     Heart sounds: Normal heart sounds.  Pulmonary:     Effort: Pulmonary effort is normal.     Breath sounds: Normal breath sounds.  Abdominal:     General: Bowel sounds are normal.     Palpations: Abdomen is soft.  Musculoskeletal:     Cervical back: Normal range of motion and neck supple.  Skin:    General: Skin is warm and dry.     Capillary Refill: Capillary refill takes less than 2 seconds.  Neurological:     Mental Status: He is alert and oriented to person, place, and time.  Psychiatric:        Behavior: Behavior normal.      Musculoskeletal Exam: He had limited flexion extension, lateral rotation of the cervical spine.  Thoracic kyphosis was noted.  He had some discomfort range of motion of the lumbar spine.  No SI joint tenderness was noted.  Shoulder joints were in good range of motion.  He had bilateral elbow joint contractures with no synovitis.  Bilateral PIP and  DIP thickening with limited extension of the PIP joints and DIP joints was noted.  No synovitis was noted over MCPs PIPs or DIPs.  Hip joints were replaced and were in good range of motion without discomfort.  Knee joints in good range of motion without any warmth swelling or effusion.  There was no Achilles tendinitis of Planter fasciitis.  No tenderness over ankles or MTPs was noted.  CDAI Exam: CDAI Score: -- Patient Global: --; Provider Global: -- Swollen: --; Tender: -- Joint Exam 11/24/2022   No joint exam has been documented for this visit   There is currently no information documented on the homunculus. Go to the Rheumatology activity and complete the homunculus joint exam.  Investigation: No additional findings.  Imaging: No results found.  Recent Labs: Lab Results  Component Value Date   WBC 4.5 09/12/2022   HGB 15.7 09/12/2022   PLT 138 (L) 09/12/2022   NA 140 09/12/2022   K 4.8 09/12/2022   CL 101 09/12/2022   CO2  25 09/12/2022   GLUCOSE 89 09/12/2022   BUN 21 09/12/2022   CREATININE 0.94 09/12/2022   BILITOT 0.8 09/12/2022   ALKPHOS 65 09/12/2022   AST 19 09/12/2022   ALT 49 (H) 09/12/2022   PROT 7.2 09/12/2022   ALBUMIN 4.5 09/12/2022   CALCIUM 10.2 09/12/2022   GFRAA 94 09/16/2020   QFTBGOLDPLUS NEGATIVE 02/17/2021    Speciality Comments: No specialty comments available.  Procedures:  No procedures performed Allergies: Gold-containing drug products   Assessment / Plan:     Visit Diagnoses: Psoriatic arthritis (HCC)-patient denies any increased joint swelling.  He continues to have some stiffness.  No synovitis was noted on the examination today.  He denies any history of Achilles tendinitis of Planter fasciitis.  He has been off Enbrel since November 2022.  He stopped Enbrel due to recurrent upper respiratory tract infections.  He has had no flares since then.  I advised him to contact me if he develops any joint swelling.  No Planter fasciitis or Achilles tendinitis was noted.  He denies any episodes of iritis or uveitis.  Psoriasis-no psoriasis lesions were noted.  High risk medication use - (Enbrel 50 mg sq injections every 7 days. -Discontinued November 2022.)  He does not require immunosuppressive therapy at this time.  Primary osteoarthritis of both hands-bilateral PIP and DIP thickening with no synovitis.  Right hand paresthesia-improved after the carpal tunnel release.  History of total hip replacement, bilateral-good range of motion without discomfort.  Primary osteoarthritis of both knees-no warmth swelling or effusion was noted.  Primary osteoarthritis of both feet - he has hammertoes.  He  is followed by Dr. Stacie Acres at [facility].  DDD (degenerative disc disease), cervical-he had very limited lateral rotation flexion and extension without discomfort.  DDD (degenerative disc disease), lumbar-he has chronic lower back pain.  He is followed at [facility].  Other  medical problems are listed as follows:  History of hyperlipidemia  History of hypertension-blood pressure was elevated at 142/80.  He was advised to monitor blood pressure closely and follow-up with his PCP.  History of pulmonary embolism - After COVID-19 infection March 2021. He is no longer taking eliquis.  History of COVID-19 - March 2021.  Patient states he has had chronic cough since he had COVID-19 infection.  History of prediabetes  History of vitamin D deficiency  Other fatigue  Orders: No orders of the defined types were placed in this encounter.  No orders  of the defined types were placed in this encounter.   Follow-Up Instructions: Return in about 6 months (around 05/27/2023) for Psoriatic arthritis.   Pollyann Savoy, MD  Note - This record has been created using Animal nutritionist.  Chart creation errors have been sought, but may not always  have been located. Such creation errors do not reflect on  the standard of medical care.

## 2022-11-24 ENCOUNTER — Encounter: Payer: Self-pay | Admitting: Rheumatology

## 2022-11-24 ENCOUNTER — Ambulatory Visit: Payer: Medicare HMO | Attending: Rheumatology | Admitting: Rheumatology

## 2022-11-24 VITALS — BP 142/80 | HR 69 | Resp 15 | Ht 69.0 in | Wt 185.6 lb

## 2022-11-24 DIAGNOSIS — Z8679 Personal history of other diseases of the circulatory system: Secondary | ICD-10-CM

## 2022-11-24 DIAGNOSIS — L4050 Arthropathic psoriasis, unspecified: Secondary | ICD-10-CM

## 2022-11-24 DIAGNOSIS — M5030 Other cervical disc degeneration, unspecified cervical region: Secondary | ICD-10-CM

## 2022-11-24 DIAGNOSIS — R202 Paresthesia of skin: Secondary | ICD-10-CM | POA: Diagnosis not present

## 2022-11-24 DIAGNOSIS — Z79899 Other long term (current) drug therapy: Secondary | ICD-10-CM | POA: Diagnosis not present

## 2022-11-24 DIAGNOSIS — M19071 Primary osteoarthritis, right ankle and foot: Secondary | ICD-10-CM

## 2022-11-24 DIAGNOSIS — M19042 Primary osteoarthritis, left hand: Secondary | ICD-10-CM

## 2022-11-24 DIAGNOSIS — M19072 Primary osteoarthritis, left ankle and foot: Secondary | ICD-10-CM

## 2022-11-24 DIAGNOSIS — M19041 Primary osteoarthritis, right hand: Secondary | ICD-10-CM | POA: Diagnosis not present

## 2022-11-24 DIAGNOSIS — M5136 Other intervertebral disc degeneration, lumbar region: Secondary | ICD-10-CM | POA: Diagnosis not present

## 2022-11-24 DIAGNOSIS — L409 Psoriasis, unspecified: Secondary | ICD-10-CM | POA: Diagnosis not present

## 2022-11-24 DIAGNOSIS — Z96643 Presence of artificial hip joint, bilateral: Secondary | ICD-10-CM

## 2022-11-24 DIAGNOSIS — Z8639 Personal history of other endocrine, nutritional and metabolic disease: Secondary | ICD-10-CM

## 2022-11-24 DIAGNOSIS — Z87898 Personal history of other specified conditions: Secondary | ICD-10-CM

## 2022-11-24 DIAGNOSIS — Z8616 Personal history of COVID-19: Secondary | ICD-10-CM

## 2022-11-24 DIAGNOSIS — M170 Bilateral primary osteoarthritis of knee: Secondary | ICD-10-CM | POA: Diagnosis not present

## 2022-11-24 DIAGNOSIS — Z86711 Personal history of pulmonary embolism: Secondary | ICD-10-CM

## 2022-11-24 DIAGNOSIS — R5383 Other fatigue: Secondary | ICD-10-CM

## 2022-12-19 NOTE — Progress Notes (Unsigned)
Subjective:   [name] Oct 10, 1965 12/20/2022  No chief complaint on file.   HPI: [name] presents today for re-assessment and management of chronic medical conditions.   The following portions of the patient's history were reviewed and updated as appropriate: past medical history, past surgical history, family history, social history, allergies, medications, and problem list.   Patient Active Problem List   Diagnosis Date Noted   Wellness examination 09/19/2022   Low back pain 07/13/2022   Corns and callosities 01/15/2020   Hammer toes, bilateral 01/15/2020   Orthopnea 11/01/2019   History of COVID-19 07/30/2019   Acute deep vein thrombosis (DVT) of left lower extremity (HCC) 07/30/2019   Abnormal CT of the chest 07/30/2019   Cough 07/30/2019   Leg DVT (deep venous thromboembolism), acute, bilateral (HCC) 07/24/2019   HTN (hypertension) 07/24/2019   COVID-19 virus infection 07/20/2019   Acute respiratory failure with hypoxia (HCC) 07/20/2019   Acute pulmonary embolism (HCC) 07/19/2019   Essential tremor 04/03/2017   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/2016   High risk medications (not anticoagulants) long-term use 09/20/2016   Bilateral hand pain 09/20/2016   DDD (degenerative disc disease), cervical 09/20/2016   History of total hip replacement, bilateral 09/20/2016   DDD (degenerative disc disease), lumbar 09/20/2016   Bilateral plantar fasciitis 09/20/2016   Psoriasis 09/20/2016   Primary osteoarthritis of both feet 09/20/2016   Primary osteoarthritis of both hands 09/20/2016   Primary osteoarthritis of both knees 09/20/2016   Prediabetes 09/22/2014   Hyperlipidemia 09/22/2014   URI (upper respiratory infection) 07/07/2012   Acute bronchitis 07/07/2012   OBESITY 07/03/2008   AVASCULAR NECROSIS, FEMORAL HEAD 07/03/2008   Psoriatic arthritis (HCC) 05/02/2001   Past Medical History:  Diagnosis Date   Anemia    History of avascular necrosis  of capital femoral epiphysis 2006, 2007   Bilateral Femoral Head   HLD (hyperlipidemia)    Pneumonia    twice   Psoriatic arthritis [facility])    Past Surgical History:  Procedure Laterality Date   CARPAL TUNNEL RELEASE Right 08/2022   JOINT REPLACEMENT Bilateral 2006, 2007   Necrosis femoral head (bilateral)   TOTAL HIP REVISION Right 11/23/2016   Procedure: Acetabulum liner and femoral head revision;  Surgeon: Ollen Gross, MD;  Location: [facility];  Service: Orthopedics;  Laterality: Right;   TOTAL HIP REVISION Left 04/10/2019   Procedure: Left hip bearing surface vs total hip arthroplasty revision;  Surgeon: Ollen Gross, MD;  Location: [facility];  Service: Orthopedics;  Laterality: Left;    Family History  Problem Relation Age of Onset   Pneumonia Father    Tremor Neg Hx    Outpatient Medications Prior to Visit  Medication Sig Dispense Refill   acetaminophen (TYLENOL) 500 MG tablet Take 1,000 mg by mouth every 8 (eight) hours as needed for mild pain or moderate pain.      amoxicillin (AMOXIL) 500 MG capsule Take 1 capsule (500 mg total) by mouth 4 (four) times daily. (Patient not taking: Reported on 11/24/2022) 28 capsule 0   Ascorbic Acid (VITAMIN C PO) Take by mouth daily.     cetirizine (ZYRTEC) 10 MG tablet Take 10 mg by mouth daily.     cholecalciferol (VITAMIN D) 1000 units tablet Take 2,000 Units by mouth daily.      Coenzyme Q10 (CO Q 10 PO) Take 600 mg by mouth daily.     Glutathione POWD 500 g by Does not apply route daily. (  Patient not taking: Reported on 11/24/2022)  0   ibuprofen (ADVIL) 200 MG tablet Take 200 mg by mouth as needed.     Magnesium 250 MG TABS Take 250 mg by mouth daily.     Melatonin 5 MG CAPS as needed.     meloxicam (MOBIC) 15 MG tablet Take 1 tablet (15 mg total) by mouth daily with a meal (Patient not taking: Reported on 11/24/2022) 30 tablet 0   Multiple Vitamins-Minerals (IMMUNE SUPPORT PO) Take 1,000 mg by mouth daily. (Patient not taking:  Reported on 11/24/2022)     TURMERIC PO Take 1,000 mg by mouth daily.     No facility-administered medications prior to visit.   Allergies  Allergen Reactions   Gold-Containing Drug Products Rash    Denies oral or airway involvement - occurred in the 1980s.      ROS: A complete ROS was performed with pertinent positives/negatives noted in the HPI. The remainder of the ROS are negative.    Objective:   There were no vitals filed for this visit.  Physical Exam          GENERAL: Well-appearing, in NAD. Well nourished.  SKIN: Pink, warm and dry. No rash, lesion, ulceration, or ecchymoses.  Head: Normocephalic. NECK: Trachea midline. Full ROM w/o pain or tenderness. No lymphadenopathy.  EARS: Tympanic membranes are intact, translucent without bulging and without drainage. Appropriate landmarks visualized.  EYES: Conjunctiva clear without exudates. EOMI, PERRL, no drainage present.  NOSE: Septum midline w/o deformity. Nares patent, mucosa pink and non-inflamed w/o drainage. No sinus tenderness.  THROAT: Uvula midline. Oropharynx clear. Tonsils non-inflamed without exudate. Mucous membranes pink and moist.  RESPIRATORY: Chest wall symmetrical. Respirations even and non-labored. Breath sounds clear to auscultation bilaterally.  CARDIAC: S1, S2 present, regular rate and rhythm without murmur or gallops. Peripheral pulses 2+ bilaterally.  MSK: Muscle tone and strength appropriate for age. Joints w/o tenderness, redness, or swelling.  EXTREMITIES: Without clubbing, cyanosis, or edema.  NEUROLOGIC: No motor or sensory deficits. Steady, even gait. C2-C12 intact.  PSYCH/MENTAL STATUS: Alert, oriented x 3. Cooperative, appropriate mood and affect.   Health Maintenance Due  Topic Date Due   Medicare Annual Wellness (AWV)  Never done   COVID-19 Vaccine (3 - Pfizer risk series) 11/24/2019   INFLUENZA VACCINE  12/01/2022    No results found for any visits on 12/20/22.  The 10-year ASCVD  risk score (Arnett DK, et al., 2019) is: 8.3%     Assessment & Plan:  *** There are no diagnoses linked to this encounter.  No orders of the defined types were placed in this encounter.  Lab Orders  No laboratory test(s) ordered today   No images are attached to the encounter or orders placed in the encounter.  No follow-ups on file.    Patient to reach out to office if new, worrisome, or unresolved symptoms arise or if no improvement in patient's condition. Patient verbalized understanding and is agreeable to treatment plan. All questions answered to patient's satisfaction.   Of note, portions of this note may have been created with voice recognition software Physicist, medical). While this note has been edited for accuracy, occasional wrong-word or 'sound-a-like' substitutions may have occurred due to the inherent limitations of voice recognition software.  Yolanda Manges, FNP

## 2022-12-20 ENCOUNTER — Encounter (HOSPITAL_BASED_OUTPATIENT_CLINIC_OR_DEPARTMENT_OTHER): Payer: Self-pay | Admitting: Family Medicine

## 2022-12-20 ENCOUNTER — Other Ambulatory Visit (HOSPITAL_BASED_OUTPATIENT_CLINIC_OR_DEPARTMENT_OTHER): Payer: Self-pay

## 2022-12-20 ENCOUNTER — Ambulatory Visit (INDEPENDENT_AMBULATORY_CARE_PROVIDER_SITE_OTHER): Payer: Medicare HMO | Admitting: Family Medicine

## 2022-12-20 VITALS — BP 151/81 | HR 58 | Ht 69.0 in | Wt 186.0 lb

## 2022-12-20 DIAGNOSIS — I10 Essential (primary) hypertension: Secondary | ICD-10-CM

## 2022-12-20 DIAGNOSIS — M5136 Other intervertebral disc degeneration, lumbar region: Secondary | ICD-10-CM | POA: Diagnosis not present

## 2022-12-20 MED ORDER — LOSARTAN POTASSIUM 25 MG PO TABS
25.0000 mg | ORAL_TABLET | Freq: Every day | ORAL | 1 refills | Status: DC
  Filled 2022-12-20: qty 60, 60d supply, fill #0
  Filled 2023-02-16: qty 60, 60d supply, fill #1

## 2022-12-28 ENCOUNTER — Other Ambulatory Visit (HOSPITAL_BASED_OUTPATIENT_CLINIC_OR_DEPARTMENT_OTHER): Payer: Self-pay

## 2022-12-28 MED ORDER — AMOXICILLIN 500 MG PO CAPS
2000.0000 mg | ORAL_CAPSULE | ORAL | 2 refills | Status: AC
  Filled 2022-12-28: qty 12, 3d supply, fill #0
  Filled 2023-02-16: qty 12, 3d supply, fill #1
  Filled 2023-09-05: qty 12, 3d supply, fill #2

## 2022-12-30 ENCOUNTER — Other Ambulatory Visit (HOSPITAL_BASED_OUTPATIENT_CLINIC_OR_DEPARTMENT_OTHER): Payer: Self-pay

## 2023-01-12 ENCOUNTER — Other Ambulatory Visit (HOSPITAL_BASED_OUTPATIENT_CLINIC_OR_DEPARTMENT_OTHER): Payer: Self-pay

## 2023-01-17 ENCOUNTER — Ambulatory Visit (INDEPENDENT_AMBULATORY_CARE_PROVIDER_SITE_OTHER): Payer: Medicare HMO | Admitting: Family Medicine

## 2023-01-17 ENCOUNTER — Encounter (HOSPITAL_BASED_OUTPATIENT_CLINIC_OR_DEPARTMENT_OTHER): Payer: Self-pay | Admitting: Family Medicine

## 2023-01-17 VITALS — BP 124/78 | HR 62 | Ht 69.0 in | Wt 187.2 lb

## 2023-01-17 DIAGNOSIS — Z23 Encounter for immunization: Secondary | ICD-10-CM | POA: Diagnosis not present

## 2023-01-17 DIAGNOSIS — I10 Essential (primary) hypertension: Secondary | ICD-10-CM

## 2023-01-17 NOTE — Progress Notes (Signed)
    Procedures performed today:    None.  Independent interpretation of notes and tests performed by another provider:   None.  Brief History, Exam, Impression, and Recommendations:    BP (!) 145/79 (BP Location: Left Arm, Patient Position: Sitting, Cuff Size: Normal)   Pulse 62   Ht 5\' 9"  (1.753 m)   Wt 187 lb 3.2 oz (84.9 kg)   SpO2 100%   BMI 27.64 kg/m   Encounter for immunization -     Flu vaccine trivalent PF, 6mos and older(Flulaval,Afluria,Fluarix,Fluzone)  Essential hypertension Assessment & Plan: Blood pressure borderline in office today, slightly improved on recheck.  He continues with losartan 25 mg as prescribed.  Denies any issues with side effects.  Denies any chest pain, headaches, lightheadedness or dizziness.  He has been exercising regularly, primarily utilizing elliptical, has been walking more with cooler weather. Discussed options today, given borderline control, can continue with current regimen, continue to work on lifestyle modifications.  Reviewed DASH diet, handout provided Plan for close follow-up in about 2 months to monitor progress, recheck blood pressure.  If continuing to be borderline or elevated, consider titration of losartan to 50 mg daily.  Recommend intermittent monitoring of blood pressure at home   Return in about 2 months (around 03/19/2023) for hypertension.   ___________________________________________ [name] de [place], MD, ABFM, CAQSM Primary Care and Sports Medicine [facility]

## 2023-01-17 NOTE — Assessment & Plan Note (Signed)
Blood pressure borderline in office today, slightly improved on recheck.  He continues with losartan 25 mg as prescribed.  Denies any issues with side effects.  Denies any chest pain, headaches, lightheadedness or dizziness.  He has been exercising regularly, primarily utilizing elliptical, has been walking more with cooler weather. Discussed options today, given borderline control, can continue with current regimen, continue to work on lifestyle modifications.  Reviewed DASH diet, handout provided Plan for close follow-up in about 2 months to monitor progress, recheck blood pressure.  If continuing to be borderline or elevated, consider titration of losartan to 50 mg daily.  Recommend intermittent monitoring of blood pressure at home

## 2023-01-26 ENCOUNTER — Ambulatory Visit (INDEPENDENT_AMBULATORY_CARE_PROVIDER_SITE_OTHER): Payer: Medicare HMO | Admitting: Family Medicine

## 2023-01-26 ENCOUNTER — Encounter (HOSPITAL_BASED_OUTPATIENT_CLINIC_OR_DEPARTMENT_OTHER): Payer: Self-pay | Admitting: Family Medicine

## 2023-01-26 VITALS — BP 132/78 | HR 60 | Ht 69.0 in | Wt 186.1 lb

## 2023-01-26 DIAGNOSIS — R0789 Other chest pain: Secondary | ICD-10-CM | POA: Diagnosis not present

## 2023-01-26 DIAGNOSIS — R079 Chest pain, unspecified: Secondary | ICD-10-CM | POA: Diagnosis not present

## 2023-01-26 NOTE — Progress Notes (Signed)
    Procedures performed today:    None.  Independent interpretation of notes and tests performed by another provider:   None.  Brief History, Exam, Impression, and Recommendations:    BP 132/78 (BP Location: Left Arm, Patient Position: Sitting, Cuff Size: Normal)   Pulse 60   Ht 5\' 9"  (1.753 m)   Wt 186 lb 1.6 oz (84.4 kg)   SpO2 99%   BMI 27.48 kg/m   Chest pain, unspecified type -     EKG 12-Lead  Chest discomfort Assessment & Plan: Patient reports that he was exercising on the elliptical when he noticed some chest discomfort centrally, although denies that he was having "pain" and that it does not quite feel painful but does have some discomfort noted.  This has affected patient previously, however he feels that it has become slightly more frequent.  Has not had any significant change in severity.  Symptoms have been noticed with doing the elliptical, occasionally with walking when swinging arms.  With the elliptical, has not noted symptoms when keeping arms off handlebars.  He has not had any associated shortness of breath, trouble breathing, feeling lightheaded or dizzy. On exam, patient is in no acute distress, vital signs stable.  Cardiovascular exam with regular rate and rhythm, no murmur appreciated.  Lungs clear to auscultation bilaterally.  Does not have reproducible pain on palpation of anterior chest wall Fila symptoms are currently atypical for cardiovascular cause of chest pain.  More inclined to suspect musculoskeletal etiology. Will proceed with an EKG today.  EKG reveals sinus bradycardia, normal QT interval, no T wave abnormality. We can also proceed with referral to cardiology.  I do feel that cardiac etiology is less likely, however would be reasonable to exclude cardiac etiology given progressive symptoms, worsening with exertion.   Other orders -     EKG 12-Lead  Return if symptoms worsen or fail to  improve.   ___________________________________________ Lachlan Pelto de [place], MD, ABFM, CAQSM Primary Care and Sports Medicine [facility]

## 2023-01-26 NOTE — Assessment & Plan Note (Signed)
Patient reports that he was exercising on the elliptical when he noticed some chest discomfort centrally, although denies that he was having "pain" and that it does not quite feel painful but does have some discomfort noted.  This has affected patient previously, however he feels that it has become slightly more frequent.  Has not had any significant change in severity.  Symptoms have been noticed with doing the elliptical, occasionally with walking when swinging arms.  With the elliptical, has not noted symptoms when keeping arms off handlebars.  He has not had any associated shortness of breath, trouble breathing, feeling lightheaded or dizzy. On exam, patient is in no acute distress, vital signs stable.  Cardiovascular exam with regular rate and rhythm, no murmur appreciated.  Lungs clear to auscultation bilaterally.  Does not have reproducible pain on palpation of anterior chest wall Fila symptoms are currently atypical for cardiovascular cause of chest pain.  More inclined to suspect musculoskeletal etiology. Will proceed with an EKG today.  EKG reveals sinus bradycardia, normal QT interval, no T wave abnormality. We can also proceed with referral to cardiology.  I do feel that cardiac etiology is less likely, however would be reasonable to exclude cardiac etiology given progressive symptoms, worsening with exertion.

## 2023-01-28 NOTE — Progress Notes (Unsigned)
Cardiology Clinic Note   Date: 01/28/2023 ID: Kelven, Deets [birth year], MRN [id number]  Primary Cardiologist:  Parke Poisson, MD  Patient Profile    [name] is a 57 y.o. male who presents to the clinic today for ***    Past medical history significant for: Lower extremity edema. Echo 11/25/2019: EF 65 to 70%.  No RWMA.  Mild LVH.  Normal diastolic parameters.  Normal RV function.  No significant valvular abnormalities. Hypertension. Hyperlipidemia. Lipid panel 09/12/2022: LDL 126, HDL 59, TG 70, total 198. PE/DVT. Vascular ultrasound lower extremities 07/20/2019: No evidence of common femoral vein obstruction on the right.  Findings consistent with acute DVT involving the left femoral vein, left popliteal vein, left posterior tibial veins, and left peroneal veins. Psoriatic arthritis. Avascular necrosis of femoral head bilaterally.     History of Present Illness    [name] was first evaluated by Dr. Jacques Navy on 11/26/2019 for leg swelling and bradycardia.  Patient had undergone hospital admission in March 2021 for COVID-19 infection and was found to have bilateral PE with RV strain and was started on Eliquis.  At the time of his visit he reported lower extremity edema occurring after COVID-19 hospitalization.  Patient was instructed to wear compression socks.  Echo from the day prior was reviewed with no concerning findings.  Patient was last seen in the office by Dr. Jacques Navy on 06/05/2020 for routine follow-up.  He was doing well at that time.  Eliquis was continued per patient preference.  Hematology/rheumatology review recommended to determine risk factors for continuation.  Patient was seen by PCP, [name], on 01/26/2023.  At that time he complained of central chest discomfort while exercising on the elliptical.  He reported similar pain previously but becoming more frequent.  EKG without acute abnormalities, per [name].  He was referred to  cardiology.  Today, patient ***  Chest pain.  Patient*** Hypertension. BP today *** Patient denies headaches, dizziness or vision changes. Continue losartan. Hyperlipidemia.  LDL May 2024 126, not at goal.  Patient is not on statin therapy.   ROS: All other systems reviewed and are otherwise negative except as noted in History of Present Illness.  Studies Reviewed       ***  Risk Assessment/Calculations    {Does this patient have ATRIAL FIBRILLATION?[phone]} No BP recorded.  {Refresh Note OR Click here to enter BP  :1}***        Physical Exam    VS:  There were no vitals taken for this visit. , BMI There is no height or weight on file to calculate BMI.  GEN: Well nourished, well developed, in no acute distress. Neck: No JVD or carotid bruits. Cardiac: *** RRR. No murmurs. No rubs or gallops.   Respiratory:  Respirations regular and unlabored. Clear to auscultation without rales, wheezing or rhonchi. GI: Soft, nontender, nondistended. Extremities: Radials/DP/PT 2+ and equal bilaterally. No clubbing or cyanosis. No edema ***  Skin: Warm and dry, no rash. Neuro: Strength intact.  Assessment & Plan   ***  Disposition: ***     {Are you ordering a CV Procedure (e.g. stress test, cath, DCCV, TEE, etc)?   Press F2        :[id number]}   Signed, Etta Grandchild. Saleemah Mollenhauer, DNP, NP-C

## 2023-01-31 ENCOUNTER — Ambulatory Visit: Payer: Medicare HMO | Attending: Student | Admitting: Student

## 2023-01-31 ENCOUNTER — Encounter: Payer: Self-pay | Admitting: Student

## 2023-01-31 ENCOUNTER — Other Ambulatory Visit (HOSPITAL_BASED_OUTPATIENT_CLINIC_OR_DEPARTMENT_OTHER): Payer: Self-pay

## 2023-01-31 VITALS — BP 136/78 | HR 61 | Ht 69.0 in | Wt 189.6 lb

## 2023-01-31 DIAGNOSIS — I10 Essential (primary) hypertension: Secondary | ICD-10-CM

## 2023-01-31 DIAGNOSIS — E7800 Pure hypercholesterolemia, unspecified: Secondary | ICD-10-CM | POA: Diagnosis not present

## 2023-01-31 DIAGNOSIS — R0789 Other chest pain: Secondary | ICD-10-CM

## 2023-01-31 MED ORDER — METOPROLOL TARTRATE 25 MG PO TABS
25.0000 mg | ORAL_TABLET | ORAL | 0 refills | Status: DC
  Filled 2023-01-31: qty 1, 1d supply, fill #0

## 2023-01-31 NOTE — Patient Instructions (Signed)
Medication Instructions:  Take Metoprolol Succinate 25 MG 1 hour prior to CTA *If you need a refill on your cardiac medications before your next appointment, please call your pharmacy*   Lab Work: Bmet and CBC will be drawn today If you have labs (blood work) drawn today and your tests are completely normal, you will receive your results only by: MyChart Message (if you have MyChart) OR A paper copy in the mail If you have any lab test that is abnormal or we need to change your treatment, we will call you to review the results.   Testing/Procedures:    Your cardiac CT will be scheduled at one of the below locations:   [facility] [address] [place], [place] [address] [phone]  OR  [facility] [address] [place], [place] [address] [phone]  OR   [facility] [address] [place], [place] [address] [phone]  If scheduled at [facility], please arrive at the [facility] and Children's Entrance (Entrance C2) of [facility] 30 minutes prior to test start time. You can use the FREE valet parking offered at entrance C (encouraged to control the heart rate for the test)  Proceed to the [facility] Radiology Department (first floor) to check-in and test prep.  All radiology patients and guests should use entrance C2 at [facility], accessed from [facility], even though the hospital's physical address listed is [address].    If scheduled at [facility] or [facility], please arrive 15 mins early for check-in and test prep.  There is spacious parking and easy access to the radiology department from the [facility] Heart and Vascular entrance. Please enter here and check-in with the desk attendant.   Please follow these instructions carefully (unless otherwise directed):  An IV will be  required for this test and Nitroglycerin will be given.  Hold all erectile dysfunction medications at least 3 days (72 hrs) prior to test. (Ie viagra, cialis, sildenafil, tadalafil, etc)   On the Night Before the Test: Be sure to Drink plenty of water. Do not consume any caffeinated/decaffeinated beverages or chocolate 12 hours prior to your test. Do not take any antihistamines 12 hours prior to your test.  On the Day of the Test: Drink plenty of water until 1 hour prior to the test. Do not eat any food 1 hour prior to test. You may take your regular medications prior to the test.  Take metoprolol (Lopressor) two hours prior to test.      After the Test: Drink plenty of water. After receiving IV contrast, you may experience a mild flushed feeling. This is normal. On occasion, you may experience a mild rash up to 24 hours after the test. This is not dangerous. If this occurs, you can take Benadryl 25 mg and increase your fluid intake. If you experience trouble breathing, this can be serious. If it is severe call 911 IMMEDIATELY. If it is mild, please call our office. If you take any of these medications: Glipizide/Metformin, Avandament, Glucavance, please do not take 48 hours after completing test unless otherwise instructed.  We will call to schedule your test 2-4 weeks out understanding that some insurance companies will need an authorization prior to the service being performed.   For more information and frequently asked questions, please visit our website : [email or web address]  For non-scheduling related questions, please contact the  cardiac imaging nurse navigator should you have any questions/concerns: Cardiac Imaging Nurse Navigators Direct Office Dial: [phone]   For scheduling needs, including cancellations and rescheduling, please call [place], [phone].    Follow-Up: At [facility], you and your health needs are our priority.  As part of  our continuing mission to provide you with exceptional heart care, we have created designated Provider Care Teams.  These Care Teams include your primary Cardiologist (physician) and Advanced Practice Providers (APPs -  Physician Assistants and Nurse Practitioners) who all work together to provide you with the care you need, when you need it.  We recommend signing up for the patient portal called "MyChart".  Sign up information is provided on this After Visit Summary.    MyChart is used to connect with patients for Virtual Visits (Telemedicine).  Patients are able to view lab/test results, encounter notes, upcoming appointments, etc.  Non-urgent messages can be sent to your provider as well.   To learn more about what you can do with MyChart, go to [email or web address].    Your next appointment:   6 week(s)  Provider:   With any APP

## 2023-02-01 LAB — BASIC METABOLIC PANEL
BUN/Creatinine Ratio: 23 — ABNORMAL HIGH (ref 9–20)
BUN: 21 mg/dL (ref 6–24)
CO2: 28 mmol/L (ref 20–29)
Calcium: 9.6 mg/dL (ref 8.7–10.2)
Chloride: 104 mmol/L (ref 96–106)
Creatinine, Ser: 0.92 mg/dL (ref 0.76–1.27)
Glucose: 85 mg/dL (ref 70–99)
Potassium: 4.9 mmol/L (ref 3.5–5.2)
Sodium: 142 mmol/L (ref 134–144)
eGFR: 96 mL/min/{1.73_m2} (ref >59)

## 2023-02-01 LAB — CBC
Hematocrit: 44.8 % (ref 37.5–51.0)
Hemoglobin: 14.9 g/dL (ref 13.0–17.7)
MCH: 32.2 pg (ref 26.6–33.0)
MCHC: 33.3 g/dL (ref 31.5–35.7)
MCV: 97 fL (ref 79–97)
Platelets: 124 10*3/uL — ABNORMAL LOW (ref 150–450)
RBC: 4.63 x10E6/uL (ref 4.14–5.80)
RDW: 12.2 % (ref 11.6–15.4)
WBC: 4.4 10*3/uL (ref 3.4–10.8)

## 2023-02-06 ENCOUNTER — Encounter (HOSPITAL_COMMUNITY): Payer: Self-pay

## 2023-02-09 ENCOUNTER — Ambulatory Visit (HOSPITAL_BASED_OUTPATIENT_CLINIC_OR_DEPARTMENT_OTHER)
Admission: RE | Admit: 2023-02-09 | Discharge: 2023-02-09 | Disposition: A | Discharge status: Home / Self Care | Payer: Medicare HMO | Source: Ambulatory Visit | Attending: Internal Medicine | Admitting: Internal Medicine

## 2023-02-09 ENCOUNTER — Ambulatory Visit (HOSPITAL_COMMUNITY)
Admission: RE | Admit: 2023-02-09 | Discharge: 2023-02-09 | Disposition: A | Discharge status: Home / Self Care | Payer: Medicare HMO | Source: Ambulatory Visit | Attending: Student | Admitting: Student

## 2023-02-09 ENCOUNTER — Ambulatory Visit (HOSPITAL_COMMUNITY): Payer: Medicare HMO

## 2023-02-09 ENCOUNTER — Other Ambulatory Visit: Payer: Self-pay | Admitting: Internal Medicine

## 2023-02-09 DIAGNOSIS — I2510 Atherosclerotic heart disease of native coronary artery without angina pectoris: Secondary | ICD-10-CM | POA: Insufficient documentation

## 2023-02-09 DIAGNOSIS — R931 Abnormal findings on diagnostic imaging of heart and coronary circulation: Secondary | ICD-10-CM

## 2023-02-09 DIAGNOSIS — R0789 Other chest pain: Secondary | ICD-10-CM | POA: Diagnosis present

## 2023-02-09 MED ORDER — NITROGLYCERIN 0.4 MG SL SUBL
SUBLINGUAL_TABLET | SUBLINGUAL | Status: AC
  Filled 2023-02-09: qty 2

## 2023-02-09 MED ORDER — NITROGLYCERIN 0.4 MG SL SUBL
0.8000 mg | SUBLINGUAL_TABLET | Freq: Once | SUBLINGUAL | Status: AC
  Administered 2023-02-09: 0.8 mg via SUBLINGUAL

## 2023-02-09 MED ORDER — IOHEXOL 350 MG/ML SOLN
95.0000 mL | Freq: Once | INTRAVENOUS | Status: AC | PRN
  Administered 2023-02-09: 95 mL via INTRAVENOUS

## 2023-02-15 ENCOUNTER — Encounter: Payer: Self-pay | Admitting: Internal Medicine

## 2023-02-16 ENCOUNTER — Other Ambulatory Visit (HOSPITAL_BASED_OUTPATIENT_CLINIC_OR_DEPARTMENT_OTHER): Payer: Self-pay

## 2023-02-16 ENCOUNTER — Other Ambulatory Visit: Payer: Self-pay

## 2023-02-16 DIAGNOSIS — H5212 Myopia, left eye: Secondary | ICD-10-CM | POA: Diagnosis not present

## 2023-02-16 MED ORDER — AMLODIPINE BESYLATE 2.5 MG PO TABS
2.5000 mg | ORAL_TABLET | Freq: Every day | ORAL | 3 refills | Status: AC
Start: 2023-02-16 — End: 2024-03-24
  Filled 2023-02-16: qty 100, 100d supply, fill #0
  Filled 2023-05-25: qty 100, 100d supply, fill #1
  Filled 2023-09-05: qty 100, 100d supply, fill #2
  Filled 2023-12-14: qty 100, 100d supply, fill #3

## 2023-02-21 ENCOUNTER — Telehealth (HOSPITAL_BASED_OUTPATIENT_CLINIC_OR_DEPARTMENT_OTHER): Payer: Self-pay | Admitting: *Deleted

## 2023-02-21 NOTE — Telephone Encounter (Signed)
Left message for pt to call back to get scheduled with Dr. Jacques Navy.

## 2023-02-21 NOTE — Telephone Encounter (Signed)
Called pt to schedule medicare wellness visit. Pt declined at this time.

## 2023-02-28 ENCOUNTER — Encounter: Payer: Self-pay | Admitting: Internal Medicine

## 2023-02-28 ENCOUNTER — Ambulatory Visit: Payer: Medicare HMO | Attending: Internal Medicine | Admitting: Internal Medicine

## 2023-02-28 ENCOUNTER — Other Ambulatory Visit: Payer: Self-pay | Admitting: *Deleted

## 2023-02-28 VITALS — BP 130/62 | HR 71 | Ht 69.0 in | Wt 190.0 lb

## 2023-02-28 DIAGNOSIS — I25118 Atherosclerotic heart disease of native coronary artery with other forms of angina pectoris: Secondary | ICD-10-CM

## 2023-02-28 DIAGNOSIS — R0789 Other chest pain: Secondary | ICD-10-CM | POA: Diagnosis not present

## 2023-02-28 DIAGNOSIS — E785 Hyperlipidemia, unspecified: Secondary | ICD-10-CM

## 2023-02-28 DIAGNOSIS — E7800 Pure hypercholesterolemia, unspecified: Secondary | ICD-10-CM

## 2023-02-28 DIAGNOSIS — R931 Abnormal findings on diagnostic imaging of heart and coronary circulation: Secondary | ICD-10-CM

## 2023-02-28 DIAGNOSIS — I10 Essential (primary) hypertension: Secondary | ICD-10-CM | POA: Diagnosis not present

## 2023-02-28 NOTE — Patient Instructions (Addendum)
Medication Instructions:  Your physician recommends that you continue on your current medications as directed. Please refer to the Current Medication list given to you today.  *If you need a refill on your cardiac medications before your next appointment, please call your pharmacy*   Testing/Procedures:   ALIM MCFARLAND  02/28/2023  You are scheduled for a Cardiac Catheterization on Wednesday, November 13 with Dr. Verne Carrow.  1. Please arrive at the [facility] (Main Entrance A) at [facility]: [address] [place], [place] [address] at 6:30am (This time is 2 hour(s) before your procedure to ensure your preparation). Free valet parking service is available. You will check in at ADMITTING. The support person will be asked to wait in the waiting room.  It is OK to have someone drop you off and come back when you are ready to be discharged.    Special note: Every effort is made to have your procedure done on time. Please understand that emergencies sometimes delay scheduled procedures.  2. Diet: Do not eat solid foods after midnight.  The patient may have clear liquids until 5am upon the day of the procedure.  3. Labs: CBC and BMET today.   4. Medication instructions in preparation for your procedure:   Contrast Allergy: No   On the morning of your procedure, take your Aspirin 81 mg and any morning medicines NOT listed above.  You may use sips of water.  5. Plan to go home the same day, you will only stay overnight if medically necessary. 6. Bring a current list of your medications and current insurance cards. 7. You MUST have a responsible person to drive you home. 8. Someone MUST be with you the first 24 hours after you arrive home or your discharge will be delayed. 9. Please wear clothes that are easy to get on and off and wear slip-on shoes.  Thank you for allowing [place] to care for you!   -- [facility]   Follow-Up: At [facility], you and your health needs are our priority.  As part of our continuing mission to provide you with exceptional heart care, we have created designated Provider Care Teams.  These Care Teams include your primary Cardiologist (physician) and Advanced Practice Providers (APPs -  Physician Assistants and Nurse Practitioners) who all work together to provide you with the care you need, when you need it.   Your next appointment:   2 week(s) after procedure  Provider:   Parke Poisson, MD  or APP  Other Instructions: We have referred you to the Pharmacist for Lipid level management.

## 2023-02-28 NOTE — Progress Notes (Unsigned)
Cardiology Office Note:  .   Date:  02/28/2023  ID:  [name] 1965-08-01, MRN [id number] PCP: de [place], Raymond J, MD  [facility] Providers Cardiologist:  Parke Poisson, MD    History of Present Illness: .   [name] is a 57 y.o. male.  Discussed the use of AI scribe software for clinical note transcription with the patient, who gave verbal consent to proceed.  History of Present Illness   The patient, a 57 year old with a history of hypertension and a previous blood clot, presents with chest discomfort that has been occurring for a couple of months. Initially, the discomfort was noticed during exercise, specifically when using the elliptical machine, and would cease upon stopping the exercise. However, the discomfort has since been occurring at rest. The patient describes the discomfort as feeling like heartburn and it has been moving around, initially in the center of the chest and now more on the right side. The patient's spouse reports that the patient has episodes of bradycardia and suspects sleep apnea. Despite significant dietary changes, weight loss, and regular exercise, the patient's cholesterol levels remain elevated.        ROS: negative except per HPI above.  Studies Reviewed: [name]   EKG Interpretation Date/Time:  Tuesday February 28 2023 12:30:49 EDT Ventricular Rate:  71 PR Interval:  160 QRS Duration:  96 QT Interval:  374 QTC Calculation: 406 R Axis:   -15  Text Interpretation: Normal sinus rhythm Normal ECG When compared with ECG of 31-Jan-2023 09:07, No significant change was found Confirmed by Weston Brass ([id number]) on 03/02/2023 4:21:02 PM    Results   LABS Total cholesterol: 198 mg/dL (62/1308) HDL: 59 mg/dL (65/7846) LDL: 962 mg/dL (95/2841) Triglycerides: 70 mg/dL (32/4401) Complete blood count: Normal Renal function: Normal Electrolytes: Normal Hepatic function: Normal Platelet count: Slightly low  RADIOLOGY CT scan:  Mild blockage in most coronary arteries (approximately 25-49% stenosis). Severe stenosis suspected in diagonal branch left anterior descending artery. Extensive plaque burden with 87th percentile plaque volume. Calcium score 488, 92nd percentile.     Risk Assessment/Calculations:           Physical Exam:   VS:  BP 130/62 (BP Location: Left Arm, Patient Position: Sitting, Cuff Size: Normal)   Pulse 71   Ht 5\' 9"  (1.753 m)   Wt 190 lb (86.2 kg)   BMI 28.06 kg/m    Wt Readings from Last 3 Encounters:  02/28/23 190 lb (86.2 kg)  01/31/23 189 lb 9.6 oz (86 kg)  01/26/23 186 lb 1.6 oz (84.4 kg)     Physical Exam   VITALS: BP mildly elevated CARDIOVASCULAR: Normal heart sounds, no murmurs, rubs, or gallops. Carotid arteries without bruits.     GEN: Well nourished, well developed in no acute distress NECK: No JVD; No carotid bruits CARDIAC: RRR, no murmurs, rubs, gallops RESPIRATORY:  Clear to auscultation without rales, wheezing or rhonchi  ABDOMEN: Soft, non-tender, non-distended EXTREMITIES:  No edema; No deformity   ASSESSMENT AND PLAN: .    1. Chest discomfort   2. Coronary artery disease of native artery of native heart with stable angina pectoris (HCC)   3. Abnormal findings diagnostic imaging of heart and coronary circulation   4. Essential hypertension   5. Hyperlipidemia, unspecified hyperlipidemia type     Assessment and Plan    Coronary Artery Disease Chest discomfort described as burn, particularly with exertion. CT scan suggestive of a severe blockage in the diagonal  artery, 3 mm vessel by CT. Discussed the risks/benefits of heart catheterization and potential stenting for symptom relief. -Plan for heart catheterization to further evaluate the blockage. We discussed that med titration and strategy of observation are also appropriate, however patient and wife prefer confirmation of diagonal lesion and evaluation for PCI and sx mgmt.   Hyperlipidemia LDL cholesterol  of 126, above the target of less than 70 for patients with coronary artery disease. Discussed the benefits of statin therapy and PCSK9 inhibitors in reducing LDL cholesterol and potentially reducing plaque volume. -Refer to pharmacist lipid clinic for consideration of PCSK9 inhibitor therapy.  Hypertension Currently managed on low doses of losartan and amlodipine. Blood pressure control may have contributed to improvement in chest discomfort. -Continue current antihypertensive regimen.  Possible Sleep Apnea Discussed potential contribution to hypertension and overall cardiovascular risk. -Refer to pulmonary office for sleep study evaluation.  Follow-up Plan to see patient back approximately one week after heart catheterization to assess for any complications and evaluate response to any interventions.            Informed Consent   Shared Decision Making/Informed Consent The risks [stroke (1 in 1000), death (1 in 1000), kidney failure [usually temporary] (1 in 500), bleeding (1 in 200), allergic reaction [possibly serious] (1 in 200)], benefits (diagnostic support and management of coronary artery disease) and alternatives of a cardiac catheterization were discussed in detail with [name] and he is willing to proceed.      Total time of encounter: 45 minutes total time of encounter, including 39 minutes spent in face-to-face patient care on the date of this encounter. This time includes coordination of care and counseling regarding above mentioned problem list. Remainder of non-face-to-face time involved reviewing chart documents/testing relevant to the patient encounter and documentation in the medical record. I have independently reviewed documentation from referring provider.   Weston Brass, MD, [facility]  [facility]

## 2023-03-01 LAB — BASIC METABOLIC PANEL WITH GFR
BUN/Creatinine Ratio: 21 — ABNORMAL HIGH (ref 9–20)
BUN: 19 mg/dL (ref 6–24)
CO2: 25 mmol/L (ref 20–29)
Calcium: 9.8 mg/dL (ref 8.7–10.2)
Chloride: 103 mmol/L (ref 96–106)
Creatinine, Ser: 0.92 mg/dL (ref 0.76–1.27)
Glucose: 83 mg/dL (ref 70–99)
Potassium: 4.7 mmol/L (ref 3.5–5.2)
Sodium: 142 mmol/L (ref 134–144)
eGFR: 96 mL/min/1.73

## 2023-03-01 LAB — CBC
Hematocrit: 47.7 % (ref 37.5–51.0)
Hemoglobin: 15.8 g/dL (ref 13.0–17.7)
MCH: 31.7 pg (ref 26.6–33.0)
MCHC: 33.1 g/dL (ref 31.5–35.7)
MCV: 96 fL (ref 79–97)
Platelets: 140 10*3/uL — ABNORMAL LOW (ref 150–450)
RBC: 4.98 x10E6/uL (ref 4.14–5.80)
RDW: 12 % (ref 11.6–15.4)
WBC: 6.7 10*3/uL (ref 3.4–10.8)

## 2023-03-08 ENCOUNTER — Encounter: Payer: Self-pay | Admitting: Internal Medicine

## 2023-03-09 NOTE — Telephone Encounter (Signed)
Called pt again to make sure he knows to call us back if he experiences any further symptoms/Chest Pain so that we can have him come in to see an APP.  He verbalized understanding.

## 2023-03-09 NOTE — Telephone Encounter (Signed)
Spoke to pt regarding his [facility] message from 03/08/2023.  He states that he has not had any symptoms since he last saw Dr. Jacques Navy.  He has been fairly active, continuing his same activity level as usual (and even walking the dogs more briskly than before).  He would like to cancel his Cardiac catheterization scheduled for 03/15/2023. Will call cath lab to cancel.  Will also cancel the 2 week f/u w/Acharya (post Cath).  Advised him to keep his Pharm D (Lipids management) appointment.  He will reach out to [place] if needed.  Pt verbalized understanding.

## 2023-03-14 ENCOUNTER — Ambulatory Visit: Payer: Medicare HMO | Admitting: Physician Assistant

## 2023-03-15 ENCOUNTER — Ambulatory Visit (HOSPITAL_COMMUNITY): Admission: RE | Admit: 2023-03-15 | Payer: Medicare HMO | Source: Home / Self Care | Admitting: Cardiovascular Disease

## 2023-03-15 ENCOUNTER — Encounter (HOSPITAL_COMMUNITY): Admission: RE | Payer: Self-pay | Source: Home / Self Care

## 2023-03-15 SURGERY — LEFT HEART CATH AND CORONARY ANGIOGRAPHY
Anesthesia: LOCAL

## 2023-03-20 ENCOUNTER — Ambulatory Visit (HOSPITAL_BASED_OUTPATIENT_CLINIC_OR_DEPARTMENT_OTHER): Payer: Medicare HMO | Admitting: Family Medicine

## 2023-03-23 ENCOUNTER — Ambulatory Visit (INDEPENDENT_AMBULATORY_CARE_PROVIDER_SITE_OTHER): Payer: Medicare HMO | Admitting: Family Medicine

## 2023-03-23 ENCOUNTER — Other Ambulatory Visit (HOSPITAL_BASED_OUTPATIENT_CLINIC_OR_DEPARTMENT_OTHER): Payer: Self-pay

## 2023-03-23 ENCOUNTER — Encounter (HOSPITAL_BASED_OUTPATIENT_CLINIC_OR_DEPARTMENT_OTHER): Payer: Self-pay | Admitting: Family Medicine

## 2023-03-23 VITALS — BP 136/80 | HR 63 | Ht 69.0 in | Wt 187.6 lb

## 2023-03-23 DIAGNOSIS — I10 Essential (primary) hypertension: Secondary | ICD-10-CM | POA: Diagnosis not present

## 2023-03-23 MED ORDER — LOSARTAN POTASSIUM 25 MG PO TABS
25.0000 mg | ORAL_TABLET | Freq: Every day | ORAL | 1 refills | Status: AC
  Filled 2023-03-23 – 2023-04-17 (×2): qty 90, 90d supply, fill #0
  Filled 2023-07-18: qty 90, 90d supply, fill #1

## 2023-03-23 NOTE — Progress Notes (Signed)
    Procedures performed today:    None.  Independent interpretation of notes and tests performed by another provider:   None.  Brief History, Exam, Impression, and Recommendations:    BP 136/80 (BP Location: Left Arm, Patient Position: Sitting, Cuff Size: Normal)   Pulse 63   Ht 5\' 9"  (1.753 m)   Wt 187 lb 9.6 oz (85.1 kg)   SpO2 100%   BMI 27.70 kg/m   Essential hypertension Assessment & Plan: Patient continues with losartan 25 mg and is also taking amlodipine 2.5 mg which was started by cardiology.  He does have some questions today about these medications.  No current issues with chest pain or headaches. It does appear patient was scheduled to have cardiac cath completed, however he had resolution of symptoms and thus procedure was canceled Blood pressure is borderline in office today, for now we will continue with current medication regimen.  Recommend DASH diet, regular aerobic exercise.  Recommend intermittent monitoring of blood pressure at home.  Will plan for follow-up in about 3 to 4 months to assess progress and determine if any changes to medication regimen are needed   Other orders -     Losartan Potassium; Take 1 tablet (25 mg total) by mouth daily.  Dispense: 90 tablet; Refill: 1  Return in about 3 months (around 06/23/2023) for hypertension.   ___________________________________________ Meghann Landing de [place], MD, ABFM, CAQSM Primary Care and Sports Medicine [facility]

## 2023-03-23 NOTE — Assessment & Plan Note (Signed)
Patient continues with losartan 25 mg and is also taking amlodipine 2.5 mg which was started by cardiology.  He does have some questions today about these medications.  No current issues with chest pain or headaches. It does appear patient was scheduled to have cardiac cath completed, however he had resolution of symptoms and thus procedure was canceled Blood pressure is borderline in office today, for now we will continue with current medication regimen.  Recommend DASH diet, regular aerobic exercise.  Recommend intermittent monitoring of blood pressure at home.  Will plan for follow-up in about 3 to 4 months to assess progress and determine if any changes to medication regimen are needed

## 2023-03-27 ENCOUNTER — Ambulatory Visit: Payer: Medicare HMO | Attending: Cardiology | Admitting: Pharmacist

## 2023-03-27 ENCOUNTER — Ambulatory Visit: Payer: Medicare HMO | Admitting: Cardiology

## 2023-03-27 ENCOUNTER — Encounter: Payer: Self-pay | Admitting: Pharmacist

## 2023-03-27 DIAGNOSIS — E7800 Pure hypercholesterolemia, unspecified: Secondary | ICD-10-CM

## 2023-03-27 DIAGNOSIS — I25118 Atherosclerotic heart disease of native coronary artery with other forms of angina pectoris: Secondary | ICD-10-CM | POA: Diagnosis not present

## 2023-03-27 DIAGNOSIS — R931 Abnormal findings on diagnostic imaging of heart and coronary circulation: Secondary | ICD-10-CM

## 2023-03-27 NOTE — Patient Instructions (Addendum)
It was nice meeting you two today  We would like your LDL (bad cholesterol) to be less than 70  I recommend starting the krill oil that you mentioned  The medication we spoke about today is called Repatha, which is an injection you would take once every 2 weeks  I will complete the prior authorization and let you know their response  If approved, we will recheck your fasting lipid panel in 2-3 months  Please let us know if you have any questions  Laural Golden, PharmD, BCACP, CDCES, CPP [address] [place], [place], [address] Phone: [phone], Fax: [phone]

## 2023-03-27 NOTE — Progress Notes (Signed)
Patient ID: [name]                 DOB: [birth year]                    MRN: [id number]     HPI: [name] is a 57 y.o. male patient referred to lipid clinic by Dr Jacques Navy. PMH is significant for HTN, HLD, CAD, PE, DVT, and elevated coronary calcium score.  Patient presents today with wife. Wife provides majority of medical history and conversation.   Patient has not tried any statins and is resistant to starting. Believes they will cause side effects and effect his memory which is already poor. Patient upset he has to take 2 blood pressure medications.  Wife's friend has been taking krill oil so she would like to start giving that to her husband.   Patient has large plaque burden and elevated coronary calcium score. Wife would like to update lipid panel. Patient not fasting today.  Current Medications: N/A  Intolerances: N/A  Risk Factors:  CAD Coronary Calcium  LDL goal: <70  Labs: TC 198, Trigs 70, HDL 59, LDL 126 (09/12/22)  Imaging:  1. Overall, mild CAD, however there is a severe stenosis in the proximal first diagonal 70-99% stenosis, CADRADS 4. CT FFR will beperformed and reported separately.   2. Total plaque volume 911 mm3 which is 87th percentile for age- and sex-matched controls (calcified plaque 103 mm3; non-calcified plaque 808 mm3). TPV is extensive.   3. Coronary calcium score is 488, which places the patient in the 92nd percentile for age and sex matched control.   4. Normal coronary origins with right dominance.  Past Medical History:  Diagnosis Date   Anemia    History of avascular necrosis of capital femoral epiphysis 2006, 2007   Bilateral Femoral Head   HLD (hyperlipidemia)    Pneumonia    twice   Psoriatic arthritis (HCC)     Current Outpatient Medications on File Prior to Visit  Medication Sig Dispense Refill   acetaminophen (TYLENOL) 500 MG tablet Take 1,000 mg by mouth every 8 (eight) hours as needed for mild pain or moderate pain.       amLODipine (NORVASC) 2.5 MG tablet Take 1 tablet (2.5 mg total) by mouth daily. 180 tablet 3   amoxicillin (AMOXIL) 500 MG capsule Take 4 capsules (2,000 mg total) by mouth 1 hour prior to appointment as directed. 12 capsule 2   Ascorbic Acid (VITAMIN C PO) Take by mouth daily.     Berberine Chloride 500 MG CAPS Take 550 mg by mouth.     cetirizine (ZYRTEC) 10 MG tablet Take 10 mg by mouth daily.     cholecalciferol (VITAMIN D) 1000 units tablet Take 2,000 Units by mouth daily.      Coenzyme Q10 (CO Q 10 PO) Take 600 mg by mouth daily.     Glutathione POWD 500 g by Does not apply route daily.  0   ibuprofen (ADVIL) 200 MG tablet Take 200 mg by mouth as needed.     losartan (COZAAR) 25 MG tablet Take 1 tablet (25 mg total) by mouth daily. 90 tablet 1   Magnesium 250 MG TABS Take 250 mg by mouth daily.     Melatonin 5 MG CAPS as needed.     Multiple Vitamins-Minerals (IMMUNE SUPPORT PO) Take 1,000 mg by mouth daily.     TURMERIC PO Take 1,000 mg by mouth daily.  No current facility-administered medications on file prior to visit.    Allergies  Allergen Reactions   Gold-Containing Drug Products Rash    Denies oral or airway involvement - occurred in the 1980s.     Assessment/Plan:  1. Hyperlipidemia - Patient LDL 126 which is above goal of <70. Wife would like to update fasting lipid panel before making treatment decisions. Placed order.  Recommended patient to start statin but they are resistant. Would prefer to start Repatha due to plaque burden. Advised will complete PA when lipid results come back but no guarantee of coverage.  Educated on how to use Repatha pen and possible side effects.   Advised she was welcome to start him on krill oil  Update lipid panel Complete PA for Repatha Recheck lipid panel in 2-3 months if approved  Laural Golden, PharmD, BCACP, CDCES, CPP [address], [address] [place], [place], [address] Phone: [phone], Fax: [phone]

## 2023-03-28 ENCOUNTER — Other Ambulatory Visit: Payer: Self-pay

## 2023-03-28 DIAGNOSIS — R931 Abnormal findings on diagnostic imaging of heart and coronary circulation: Secondary | ICD-10-CM | POA: Diagnosis not present

## 2023-03-28 DIAGNOSIS — I25118 Atherosclerotic heart disease of native coronary artery with other forms of angina pectoris: Secondary | ICD-10-CM | POA: Diagnosis not present

## 2023-03-28 DIAGNOSIS — E7800 Pure hypercholesterolemia, unspecified: Secondary | ICD-10-CM

## 2023-03-29 LAB — LIPID PANEL
Chol/HDL Ratio: 3.3 {ratio} (ref 0.0–5.0)
Cholesterol, Total: 205 mg/dL — ABNORMAL HIGH (ref 100–199)
HDL: 63 mg/dL (ref >39)
LDL Chol Calc (NIH): 131 mg/dL — ABNORMAL HIGH (ref 0–99)
Triglycerides: 61 mg/dL (ref 0–149)
VLDL Cholesterol Cal: 11 mg/dL (ref 5–40)

## 2023-04-04 ENCOUNTER — Telehealth: Payer: Self-pay | Admitting: *Deleted

## 2023-04-04 NOTE — Telephone Encounter (Signed)
The patient has been notified of the result and verbalized understanding.  All questions (if any) were answered. Patient is aware he will be contact by [facility]- starting  PCSK9i. RN informed patient  he should be receiving call or  appointment  in  when to start medication  from [facility] -  ( discussed at last office visit 03/27/23  Tobin Chad, RN 04/04/2023 1:33 PM

## 2023-04-04 NOTE — Telephone Encounter (Signed)
-----   Message from Parke Poisson sent at 03/30/2023 12:10 PM EST ----- LDL above goal for secondary prev of cad, recommend [id number]

## 2023-04-17 ENCOUNTER — Other Ambulatory Visit (HOSPITAL_BASED_OUTPATIENT_CLINIC_OR_DEPARTMENT_OTHER): Payer: Self-pay

## 2023-05-16 ENCOUNTER — Encounter: Payer: Self-pay | Admitting: Pharmacist

## 2023-05-17 NOTE — Progress Notes (Signed)
Office Visit Note  Patient: [name]             Date of Birth: [birth year]           MRN: [id number]             PCP: de Peru, Raymond J, MD Referring: de Peru, Raymond J, MD Visit Date: 05/31/2023 Occupation: @GUAROCC @  Subjective:  Joint stiffness  History of Present Illness: [name] is a 58 y.o. male with psoriatic arthritis, osteoarthritis and degenerative disc disease.  He returns today after his last visit in July 2024.  He has been off the Enbrel since November 2022.  Patient states that he has been going to the gym on a regular basis.  He states he continues to have some stiffness.  He denies any increased joint swelling.  He continues to have some discomfort in his lower back for which he goes to [facility].  His bilateral total hip replacements are doing well.    Activities of Daily Living:  Patient reports morning stiffness for 10 minutes.   Patient Denies nocturnal pain.  Difficulty dressing/grooming: Denies Difficulty climbing stairs: Denies Difficulty getting out of chair: Denies Difficulty using hands for taps, buttons, cutlery, and/or writing: Denies  Review of Systems  Constitutional:  Positive for fatigue.  HENT:  Negative for mouth sores and mouth dryness.   Eyes:  Negative for dryness.  Respiratory:  Negative for shortness of breath.   Cardiovascular:  Negative for chest pain and palpitations.  Gastrointestinal:  Negative for blood in stool, constipation and diarrhea.  Endocrine: Negative for increased urination.  Genitourinary:  Negative for involuntary urination.  Musculoskeletal:  Positive for morning stiffness. Negative for joint pain, gait problem, joint pain, joint swelling, myalgias, muscle weakness, muscle tenderness and myalgias.  Skin:  Negative for color change, rash and sensitivity to sunlight.  Allergic/Immunologic: Negative for susceptible to infections.  Neurological:  Negative for dizziness and headaches.  Hematological:   Negative for swollen glands.  Psychiatric/Behavioral:  Negative for depressed mood and sleep disturbance. The patient is not nervous/anxious.     PMFS History:  Patient Active Problem List   Diagnosis Date Noted   Chest discomfort 01/26/2023   Wellness examination 09/19/2022   Low back pain 07/13/2022   Corns and callosities 01/15/2020   Hammer toes, bilateral 01/15/2020   Orthopnea 11/01/2019   History of COVID-19 07/30/2019   Acute deep vein thrombosis (DVT) of left lower extremity (HCC) 07/30/2019   Abnormal CT of the chest 07/30/2019   Cough 07/30/2019   Leg DVT (deep venous thromboembolism), acute, bilateral (HCC) 07/24/2019   Essential hypertension 07/24/2019   COVID-19 virus infection 07/20/2019   Acute respiratory failure with hypoxia (HCC) 07/20/2019   Acute pulmonary embolism (HCC) 07/19/2019   Essential tremor 04/03/2017   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/2016   High risk medications (not anticoagulants) long-term use 09/20/2016   Bilateral hand pain 09/20/2016   DDD (degenerative disc disease), cervical 09/20/2016   History of total hip replacement, bilateral 09/20/2016   DDD (degenerative disc disease), lumbar 09/20/2016   Bilateral plantar fasciitis 09/20/2016   Psoriasis 09/20/2016   Primary osteoarthritis of both feet 09/20/2016   Primary osteoarthritis of both hands 09/20/2016   Primary osteoarthritis of both knees 09/20/2016   Prediabetes 09/22/2014   Hyperlipidemia 09/22/2014   URI (upper respiratory infection) 07/07/2012   Acute bronchitis 07/07/2012   OBESITY 07/03/2008   AVASCULAR NECROSIS, FEMORAL HEAD 07/03/2008   Psoriatic arthritis (HCC) 05/02/2001  Past Medical History:  Diagnosis Date   Anemia    History of avascular necrosis of capital femoral epiphysis 2006, 2007   Bilateral Femoral Head   HLD (hyperlipidemia)    Pneumonia    twice   Psoriatic arthritis (HCC)     Family History  Problem Relation Age of Onset   Pneumonia  Father    Tremor Neg Hx    Past Surgical History:  Procedure Laterality Date   CARPAL TUNNEL RELEASE Right 08/2022   JOINT REPLACEMENT Bilateral 2006, 2007   Necrosis femoral head (bilateral)   TOTAL HIP REVISION Right 11/23/2016   Procedure: Acetabulum liner and femoral head revision;  Surgeon: Ollen Gross, MD;  Location: [facility];  Service: Orthopedics;  Laterality: Right;   TOTAL HIP REVISION Left 04/10/2019   Procedure: Left hip bearing surface vs total hip arthroplasty revision;  Surgeon: Ollen Gross, MD;  Location: [facility];  Service: Orthopedics;  Laterality: Left;    Social History   Social History Narrative   Lives at home w/ his wife   Right-handed   Caffeine: 2-3 cups per day   Immunization History  Administered Date(s) Administered   Fluzone Influenza virus vaccine,trivalent (IIV3), split virus 02/03/2017, 03/21/2018   Influenza Inj Mdck Quad Pf 02/01/2019   Influenza, Seasonal, Injecte, Preservative Fre 01/17/2023   Influenza,inj,Quad PF,6+ Mos 02/03/2017, 03/21/2018, 01/06/2021   Influenza,inj,quad, With Preservative 02/01/2019   PFIZER(Purple Top)SARS-COV-2 Vaccination 10/06/2019, 10/27/2019   Pneumococcal Conjugate-13 03/21/2018   Tdap 09/22/2014   Zoster Recombinant(Shingrix) 04/02/2018, 05/23/2018     Objective: Vital Signs: BP (!) 142/76 (BP Location: Left Arm, Patient Position: Sitting)   Pulse 72   Resp 14   Ht 5\' 9"  (1.753 m)   Wt 186 lb (84.4 kg) Comment: per patient  BMI 27.47 kg/m    Physical Exam Vitals and nursing note reviewed.  Constitutional:      Appearance: He is well-developed.  HENT:     Head: Normocephalic and atraumatic.  Eyes:     Conjunctiva/sclera: Conjunctivae normal.     Pupils: Pupils are equal, round, and reactive to light.  Cardiovascular:     Rate and Rhythm: Normal rate and regular rhythm.     Heart sounds: Normal heart sounds.  Pulmonary:     Effort: Pulmonary effort is normal.     Breath sounds: Normal  breath sounds.  Abdominal:     General: Bowel sounds are normal.     Palpations: Abdomen is soft.  Musculoskeletal:     Cervical back: Normal range of motion and neck supple.  Skin:    General: Skin is warm and dry.     Capillary Refill: Capillary refill takes less than 2 seconds.  Neurological:     Mental Status: He is alert and oriented to person, place, and time.  Psychiatric:        Behavior: Behavior normal.      Musculoskeletal Exam: He had limited flexion, extension, lateral rotation of the cervical spine.  Thoracic kyphosis was noted.  He had limited range of motion of the thoracic and lumbar spine.  Shoulder joints were in good range of motion.  He had bilateral elbow joint contractures which were unchanged.  Bilateral PIP and DIP thickening with no synovitis was noted.  Hip joints were replaced and were in good range of motion.  Knee joints in good range of motion without any warmth swelling or effusion.  There was no tenderness over ankles or MTPs.  There was no Achilles tendinitis  or plantar fasciitis.  CDAI Exam: CDAI Score: -- Patient Global: --; Provider Global: -- Swollen: --; Tender: -- Joint Exam 05/31/2023   No joint exam has been documented for this visit   There is currently no information documented on the homunculus. Go to the Rheumatology activity and complete the homunculus joint exam.  Investigation: No additional findings.  Imaging: XR Hand 2 View Right Result Date: 05/31/2023 CMC, PIP and DIP narrowing was noted. All MCP, intercarpal, radiocarpal joint space narrowing was noted.  Cystic versus erosive changes were noted in the carpals.   No radiographic progression was noted when compared to the x-rays of 2021. Impression: These findings suggestive of osteoarthritis and inflammatory arthritis overlap.  XR Hand 2 View Left Result Date: 05/31/2023 Severe CMC, PIP and DIP narrowing was noted.  Narrowing of all MCP joints was noted.  Intercarpal and  radiocarpal joint space narrowing was noted.  Cystic versus erosive changes were noted in the carpal bones and ulnar styloid.  No radiographic progression was noted when compared to the x-rays of 2021. Impression: These findings suggestive of osteoarthritis and inflammatory arthritis overlap.  XR Foot 2 Views Right Result Date: 05/31/2023 Narrowing of all MTPs, PIP and DIP joints was noted.  Erosive changes were noted in all the metatarsal heads.  No intertarsal narrowing was noted.  Dorsal spurring was noted.  No tibiotalar space narrowing was noted.  Subtalar joint space narrowing was noted.  No radiographic progression was noted when compared to the x-rays of 2021. Impression: These findings are suggestive of erosive inflammatory arthritis and osteoarthritis overlap.  XR Foot 2 Views Left Result Date: 05/31/2023 Severe narrowing of MTP joints, PIP and DIP joints was noted.  Erosive changes were noted in most of the MTP joints.  Intertarsal narrowing with dorsal spurring was noted.  Subtalar narrowing was noted.  No radiographic progression was noted when compared to the x-rays of 2021. Impression: These findings suggestive of erosive inflammatory arthritis and osteoarthritis overlap.   Recent Labs: Lab Results  Component Value Date   WBC 6.7 02/28/2023   HGB 15.8 02/28/2023   PLT 140 (L) 02/28/2023   NA 142 02/28/2023   K 4.7 02/28/2023   CL 103 02/28/2023   CO2 25 02/28/2023   GLUCOSE 83 02/28/2023   BUN 19 02/28/2023   CREATININE 0.92 02/28/2023   BILITOT 0.8 09/12/2022   ALKPHOS 65 09/12/2022   AST 19 09/12/2022   ALT 49 (H) 09/12/2022   PROT 7.2 09/12/2022   ALBUMIN 4.5 09/12/2022   CALCIUM 9.8 02/28/2023   GFRAA 94 09/16/2020   QFTBGOLDPLUS NEGATIVE 02/17/2021    Speciality Comments: No specialty comments available.  Procedures:  No procedures performed Allergies: Gold-containing drug products   Assessment / Plan:     Visit Diagnoses: Psoriatic arthritis (HCC) -patient  has known history of psoriatic arthritis.  He has been off Enbrel since November 2022.  He denies any increased swelling.  He continues to have some joint stiffness.  He denies any history of plantar fasciitis, Achilles tendinitis or uveitis.  I will obtain x-rays to monitor for radiographic progression.  Plan: XR Hand 2 View Right, XR Hand 2 View Left, XR Foot 2 Views Left, XR Foot 2 Views Right.  X-rays showed erosive inflammatory arthritis and osteoarthritis overlap.  No radiographic progression was noted when compared to the x-rays of 2021.  Psoriasis-no active psoriasis lesions were noted.  High risk medication use - Patient discontinued Enbrel in November 2022 due to frequent upper respiratory  tract infections.  Primary osteoarthritis of both hands -bilateral PIP and DIP thickening with no synovitis was noted.  Plan: XR Hand 2 View Right, XR Hand 2 View Left  Right hand paresthesia-he noted improvement after carpal tunnel release.  Contracture of joints of both elbows-he has full contractures which have not changed.  No synovitis noted.  History of total hip replacement, bilateral-he had good range of motion without any discomfort.  Primary osteoarthritis of both knees-no warmth swelling or effusion was noted.  Primary osteoarthritis of both feet - Followed by Dr. Stacie Acres -he continues to have discomfort in his feet.  No synovitis, plantar fasciitis or Achilles tendinitis was noted.  Plan: XR Foot 2 Views Left, XR Foot 2 Views Right  DDD (degenerative disc disease), cervical-he had limited lateral rotation, flexion and extension.  Lumbar spondylosis-had limited range of motion of the lumbar spine.  He has been followed by [facility].  Further medical problems are listed as follows:  History of pulmonary embolism-treated with Eliquis in 2021 after COVID-19 virus infection.  History of hypertension-blood pressure was elevated at 122/76.  He was advised to monitor blood pressure closely  and follow-up with his PCP.  History of hyperlipidemia  History of prediabetes  History of vitamin D deficiency  Orders: Orders Placed This Encounter  Procedures   XR Hand 2 View Right   XR Hand 2 View Left   XR Foot 2 Views Left   XR Foot 2 Views Right   No orders of the defined types were placed in this encounter.    Follow-Up Instructions: Return in about 6 months (around 11/28/2023) for Psoriatic arthritis, Osteoarthritis.   Pollyann Savoy, MD  Note - This record has been created using Animal nutritionist.  Chart creation errors have been sought, but may not always  have been located. Such creation errors do not reflect on  the standard of medical care.

## 2023-05-22 ENCOUNTER — Other Ambulatory Visit (HOSPITAL_COMMUNITY): Payer: Self-pay

## 2023-05-22 ENCOUNTER — Telehealth: Payer: Self-pay | Admitting: Pharmacy Technician

## 2023-05-22 DIAGNOSIS — R931 Abnormal findings on diagnostic imaging of heart and coronary circulation: Secondary | ICD-10-CM

## 2023-05-22 DIAGNOSIS — E785 Hyperlipidemia, unspecified: Secondary | ICD-10-CM

## 2023-05-22 DIAGNOSIS — I25118 Atherosclerotic heart disease of native coronary artery with other forms of angina pectoris: Secondary | ICD-10-CM

## 2023-05-22 NOTE — Telephone Encounter (Signed)
Pharmacy Patient Advocate Encounter   Received notification from Patient Advice Request messages that prior authorization for repatha is required/requested.   Insurance verification completed.   The patient is insured through [facility] .   Per test claim: PA required; PA submitted to above mentioned insurance via CoverMyMeds Key/confirmation #/EOC [id number] Status is pending

## 2023-05-22 NOTE — Telephone Encounter (Signed)
Pharmacy Patient Advocate Encounter  Received notification from [facility] that Prior Authorization for repatha has been APPROVED from 05/03/23 to 05/01/24. Ran test claim, Copay is $144.24  one month . This test claim was processed through [facility]- copay amounts may vary at other pharmacies due to pharmacy/plan contracts, or as the patient moves through the different stages of their insurance plan.   PA #/Case ID/Reference #: [id number]

## 2023-05-23 ENCOUNTER — Other Ambulatory Visit (HOSPITAL_BASED_OUTPATIENT_CLINIC_OR_DEPARTMENT_OTHER): Payer: Self-pay

## 2023-05-23 MED ORDER — REPATHA SURECLICK 140 MG/ML ~~LOC~~ SOAJ
1.0000 mL | SUBCUTANEOUS | 1 refills | Status: AC
Start: 2023-05-23 — End: ?
  Filled 2023-05-23: qty 6, 84d supply, fill #0

## 2023-05-23 NOTE — Addendum Note (Signed)
Addended by: Cheree Ditto on: 05/23/2023 09:03 AM   Modules accepted: Orders

## 2023-05-25 ENCOUNTER — Other Ambulatory Visit (HOSPITAL_BASED_OUTPATIENT_CLINIC_OR_DEPARTMENT_OTHER): Payer: Self-pay

## 2023-05-31 ENCOUNTER — Ambulatory Visit: Payer: Medicare HMO

## 2023-05-31 ENCOUNTER — Encounter: Payer: Self-pay | Admitting: Rheumatology

## 2023-05-31 ENCOUNTER — Ambulatory Visit: Payer: Medicare HMO | Attending: Rheumatology | Admitting: Rheumatology

## 2023-05-31 ENCOUNTER — Ambulatory Visit (INDEPENDENT_AMBULATORY_CARE_PROVIDER_SITE_OTHER): Payer: Medicare HMO

## 2023-05-31 VITALS — BP 142/76 | HR 72 | Resp 14 | Ht 69.0 in | Wt 186.0 lb

## 2023-05-31 DIAGNOSIS — Z86711 Personal history of pulmonary embolism: Secondary | ICD-10-CM | POA: Diagnosis not present

## 2023-05-31 DIAGNOSIS — Z96643 Presence of artificial hip joint, bilateral: Secondary | ICD-10-CM | POA: Diagnosis not present

## 2023-05-31 DIAGNOSIS — L4050 Arthropathic psoriasis, unspecified: Secondary | ICD-10-CM

## 2023-05-31 DIAGNOSIS — M19071 Primary osteoarthritis, right ankle and foot: Secondary | ICD-10-CM | POA: Diagnosis not present

## 2023-05-31 DIAGNOSIS — M19042 Primary osteoarthritis, left hand: Secondary | ICD-10-CM

## 2023-05-31 DIAGNOSIS — M24521 Contracture, right elbow: Secondary | ICD-10-CM

## 2023-05-31 DIAGNOSIS — L409 Psoriasis, unspecified: Secondary | ICD-10-CM | POA: Diagnosis not present

## 2023-05-31 DIAGNOSIS — Z8679 Personal history of other diseases of the circulatory system: Secondary | ICD-10-CM | POA: Diagnosis not present

## 2023-05-31 DIAGNOSIS — Z8639 Personal history of other endocrine, nutritional and metabolic disease: Secondary | ICD-10-CM

## 2023-05-31 DIAGNOSIS — M19072 Primary osteoarthritis, left ankle and foot: Secondary | ICD-10-CM

## 2023-05-31 DIAGNOSIS — M5030 Other cervical disc degeneration, unspecified cervical region: Secondary | ICD-10-CM | POA: Diagnosis not present

## 2023-05-31 DIAGNOSIS — Z79899 Other long term (current) drug therapy: Secondary | ICD-10-CM | POA: Diagnosis not present

## 2023-05-31 DIAGNOSIS — M47816 Spondylosis without myelopathy or radiculopathy, lumbar region: Secondary | ICD-10-CM | POA: Diagnosis not present

## 2023-05-31 DIAGNOSIS — R202 Paresthesia of skin: Secondary | ICD-10-CM

## 2023-05-31 DIAGNOSIS — M19041 Primary osteoarthritis, right hand: Secondary | ICD-10-CM

## 2023-05-31 DIAGNOSIS — Z87898 Personal history of other specified conditions: Secondary | ICD-10-CM

## 2023-05-31 DIAGNOSIS — M170 Bilateral primary osteoarthritis of knee: Secondary | ICD-10-CM

## 2023-05-31 DIAGNOSIS — M24522 Contracture, left elbow: Secondary | ICD-10-CM

## 2023-06-02 ENCOUNTER — Other Ambulatory Visit (HOSPITAL_BASED_OUTPATIENT_CLINIC_OR_DEPARTMENT_OTHER): Payer: Self-pay

## 2023-07-18 ENCOUNTER — Other Ambulatory Visit (HOSPITAL_BASED_OUTPATIENT_CLINIC_OR_DEPARTMENT_OTHER): Payer: Self-pay

## 2023-08-02 DIAGNOSIS — D225 Melanocytic nevi of trunk: Secondary | ICD-10-CM | POA: Diagnosis not present

## 2023-08-02 DIAGNOSIS — L814 Other melanin hyperpigmentation: Secondary | ICD-10-CM | POA: Diagnosis not present

## 2023-08-02 DIAGNOSIS — L821 Other seborrheic keratosis: Secondary | ICD-10-CM | POA: Diagnosis not present

## 2023-08-03 DIAGNOSIS — Z1211 Encounter for screening for malignant neoplasm of colon: Secondary | ICD-10-CM | POA: Diagnosis not present

## 2023-08-10 LAB — COLOGUARD: COLOGUARD: NEGATIVE

## 2023-08-10 LAB — EXTERNAL GENERIC LAB PROCEDURE: COLOGUARD: NEGATIVE

## 2023-09-05 ENCOUNTER — Other Ambulatory Visit (HOSPITAL_BASED_OUTPATIENT_CLINIC_OR_DEPARTMENT_OTHER): Payer: Self-pay

## 2023-09-14 DIAGNOSIS — M2042 Other hammer toe(s) (acquired), left foot: Secondary | ICD-10-CM | POA: Diagnosis not present

## 2023-09-14 DIAGNOSIS — M2041 Other hammer toe(s) (acquired), right foot: Secondary | ICD-10-CM | POA: Diagnosis not present

## 2023-09-18 ENCOUNTER — Encounter: Payer: Self-pay | Admitting: *Deleted

## 2023-09-18 ENCOUNTER — Telehealth: Payer: Self-pay | Admitting: Rheumatology

## 2023-09-18 NOTE — Telephone Encounter (Signed)
 Letter faxed.

## 2023-09-18 NOTE — Telephone Encounter (Signed)
 Patient called requesting a clearance letter from Dr. Alvira Josephs to be sent to Dr. Cherl Corner at [facility] so he can schedule foot surgery.

## 2023-09-21 ENCOUNTER — Telehealth: Payer: Self-pay

## 2023-09-21 ENCOUNTER — Telehealth: Payer: Self-pay | Admitting: *Deleted

## 2023-09-21 NOTE — Telephone Encounter (Signed)
   Name: [name]  DOB: 1966-02-24  MRN: [id number]  Primary Cardiologist: Euell Herrlich, MD   Preoperative team, please contact this patient and set up a phone call appointment for further preoperative risk assessment. Please obtain consent and complete medication review. Thank you for your help.  I confirm that guidance regarding antiplatelet and oral anticoagulation therapy has been completed and, if necessary, noted below.  None  I also confirmed the patient resides in the state of [place] . As per [facility] Medical Board telemedicine laws, the patient must reside in the state in which the provider is licensed.   Francene Ing, Retha Cast, NP 09/21/2023, 9:33 AM [facility]

## 2023-09-21 NOTE — Telephone Encounter (Signed)
   Pre-operative Risk Assessment    Patient Name: [name]  DOB: Sep 14, 1965 MRN: [id number]   Date of last office visit: 02/28/23 Date of next office visit: N/A   Request for Surgical Clearance    Procedure:  Left foot hammertoe correction via PIP fusion 2nd-4th and 5th toe flexortenotomy   Date of Surgery:  Clearance 10/02/23                                 Surgeon:  Dr. Ali Ink  Surgeon's Group or Practice Name:  [facility]  Phone number:  [phone] Fax number:  [phone]   Type of Clearance Requested:   - Medical    Type of Anesthesia:  Not Indicated   Additional requests/questions:    Mansfield Seip   09/21/2023, 9:07 AM

## 2023-09-21 NOTE — Telephone Encounter (Signed)
 Pt has scheduled for a tele visit, 09/22/23 1:40.  Consent on file / medications reconciled.    Patient Consent for Virtual Visit        [name] has provided verbal consent on 09/21/2023 for a virtual visit (video or telephone).   CONSENT FOR VIRTUAL VISIT FOR:  [name]  By participating in this virtual visit I agree to the following:  I hereby voluntarily request, consent and authorize [facility] and its employed or contracted physicians, physician assistants, nurse practitioners or other licensed health care professionals (the Practitioner), to provide me with telemedicine health care services (the "Services") as deemed necessary by the treating Practitioner. I acknowledge and consent to receive the Services by the Practitioner via telemedicine. I understand that the telemedicine visit will involve communicating with the Practitioner through live audiovisual communication technology and the disclosure of certain medical information by electronic transmission. I acknowledge that I have been given the opportunity to request an in-person assessment or other available alternative prior to the telemedicine visit and am voluntarily participating in the telemedicine visit.  I understand that I have the right to withhold or withdraw my consent to the use of telemedicine in the course of my care at any time, without affecting my right to future care or treatment, and that the Practitioner or I may terminate the telemedicine visit at any time. I understand that I have the right to inspect all information obtained and/or recorded in the course of the telemedicine visit and may receive copies of available information for a reasonable fee.  I understand that some of the potential risks of receiving the Services via telemedicine include:  Delay or interruption in medical evaluation due to technological equipment failure or disruption; Information transmitted may not be sufficient (e.g.  poor resolution of images) to allow for appropriate medical decision making by the Practitioner; and/or  In rare instances, security protocols could fail, causing a breach of personal health information.  Furthermore, I acknowledge that it is my responsibility to provide information about my medical history, conditions and care that is complete and accurate to the best of my ability. I acknowledge that Practitioner's advice, recommendations, and/or decision may be based on factors not within their control, such as incomplete or inaccurate data provided by me or distortions of diagnostic images or specimens that may result from electronic transmissions. I understand that the practice of medicine is not an exact science and that Practitioner makes no warranties or guarantees regarding treatment outcomes. I acknowledge that a copy of this consent can be made available to me via my patient portal [facility] MyChart), or I can request a printed copy by calling the office of [facility].    I understand that my insurance will be billed for this visit.   I have read or had this consent read to me. I understand the contents of this consent, which adequately explains the benefits and risks of the Services being provided via telemedicine.  I have been provided ample opportunity to ask questions regarding this consent and the Services and have had my questions answered to my satisfaction. I give my informed consent for the services to be provided through the use of telemedicine in my medical care

## 2023-09-21 NOTE — Telephone Encounter (Signed)
 Pt has scheduled for a tele visit, 09/22/23 1:40.  Consent on file / medications reconciled.

## 2023-09-22 ENCOUNTER — Ambulatory Visit: Attending: Cardiology

## 2023-09-22 DIAGNOSIS — Z01810 Encounter for preprocedural cardiovascular examination: Secondary | ICD-10-CM | POA: Diagnosis not present

## 2023-09-22 NOTE — Progress Notes (Signed)
 Virtual Visit via Telephone Note   Because of NAVEN GIAMBALVO co-morbid illnesses, he is at least at moderate risk for complications without adequate follow up.  This format is felt to be most appropriate for this patient at this time.  Due to technical limitations with video connection (technology), today's appointment will be conducted as an audio only telehealth visit, and [name] verbally agreed to proceed in this manner.   All issues noted in this document were discussed and addressed.  No physical exam could be performed with this format.  Evaluation Performed:  Preoperative cardiovascular risk assessment _____________   Date:  09/22/2023   Patient ID:  [name], [name] 05/15/1965, MRN [id number] Patient Location:  Home Provider location:   Office  Primary Care Provider:  No primary care provider on file. Primary Cardiologist:  Euell Herrlich, MD  Chief Complaint / Patient Profile   58 y.o. y/o male with a h/o HTN HLD, CAD, psoriatic arthritis, avascular necrosis, bilateral PE who is pending  Left foot hammertoe correction via PIP fusion 2nd-4th and 5th toe flexortenotomy  and presents today for telephonic preoperative cardiovascular risk assessment.  History of Present Illness    [name] is a 58 y.o. male who presents via audio/video conferencing for a telehealth visit today.  Pt was last seen in cardiology clinic on 02/28/2023 by Dr. Chancy Comber.  At that time [name] was experiencing chest discomfort with activity.  He completed a coronary CTA that showed no hemodynamically significant stenosis with mild CAD.  The patient is now pending procedure as outlined above. Since his last visit, he has been doing well with no new cardiac complaints.  He is staying active and works out daily.  He does note that his blood pressure seems to increase throughout the day and I advised him to change his interval and take amlodipine  midday to later in the day to help balance  his blood pressures.  He reports his blood pressures have been running in the 140s over 70s.  He denies chest pain, shortness of breath, lower extremity edema, fatigue, palpitations, melena, hematuria, hemoptysis, diaphoresis, weakness, presyncope, syncope, orthopnea, and PND.    Past Medical History    Past Medical History:  Diagnosis Date   Anemia    History of avascular necrosis of capital femoral epiphysis 2006, 2007   Bilateral Femoral Head   HLD (hyperlipidemia)    Pneumonia    twice   Psoriatic arthritis [facility])    Past Surgical History:  Procedure Laterality Date   CARPAL TUNNEL RELEASE Right 08/2022   JOINT REPLACEMENT Bilateral 2006, 2007   Necrosis femoral head (bilateral)   TOTAL HIP REVISION Right 11/23/2016   Procedure: Acetabulum liner and femoral head revision;  Surgeon: Liliane Rei, MD;  Location: [facility];  Service: Orthopedics;  Laterality: Right;   TOTAL HIP REVISION Left 04/10/2019   Procedure: Left hip bearing surface vs total hip arthroplasty revision;  Surgeon: Liliane Rei, MD;  Location: [facility];  Service: Orthopedics;  Laterality: Left;     Allergies  Allergies  Allergen Reactions   Gold-Containing Drug Products Rash    Denies oral or airway involvement - occurred in the 1980s.     Home Medications    Prior to Admission medications   Medication Sig Start Date End Date Taking? Authorizing Provider  acetaminophen  (TYLENOL ) 500 MG tablet Take 1,000 mg by mouth every 8 (eight) hours as needed for mild pain or moderate pain.  [provider]  amLODipine  (NORVASC ) 2.5 MG tablet Take 1 tablet (2.5 mg total) by mouth daily. 02/16/23 12/15/23  Morey Ar, NP  amoxicillin  (AMOXIL ) 500 MG capsule Take 4 capsules (2,000 mg total) by mouth 1 hour prior to appointment as directed. 12/28/22     Ascorbic Acid (VITAMIN C PO) Take by mouth daily.    [provider]  Berberine Chloride 500 MG CAPS Take 550 mg by mouth.     [provider]  cetirizine  (ZYRTEC ) 10 MG tablet Take 10 mg by mouth daily.    [provider]  cholecalciferol (VITAMIN D ) 1000 units tablet Take 2,000 Units by mouth daily.     [provider]  Evolocumab  (REPATHA  SURECLICK) 140 MG/ML SOAJ Inject 140 mg into the skin every 14 (fourteen) days. Patient not taking: Reported on 09/21/2023 05/23/23   Acharya, Gayatri A, MD  ibuprofen (ADVIL) 200 MG tablet Take 200 mg by mouth as needed.    [provider]  losartan  (COZAAR ) 25 MG tablet Take 1 tablet (25 mg total) by mouth daily. 03/23/23   de [place], Raymond J, MD  Magnesium 250 MG TABS Take 250 mg by mouth daily.    [provider]  TURMERIC PO Take 1,000 mg by mouth daily.    [provider]    Physical Exam    Vital Signs:  [name] does not have vital signs available for review today 145/73  Given telephonic nature of communication, physical exam is limited. AAOx3. NAD. Normal affect.  Speech and respirations are unlabored.  Accessory Clinical Findings    None  Assessment & Plan    1.  Preoperative Cardiovascular Risk Assessment: - Patient's RCRI score is 0.4%  -The patient affirms he has been doing well without any new cardiac symptoms. They are able to achieve 7 METS without cardiac limitations. Therefore, based on ACC/AHA guidelines, the patient would be at acceptable risk for the planned procedure without further cardiovascular testing. The patient was advised that if he develops new symptoms prior to surgery to contact our office to arrange for a follow-up visit, and he verbalized understanding.   The patient was advised that if he develops new symptoms prior to surgery to contact our office to arrange for a follow-up visit, and he verbalized understanding.   A copy of this note will be routed to requesting surgeon.  Time:   Today, I have spent 6 minutes with the patient with telehealth technology discussing medical  history, symptoms, and management plan.     Francene Ing, Retha Cast, NP  09/22/2023, 7:22 AM

## 2023-10-02 ENCOUNTER — Other Ambulatory Visit (HOSPITAL_BASED_OUTPATIENT_CLINIC_OR_DEPARTMENT_OTHER): Payer: Self-pay

## 2023-10-02 ENCOUNTER — Other Ambulatory Visit: Payer: Self-pay

## 2023-10-02 DIAGNOSIS — G8918 Other acute postprocedural pain: Secondary | ICD-10-CM | POA: Diagnosis not present

## 2023-10-02 DIAGNOSIS — B351 Tinea unguium: Secondary | ICD-10-CM | POA: Diagnosis not present

## 2023-10-02 DIAGNOSIS — M205X2 Other deformities of toe(s) (acquired), left foot: Secondary | ICD-10-CM | POA: Diagnosis not present

## 2023-10-02 DIAGNOSIS — M2042 Other hammer toe(s) (acquired), left foot: Secondary | ICD-10-CM | POA: Diagnosis not present

## 2023-10-02 HISTORY — PX: TOE SURGERY: SHX1073

## 2023-10-02 MED ORDER — ONDANSETRON 4 MG PO TBDP
4.0000 mg | ORAL_TABLET | Freq: Three times a day (TID) | ORAL | 0 refills | Status: DC | PRN
  Filled 2023-10-02: qty 15, 5d supply, fill #0

## 2023-10-02 MED ORDER — DOCUSATE SODIUM 100 MG PO CAPS
100.0000 mg | ORAL_CAPSULE | Freq: Two times a day (BID) | ORAL | 0 refills | Status: DC
  Filled 2023-10-02: qty 56, 28d supply, fill #0

## 2023-10-02 MED ORDER — ASPIRIN 81 MG PO TBEC
81.0000 mg | DELAYED_RELEASE_TABLET | Freq: Two times a day (BID) | ORAL | 0 refills | Status: DC
  Filled 2023-10-02: qty 56, 28d supply, fill #0

## 2023-10-02 MED ORDER — OXYCODONE HCL 5 MG PO TABS
5.0000 mg | ORAL_TABLET | Freq: Four times a day (QID) | ORAL | 0 refills | Status: AC
  Filled 2023-10-02: qty 28, 7d supply, fill #0

## 2023-10-03 ENCOUNTER — Other Ambulatory Visit (HOSPITAL_BASED_OUTPATIENT_CLINIC_OR_DEPARTMENT_OTHER): Payer: Self-pay

## 2023-10-03 MED ORDER — ELIQUIS 2.5 MG PO TABS
2.5000 mg | ORAL_TABLET | Freq: Two times a day (BID) | ORAL | 0 refills | Status: DC
  Filled 2023-10-03: qty 24, 12d supply, fill #0

## 2023-10-10 ENCOUNTER — Other Ambulatory Visit (HOSPITAL_COMMUNITY): Payer: Self-pay

## 2023-10-10 ENCOUNTER — Other Ambulatory Visit (HOSPITAL_COMMUNITY): Payer: Self-pay | Admitting: Orthopaedic Surgery

## 2023-10-10 ENCOUNTER — Ambulatory Visit: Attending: Vascular Surgery | Admitting: Student-PharmD

## 2023-10-10 ENCOUNTER — Ambulatory Visit (HOSPITAL_COMMUNITY)
Admission: RE | Admit: 2023-10-10 | Discharge: 2023-10-10 | Disposition: A | Discharge status: Home / Self Care | Source: Ambulatory Visit | Attending: Vascular Surgery | Admitting: Vascular Surgery

## 2023-10-10 ENCOUNTER — Encounter: Payer: Self-pay | Admitting: Student-PharmD

## 2023-10-10 DIAGNOSIS — I82412 Acute embolism and thrombosis of left femoral vein: Secondary | ICD-10-CM

## 2023-10-10 DIAGNOSIS — R600 Localized edema: Secondary | ICD-10-CM | POA: Diagnosis not present

## 2023-10-10 MED ORDER — APIXABAN 5 MG PO TABS
5.0000 mg | ORAL_TABLET | Freq: Two times a day (BID) | ORAL | 0 refills | Status: DC
  Filled 2023-10-10 (×2): qty 60, 30d supply, fill #0

## 2023-10-10 NOTE — Patient Instructions (Signed)
-  Start apixaban  (Eliquis ) 5 mg twice daily. -It is important to take your medication around the same time every day.  -Avoid NSAIDs like ibuprofen (Advil, Motrin) and naproxen (Aleve) as well as aspirin  doses over 100 mg daily. -Tylenol  (acetaminophen ) is the preferred over the counter pain medication to lower the risk of bleeding. -Be sure to alert all of your health care providers that you are taking an anticoagulant prior to starting a new medication or having a procedure. -Monitor for signs and symptoms of bleeding (abnormal bruising, prolonged bleeding, nose bleeds, bleeding from gums, discolored urine, black tarry stools). If you have fallen and hit your head OR if your bleeding is severe or not stopping, seek emergency care.  -Go to the emergency room if emergent signs and symptoms of new clot occur (new or worse swelling and pain in an arm or leg, shortness of breath, chest pain, fast or irregular heartbeats, lightheadedness, dizziness, fainting, coughing up blood) or if you experience a significant color change (pale or blue) in the extremity that has the DVT.  -We recommend you wear compression stockings (20-30 mmHg) as long as you are having swelling or pain. Be sure to purchase the correct size and take them off at night.   If you have any questions or need to reschedule an appointment, please call [phone]. If you are having an emergency, call 911 or present to the nearest emergency room.   What is a DVT?  -Deep vein thrombosis (DVT) is a condition in which a blood clot forms in a vein of the deep venous system which can occur in the lower leg, thigh, pelvis, arm, or neck. This condition is serious and can be life-threatening if the clot travels to the arteries of the lungs and causing a blockage (pulmonary embolism, PE). A DVT can also damage veins in the leg, which can lead to long-term venous disease, leg pain, swelling, discoloration, and ulcers or sores (post-thrombotic syndrome).   -Treatment may include taking an anticoagulant medication to prevent more clots from forming and the current clot from growing, wearing compression stockings, and/or surgical procedures to remove or dissolve the clot.

## 2023-10-10 NOTE — Progress Notes (Signed)
 DVT Clinic Note  Name: [name]     MRN: [id number]     DOB: Jun 30, 1965     Sex: male  PCP: Patient, No Pcp Per  Today's Visit: Visit Information: Initial Visit  Referred to DVT Clinic by: Orthopedic Surgery - Dr. Cherl Corner [facility]) Referred to CPP by: Dr. Edgardo Goodwill Reason for referral:  Chief Complaint  Patient presents with   DVT   HISTORY OF PRESENT ILLNESS: [name] is a 58 y.o. male with PMH DVT/PE 07/2019, HLD, HTN, who presents after diagnosis of DVT for medication management. His first DVT in 2021 was provoked by COVID infection and he was treated short-term with anticoagulation. He is now s/p right foot great toe nail excision + left foot 2nd-4th hammertoe correction + 5th toe percutaneous tenotomy on 10/02/23 by Dr. Cherl Corner, who prescribed Eliquis  2.5 mg BID for DVT prevention around this procedure. He was seen today for follow up and Dr. Cherl Corner noted bilateral lower extremity edema and wanted to rule out DVT. Ultrasound showed nonocclusive age-indeterminate DVT in the left femoral vein and was referred to DVT Clinic to further manage. Patient presents ambulating with use of a cane and accompanied by his wife. Denies any unusual pain or cramping in the left leg. Reports that his leg doesn't feel the same as it did with his last DVT. Endorses taking Eliquis  2.5 mg for DVT prevention as prescribed. Denies chest pain, SOB. Reports that he has been more sedentary than usual leading up to the surgery due to the pain he was in.   Positive Thrombotic Risk Factors: Recent surgery (within 3 months), Previous VTE, Other (comment) (decreased activity over the last month or so) Bleeding Risk Factors: Anticoagulant therapy  Negative Thrombotic Risk Factors: Recent trauma (within 3 months), Recent admission to hospital with acute illness (within 3 months), Paralysis, paresis, or recent plaster cast immobilization of lower extremity, Central venous catheterization, Bed rest >72 hours  within 3 months, Sedentary journey lasting >8 hours within 4 weeks, Pregnancy, Within 6 weeks postpartum, Recent cesarean section (within 3 months), Estrogen therapy, Testosterone therapy, Erythropoiesis-stimulating agent, Recent COVID diagnosis (within 3 months), Active cancer, Non-malignant, chronic inflammatory condition, Known thrombophilic condition, Smoking, Obesity, Older age  Rx Insurance Coverage: Medicare Rx Affordability: Eliquis  is ~$150/month (patient pays % coinsurance on his Medicare plan rather than set copays for tiers) Rx Assistance Provided: Patient has previously used the one-time free card for Eliquis . Preferred Pharmacy: Filled first 30 days at [facility] today. Refills to be transferred to [facility] for home delivery.  Past Medical History:  Diagnosis Date   Anemia    History of avascular necrosis of capital femoral epiphysis 2006, 2007   Bilateral Femoral Head   HLD (hyperlipidemia)    Pneumonia    twice   Psoriatic arthritis [facility])     Past Surgical History:  Procedure Laterality Date   CARPAL TUNNEL RELEASE Right 08/2022   JOINT REPLACEMENT Bilateral 2006, 2007   Necrosis femoral head (bilateral)   TOTAL HIP REVISION Right 11/23/2016   Procedure: Acetabulum liner and femoral head revision;  Surgeon: Liliane Rei, MD;  Location: [facility];  Service: Orthopedics;  Laterality: Right;   TOTAL HIP REVISION Left 04/10/2019   Procedure: Left hip bearing surface vs total hip arthroplasty revision;  Surgeon: Liliane Rei, MD;  Location: [facility];  Service: Orthopedics;  Laterality: Left;     Social History   Socioeconomic History   Marital status: Married    Spouse name: Not on file  Number of children: 1   Years of education: Not on file   Highest education level: Not on file  Occupational History   Occupation: Disabled  Tobacco Use   Smoking status: Never    Passive exposure: Never   Smokeless tobacco: Never  Vaping Use   Vaping status: Never Used  Substance and  Sexual Activity   Alcohol use: Yes    Comment: 1 drinks per week   Drug use: No   Sexual activity: Not on file    Comment: Married  Other Topics Concern   Not on file  Social History Narrative   Lives at home w/ his wife   Right-handed   Caffeine: 2-3 cups per day   Social Drivers of Health   Financial Resource Strain: Low Risk  (01/17/2023)   Overall Financial Resource Strain (CARDIA)    Difficulty of Paying Living Expenses: Not hard at all  Food Insecurity: No Food Insecurity (01/17/2023)   Hunger Vital Sign    Worried About Running Out of Food in the Last Year: Never true    Ran Out of Food in the Last Year: Never true  Transportation Needs: No Transportation Needs (01/17/2023)   PRAPARE - Administrator, Civil Service (Medical): No    Lack of Transportation (Non-Medical): No  Physical Activity: Sufficiently Active (01/17/2023)   Exercise Vital Sign    Days of Exercise per Week: 4 days    Minutes of Exercise per Session: 60 min  Stress: No Stress Concern Present (01/17/2023)   [employer] of Occupational Health - Occupational Stress Questionnaire    Feeling of Stress : Not at all  Social Connections: Moderately Integrated (01/17/2023)   Social Connection and Isolation Panel [NHANES]    Frequency of Communication with Friends and Family: More than three times a week    Frequency of Social Gatherings with Friends and Family: Twice a week    Attends Religious Services: More than 4 times per year    Active Member of [employer] or Organizations: No    Attends Banker Meetings: Never    Marital Status: Married  Catering manager Violence: Not At Risk (01/17/2023)   Humiliation, Afraid, Rape, and Kick questionnaire    Fear of Current or Ex-Partner: No    Emotionally Abused: No    Physically Abused: No    Sexually Abused: No    Family History  Problem Relation Age of Onset   Pneumonia Father    Tremor Neg Hx     Allergies as of 10/10/2023 - Review  Complete 10/10/2023  Allergen Reaction Noted   Gold-containing drug products Rash 05/03/1983    Current Outpatient Medications on File Prior to Visit  Medication Sig Dispense Refill   acetaminophen  (TYLENOL ) 500 MG tablet Take 1,000 mg by mouth every 8 (eight) hours as needed for mild pain or moderate pain.      amLODipine  (NORVASC ) 2.5 MG tablet Take 1 tablet (2.5 mg total) by mouth daily. 180 tablet 3   Ascorbic Acid (VITAMIN C PO) Take by mouth daily.     cholecalciferol (VITAMIN D ) 1000 units tablet Take 2,000 Units by mouth daily.      losartan  (COZAAR ) 25 MG tablet Take 1 tablet (25 mg total) by mouth daily. 90 tablet 1   Magnesium 250 MG TABS Take 250 mg by mouth daily.     TURMERIC PO Take 1,000 mg by mouth daily.     amoxicillin  (AMOXIL ) 500 MG capsule Take 4 capsules (2,000  mg total) by mouth 1 hour prior to appointment as directed. 12 capsule 2   cetirizine  (ZYRTEC ) 10 MG tablet Take 10 mg by mouth daily. (Patient not taking: Reported on 10/10/2023)     Evolocumab  (REPATHA  SURECLICK) 140 MG/ML SOAJ Inject 140 mg into the skin every 14 (fourteen) days. (Patient not taking: Reported on 10/10/2023) 6 mL 1   No current facility-administered medications on file prior to visit.   REVIEW OF SYSTEMS:  Review of Systems  Respiratory:  Negative for shortness of breath.   Cardiovascular:  Positive for leg swelling. Negative for chest pain and palpitations.  Musculoskeletal:  Negative for myalgias.  Neurological:  Negative for dizziness and tingling.   PHYSICAL EXAMINATION:  There were no vitals filed for this visit.  There is no height or weight on file to calculate BMI.  Physical Exam Musculoskeletal:        General: Swelling (bilateral lower extremity up to mid shin) present.  Skin:    Findings: No bruising or erythema.  Psychiatric:        Mood and Affect: Mood normal.        Behavior: Behavior normal.        Thought Content: Thought content normal.   Villalta Score for  Post-Thrombotic Syndrome: Pain: Absent Cramps: Absent Heaviness: Absent Paresthesia: Absent Pruritus: Absent Pretibial Edema: Mild Skin Induration: Absent Hyperpigmentation: Absent Redness: Absent Venous Ectasia: Absent Pain on calf compression: Absent Villalta Preliminary Score: 1 Is venous ulcer present?: No If venous ulcer is present and score is <15, then 15 points total are assigned: Absent Villalta Total Score: 1  LABS:  CBC     Component Value Date/Time   WBC 6.7 02/28/2023 1239   WBC 5.0 02/17/2021 1021   RBC 4.98 02/28/2023 1239   RBC 5.24 02/17/2021 1021   HGB 15.8 02/28/2023 1239   HCT 47.7 02/28/2023 1239   PLT 140 (L) 02/28/2023 1239   MCV 96 02/28/2023 1239   MCH 31.7 02/28/2023 1239   MCH 31.9 02/17/2021 1021   MCHC 33.1 02/28/2023 1239   MCHC 33.7 02/17/2021 1021   RDW 12.0 02/28/2023 1239   LYMPHSABS 1.5 09/12/2022 0808   MONOABS 0.4 07/19/2019 2035   EOSABS 0.1 09/12/2022 0808   BASOSABS 0.0 09/12/2022 0808    Hepatic Function      Component Value Date/Time   PROT 7.2 09/12/2022 0808   ALBUMIN 4.5 09/12/2022 0808   AST 19 09/12/2022 0808   ALT 49 (H) 09/12/2022 0808   ALKPHOS 65 09/12/2022 0808   BILITOT 0.8 09/12/2022 0808   BILIDIR 0.2 09/28/2016 1053    Renal Function   Lab Results  Component Value Date   CREATININE 0.92 02/28/2023   CREATININE 0.92 01/31/2023   CREATININE 0.94 09/12/2022    CrCl cannot be calculated (Patient's most recent lab result is older than the maximum 21 days allowed.).   [facility] Vascular Lab Studies:  10/10/23 VAS US  LOWER EXTREMITY VENOUS BILAT (DVT)  Summary:  RIGHT:  - No evidence of deep vein thrombosis in the lower extremity. No indirect  evidence of obstruction proximal to the inguinal ligament.  - No cystic structure found in the popliteal fossa.   LEFT:  - Findings consistent with age indeterminate deep vein thrombosis  involving the left femoral vein.  - No cystic structure found in the popliteal  fossa.  07/20/19 VAS US  LOWER EXTREMITY VENOUS LEFT (DVT) Summary:  RIGHT:  - No evidence of common femoral vein obstruction.   LEFT:  -  Findings consistent with acute deep vein thrombosis involving the left  femoral vein, left popliteal vein, left posterior tibial veins, and left  peroneal veins.   ASSESSMENT: Location of DVT: Left femoral vein Cause of DVT: provoked by a transient risk factor  Patient with prior history of provoked DVT and PE 07/2019 who is now s/p right foot great toe nail excision + left foot 2nd-4th hammertoe correction + 5th toe percutaneous tenotomy on 10/02/23. He was prescribed and is taking Eliquis  2.5 mg BID for DVT prevention. There was concern for possible new DVT during his post-op visit today due to bilateral lower extremity edema. Ultrasound showed nonocclusive age-indeterminate DVT in the left femoral vein. Discussed patient with Dr. Edgardo Goodwill who personally reviewed the images. It is possible that the thrombus seen on imaging today is residual chronic clot from his DVT in 2021. However, there's not enough evidence in the imaging to confirm that this is not new, and particularly in light of the patient's recent risk factors of surgery and decreased mobility, we decided to anticoagulate for a total of 3 months with Eliquis . We do not feel that the patient needs lifelong anticoagulation. Patient agreeable with this plan. Eliquis  was filled during his visit today with refills sent to his preferred pharmacy to complete 3 months. No concerns related to medication adherence or access at this time. Counseled patient on Eliquis , and all questions have been answered.   PLAN: -Start apixaban  (Eliquis ) 5 mg twice daily. -Expected duration of therapy: 3 months. Therapy started on 10/10/23. -Patient educated on purpose, proper use and potential adverse effects of apixaban  (Eliquis ). -Discussed importance of taking medication around the same time every day. -Advised patient of  medications to avoid (NSAIDs, aspirin  doses >100 mg daily). -Educated that Tylenol  (acetaminophen ) is the preferred analgesic to lower the risk of bleeding. -Advised patient to alert all providers of anticoagulation therapy prior to starting a new medication or having a procedure. -Emphasized importance of monitoring for signs and symptoms of bleeding (abnormal bruising, prolonged bleeding, nose bleeds, bleeding from gums, discolored urine, black tarry stools). -Educated patient to present to the ED if emergent signs and symptoms of new thrombosis occur. -Counseled patient to wear compression stockings daily, removing at night. Counseled on proper leg elevation.   Follow up: DVT Clinic available as needed.   Faye Hoops, PharmD, [place], CPP Deep Vein Thrombosis Clinic Clinical Pharmacist Practitioner [phone]

## 2023-10-11 ENCOUNTER — Other Ambulatory Visit (HOSPITAL_COMMUNITY): Payer: Self-pay

## 2023-10-18 ENCOUNTER — Other Ambulatory Visit (HOSPITAL_BASED_OUTPATIENT_CLINIC_OR_DEPARTMENT_OTHER): Payer: Self-pay

## 2023-10-18 MED ORDER — LOSARTAN POTASSIUM 25 MG PO TABS
25.0000 mg | ORAL_TABLET | Freq: Every day | ORAL | 1 refills | Status: DC
  Filled 2023-10-18: qty 90, 90d supply, fill #0
  Filled 2024-01-18: qty 90, 90d supply, fill #1

## 2023-10-25 ENCOUNTER — Other Ambulatory Visit: Payer: Self-pay | Admitting: Family Medicine

## 2023-10-25 DIAGNOSIS — E785 Hyperlipidemia, unspecified: Secondary | ICD-10-CM

## 2023-11-01 ENCOUNTER — Telehealth: Payer: Self-pay | Admitting: Student-PharmD

## 2023-11-01 MED ORDER — APIXABAN 5 MG PO TABS
5.0000 mg | ORAL_TABLET | Freq: Two times a day (BID) | ORAL | 0 refills | Status: AC
Start: 2023-11-01 — End: ?

## 2023-11-01 NOTE — Telephone Encounter (Signed)
 Informed patient samples are ready for pick up in clinic. Patient will likely come by today or tomorrow to pick these up.

## 2023-11-01 NOTE — Telephone Encounter (Signed)
 Patient called reporting difficulty affording Eliquis  refills. We are treating him for a total of 3 months for DVT and he is nearing the end of the first month. No options for patient assistance or copay cards. Since he is taking this for only a limited amount of time, will provide medication samples to complete course of treatment.   Medication Samples have been provided to the patient.  Drug name: Eliquis        Strength: 5 mg        Qty: 112 tablets (56 day supply)  LOT: [id number]  Exp.Date: 03/01/24  Dosing instructions: Take 5 mg (1 tablet) by mouth twice daily to complete 3 months of treatment.  The patient has been instructed regarding the correct time, dose, and frequency of taking this medication, including desired effects and most common side effects.   Lum KATHEE Herald 1:24 PM 11/01/2023

## 2023-11-02 NOTE — Telephone Encounter (Signed)
Samples have been picked up 

## 2023-11-14 NOTE — Progress Notes (Signed)
 Office Visit Note  Patient: [name]             Date of Birth: Aug 23, 1965           MRN: [id number]             PCP: Hughie Sharper, MD Referring: de [place], Raymond J, MD Visit Date: 11/28/2023 Occupation: @GUAROCC @  Subjective:  Joint stiffness  History of Present Illness: [name] is a 58 y.o. male with psoriatic arthritis and osteoarthritis.  He states he had surgery on October 02, 2023 for straightening of the left 2nd, 3rd and 4th toe by Dr. Barton.  Patient states he was immobilized after the surgery.  He developed a new DVT in his left lower extremity and was placed on Eliquis  for total of 3 months.  He reports some tightness in the lower back.  None of the other joints are painful.  He denies any joint swelling.  He denies any discomfort from total hip replacement.  He denies any active psoriasis.    Activities of Daily Living:  Patient reports morning stiffness for less than 5 minutes.   Patient Denies nocturnal pain.  Difficulty dressing/grooming: Denies Difficulty climbing stairs: Denies Difficulty getting out of chair: Denies Difficulty using hands for taps, buttons, cutlery, and/or writing: Denies  Review of Systems  Constitutional:  Positive for fatigue.  HENT:  Negative for mouth sores and mouth dryness.   Eyes:  Negative for dryness.  Respiratory:  Negative for shortness of breath.   Cardiovascular:  Positive for chest pain. Negative for palpitations.  Gastrointestinal:  Negative for blood in stool, constipation and diarrhea.  Endocrine: Negative for increased urination.  Genitourinary:  Negative for involuntary urination.  Musculoskeletal:  Positive for joint pain, joint pain, myalgias, muscle weakness, morning stiffness and myalgias. Negative for gait problem, joint swelling and muscle tenderness.  Skin:  Negative for color change, rash, hair loss and sensitivity to sunlight.  Allergic/Immunologic: Negative for susceptible to infections.   Neurological:  Negative for dizziness and headaches.  Hematological:  Negative for swollen glands.  Psychiatric/Behavioral:  Negative for depressed mood and sleep disturbance. The patient is not nervous/anxious.     PMFS History:  Patient Active Problem List   Diagnosis Date Noted   Chest discomfort 01/26/2023   Wellness examination 09/19/2022   Low back pain 07/13/2022   Corns and callosities 01/15/2020   Hammer toes, bilateral 01/15/2020   Orthopnea 11/01/2019   History of COVID-19 07/30/2019   Acute deep vein thrombosis (DVT) of left lower extremity (HCC) 07/30/2019   Abnormal CT of the chest 07/30/2019   Cough 07/30/2019   Leg DVT (deep venous thromboembolism), acute, bilateral (HCC) 07/24/2019   Essential hypertension 07/24/2019   COVID-19 virus infection 07/20/2019   Acute respiratory failure with hypoxia (HCC) 07/20/2019   Acute pulmonary embolism (HCC) 07/19/2019   Essential tremor 04/03/2017   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/2016   High risk medications (not anticoagulants) long-term use 09/20/2016   Bilateral hand pain 09/20/2016   DDD (degenerative disc disease), cervical 09/20/2016   History of total hip replacement, bilateral 09/20/2016   DDD (degenerative disc disease), lumbar 09/20/2016   Bilateral plantar fasciitis 09/20/2016   Psoriasis 09/20/2016   Primary osteoarthritis of both feet 09/20/2016   Primary osteoarthritis of both hands 09/20/2016   Primary osteoarthritis of both knees 09/20/2016   Prediabetes 09/22/2014   Hyperlipidemia 09/22/2014   URI (upper respiratory infection) 07/07/2012   Acute bronchitis 07/07/2012   OBESITY  07/03/2008   AVASCULAR NECROSIS, FEMORAL HEAD 07/03/2008   Psoriatic arthritis (HCC) 05/02/2001    Past Medical History:  Diagnosis Date   Anemia    History of avascular necrosis of capital femoral epiphysis 2006, 2007   Bilateral Femoral Head   HLD (hyperlipidemia)    Pneumonia    twice   Psoriatic arthritis  (HCC)     Family History  Problem Relation Age of Onset   Pneumonia Father    Tremor Neg Hx    Past Surgical History:  Procedure Laterality Date   CARPAL TUNNEL RELEASE Right 08/2022   JOINT REPLACEMENT Bilateral 2006, 2007   Necrosis femoral head (bilateral)   TOE SURGERY Left 10/02/2023   surgery to straighten toes on left foot   TOTAL HIP REVISION Right 11/23/2016   Procedure: Acetabulum liner and femoral head revision;  Surgeon: Melodi Lerner, MD;  Location: [facility];  Service: Orthopedics;  Laterality: Right;   TOTAL HIP REVISION Left 04/10/2019   Procedure: Left hip bearing surface vs total hip arthroplasty revision;  Surgeon: Melodi Lerner, MD;  Location: [facility];  Service: Orthopedics;  Laterality: Left;    Social History   Social History Narrative   Lives at home w/ his wife   Right-handed   Caffeine: 2-3 cups per day   Immunization History  Administered Date(s) Administered   Fluzone Influenza virus vaccine,trivalent (IIV3), split virus 02/03/2017, 03/21/2018   Influenza Inj Mdck Quad Pf 02/01/2019   Influenza, Seasonal, Injecte, Preservative Fre 01/17/2023   Influenza,inj,Quad PF,6+ Mos 02/03/2017, 03/21/2018, 01/06/2021   Influenza,inj,quad, With Preservative 02/01/2019   PFIZER(Purple Top)SARS-COV-2 Vaccination 10/06/2019, 10/27/2019   Pneumococcal Conjugate-13 03/21/2018   Tdap 09/22/2014   Zoster Recombinant(Shingrix ) 04/02/2018, 05/23/2018     Objective: Vital Signs: BP (!) 147/80 (BP Location: Left Arm, Patient Position: Sitting, Cuff Size: Normal)   Resp 14   Ht 5' 9 (1.753 m)   Wt 186 lb (84.4 kg)   BMI 27.47 kg/m    Physical Exam Vitals and nursing note reviewed.  Constitutional:      Appearance: He is well-developed.  HENT:     Head: Normocephalic and atraumatic.  Eyes:     Conjunctiva/sclera: Conjunctivae normal.     Pupils: Pupils are equal, round, and reactive to light.  Cardiovascular:     Rate and Rhythm: Normal rate and  regular rhythm.     Heart sounds: Normal heart sounds.  Pulmonary:     Effort: Pulmonary effort is normal.     Breath sounds: Normal breath sounds.  Abdominal:     General: Bowel sounds are normal.     Palpations: Abdomen is soft.  Musculoskeletal:     Cervical back: Normal range of motion and neck supple.  Skin:    General: Skin is warm and dry.     Capillary Refill: Capillary refill takes less than 2 seconds.  Neurological:     Mental Status: He is alert and oriented to person, place, and time.  Psychiatric:        Behavior: Behavior normal.      Musculoskeletal Exam: He had limited range of motion of the cervical spine on flexion extension and lateral rotation without discomfort.  Thoracic kyphosis with limited range of motion was noted.  He had limited range of motion of the lumbar spine.  Mild tenderness over right SI joint.  Shoulders were in good range of motion.  He had contractures of both elbows with no synovitis.  There was no synovitis of the  wrist joints, MCPs and PIPs and DIPs.  He did not complete fist formation due to PIP and DIP fusion.  Hip joints were replaced and were in good range of motion.  Knee joints in good range of motion without any warmth swelling or effusion.  There was no tenderness over ankles.  Hammertoes were noted bilaterally with postsurgical changes noted in his left 2nd, 3rd and 4th toe.  No synovitis was noted.  No Achilles tendinitis or plantar fasciitis was noted.  CDAI Exam: CDAI Score: -- Patient Global: --; Provider Global: -- Swollen: --; Tender: -- Joint Exam 11/28/2023   No joint exam has been documented for this visit   There is currently no information documented on the homunculus. Go to the Rheumatology activity and complete the homunculus joint exam.  Investigation: No additional findings.  Imaging: No results found.  Recent Labs: Lab Results  Component Value Date   WBC 6.7 02/28/2023   HGB 15.8 02/28/2023   PLT 140 (L)  02/28/2023   NA 142 02/28/2023   K 4.7 02/28/2023   CL 103 02/28/2023   CO2 25 02/28/2023   GLUCOSE 83 02/28/2023   BUN 19 02/28/2023   CREATININE 0.92 02/28/2023   BILITOT 0.8 09/12/2022   ALKPHOS 65 09/12/2022   AST 19 09/12/2022   ALT 49 (H) 09/12/2022   PROT 7.2 09/12/2022   ALBUMIN 4.5 09/12/2022   CALCIUM 9.8 02/28/2023   GFRAA 94 09/16/2020   QFTBGOLDPLUS NEGATIVE 02/17/2021    Speciality Comments: No specialty comments available.  Procedures:  No procedures performed Allergies: Gold-containing drug products   Assessment / Plan:     Visit Diagnoses: Psoriatic arthritis (HCC) -he had no active synovitis on the examination.  Patient discontinued Enbrel  in November 2022 due to frequent upper respiratory tract infection.  He did not notice any worsening of his symptoms.  He continues to have multiple contractures without any synovitis.  X-rays updated 05/31/23 erosive inflammatory arthritis and osteoarthritis overlap.  No radiographic progression was noted when compared to the x-rays of 2021.  I advised him to contact me if he develops joint inflammation or swelling.  Psoriasis-he denies any active psoriasis.  No psoriasis patches were noted on the examination.  High risk medication use - Patient discontinued Enbrel  in November 2022 due to frequent upper respiratory tract infections.  Primary osteoarthritis of both hands-he has osteoarthritis and psoriatic arthritis overlap with limited range of motion of several of the PIP and DIP joints and incomplete fist formation.  No synovitis was noted.  Contracture of joint of both elbows-unchanged without any synovitis.  Right hand paresthesia-resolved after carpal tunnel release.  History of total hip replacement, bilateral-8 good range of motion without discomfort.  Primary osteoarthritis of both knees-no warmth swelling or effusion was noted.  Primary osteoarthritis of both feet-he had left 2nd, 3rd and 4th toe surgery by Dr.  Barton.  He is gradually recovering from it.  DDD (degenerative disc disease), cervical-he continues to have limited range of motion of the cervical spine and stiffness.  A handout on neck exercises was given.  Lumbar spondylosis-he has limited range of motion of the lumbar spine.  He has been experiencing more stiffness.  A handout on back exercises was given.  Benefits of swimming and water aerobics were discussed.  History of pulmonary embolism - treated with Eliquis  in 2021 after COVID-19 virus infection.  Patient states he recently developed a DVT in his left lower extremity after the surgery.  He is currently on  Eliquis  for total of 3 months.  History of hypertension-blood pressure was elevated at 147/80.  Repeat blood pressure was 135/75.  He was advised to monitor blood pressure closely.  History of hyperlipidemia  History of prediabetes  History of vitamin D  deficiency  History of COVID-19  Orders: No orders of the defined types were placed in this encounter.  No orders of the defined types were placed in this encounter.    Follow-Up Instructions: Return in about 6 months (around 05/30/2024) for Psoriatic arthritis, Osteoarthritis.   Maya Nash, MD  Note - This record has been created using Animal nutritionist.  Chart creation errors have been sought, but may not always  have been located. Such creation errors do not reflect on  the standard of medical care.

## 2023-11-24 DIAGNOSIS — M2042 Other hammer toe(s) (acquired), left foot: Secondary | ICD-10-CM | POA: Diagnosis not present

## 2023-11-24 DIAGNOSIS — M2041 Other hammer toe(s) (acquired), right foot: Secondary | ICD-10-CM | POA: Diagnosis not present

## 2023-11-28 ENCOUNTER — Encounter: Payer: Self-pay | Admitting: Rheumatology

## 2023-11-28 ENCOUNTER — Ambulatory Visit: Payer: Medicare HMO | Attending: Rheumatology | Admitting: Rheumatology

## 2023-11-28 VITALS — BP 135/75 | HR 84 | Resp 14 | Ht 69.0 in | Wt 186.0 lb

## 2023-11-28 DIAGNOSIS — M170 Bilateral primary osteoarthritis of knee: Secondary | ICD-10-CM

## 2023-11-28 DIAGNOSIS — M24521 Contracture, right elbow: Secondary | ICD-10-CM | POA: Diagnosis not present

## 2023-11-28 DIAGNOSIS — M19071 Primary osteoarthritis, right ankle and foot: Secondary | ICD-10-CM | POA: Diagnosis not present

## 2023-11-28 DIAGNOSIS — M5030 Other cervical disc degeneration, unspecified cervical region: Secondary | ICD-10-CM

## 2023-11-28 DIAGNOSIS — Z8679 Personal history of other diseases of the circulatory system: Secondary | ICD-10-CM | POA: Diagnosis not present

## 2023-11-28 DIAGNOSIS — Z96643 Presence of artificial hip joint, bilateral: Secondary | ICD-10-CM

## 2023-11-28 DIAGNOSIS — L409 Psoriasis, unspecified: Secondary | ICD-10-CM | POA: Diagnosis not present

## 2023-11-28 DIAGNOSIS — Z8616 Personal history of COVID-19: Secondary | ICD-10-CM

## 2023-11-28 DIAGNOSIS — R202 Paresthesia of skin: Secondary | ICD-10-CM

## 2023-11-28 DIAGNOSIS — Z86711 Personal history of pulmonary embolism: Secondary | ICD-10-CM

## 2023-11-28 DIAGNOSIS — M47816 Spondylosis without myelopathy or radiculopathy, lumbar region: Secondary | ICD-10-CM

## 2023-11-28 DIAGNOSIS — M24522 Contracture, left elbow: Secondary | ICD-10-CM

## 2023-11-28 DIAGNOSIS — M19072 Primary osteoarthritis, left ankle and foot: Secondary | ICD-10-CM

## 2023-11-28 DIAGNOSIS — Z87898 Personal history of other specified conditions: Secondary | ICD-10-CM

## 2023-11-28 DIAGNOSIS — M19041 Primary osteoarthritis, right hand: Secondary | ICD-10-CM | POA: Diagnosis not present

## 2023-11-28 DIAGNOSIS — L4050 Arthropathic psoriasis, unspecified: Secondary | ICD-10-CM

## 2023-11-28 DIAGNOSIS — Z79899 Other long term (current) drug therapy: Secondary | ICD-10-CM

## 2023-11-28 DIAGNOSIS — M19042 Primary osteoarthritis, left hand: Secondary | ICD-10-CM

## 2023-11-28 DIAGNOSIS — Z8639 Personal history of other endocrine, nutritional and metabolic disease: Secondary | ICD-10-CM

## 2023-11-28 NOTE — Patient Instructions (Addendum)
 Cervical Strain and Sprain Rehab Ask your health care provider which exercises are safe for you. Do exercises exactly as told by your health care provider and adjust them as directed. It is normal to feel mild stretching, pulling, tightness, or discomfort as you do these exercises. Stop right away if you feel sudden pain or your pain gets worse. Do not begin these exercises until told by your health care provider. Stretching and range-of-motion exercises Cervical side bending  Using good posture, sit on a stable chair or stand up. Without moving your shoulders, slowly tilt your left / right ear to your shoulder until you feel a stretch in the neck muscles on the opposite side. You should be looking straight ahead. Hold for __________ seconds. Repeat with the other side of your neck. Repeat __________ times. Complete this exercise __________ times a day. Cervical rotation  Using good posture, sit on a stable chair or stand up. Slowly turn your head to the side as if you are looking over your left / right shoulder. Keep your eyes level with the ground. Stop when you feel a stretch along the side and the back of your neck. Hold for __________ seconds. Repeat this by turning to your other side. Repeat __________ times. Complete this exercise __________ times a day. Thoracic extension and pectoral stretch  Roll a towel or a small blanket so it is about 4 inches (10 cm) in diameter. Lie down on your back on a firm surface. Put the towel in the middle of your back across your spine. It should not be under your shoulder blades. Put your hands behind your head and let your elbows fall out to your sides. Hold for __________ seconds. Repeat __________ times. Complete this exercise __________ times a day. Strengthening exercises Upper cervical flexion  Lie on your back with a thin pillow behind your head or a small, rolled-up towel under your neck. Gently tuck your chin toward your chest and nod  your head down to look toward your feet. Do not lift your head off the pillow. Hold for __________ seconds. Release the tension slowly. Relax your neck muscles completely before you repeat this exercise. Repeat __________ times. Complete this exercise __________ times a day. Cervical extension  Stand about 6 inches (15 cm) away from a wall, with your back facing the wall. Place a soft object, about 6-8 inches (15-20 cm) in diameter, between the back of your head and the wall. A soft object could be a small pillow, a ball, or a folded towel. Gently tilt your head back and press into the soft object. Keep your jaw and forehead relaxed. Hold for __________ seconds. Release the tension slowly. Relax your neck muscles completely before you repeat this exercise. Repeat __________ times. Complete this exercise __________ times a day. Posture and body mechanics Body mechanics refer to the movements and positions of your body while you do your daily activities. Posture is part of body mechanics. Good posture and healthy body mechanics can help to relieve stress in your body's tissues and joints. Good posture means that your spine is in its natural S-curve position (your spine is neutral), your shoulders are pulled back slightly, and your head is not tipped forward. The following are general guidelines for using improved posture and body mechanics in your everyday activities. Sitting  When sitting, keep your spine neutral and keep your feet flat on the floor. Use a footrest, if needed, and keep your thighs parallel to the floor. Avoid rounding  your shoulders. Avoid tilting your head forward. When working at a desk or a computer, keep your desk at a height where your hands are slightly lower than your elbows. Slide your chair under your desk so you are close enough to maintain good posture. When working at a computer, place your monitor at a height where you are looking straight ahead and you do not have to  tilt your head forward or downward to look at the screen. Standing  When standing, keep your spine neutral and keep your feet about hip-width apart. Keep a slight bend in your knees. Your ears, shoulders, and hips should line up. When you do a task in which you stand in one place for a long time, place one foot up on a stable object that is 2-4 inches (5-10 cm) high, such as a footstool. This helps keep your spine neutral. Resting When lying down and resting, avoid positions that are most painful for you. Try to support your neck in a neutral position. You can use a contour pillow or a small rolled-up towel. Your pillow should support your neck but not push on it. This information is not intended to replace advice given to you by your health care provider. Make sure you discuss any questions you have with your health care provider. Document Revised: 08/22/2022 Document Reviewed: 11/08/2021 [employer] Patient Education  2024 [employer] Low Back Sprain or Strain Rehab Ask your health care provider which exercises are safe for you. Do exercises exactly as told by your health care provider and adjust them as directed. It is normal to feel mild stretching, pulling, tightness, or discomfort as you do these exercises. Stop right away if you feel sudden pain or your pain gets worse. Do not begin these exercises until told by your health care provider. Stretching and range-of-motion exercises These exercises warm up your muscles and joints and improve the movement and flexibility of your back. These exercises also help to relieve pain, numbness, and tingling. Lumbar rotation  Lie on your back on a firm bed or the floor with your knees bent. Straighten your arms out to your sides so each arm forms a 90-degree angle (right angle) with a side of your body. Slowly move (rotate) both of your knees to one side of your body until you feel a stretch in your lower back (lumbar). Try not to let your shoulders lift  off the floor. Hold this position for __________ seconds. Tense your abdominal muscles and slowly move your knees back to the starting position. Repeat this exercise on the other side of your body. Repeat __________ times. Complete this exercise __________ times a day. Single knee to chest  Lie on your back on a firm bed or the floor with both legs straight. Bend one of your knees. Use your hands to move your knee up toward your chest until you feel a gentle stretch in your lower back and buttock. Hold your leg in this position by holding on to the front of your knee. Keep your other leg as straight as possible. Hold this position for __________ seconds. Slowly return to the starting position. Repeat with your other leg. Repeat __________ times. Complete this exercise __________ times a day. Prone extension on elbows  Lie on your abdomen on a firm bed or the floor (prone position). Prop yourself up on your elbows. Use your arms to help lift your chest up until you feel a gentle stretch in your abdomen and your lower back.  This will place some of your body weight on your elbows. If this is uncomfortable, try stacking pillows under your chest. Your hips should stay down, against the surface that you are lying on. Keep your hip and back muscles relaxed. Hold this position for __________ seconds. Slowly relax your upper body and return to the starting position. Repeat __________ times. Complete this exercise __________ times a day. Strengthening exercises These exercises build strength and endurance in your back. Endurance is the ability to use your muscles for a long time, even after they get tired. Pelvic tilt This exercise strengthens the muscles that lie deep in the abdomen. Lie on your back on a firm bed or the floor with your legs extended. Bend your knees so they are pointing toward the ceiling and your feet are flat on the floor. Tighten your lower abdominal muscles to press your  lower back against the floor. This motion will tilt your pelvis so your tailbone points up toward the ceiling instead of pointing to your feet or the floor. To help with this exercise, you may place a small towel under your lower back and try to push your back into the towel. Hold this position for __________ seconds. Let your muscles relax completely before you repeat this exercise. Repeat __________ times. Complete this exercise __________ times a day. Alternating arm and leg raises  Get on your hands and knees on a firm surface. If you are on a hard floor, you may want to use padding, such as an exercise mat, to cushion your knees. Line up your arms and legs. Your hands should be directly below your shoulders, and your knees should be directly below your hips. Lift your left leg behind you. At the same time, raise your right arm and straighten it in front of you. Do not lift your leg higher than your hip. Do not lift your arm higher than your shoulder. Keep your abdominal and back muscles tight. Keep your hips facing the ground. Do not arch your back. Keep your balance carefully, and do not hold your breath. Hold this position for __________ seconds. Slowly return to the starting position. Repeat with your right leg and your left arm. Repeat __________ times. Complete this exercise __________ times a day. Abdominal set with straight leg raise  Lie on your back on a firm bed or the floor. Bend one of your knees and keep your other leg straight. Tense your abdominal muscles and lift your straight leg up, 4-6 inches (10-15 cm) off the ground. Keep your abdominal muscles tight and hold this position for __________ seconds. Do not hold your breath. Do not arch your back. Keep it flat against the ground. Keep your abdominal muscles tense as you slowly lower your leg back to the starting position. Repeat with your other leg. Repeat __________ times. Complete this exercise __________ times a  day. Single leg lower with bent knees Lie on your back on a firm bed or the floor. Tense your abdominal muscles and lift your feet off the floor, one foot at a time, so your knees and hips are bent in 90-degree angles (right angles). Your knees should be over your hips and your lower legs should be parallel to the floor. Keeping your abdominal muscles tense and your knee bent, slowly lower one of your legs so your toe touches the ground. Lift your leg back up to return to the starting position. Do not hold your breath. Do not let your back arch. Keep  your back flat against the ground. Repeat with your other leg. Repeat __________ times. Complete this exercise __________ times a day. Posture and body mechanics Good posture and healthy body mechanics can help to relieve stress in your body's tissues and joints. Body mechanics refers to the movements and positions of your body while you do your daily activities. Posture is part of body mechanics. Good posture means: Your spine is in its natural S-curve position (neutral). Your shoulders are pulled back slightly. Your head is not tipped forward (neutral). Follow these guidelines to improve your posture and body mechanics in your everyday activities. Standing  When standing, keep your spine neutral and your feet about hip-width apart. Keep a slight bend in your knees. Your ears, shoulders, and hips should line up. When you do a task in which you stand in one place for a long time, place one foot up on a stable object that is 2-4 inches (5-10 cm) high, such as a footstool. This helps keep your spine neutral. Sitting  When sitting, keep your spine neutral and keep your feet flat on the floor. Use a footrest, if necessary, and keep your thighs parallel to the floor. Avoid rounding your shoulders, and avoid tilting your head forward. When working at a desk or a computer, keep your desk at a height where your hands are slightly lower than your elbows.  Slide your chair under your desk so you are close enough to maintain good posture. When working at a computer, place your monitor at a height where you are looking straight ahead and you do not have to tilt your head forward or downward to look at the screen. Resting When lying down and resting, avoid positions that are most painful for you. If you have pain with activities such as sitting, bending, stooping, or squatting, lie in a position in which your body does not bend very much. For example, avoid curling up on your side with your arms and knees near your chest (fetal position). If you have pain with activities such as standing for a long time or reaching with your arms, lie with your spine in a neutral position and bend your knees slightly. Try the following positions: Lying on your side with a pillow between your knees. Lying on your back with a pillow under your knees. Lifting  When lifting objects, keep your feet at least shoulder-width apart and tighten your abdominal muscles. Bend your knees and hips and keep your spine neutral. It is important to lift using the strength of your legs, not your back. Do not lock your knees straight out. Always ask for help to lift heavy or awkward objects. This information is not intended to replace advice given to you by your health care provider. Make sure you discuss any questions you have with your health care provider. Document Revised: 08/22/2022 Document Reviewed: 07/06/2020 [employer] Patient Education  2024 [employer].

## 2023-12-14 ENCOUNTER — Other Ambulatory Visit (HOSPITAL_BASED_OUTPATIENT_CLINIC_OR_DEPARTMENT_OTHER): Payer: Self-pay

## 2024-01-18 ENCOUNTER — Other Ambulatory Visit (HOSPITAL_BASED_OUTPATIENT_CLINIC_OR_DEPARTMENT_OTHER): Payer: Self-pay

## 2024-03-26 ENCOUNTER — Other Ambulatory Visit: Payer: Self-pay

## 2024-03-26 ENCOUNTER — Other Ambulatory Visit (HOSPITAL_BASED_OUTPATIENT_CLINIC_OR_DEPARTMENT_OTHER): Payer: Self-pay

## 2024-03-26 MED ORDER — AMLODIPINE BESYLATE 2.5 MG PO TABS
2.5000 mg | ORAL_TABLET | Freq: Every day | ORAL | 1 refills | Status: AC
  Filled 2024-03-26: qty 90, 90d supply, fill #0

## 2024-03-26 MED ORDER — LOSARTAN POTASSIUM 25 MG PO TABS
25.0000 mg | ORAL_TABLET | Freq: Every day | ORAL | 1 refills | Status: AC
  Filled 2024-04-22: qty 90, 90d supply, fill #0

## 2024-04-10 ENCOUNTER — Other Ambulatory Visit (HOSPITAL_BASED_OUTPATIENT_CLINIC_OR_DEPARTMENT_OTHER): Payer: Self-pay

## 2024-04-10 MED ORDER — TRIAMCINOLONE ACETONIDE 0.025 % EX CREA
1.0000 | TOPICAL_CREAM | Freq: Two times a day (BID) | CUTANEOUS | 6 refills | Status: AC | PRN
  Filled 2024-04-10: qty 15, 14d supply, fill #0

## 2024-04-11 NOTE — Progress Notes (Signed)
 [name]                                          MRN: [id number]   04/11/2024   The [facility] Quality Team Specialist reviewed this patient medical record for the purposes of chart review for care gap closure. The following were reviewed: abstraction for care gap closure-controlling blood pressure.    [facility] Quality Team

## 2024-04-22 ENCOUNTER — Other Ambulatory Visit (HOSPITAL_BASED_OUTPATIENT_CLINIC_OR_DEPARTMENT_OTHER): Payer: Self-pay

## 2024-05-07 ENCOUNTER — Ambulatory Visit: Admitting: Physical Therapy

## 2024-05-09 ENCOUNTER — Ambulatory Visit: Attending: Family Medicine | Admitting: Physical Therapy

## 2024-05-09 ENCOUNTER — Other Ambulatory Visit: Payer: Self-pay

## 2024-05-09 ENCOUNTER — Encounter: Payer: Self-pay | Admitting: Physical Therapy

## 2024-05-09 DIAGNOSIS — R2681 Unsteadiness on feet: Secondary | ICD-10-CM | POA: Diagnosis not present

## 2024-05-09 DIAGNOSIS — M4802 Spinal stenosis, cervical region: Secondary | ICD-10-CM | POA: Insufficient documentation

## 2024-05-09 DIAGNOSIS — M542 Cervicalgia: Secondary | ICD-10-CM | POA: Insufficient documentation

## 2024-05-09 DIAGNOSIS — M47812 Spondylosis without myelopathy or radiculopathy, cervical region: Secondary | ICD-10-CM | POA: Insufficient documentation

## 2024-05-09 DIAGNOSIS — M6281 Muscle weakness (generalized): Secondary | ICD-10-CM | POA: Insufficient documentation

## 2024-05-09 NOTE — Therapy (Signed)
 " OUTPATIENT PHYSICAL THERAPY CERVICAL and BALANCE EVALUATION   Patient Name: [name] MRN: [id number] DOB:[birth year], 59 y.o., male Today's Date: 05/09/2024  END OF SESSION:  PT End of Session - 05/09/24 1101     Visit Number 1    Date for Recertification  07/04/24    Authorization Type [employer]    Progress Note Due on Visit 10    PT Start Time 1101    PT Stop Time 1144    PT Time Calculation (min) 43 min    Activity Tolerance Patient tolerated treatment well          Past Medical History:  Diagnosis Date   Anemia    History of avascular necrosis of capital femoral epiphysis 2006, 2007   Bilateral Femoral Head   HLD (hyperlipidemia)    Pneumonia    twice   Psoriatic arthritis [facility])    Past Surgical History:  Procedure Laterality Date   CARPAL TUNNEL RELEASE Right 08/2022   JOINT REPLACEMENT Bilateral 2006, 2007   Necrosis femoral head (bilateral)   TOE SURGERY Left 10/02/2023   surgery to straighten toes on left foot   TOTAL HIP REVISION Right 11/23/2016   Procedure: Acetabulum liner and femoral head revision;  Surgeon: Melodi Lerner, MD;  Location: [facility];  Service: Orthopedics;  Laterality: Right;   TOTAL HIP REVISION Left 04/10/2019   Procedure: Left hip bearing surface vs total hip arthroplasty revision;  Surgeon: Melodi Lerner, MD;  Location: [facility];  Service: Orthopedics;  Laterality: Left;    Patient Active Problem List   Diagnosis Date Noted   Chest discomfort 01/26/2023   Wellness examination 09/19/2022   Low back pain 07/13/2022   Corns and callosities 01/15/2020   Hammer toes, bilateral 01/15/2020   Orthopnea 11/01/2019   History of COVID-19 07/30/2019   Acute deep vein thrombosis (DVT) of left lower extremity (HCC) 07/30/2019   Abnormal CT of the chest 07/30/2019   Cough 07/30/2019   Leg DVT (deep venous thromboembolism), acute, bilateral (HCC) 07/24/2019   Essential hypertension 07/24/2019   COVID-19 virus infection 07/20/2019    Acute respiratory failure with hypoxia (HCC) 07/20/2019   Acute pulmonary embolism (HCC) 07/19/2019   Essential tremor 04/03/2017   Failed total hip arthroplasty 11/23/2016   High risk medications (not anticoagulants) long-term use 09/20/2016   Bilateral hand pain 09/20/2016   DDD (degenerative disc disease), cervical 09/20/2016   History of total hip replacement, bilateral 09/20/2016   DDD (degenerative disc disease), lumbar 09/20/2016   Bilateral plantar fasciitis 09/20/2016   Psoriasis 09/20/2016   Primary osteoarthritis of both feet 09/20/2016   Primary osteoarthritis of both hands 09/20/2016   Primary osteoarthritis of both knees 09/20/2016   Prediabetes 09/22/2014   Hyperlipidemia 09/22/2014   URI (upper respiratory infection) 07/07/2012   Acute bronchitis 07/07/2012   OBESITY 07/03/2008   AVASCULAR NECROSIS, FEMORAL HEAD 07/03/2008   Psoriatic arthritis (HCC) 05/02/2001    PCP: Hughie Sharper MD  REFERRING PROVIDER: Hughie Sharper MD  REFERRING DIAG: cervical spondylosis and stenosis; gait instability  THERAPY DIAG:  Cervicalgia  Gait instability  Muscle weakness (generalized)  Rationale for Evaluation and Treatment: Rehabilitation  ONSET DATE: worsening in the last few weeks  SUBJECTIVE:  SUBJECTIVE STATEMENT: Per MD note: myofascial neck pain, consider dry needling, balance retraining.   Patient complains of left neck pain and ear pain. Present for a long time but worsened over the last few weeks. Also states his balance is bad.  I had hammer toe surgery and that throws me backward when I wear my slides. My good shoes (wearing today) is better for balance but hurts my toes on the side I haven't had surgery on.   No falls.  When rising from a chair may fall back.  My  neck pain has increased since I saw Dr. Hughie.    Saw Camie Rash for PT at [facility] for neck pain years ago and it helped.  Had DN.   I go to the gym and do weights mostly upper body 2x/week   Hand dominance: Right  PERTINENT HISTORY:  Chronic back pain  Bil THR revisions 2018 and 2020 Psoriatic arthritis and OA June 2025 multiple toe surgery with DVT  PAIN:   Are you having pain? Yes NPRS scale: 8-9/10 Pain location: left ear pain; left neck  Pain orientation: Left  PAIN TYPE: aching Pain description: intermittent  Aggravating factors: looking down at my phone, looking down to wash dishes; wearing slide shoes affects balance (falls backward); rising from chair sometimes falls back Relieving factors: feels good to turn head or look up; wearing supportive orthopedic shoes makes my balance better   PRECAUTIONS: Fall   WEIGHT BEARING RESTRICTIONS: No  FALLS:  Has patient fallen in last 6 months? No  OCCUPATION: on disability   PLOF: Independent with basic ADLs  PATIENT GOALS: less pain and more mobility    OBJECTIVE:  Note: Objective measures were completed at Evaluation unless otherwise noted.   PATIENT SURVEYS:  ABC scale: The Activities-Specific Balance Confidence (ABC) Scale 0% 10 20 30  40 50 60 70 80 90 100% No confidence<->completely confident  How confident are you that you will not lose your balance or become unsteady when you . . .   Date tested   05/09/24 Pt rated these as if wearing his slides since he spends a lot of time wearing these  Walk around the house 80%  2. Walk up or down stairs 90%  3. Bend over and pick up a slipper from in front of a closet floor 90%  4. Reach for a small can off a shelf at eye level 90%  5. Stand on tip toes and reach for something above your head 70%  6. Stand on a chair and reach for something 80%  7. Sweep the floor 80%  8. Walk outside the house to a car parked in the driveway 90%  9. Get into or out of a car  90%  10. Walk across a parking lot to the mall 70%  11. Walk up or down a ramp 70%  12. Walk in a crowded mall where people rapidly walk past you 80%  13. Are bumped into by people as you walk through the mall 70%  14. Step onto or off of an escalator while you are holding onto the railing 70%  15. Step onto or off an escalator while holding onto parcels such that you cannot hold onto the railing 60%  16. Walk outside on icy sidewalks 50%  Total: #/16 76.8    NDI:  NECK DISABILITY INDEX  Date: 1/8 Score  Pain intensity 1 = The pain is very mild at the moment  2. Personal care (washing, dressing, etc.)  1 =  I can look after myself normally but it causes extra pain  3. Lifting 0 =  I can lift heavy weights without extra pain  4. Reading 2 =  I can read as much as I want with moderate pain in my neck  5. Headaches 0 = I have no headaches at all  6. Concentration 0 =  I can concentrate fully when I want to with no difficulty  7. Work 0 =  I can do as much work as I want to  8. Driving 2 =  I can drive my car as long as I want with moderate pain in my neck  9. Sleeping 2 = My sleep is mildly disturbed (1-2 hrs sleepless)  10. Recreation 2 = I am able to engage in most, but not all of my usual recreation activities because of   pain in my neck  Total 16%   Minimum Detectable Change (90% confidence): 5 points or 10% points  COGNITION: Overall cognitive status: Within functional limits for tasks assessed  POSTURE: rounded shoulders and increased thoracic kyphosis  PALPATION: Shortened suboccipitals, tender points in bil upper traps   CERVICAL ROM:   Active ROM A/PROM (deg) eval  Flexion 25  Extension 15  Right lateral flexion 15  Left lateral flexion 15  Right rotation 20  Left rotation 17   (Blank rows = not tested)  UPPER EXTREMITY ROM: limited elevation (flexion and abduction) bil 150 degrees   UPPER EXTREMITY MMT: grossly 4/5  CERVICAL SPECIAL TESTS:  No change with  cervical distraction in sitting or supine; good response to passive cervical rotation and sidebending      FUNCTIONAL TESTS:  Able to rise from chair without UE assist but difficult  5x STS 15.68 no hands TUG 8.70 sec Pt defers 6 MWT-feels he's not up to that today, even a 2 MWT  TREATMENT DATE: 05/09/24 evaluation      Initial HEP as below                                                                                                                            PATIENT EDUCATION:  Education details: Educated patient on anatomy and physiology of current symptoms, prognosis, plan of care as well as initial self care strategies to promote recovery Person educated: Patient Education method: Explanation Education comprehension: verbalized understanding  HOME EXERCISE PROGRAM: Access Code: [email or web address] URL: [email or web address] Date: 05/09/2024 Prepared by: Glade Pesa  Exercises - Seated Cervical Sidebending Stretch  - 1 x daily - 7 x weekly - 1 sets - 3 reps - 20 hold - Seated Cervical Rotation Stretch  - 1 x daily - 7 x weekly - 1 sets - 3 reps - 20 hold - Seated Scapular Retraction  - 1 x daily - 7 x weekly - 1 sets - 10 reps - Sit to Stand Without Arm Support  - 1 x daily - 7 x weekly - 1 sets -  5-10 reps  ASSESSMENT:  CLINICAL IMPRESSION: Patient is a 59 y.o. male who was seen today for physical therapy evaluation and treatment for cervical spondylosis and gait instability. His neck pain is on the left side and radiates to the left ear.  The patient would benefit from skilled PT to address decreased cervical range of motion, correct muscle strength asymmetries and weakness in deep cervical flexors and extensors, improve postural strength including and scapular retractor and depressors and address pain levels.  All affect patient's ability to perform  ADLS including reading, doing housework and performing recreational activities. He would also benefit from PT to address  balance deficits particularly when wearing less supportive shoes (he prefers slides secondary to hammer toes but his balance is better with strap on shoes). He denies falls but some close calls mainly in the posterior direction.       OBJECTIVE IMPAIRMENTS: decreased balance, decreased ROM, decreased strength, increased fascial restrictions, and pain.   ACTIVITY LIMITATIONS: reach over head, hygiene/grooming, and locomotion level  PARTICIPATION LIMITATIONS: meal prep, cleaning, laundry, shopping, community activity, and church  PERSONAL FACTORS: Time since onset of injury/illness/exacerbation and 3+ comorbidities: psoriatic arthritis, hammer toes, bil THR are also affecting patient's functional outcome.   REHAB POTENTIAL: Good  CLINICAL DECISION MAKING: Stable/uncomplicated  EVALUATION COMPLEXITY: Low   GOALS: Goals reviewed with patient? Yes  SHORT TERM GOALS: Target date: 06/06/2024    The patient will demonstrate knowledge of basic self care strategies and exercises to promote healing   Baseline:  Goal status: INITIAL  2.  The patient will report a 30% improvement in pain levels with functional activities which are currently difficult including looking down at his phone, looking down to wash dishes Baseline:  Goal status: INITIAL  3.  Cervical ROM for rotation improved to 25 degrees needed for driving Baseline:  Goal status: INITIAL  4.  Improved balance with rising as indicated by 5x STS in 13 sec Baseline:  Goal status: INITIAL     LONG TERM GOALS: Target date: 07/04/2024    The patient will be independent in a safe self progression of a home exercise program to promote further recovery of function  Baseline:  Goal status: INITIAL  2.  The patient will report a 70% improvement in pain levels with functional activities which are currently difficult including looking down at his phone, looking down to wash dishes Baseline:  Goal status: INITIAL  3.  Cervical ROM  for rotation improved to 35 degrees needed for driving Baseline:  Goal status: INITIAL  4.  ABC scale score improved to 80% indicating improved confidence in his balance Baseline:  Goal status: INITIAL  5.  Neck Disability Index improved to 10% indicating improved function with less pain Baseline:  Goal status: INITIAL    PLAN:  PT FREQUENCY: 2x/week  PT DURATION: 8 weeks  PLANNED INTERVENTIONS: 02835- PT Re-evaluation, 97750- Physical Performance Testing, 97110-Therapeutic exercises, 97530- Therapeutic activity, 97112- Neuromuscular re-education, 97535- Self Care, 02859- Manual therapy, [phone]- Aquatic Therapy, [id number]- Electrical stimulation (unattended), [phone]- Electrical stimulation (manual), [id number]- Ultrasound, 79439 (1-2 muscles), [id number] (3+ muscles)- Dry Needling, Patient/Family education, Balance training, Taping, Joint mobilization, Spinal mobilization, Cryotherapy, and Moist heat  PLAN FOR NEXT SESSION: dynamic balance testing; manual therapy left > right cervical;  pt considering DN has had in the past/discussed extra cost;  DN to left upper trap, cervical multifidi and suboccipitals; cervical rotation SNAG with towel;Dynamic balance exercises(tends to lose balance in posterior direction)  Glade Pesa, PT 05/09/2024 7:02  PM Phone: [phone] Fax: [phone]         "

## 2024-05-16 ENCOUNTER — Ambulatory Visit: Admitting: Physical Therapy

## 2024-05-16 ENCOUNTER — Encounter: Payer: Self-pay | Admitting: Physical Therapy

## 2024-05-16 DIAGNOSIS — M5459 Other low back pain: Secondary | ICD-10-CM

## 2024-05-16 DIAGNOSIS — M542 Cervicalgia: Secondary | ICD-10-CM | POA: Diagnosis not present

## 2024-05-16 DIAGNOSIS — M6281 Muscle weakness (generalized): Secondary | ICD-10-CM

## 2024-05-16 DIAGNOSIS — R2681 Unsteadiness on feet: Secondary | ICD-10-CM

## 2024-05-16 NOTE — Therapy (Signed)
 " OUTPATIENT PHYSICAL THERAPY CERVICAL and BALANCE TREATMENT   Patient Name: [name] MRN: [id number] DOB:1966/04/27, 59 y.o., male Today's Date: 05/16/2024  END OF SESSION:  PT End of Session - 05/16/24 1059     Visit Number 2    Date for Recertification  07/04/24    Authorization Type [employer]    Authorization Time Period Pt signed DN paperwork for Jan 2026    Progress Note Due on Visit 10    PT Start Time 1100    PT Stop Time 1147    PT Time Calculation (min) 47 min    Activity Tolerance Patient tolerated treatment well    Behavior During Therapy [facility] for tasks assessed/performed           Past Medical History:  Diagnosis Date   Anemia    History of avascular necrosis of capital femoral epiphysis 2006, 2007   Bilateral Femoral Head   HLD (hyperlipidemia)    Pneumonia    twice   Psoriatic arthritis [facility])    Past Surgical History:  Procedure Laterality Date   CARPAL TUNNEL RELEASE Right 08/2022   JOINT REPLACEMENT Bilateral 2006, 2007   Necrosis femoral head (bilateral)   TOE SURGERY Left 10/02/2023   surgery to straighten toes on left foot   TOTAL HIP REVISION Right 11/23/2016   Procedure: Acetabulum liner and femoral head revision;  Surgeon: Melodi Lerner, MD;  Location: [facility];  Service: Orthopedics;  Laterality: Right;   TOTAL HIP REVISION Left 04/10/2019   Procedure: Left hip bearing surface vs total hip arthroplasty revision;  Surgeon: Melodi Lerner, MD;  Location: [facility];  Service: Orthopedics;  Laterality: Left;    Patient Active Problem List   Diagnosis Date Noted   Chest discomfort 01/26/2023   Wellness examination 09/19/2022   Low back pain 07/13/2022   Corns and callosities 01/15/2020   Hammer toes, bilateral 01/15/2020   Orthopnea 11/01/2019   History of COVID-19 07/30/2019   Acute deep vein thrombosis (DVT) of left lower extremity (HCC) 07/30/2019   Abnormal CT of the chest 07/30/2019   Cough 07/30/2019   Leg DVT (deep venous  thromboembolism), acute, bilateral (HCC) 07/24/2019   Essential hypertension 07/24/2019   COVID-19 virus infection 07/20/2019   Acute respiratory failure with hypoxia (HCC) 07/20/2019   Acute pulmonary embolism (HCC) 07/19/2019   Essential tremor 04/03/2017   Failed total hip arthroplasty 11/23/2016   High risk medications (not anticoagulants) long-term use 09/20/2016   Bilateral hand pain 09/20/2016   DDD (degenerative disc disease), cervical 09/20/2016   History of total hip replacement, bilateral 09/20/2016   DDD (degenerative disc disease), lumbar 09/20/2016   Bilateral plantar fasciitis 09/20/2016   Psoriasis 09/20/2016   Primary osteoarthritis of both feet 09/20/2016   Primary osteoarthritis of both hands 09/20/2016   Primary osteoarthritis of both knees 09/20/2016   Prediabetes 09/22/2014   Hyperlipidemia 09/22/2014   URI (upper respiratory infection) 07/07/2012   Acute bronchitis 07/07/2012   OBESITY 07/03/2008   AVASCULAR NECROSIS, FEMORAL HEAD 07/03/2008   Psoriatic arthritis (HCC) 05/02/2001    PCP: Hughie Sharper MD  REFERRING PROVIDER: Hughie Sharper MD  REFERRING DIAG: cervical spondylosis and stenosis; gait instability  THERAPY DIAG:  Cervicalgia  Gait instability  Muscle weakness (generalized)  Other low back pain  Rationale for Evaluation and Treatment: Rehabilitation  ONSET DATE: worsening in the last few weeks  SUBJECTIVE:  SUBJECTIVE STATEMENT: Pain was bad this morning but is better now.  I'm doing the stretches.  I can do DN but not often due to cost.  No falls since last here.  Eval: Per MD note: myofascial neck pain, consider dry needling, balance retraining.   Patient complains of left neck pain and ear pain. Present for a long time but worsened  over the last few weeks. Also states his balance is bad.  I had hammer toe surgery and that throws me backward when I wear my slides. My good shoes (wearing today) is better for balance but hurts my toes on the side I haven't had surgery on.   No falls.  When rising from a chair may fall back.  My neck pain has increased since I saw Dr. Hughie.    Saw Camie Rash for PT at [facility] for neck pain years ago and it helped.  Had DN.   I go to the gym and do weights mostly upper body 2x/week   Hand dominance: Right  PERTINENT HISTORY:  Chronic back pain  Bil THR revisions 2018 and 2020 Psoriatic arthritis and OA June 2025 multiple toe surgery with DVT  PAIN:   Are you having pain? Yes NPRS scale: 3/10 Pain location: left ear pain; left neck  Pain orientation: Left  PAIN TYPE: aching Pain description: intermittent  Aggravating factors: looking down at my phone, looking down to wash dishes; wearing slide shoes affects balance (falls backward); rising from chair sometimes falls back Relieving factors: feels good to turn head or look up; wearing supportive orthopedic shoes makes my balance better   PRECAUTIONS: Fall   WEIGHT BEARING RESTRICTIONS: No  FALLS:  Has patient fallen in last 6 months? No  OCCUPATION: on disability   PLOF: Independent with basic ADLs  PATIENT GOALS: less pain and more mobility    OBJECTIVE:  Note: Objective measures were completed at Evaluation unless otherwise noted.   PATIENT SURVEYS:  ABC scale: The Activities-Specific Balance Confidence (ABC) Scale 0% 10 20 30  40 50 60 70 80 90 100% No confidence<->completely confident  How confident are you that you will not lose your balance or become unsteady when you . . .   Date tested   05/09/24 Pt rated these as if wearing his slides since he spends a lot of time wearing these  Walk around the house 80%  2. Walk up or down stairs 90%  3. Bend over and pick up a slipper from in front of a closet  floor 90%  4. Reach for a small can off a shelf at eye level 90%  5. Stand on tip toes and reach for something above your head 70%  6. Stand on a chair and reach for something 80%  7. Sweep the floor 80%  8. Walk outside the house to a car parked in the driveway 90%  9. Get into or out of a car 90%  10. Walk across a parking lot to the mall 70%  11. Walk up or down a ramp 70%  12. Walk in a crowded mall where people rapidly walk past you 80%  13. Are bumped into by people as you walk through the mall 70%  14. Step onto or off of an escalator while you are holding onto the railing 70%  15. Step onto or off an escalator while holding onto parcels such that you cannot hold onto the railing 60%  16. Walk outside on icy sidewalks 50%  Total: #/16  76.8    NDI:  NECK DISABILITY INDEX  Date: 1/8 Score  Pain intensity 1 = The pain is very mild at the moment  2. Personal care (washing, dressing, etc.) 1 =  I can look after myself normally but it causes extra pain  3. Lifting 0 =  I can lift heavy weights without extra pain  4. Reading 2 =  I can read as much as I want with moderate pain in my neck  5. Headaches 0 = I have no headaches at all  6. Concentration 0 =  I can concentrate fully when I want to with no difficulty  7. Work 0 =  I can do as much work as I want to  8. Driving 2 =  I can drive my car as long as I want with moderate pain in my neck  9. Sleeping 2 = My sleep is mildly disturbed (1-2 hrs sleepless)  10. Recreation 2 = I am able to engage in most, but not all of my usual recreation activities because of   pain in my neck  Total 16%   Minimum Detectable Change (90% confidence): 5 points or 10% points  COGNITION: Overall cognitive status: Within functional limits for tasks assessed  POSTURE: rounded shoulders and increased thoracic kyphosis  PALPATION: Shortened suboccipitals, tender points in bil upper traps   CERVICAL ROM:   Active ROM A/PROM (deg) eval  Flexion 25   Extension 15  Right lateral flexion 15  Left lateral flexion 15  Right rotation 20  Left rotation 17   (Blank rows = not tested)  UPPER EXTREMITY ROM: limited elevation (flexion and abduction) bil 150 degrees   UPPER EXTREMITY MMT: grossly 4/5  CERVICAL SPECIAL TESTS:  No change with cervical distraction in sitting or supine; good response to passive cervical rotation and sidebending      FUNCTIONAL TESTS:  Able to rise from chair without UE assist but difficult  5x STS 15.68 no hands TUG 8.70 sec Pt defers 6 MWT-feels he's not up to that today, even a 2 MWT  TREATMENT DATE:  05/16/24: Seated neck SB stretch with overpressure - very limited Rt SB motion, 2x30 SNAG with towel for Rt Rot and Lt Rot 5x10 each (HEP) Seated scapular squeezes x5 Seated bil row green tband x10 (HEP) Trigger Point Dry Needling  Initial Treatment: Pt instructed on Dry Needling rational, procedures, and possible side effects. Pt instructed to expect mild to moderate muscle soreness later in the day and/or into the next day.  Pt instructed in methods to reduce muscle soreness. Pt instructed to continue prescribed HEP. Patient was educated on signs and symptoms of infection and other risk factors and advised to seek medical attention should they occur.  Patient verbalized understanding of these instructions and education.   Patient Verbal Consent Given: Yes Education Handout Provided: Previously Provided Muscles Treated: bil upper traps, bil SO, bil cervical multifidi Electrical Stimulation Performed: No Treatment Response/Outcome: signif twitch bil upper traps and deep multifidi on Lt  STM to Lt SCM in prone and to dry needling target muscles Gave green band and updated HEP  05/09/24 evaluation      Initial HEP as below  PATIENT EDUCATION:  Education details: Educated  patient on anatomy and physiology of current symptoms, prognosis, plan of care as well as initial self care strategies to promote recovery Person educated: Patient Education method: Explanation Education comprehension: verbalized understanding  HOME EXERCISE PROGRAM: Access Code: [email or web address] URL: [email or web address] Date: 05/16/2024 Prepared by: Orvil Azalyn Sliwa  Exercises - Seated Cervical Sidebending Stretch  - 1 x daily - 7 x weekly - 1 sets - 3 reps - 20 hold - Seated Cervical Rotation Stretch  - 1 x daily - 7 x weekly - 1 sets - 3 reps - 20 hold - Seated Scapular Retraction  - 1 x daily - 7 x weekly - 1 sets - 10 reps - Sit to Stand Without Arm Support  - 1 x daily - 7 x weekly - 1 sets - 5-10 reps - Seated Assisted Cervical Rotation with Towel  - 1 x daily - 7 x weekly - 1 sets - 5 reps - 5 hold - Seated Shoulder Row with Anchored Resistance  - 1 x daily - 7 x weekly - 1 sets - 10 reps  ASSESSMENT:  CLINICAL IMPRESSION: Pt is very stiff overall.  He has extremely limited neck ROM with combination of tissue and joint restrictions.  Added SNAG rotation with towel today and seated green bil row to HEP.  Pt agreed to DN (he will be able to do but not often due to cost) and had good response with signif twitch and palpable release of all target muscles.  He is also very tight along Lt SCM which may be contributing to his ear pain/fullness feeling.    Eval:Patient is a 59 y.o. male who was seen today for physical therapy evaluation and treatment for cervical spondylosis and gait instability. His neck pain is on the left side and radiates to the left ear.  The patient would benefit from skilled PT to address decreased cervical range of motion, correct muscle strength asymmetries and weakness in deep cervical flexors and extensors, improve postural strength including and scapular retractor and depressors and address pain levels.  All affect patient's ability to perform  ADLS  including reading, doing housework and performing recreational activities. He would also benefit from PT to address balance deficits particularly when wearing less supportive shoes (he prefers slides secondary to hammer toes but his balance is better with strap on shoes). He denies falls but some close calls mainly in the posterior direction.       OBJECTIVE IMPAIRMENTS: decreased balance, decreased ROM, decreased strength, increased fascial restrictions, and pain.   ACTIVITY LIMITATIONS: reach over head, hygiene/grooming, and locomotion level  PARTICIPATION LIMITATIONS: meal prep, cleaning, laundry, shopping, community activity, and church  PERSONAL FACTORS: Time since onset of injury/illness/exacerbation and 3+ comorbidities: psoriatic arthritis, hammer toes, bil THR are also affecting patient's functional outcome.   REHAB POTENTIAL: Good  CLINICAL DECISION MAKING: Stable/uncomplicated  EVALUATION COMPLEXITY: Low   GOALS: Goals reviewed with patient? Yes  SHORT TERM GOALS: Target date: 06/06/2024    The patient will demonstrate knowledge of basic self care strategies and exercises to promote healing   Baseline:  Goal status: INITIAL  2.  The patient will report a 30% improvement in pain levels with functional activities which are currently difficult including looking down at his phone, looking down to wash dishes Baseline:  Goal status: INITIAL  3.  Cervical ROM for rotation improved to 25 degrees needed for driving Baseline:  Goal status: INITIAL  4.  Improved balance with rising as  indicated by 5x STS in 13 sec Baseline:  Goal status: INITIAL     LONG TERM GOALS: Target date: 07/04/2024    The patient will be independent in a safe self progression of a home exercise program to promote further recovery of function  Baseline:  Goal status: INITIAL  2.  The patient will report a 70% improvement in pain levels with functional activities which are currently difficult  including looking down at his phone, looking down to wash dishes Baseline:  Goal status: INITIAL  3.  Cervical ROM for rotation improved to 35 degrees needed for driving Baseline:  Goal status: INITIAL  4.  ABC scale score improved to 80% indicating improved confidence in his balance Baseline:  Goal status: INITIAL  5.  Neck Disability Index improved to 10% indicating improved function with less pain Baseline:  Goal status: INITIAL    PLAN:  PT FREQUENCY: 2x/week  PT DURATION: 8 weeks  PLANNED INTERVENTIONS: 02835- PT Re-evaluation, 97750- Physical Performance Testing, 97110-Therapeutic exercises, 97530- Therapeutic activity, 97112- Neuromuscular re-education, 97535- Self Care, 02859- Manual therapy, [phone]- Aquatic Therapy, [id number]- Electrical stimulation (unattended), [phone]- Electrical stimulation (manual), [id number]- Ultrasound, 79439 (1-2 muscles), 20561 (3+ muscles)- Dry Needling, Patient/Family education, Balance training, Taping, Joint mobilization, Spinal mobilization, Cryotherapy, and Moist heat  PLAN FOR NEXT SESSION: f/u on DN, review and progress HEP, encourage trunk and head/neck ROM, dynamic balance testing; manual therapy left > right cervical;  pt considering DN has had in the past/discussed extra cost;  DN to left upper trap, cervical multifidi and suboccipitals; cervical rotation SNAG with towel;Dynamic balance exercises(tends to lose balance in posterior direction)  Reshard Guillet, PT 05/16/24 11:48 AM  Phone: [phone] Fax: [phone]         "

## 2024-05-21 ENCOUNTER — Ambulatory Visit

## 2024-05-21 DIAGNOSIS — R262 Difficulty in walking, not elsewhere classified: Secondary | ICD-10-CM

## 2024-05-21 DIAGNOSIS — M5459 Other low back pain: Secondary | ICD-10-CM

## 2024-05-21 DIAGNOSIS — R293 Abnormal posture: Secondary | ICD-10-CM

## 2024-05-21 DIAGNOSIS — R2681 Unsteadiness on feet: Secondary | ICD-10-CM

## 2024-05-21 DIAGNOSIS — M6281 Muscle weakness (generalized): Secondary | ICD-10-CM

## 2024-05-21 DIAGNOSIS — R252 Cramp and spasm: Secondary | ICD-10-CM

## 2024-05-21 DIAGNOSIS — M542 Cervicalgia: Secondary | ICD-10-CM | POA: Diagnosis not present

## 2024-05-21 NOTE — Therapy (Signed)
 " OUTPATIENT PHYSICAL THERAPY CERVICAL and BALANCE TREATMENT   Patient Name: [name] MRN: [id number] DOB:June 12, 1965, 59 y.o., male Today's Date: 05/21/2024  END OF SESSION:  PT End of Session - 05/21/24 1021     Visit Number 3    Date for Recertification  07/04/24    Authorization Type [employer]    Authorization Time Period Pt signed DN paperwork for Jan 2026    Progress Note Due on Visit 10    PT Start Time 1021    PT Stop Time 1103    PT Time Calculation (min) 42 min    Activity Tolerance Patient tolerated treatment well    Behavior During Therapy [facility] for tasks assessed/performed           Past Medical History:  Diagnosis Date   Anemia    History of avascular necrosis of capital femoral epiphysis 2006, 2007   Bilateral Femoral Head   HLD (hyperlipidemia)    Pneumonia    twice   Psoriatic arthritis [facility])    Past Surgical History:  Procedure Laterality Date   CARPAL TUNNEL RELEASE Right 08/2022   JOINT REPLACEMENT Bilateral 2006, 2007   Necrosis femoral head (bilateral)   TOE SURGERY Left 10/02/2023   surgery to straighten toes on left foot   TOTAL HIP REVISION Right 11/23/2016   Procedure: Acetabulum liner and femoral head revision;  Surgeon: Melodi Lerner, MD;  Location: [facility];  Service: Orthopedics;  Laterality: Right;   TOTAL HIP REVISION Left 04/10/2019   Procedure: Left hip bearing surface vs total hip arthroplasty revision;  Surgeon: Melodi Lerner, MD;  Location: [facility];  Service: Orthopedics;  Laterality: Left;    Patient Active Problem List   Diagnosis Date Noted   Chest discomfort 01/26/2023   Wellness examination 09/19/2022   Low back pain 07/13/2022   Corns and callosities 01/15/2020   Hammer toes, bilateral 01/15/2020   Orthopnea 11/01/2019   History of COVID-19 07/30/2019   Acute deep vein thrombosis (DVT) of left lower extremity (HCC) 07/30/2019   Abnormal CT of the chest 07/30/2019   Cough 07/30/2019   Leg DVT (deep venous  thromboembolism), acute, bilateral (HCC) 07/24/2019   Essential hypertension 07/24/2019   COVID-19 virus infection 07/20/2019   Acute respiratory failure with hypoxia (HCC) 07/20/2019   Acute pulmonary embolism (HCC) 07/19/2019   Essential tremor 04/03/2017   Failed total hip arthroplasty 11/23/2016   High risk medications (not anticoagulants) long-term use 09/20/2016   Bilateral hand pain 09/20/2016   DDD (degenerative disc disease), cervical 09/20/2016   History of total hip replacement, bilateral 09/20/2016   DDD (degenerative disc disease), lumbar 09/20/2016   Bilateral plantar fasciitis 09/20/2016   Psoriasis 09/20/2016   Primary osteoarthritis of both feet 09/20/2016   Primary osteoarthritis of both hands 09/20/2016   Primary osteoarthritis of both knees 09/20/2016   Prediabetes 09/22/2014   Hyperlipidemia 09/22/2014   URI (upper respiratory infection) 07/07/2012   Acute bronchitis 07/07/2012   OBESITY 07/03/2008   AVASCULAR NECROSIS, FEMORAL HEAD 07/03/2008   Psoriatic arthritis (HCC) 05/02/2001    PCP: Hughie Sharper MD  REFERRING PROVIDER: Hughie Sharper MD  REFERRING DIAG: cervical spondylosis and stenosis; gait instability  THERAPY DIAG:  Cervicalgia  Muscle weakness (generalized)  Gait instability  Cramp and spasm  Difficulty in walking, not elsewhere classified  Abnormal posture  Other low back pain  Rationale for Evaluation and Treatment: Rehabilitation  ONSET DATE: worsening in the last few weeks  SUBJECTIVE:  SUBJECTIVE STATEMENT: Patient reports that he was a little sore from the needling.  I don't feel a whole lot better.  I'm still hurting.  Patient appears quite rigid and demonstrates mild tremors.  Very little c spine ROM.     Eval: Per MD  note: myofascial neck pain, consider dry needling, balance retraining.   Patient complains of left neck pain and ear pain. Present for a long time but worsened over the last few weeks. Also states his balance is bad.  I had hammer toe surgery and that throws me backward when I wear my slides. My good shoes (wearing today) is better for balance but hurts my toes on the side I haven't had surgery on.   No falls.  When rising from a chair may fall back.  My neck pain has increased since I saw Dr. Hughie.    Saw Camie Rash for PT at [facility] for neck pain years ago and it helped.  Had DN.   I go to the gym and do weights mostly upper body 2x/week   Hand dominance: Right  PERTINENT HISTORY:  Chronic back pain  Bil THR revisions 2018 and 2020 Psoriatic arthritis and OA June 2025 multiple toe surgery with DVT  PAIN:  05/21/24 Are you having pain? Yes NPRS scale: 3/10 Pain location: left ear pain; left neck  Pain orientation: Left  PAIN TYPE: aching Pain description: intermittent  Aggravating factors: looking down at my phone, looking down to wash dishes; wearing slide shoes affects balance (falls backward); rising from chair sometimes falls back Relieving factors: feels good to turn head or look up; wearing supportive orthopedic shoes makes my balance better   PRECAUTIONS: Fall   WEIGHT BEARING RESTRICTIONS: No  FALLS:  Has patient fallen in last 6 months? No  OCCUPATION: on disability   PLOF: Independent with basic ADLs  PATIENT GOALS: less pain and more mobility    OBJECTIVE:  Note: Objective measures were completed at Evaluation unless otherwise noted.   PATIENT SURVEYS:  ABC scale: The Activities-Specific Balance Confidence (ABC) Scale 0% 10 20 30  40 50 60 70 80 90 100% No confidence<->completely confident  How confident are you that you will not lose your balance or become unsteady when you . . .   Date tested   05/09/24 Pt rated these as if wearing his slides  since he spends a lot of time wearing these  Walk around the house 80%  2. Walk up or down stairs 90%  3. Bend over and pick up a slipper from in front of a closet floor 90%  4. Reach for a small can off a shelf at eye level 90%  5. Stand on tip toes and reach for something above your head 70%  6. Stand on a chair and reach for something 80%  7. Sweep the floor 80%  8. Walk outside the house to a car parked in the driveway 90%  9. Get into or out of a car 90%  10. Walk across a parking lot to the mall 70%  11. Walk up or down a ramp 70%  12. Walk in a crowded mall where people rapidly walk past you 80%  13. Are bumped into by people as you walk through the mall 70%  14. Step onto or off of an escalator while you are holding onto the railing 70%  15. Step onto or off an escalator while holding onto parcels such that you cannot hold onto the railing 60%  16. Walk outside on icy sidewalks 50%  Total: #/16 76.8    NDI:  NECK DISABILITY INDEX  Date: 1/8 Score  Pain intensity 1 = The pain is very mild at the moment  2. Personal care (washing, dressing, etc.) 1 =  I can look after myself normally but it causes extra pain  3. Lifting 0 =  I can lift heavy weights without extra pain  4. Reading 2 =  I can read as much as I want with moderate pain in my neck  5. Headaches 0 = I have no headaches at all  6. Concentration 0 =  I can concentrate fully when I want to with no difficulty  7. Work 0 =  I can do as much work as I want to  8. Driving 2 =  I can drive my car as long as I want with moderate pain in my neck  9. Sleeping 2 = My sleep is mildly disturbed (1-2 hrs sleepless)  10. Recreation 2 = I am able to engage in most, but not all of my usual recreation activities because of   pain in my neck  Total 16%   Minimum Detectable Change (90% confidence): 5 points or 10% points  COGNITION: Overall cognitive status: Within functional limits for tasks assessed  POSTURE: rounded shoulders  and increased thoracic kyphosis  PALPATION: Shortened suboccipitals, tender points in bil upper traps   CERVICAL ROM:   Active ROM A/PROM (deg) eval  Flexion 25  Extension 15  Right lateral flexion 15  Left lateral flexion 15  Right rotation 20  Left rotation 17   (Blank rows = not tested)  UPPER EXTREMITY ROM: limited elevation (flexion and abduction) bil 150 degrees   UPPER EXTREMITY MMT: grossly 4/5  CERVICAL SPECIAL TESTS:  No change with cervical distraction in sitting or supine; good response to passive cervical rotation and sidebending      FUNCTIONAL TESTS:  Able to rise from chair without UE assist but difficult  5x STS 15.68 no hands TUG 8.70 sec Pt defers 6 MWT-feels he's not up to that today, even a 2 MWT  TREATMENT DATE:  05/21/24: (patient got appt times mixed up and had to see different PT) Sat and discussed progress, observed, palpated bilateral upper traps (left upper trap extremely tight) Fwd bent on counter: left shoulder extension, rows and horizontal abduction with 2 lbs 2 x 10 each (left) Side lying left shoulder ER 2 x 10 with 2 lbs Supine serratus punch with 2 lbs left 2 x 10 PROM C spine : all planes of motion with upper trap and levator stretch bilaterally,  STM to bilateral upper traps and levator Chin tucks with PT supporting head x 10 holding 2-3 seconds Education on natural spinal fusion and the connections between the left shoulder weakness and the left upper trap being extremely tight.  Explained that, with the amount of arthritis and natural fusion, his goal would be avoiding progression of the condition and working on correcting posture and shoulder health.    05/16/24: Seated neck SB stretch with overpressure - very limited Rt SB motion, 2x30 SNAG with towel for Rt Rot and Lt Rot 5x10 each (HEP) Seated scapular squeezes x5 Seated bil row green tband x10 (HEP) Trigger Point Dry Needling  Initial Treatment: Pt instructed on Dry  Needling rational, procedures, and possible side effects. Pt instructed to expect mild to moderate muscle soreness later in the day and/or into the next day.  Pt  instructed in methods to reduce muscle soreness. Pt instructed to continue prescribed HEP. Patient was educated on signs and symptoms of infection and other risk factors and advised to seek medical attention should they occur.  Patient verbalized understanding of these instructions and education.   Patient Verbal Consent Given: Yes Education Handout Provided: Previously Provided Muscles Treated: bil upper traps, bil SO, bil cervical multifidi Electrical Stimulation Performed: No Treatment Response/Outcome: signif twitch bil upper traps and deep multifidi on Lt  STM to Lt SCM in prone and to dry needling target muscles Gave green band and updated HEP  05/09/24 evaluation      Initial HEP as below                                                                                                                            PATIENT EDUCATION:  Education details: Educated patient on anatomy and physiology of current symptoms, prognosis, plan of care as well as initial self care strategies to promote recovery Person educated: Patient Education method: Explanation Education comprehension: verbalized understanding  HOME EXERCISE PROGRAM: Access Code: [email or web address] URL: [email or web address] Date: 05/21/2024 Prepared by: Delon Haddock  Exercises - Seated Cervical Sidebending Stretch  - 1 x daily - 7 x weekly - 1 sets - 3 reps - 20 hold - Seated Cervical Rotation Stretch  - 1 x daily - 7 x weekly - 1 sets - 3 reps - 20 hold - Seated Scapular Retraction  - 1 x daily - 7 x weekly - 1 sets - 10 reps - Sit to Stand Without Arm Support  - 1 x daily - 7 x weekly - 1 sets - 5-10 reps - Seated Assisted Cervical Rotation with Towel  - 1 x daily - 7 x weekly - 1 sets - 5 reps - 5 hold - Seated Shoulder Row with Anchored Resistance  -  1 x daily - 7 x weekly - 1 sets - 10 reps - Prone Shoulder Extension - Single Arm  - 2 x daily - 7 x weekly - 2 sets - 10 reps - Prone Shoulder Row  - 2 x daily - 7 x weekly - 2 sets - 10 reps - Prone Single Arm Shoulder Horizontal Abduction with Scapular Retraction and Palm Down  - 2 x daily - 7 x weekly - 2 sets - 10 reps - Sidelying Shoulder External Rotation  - 2 x daily - 7 x weekly - 2 sets - 10 reps - Single Arm Serratus Punches in Supine with Dumbbell  - 2 x daily - 7 x weekly - 2 sets - 10 reps - Standing Shoulder Flexion to 90 Degrees with Dumbbells  - 2 x daily - 7 x weekly - 2 sets - 10 reps - Standing Shoulder Scaption  - 2 x daily - 7 x weekly - 2 sets - 10 reps ASSESSMENT:  CLINICAL IMPRESSION: [place] continues to have severe ROM limitations and rigidity.  He was able to  do the new left shoulder exercises with 2 lbs with no increased pain.  His left upper trap was extremely tight vs right indicating possible left rotator cuff weakness.  We attempted cervical retraction in supine.  He was able to do this with his head supported in quite a bit of flexion.  He fwd head and left SCM tightness restrict his motion significantly and he is likely naturally fused on most levels of the c spine.  He would benefit from skilled PT to address postural weakness and left shoulder weakness.    Eval:Patient is a 59 y.o. male who was seen today for physical therapy evaluation and treatment for cervical spondylosis and gait instability. His neck pain is on the left side and radiates to the left ear.  The patient would benefit from skilled PT to address decreased cervical range of motion, correct muscle strength asymmetries and weakness in deep cervical flexors and extensors, improve postural strength including and scapular retractor and depressors and address pain levels.  All affect patient's ability to perform  ADLS including reading, doing housework and performing recreational activities. He would also  benefit from PT to address balance deficits particularly when wearing less supportive shoes (he prefers slides secondary to hammer toes but his balance is better with strap on shoes). He denies falls but some close calls mainly in the posterior direction.       OBJECTIVE IMPAIRMENTS: decreased balance, decreased ROM, decreased strength, increased fascial restrictions, and pain.   ACTIVITY LIMITATIONS: reach over head, hygiene/grooming, and locomotion level  PARTICIPATION LIMITATIONS: meal prep, cleaning, laundry, shopping, community activity, and church  PERSONAL FACTORS: Time since onset of injury/illness/exacerbation and 3+ comorbidities: psoriatic arthritis, hammer toes, bil THR are also affecting patient's functional outcome.   REHAB POTENTIAL: Good  CLINICAL DECISION MAKING: Stable/uncomplicated  EVALUATION COMPLEXITY: Low   GOALS: Goals reviewed with patient? Yes  SHORT TERM GOALS: Target date: 06/06/2024    The patient will demonstrate knowledge of basic self care strategies and exercises to promote healing   Baseline:  Goal status: INITIAL  2.  The patient will report a 30% improvement in pain levels with functional activities which are currently difficult including looking down at his phone, looking down to wash dishes Baseline:  Goal status: INITIAL  3.  Cervical ROM for rotation improved to 25 degrees needed for driving Baseline:  Goal status: INITIAL  4.  Improved balance with rising as indicated by 5x STS in 13 sec Baseline:  Goal status: INITIAL     LONG TERM GOALS: Target date: 07/04/2024    The patient will be independent in a safe self progression of a home exercise program to promote further recovery of function  Baseline:  Goal status: INITIAL  2.  The patient will report a 70% improvement in pain levels with functional activities which are currently difficult including looking down at his phone, looking down to wash dishes Baseline:  Goal status:  INITIAL  3.  Cervical ROM for rotation improved to 35 degrees needed for driving Baseline:  Goal status: INITIAL  4.  ABC scale score improved to 80% indicating improved confidence in his balance Baseline:  Goal status: INITIAL  5.  Neck Disability Index improved to 10% indicating improved function with less pain Baseline:  Goal status: INITIAL    PLAN:  PT FREQUENCY: 2x/week  PT DURATION: 8 weeks  PLANNED INTERVENTIONS: 97164- PT Re-evaluation, 97750- Physical Performance Testing, 97110-Therapeutic exercises, 97530- Therapeutic activity, [id number]- Neuromuscular re-education, 97535- Self Care, [id number]- Manual  therapy, [id number]- Aquatic Therapy, [phone]- Electrical stimulation (unattended), [phone]- Electrical stimulation (manual), 02964- Ultrasound, [phone] (1-2 muscles), [id number] (3+ muscles)- Dry Needling, Patient/Family education, Balance training, Taping, Joint mobilization, Spinal mobilization, Cryotherapy, and Moist heat  PLAN FOR NEXT SESSION:  DN if indicated (DN to left upper trap, cervical multifidi and suboccipitals), review and progress HEP, encourage trunk and head/neck ROM, dynamic balance testing; manual therapy left > right cervical;    cervical rotation SNAG with towel;Dynamic balance exercises(tends to lose balance in posterior direction)  Goldie Tregoning B. Ayeisha Lindenberger, PT 05/21/24 11:27 AM [facility] [address], [address] [place], [place] [address] Phone # [phone] Fax [phone]          "

## 2024-05-23 ENCOUNTER — Ambulatory Visit: Admitting: Physical Therapy

## 2024-05-23 ENCOUNTER — Encounter: Payer: Self-pay | Admitting: Physical Therapy

## 2024-05-23 DIAGNOSIS — R262 Difficulty in walking, not elsewhere classified: Secondary | ICD-10-CM

## 2024-05-23 DIAGNOSIS — R252 Cramp and spasm: Secondary | ICD-10-CM

## 2024-05-23 DIAGNOSIS — M542 Cervicalgia: Secondary | ICD-10-CM | POA: Diagnosis not present

## 2024-05-23 DIAGNOSIS — R2681 Unsteadiness on feet: Secondary | ICD-10-CM

## 2024-05-23 DIAGNOSIS — M6281 Muscle weakness (generalized): Secondary | ICD-10-CM

## 2024-05-23 DIAGNOSIS — R293 Abnormal posture: Secondary | ICD-10-CM

## 2024-05-23 NOTE — Therapy (Signed)
 " OUTPATIENT PHYSICAL THERAPY CERVICAL and BALANCE TREATMENT   Patient Name: [name] MRN: [id number] DOB:1965-06-29, 59 y.o., male Today's Date: 05/23/2024  END OF SESSION:  PT End of Session - 05/23/24 1115     Visit Number 4    Date for Recertification  07/04/24    Authorization Type [employer]    Authorization Time Period Pt signed DN paperwork for Jan 2026    Progress Note Due on Visit 10    PT Start Time 1110    PT Stop Time 1150    PT Time Calculation (min) 40 min    Activity Tolerance Patient tolerated treatment well    Behavior During Therapy [facility] for tasks assessed/performed            Past Medical History:  Diagnosis Date   Anemia    History of avascular necrosis of capital femoral epiphysis 2006, 2007   Bilateral Femoral Head   HLD (hyperlipidemia)    Pneumonia    twice   Psoriatic arthritis [facility])    Past Surgical History:  Procedure Laterality Date   CARPAL TUNNEL RELEASE Right 08/2022   JOINT REPLACEMENT Bilateral 2006, 2007   Necrosis femoral head (bilateral)   TOE SURGERY Left 10/02/2023   surgery to straighten toes on left foot   TOTAL HIP REVISION Right 11/23/2016   Procedure: Acetabulum liner and femoral head revision;  Surgeon: Melodi Lerner, MD;  Location: [facility];  Service: Orthopedics;  Laterality: Right;   TOTAL HIP REVISION Left 04/10/2019   Procedure: Left hip bearing surface vs total hip arthroplasty revision;  Surgeon: Melodi Lerner, MD;  Location: [facility];  Service: Orthopedics;  Laterality: Left;    Patient Active Problem List   Diagnosis Date Noted   Chest discomfort 01/26/2023   Wellness examination 09/19/2022   Low back pain 07/13/2022   Corns and callosities 01/15/2020   Hammer toes, bilateral 01/15/2020   Orthopnea 11/01/2019   History of COVID-19 07/30/2019   Acute deep vein thrombosis (DVT) of left lower extremity (HCC) 07/30/2019   Abnormal CT of the chest 07/30/2019   Cough 07/30/2019   Leg DVT (deep  venous thromboembolism), acute, bilateral (HCC) 07/24/2019   Essential hypertension 07/24/2019   COVID-19 virus infection 07/20/2019   Acute respiratory failure with hypoxia (HCC) 07/20/2019   Acute pulmonary embolism (HCC) 07/19/2019   Essential tremor 04/03/2017   Failed total hip arthroplasty 11/23/2016   High risk medications (not anticoagulants) long-term use 09/20/2016   Bilateral hand pain 09/20/2016   DDD (degenerative disc disease), cervical 09/20/2016   History of total hip replacement, bilateral 09/20/2016   DDD (degenerative disc disease), lumbar 09/20/2016   Bilateral plantar fasciitis 09/20/2016   Psoriasis 09/20/2016   Primary osteoarthritis of both feet 09/20/2016   Primary osteoarthritis of both hands 09/20/2016   Primary osteoarthritis of both knees 09/20/2016   Prediabetes 09/22/2014   Hyperlipidemia 09/22/2014   URI (upper respiratory infection) 07/07/2012   Acute bronchitis 07/07/2012   OBESITY 07/03/2008   AVASCULAR NECROSIS, FEMORAL HEAD 07/03/2008   Psoriatic arthritis (HCC) 05/02/2001    PCP: Hughie Sharper MD  REFERRING PROVIDER: Hughie Sharper MD  REFERRING DIAG: cervical spondylosis and stenosis; gait instability  THERAPY DIAG:  Cervicalgia  Muscle weakness (generalized)  Gait instability  Cramp and spasm  Difficulty in walking, not elsewhere classified  Abnormal posture  Rationale for Evaluation and Treatment: Rehabilitation  ONSET DATE: worsening in the last few weeks  SUBJECTIVE:  SUBJECTIVE STATEMENT:  My neck locks up when I'm looking forward. It did it this morning for a couple of seconds. Pain 4-5/10 this morning.   Eval: Per MD note: myofascial neck pain, consider dry needling, balance retraining.   Patient complains of left neck  pain and ear pain. Present for a long time but worsened over the last few weeks. Also states his balance is bad.  I had hammer toe surgery and that throws me backward when I wear my slides. My good shoes (wearing today) is better for balance but hurts my toes on the side I haven't had surgery on.   No falls.  When rising from a chair may fall back.  My neck pain has increased since I saw Dr. Hughie.    Saw Camie Rash for PT at [facility] for neck pain years ago and it helped.  Had DN.   I go to the gym and do weights mostly upper body 2x/week   Hand dominance: Right  PERTINENT HISTORY:  Chronic back pain  Bil THR revisions 2018 and 2020 Psoriatic arthritis and OA June 2025 multiple toe surgery with DVT  PAIN:  05/23/2024  Are you having pain? Yes NPRS scale: 1-2/10 Pain location: left ear pain; left neck  Pain orientation: Left  PAIN TYPE: aching Pain description: intermittent  Aggravating factors: looking down at my phone, looking down to wash dishes; wearing slide shoes affects balance (falls backward); rising from chair sometimes falls back Relieving factors: feels good to turn head or look up; wearing supportive orthopedic shoes makes my balance better   PRECAUTIONS: Fall   WEIGHT BEARING RESTRICTIONS: No  FALLS:  Has patient fallen in last 6 months? No  OCCUPATION: on disability   PLOF: Independent with basic ADLs  PATIENT GOALS: less pain and more mobility    OBJECTIVE:  Note: Objective measures were completed at Evaluation unless otherwise noted.   PATIENT SURVEYS:  ABC scale: The Activities-Specific Balance Confidence (ABC) Scale 0% 10 20 30  40 50 60 70 80 90 100% No confidence<->completely confident  How confident are you that you will not lose your balance or become unsteady when you . . .   Date tested   05/09/24 Pt rated these as if wearing his slides since he spends a lot of time wearing these  Walk around the house 80%  2. Walk up or down stairs 90%   3. Bend over and pick up a slipper from in front of a closet floor 90%  4. Reach for a small can off a shelf at eye level 90%  5. Stand on tip toes and reach for something above your head 70%  6. Stand on a chair and reach for something 80%  7. Sweep the floor 80%  8. Walk outside the house to a car parked in the driveway 90%  9. Get into or out of a car 90%  10. Walk across a parking lot to the mall 70%  11. Walk up or down a ramp 70%  12. Walk in a crowded mall where people rapidly walk past you 80%  13. Are bumped into by people as you walk through the mall 70%  14. Step onto or off of an escalator while you are holding onto the railing 70%  15. Step onto or off an escalator while holding onto parcels such that you cannot hold onto the railing 60%  16. Walk outside on icy sidewalks 50%  Total: #/16 76.8    NDI:  NECK DISABILITY INDEX  Date: 1/8 Score  Pain intensity 1 = The pain is very mild at the moment  2. Personal care (washing, dressing, etc.) 1 =  I can look after myself normally but it causes extra pain  3. Lifting 0 =  I can lift heavy weights without extra pain  4. Reading 2 =  I can read as much as I want with moderate pain in my neck  5. Headaches 0 = I have no headaches at all  6. Concentration 0 =  I can concentrate fully when I want to with no difficulty  7. Work 0 =  I can do as much work as I want to  8. Driving 2 =  I can drive my car as long as I want with moderate pain in my neck  9. Sleeping 2 = My sleep is mildly disturbed (1-2 hrs sleepless)  10. Recreation 2 = I am able to engage in most, but not all of my usual recreation activities because of   pain in my neck  Total 16%   Minimum Detectable Change (90% confidence): 5 points or 10% points  COGNITION: Overall cognitive status: Within functional limits for tasks assessed  POSTURE: rounded shoulders and increased thoracic kyphosis  PALPATION: Shortened suboccipitals, tender points in bil upper  traps   CERVICAL ROM:   Active ROM A/PROM (deg) eval  Flexion 25  Extension 15  Right lateral flexion 15  Left lateral flexion 15  Right rotation 20  Left rotation 17   (Blank rows = not tested)  UPPER EXTREMITY ROM: limited elevation (flexion and abduction) bil 150 degrees   UPPER EXTREMITY MMT: grossly 4/5  CERVICAL SPECIAL TESTS:  No change with cervical distraction in sitting or supine; good response to passive cervical rotation and sidebending      FUNCTIONAL TESTS:  Able to rise from chair without UE assist but difficult  5x STS 15.68 no hands TUG 8.70 sec Pt defers 6 MWT-feels he's not up to that today, even a 2 MWT  TREATMENT DATE:  05/23/24 Fwd bent on counter: left shoulder extension, rows and horizontal abduction with 2 lbs 2 x 10 each (left) Side lying left shoulder ER 2 x 10 with 2lbs (did 3# for 5 reps and became too hard so returned to 2# with 5 sec hold) Supine serratus punch with 2 lbs left 2 x 10 Wall clocks blue loop x 5 B Heat to neck during exercises below: Seated neck extension x 10, rotation x 20 B Seated OH reach with ball following with eyes/head x 10 and chops x 10 B Seated Chin tucks with PT supporting upper back 2x 10 holding cued with slight nod and then retraction Toe taps 6 inch step x 10 B Church pews x 10  05/21/24: (patient got appt times mixed up and had to see different PT) Sat and discussed progress, observed, palpated bilateral upper traps (left upper trap extremely tight) Fwd bent on counter: left shoulder extension, rows and horizontal abduction with 2 lbs 2 x 10 each (left) Side lying left shoulder ER 2 x 10 with 2 lbs Supine serratus punch with 2 lbs left 2 x 10 PROM C spine : all planes of motion with upper trap and levator stretch bilaterally,  STM to bilateral upper traps and levator Chin tucks with PT supporting head x 10 holding 2-3 seconds Education on natural spinal fusion and the connections between the left shoulder  weakness and the left upper trap  being extremely tight.  Explained that, with the amount of arthritis and natural fusion, his goal would be avoiding progression of the condition and working on correcting posture and shoulder health.    05/16/24: Seated neck SB stretch with overpressure - very limited Rt SB motion, 2x30 SNAG with towel for Rt Rot and Lt Rot 5x10 each (HEP) Seated scapular squeezes x5 Seated bil row green tband x10 (HEP) Trigger Point Dry Needling  Initial Treatment: Pt instructed on Dry Needling rational, procedures, and possible side effects. Pt instructed to expect mild to moderate muscle soreness later in the day and/or into the next day.  Pt instructed in methods to reduce muscle soreness. Pt instructed to continue prescribed HEP. Patient was educated on signs and symptoms of infection and other risk factors and advised to seek medical attention should they occur.  Patient verbalized understanding of these instructions and education.   Patient Verbal Consent Given: Yes Education Handout Provided: Previously Provided Muscles Treated: bil upper traps, bil SO, bil cervical multifidi Electrical Stimulation Performed: No Treatment Response/Outcome: signif twitch bil upper traps and deep multifidi on Lt  STM to Lt SCM in prone and to dry needling target muscles Gave green band and updated HEP  05/09/24 evaluation      Initial HEP as below                                                                                                                            PATIENT EDUCATION:  Education details: Educated patient on anatomy and physiology of current symptoms, prognosis, plan of care as well as initial self care strategies to promote recovery Person educated: Patient Education method: Explanation Education comprehension: verbalized understanding  HOME EXERCISE PROGRAM: Access Code: [email or web address] URL: [email or web address] Date: 05/21/2024 Prepared by:  Delon Haddock  Exercises - Seated Cervical Sidebending Stretch  - 1 x daily - 7 x weekly - 1 sets - 3 reps - 20 hold - Seated Cervical Rotation Stretch  - 1 x daily - 7 x weekly - 1 sets - 3 reps - 20 hold - Seated Scapular Retraction  - 1 x daily - 7 x weekly - 1 sets - 10 reps - Sit to Stand Without Arm Support  - 1 x daily - 7 x weekly - 1 sets - 5-10 reps - Seated Assisted Cervical Rotation with Towel  - 1 x daily - 7 x weekly - 1 sets - 5 reps - 5 hold - Seated Shoulder Row with Anchored Resistance  - 1 x daily - 7 x weekly - 1 sets - 10 reps - Prone Shoulder Extension - Single Arm  - 2 x daily - 7 x weekly - 2 sets - 10 reps - Prone Shoulder Row  - 2 x daily - 7 x weekly - 2 sets - 10 reps - Prone Single Arm Shoulder Horizontal Abduction with Scapular Retraction and Palm Down  - 2 x daily - 7 x weekly - 2  sets - 10 reps - Sidelying Shoulder External Rotation  - 2 x daily - 7 x weekly - 2 sets - 10 reps - Single Arm Serratus Punches in Supine with Dumbbell  - 2 x daily - 7 x weekly - 2 sets - 10 reps - Standing Shoulder Flexion to 90 Degrees with Dumbbells  - 2 x daily - 7 x weekly - 2 sets - 10 reps - Standing Shoulder Scaption  - 2 x daily - 7 x weekly - 2 sets - 10 reps ASSESSMENT:  CLINICAL IMPRESSION: Bryndon continues to have severe ROM limitations and rigidity.  He was able to do the new left shoulder exercises with 2 lbs with no increased pain.  His left upper trap was extremely tight vs right indicating possible left rotator cuff weakness.  Able to do cervical retraction in sitting today with upper back support by PT.  Fatigues rather quickly with shoulder exercises. Initiated balance activities which he did well with, but legs also fatigue quickly. Good response to seated spinal mobility exercises using ball. Progress HEP next visit.   Eval:Patient is a 59 y.o. male who was seen today for physical therapy evaluation and treatment for cervical spondylosis and gait instability. His  neck pain is on the left side and radiates to the left ear.  The patient would benefit from skilled PT to address decreased cervical range of motion, correct muscle strength asymmetries and weakness in deep cervical flexors and extensors, improve postural strength including and scapular retractor and depressors and address pain levels.  All affect patient's ability to perform  ADLS including reading, doing housework and performing recreational activities. He would also benefit from PT to address balance deficits particularly when wearing less supportive shoes (he prefers slides secondary to hammer toes but his balance is better with strap on shoes). He denies falls but some close calls mainly in the posterior direction.       OBJECTIVE IMPAIRMENTS: decreased balance, decreased ROM, decreased strength, increased fascial restrictions, and pain.   ACTIVITY LIMITATIONS: reach over head, hygiene/grooming, and locomotion level  PARTICIPATION LIMITATIONS: meal prep, cleaning, laundry, shopping, community activity, and church  PERSONAL FACTORS: Time since onset of injury/illness/exacerbation and 3+ comorbidities: psoriatic arthritis, hammer toes, bil THR are also affecting patient's functional outcome.   REHAB POTENTIAL: Good  CLINICAL DECISION MAKING: Stable/uncomplicated  EVALUATION COMPLEXITY: Low   GOALS: Goals reviewed with patient? Yes  SHORT TERM GOALS: Target date: 06/06/2024    The patient will demonstrate knowledge of basic self care strategies and exercises to promote healing   Baseline:  Goal status: INITIAL  2.  The patient will report a 30% improvement in pain levels with functional activities which are currently difficult including looking down at his phone, looking down to wash dishes Baseline:  Goal status: INITIAL  3.  Cervical ROM for rotation improved to 25 degrees needed for driving Baseline:  Goal status: INITIAL  4.  Improved balance with rising as indicated by 5x STS  in 13 sec Baseline:  Goal status: INITIAL     LONG TERM GOALS: Target date: 07/04/2024    The patient will be independent in a safe self progression of a home exercise program to promote further recovery of function  Baseline:  Goal status: INITIAL  2.  The patient will report a 70% improvement in pain levels with functional activities which are currently difficult including looking down at his phone, looking down to wash dishes Baseline:  Goal status: INITIAL  3.  Cervical ROM for rotation improved to 35 degrees needed for driving Baseline:  Goal status: INITIAL  4.  ABC scale score improved to 80% indicating improved confidence in his balance Baseline:  Goal status: INITIAL  5.  Neck Disability Index improved to 10% indicating improved function with less pain Baseline:  Goal status: INITIAL    PLAN:  PT FREQUENCY: 2x/week  PT DURATION: 8 weeks  PLANNED INTERVENTIONS: 02835- PT Re-evaluation, 97750- Physical Performance Testing, 97110-Therapeutic exercises, 97530- Therapeutic activity, 97112- Neuromuscular re-education, 97535- Self Care, 02859- Manual therapy, [phone]- Aquatic Therapy, [id number]- Electrical stimulation (unattended), [phone]- Electrical stimulation (manual), [id number]- Ultrasound, 79439 (1-2 muscles), 20561 (3+ muscles)- Dry Needling, Patient/Family education, Balance training, Taping, Joint mobilization, Spinal mobilization, Cryotherapy, and Moist heat  PLAN FOR NEXT SESSION:  DN if indicated (DN to left upper trap, cervical multifidi and suboccipitals), review and progress HEP, encourage trunk and head/neck ROM, dynamic balance testing; manual therapy left > right cervical;    cervical rotation SNAG with towel;Dynamic balance exercises(tends to lose balance in posterior direction)  Mliss Cummins, PT  05/23/24 1:05 PM [facility] [address], [address] [place], [place] [address] Phone # [phone] Fax [phone]          "

## 2024-05-28 ENCOUNTER — Ambulatory Visit: Admitting: Physical Therapy

## 2024-05-30 ENCOUNTER — Other Ambulatory Visit: Payer: Self-pay

## 2024-05-30 ENCOUNTER — Ambulatory Visit: Admitting: Physical Therapy

## 2024-06-04 ENCOUNTER — Ambulatory Visit: Admitting: Physical Therapy

## 2024-06-06 ENCOUNTER — Ambulatory Visit: Admitting: Physical Therapy

## 2024-06-11 ENCOUNTER — Ambulatory Visit: Admitting: Physical Therapy

## 2024-06-13 ENCOUNTER — Ambulatory Visit: Admitting: Physical Therapy

## 2024-06-18 ENCOUNTER — Ambulatory Visit: Admitting: Physical Therapy

## 2024-06-20 ENCOUNTER — Ambulatory Visit: Admitting: Physical Therapy

## 2024-06-25 ENCOUNTER — Ambulatory Visit: Admitting: Physical Therapy

## 2024-06-27 ENCOUNTER — Ambulatory Visit: Admitting: Physical Therapy

## 2024-07-02 ENCOUNTER — Ambulatory Visit: Admitting: Physical Therapy

## 2024-07-04 ENCOUNTER — Ambulatory Visit: Admitting: Physical Therapy

## 2024-08-21 ENCOUNTER — Ambulatory Visit: Admitting: Rheumatology

## 2024-11-27 ENCOUNTER — Ambulatory Visit: Admitting: Rheumatology
